# Patient Record
Sex: Male | Born: 1955 | Race: Black or African American | Hispanic: No | Marital: Married | Smoking: Never smoker
Health system: Southern US, Community
[De-identification: ages and names within clinical notes are randomized; demographics above are authoritative.]

## PROBLEM LIST (undated history)

## (undated) DIAGNOSIS — I10 Essential (primary) hypertension: Secondary | ICD-10-CM

## (undated) DIAGNOSIS — I669 Occlusion and stenosis of unspecified cerebral artery: Secondary | ICD-10-CM

## (undated) DIAGNOSIS — C189 Malignant neoplasm of colon, unspecified: Secondary | ICD-10-CM

## (undated) HISTORY — PX: CORONARY ARTERY BYPASS GRAFT: SHX141

---

## 2010-07-20 ENCOUNTER — Emergency Department (HOSPITAL_COMMUNITY): Payer: Medicaid Other

## 2010-07-20 ENCOUNTER — Emergency Department (HOSPITAL_COMMUNITY)
Admission: EM | Admit: 2010-07-20 | Discharge: 2010-07-21 | Disposition: A | Discharge status: Nursing Home (Includes ICF) | Payer: Medicaid Other | Source: Home / Self Care | Attending: Emergency Medicine | Admitting: Emergency Medicine

## 2010-07-20 LAB — BASIC METABOLIC PANEL
CO2: 26 mEq/L (ref 19–32)
Calcium: 9.5 mg/dL (ref 8.4–10.5)
GFR calc Af Amer: 46 mL/min — ABNORMAL LOW (ref >60)
GFR calc non Af Amer: 38 mL/min — ABNORMAL LOW (ref >60)
Sodium: 130 mEq/L — ABNORMAL LOW (ref 135–145)

## 2010-07-20 LAB — CK TOTAL AND CKMB (NOT AT ARMC)
CK, MB: 3.3 ng/mL (ref 0.3–4.0)
Total CK: 505 U/L — ABNORMAL HIGH (ref 7–232)

## 2010-07-20 LAB — CBC
MCH: 27.6 pg (ref 26.0–34.0)
Platelets: 212 10*3/uL (ref 150–400)
RBC: 4.38 MIL/uL (ref 4.22–5.81)

## 2010-07-20 LAB — DIFFERENTIAL
Basophils Relative: 0 % (ref 0–1)
Eosinophils Absolute: 0 10*3/uL (ref 0.0–0.7)
Monocytes Relative: 16 % — ABNORMAL HIGH (ref 3–12)
Neutrophils Relative %: 74 % (ref 43–77)

## 2010-07-20 LAB — PRO B NATRIURETIC PEPTIDE: Pro B Natriuretic peptide (BNP): 4124 pg/mL — ABNORMAL HIGH (ref 0–125)

## 2010-07-20 MED ORDER — IOHEXOL 300 MG/ML  SOLN
100.0000 mL | Freq: Once | INTRAMUSCULAR | Status: AC | PRN
  Administered 2010-07-20: 100 mL via INTRAVENOUS

## 2010-07-21 ENCOUNTER — Inpatient Hospital Stay (HOSPITAL_COMMUNITY): Payer: Medicaid Other

## 2010-07-21 ENCOUNTER — Inpatient Hospital Stay (HOSPITAL_COMMUNITY)
Admission: AD | Admit: 2010-07-21 | Discharge: 2010-08-03 | DRG: 194 | Disposition: A | Discharge status: Home / Self Care | Payer: Medicaid Other | Source: Other Acute Inpatient Hospital | Attending: Internal Medicine | Admitting: Internal Medicine

## 2010-07-21 DIAGNOSIS — I712 Thoracic aortic aneurysm, without rupture, unspecified: Secondary | ICD-10-CM | POA: Diagnosis present

## 2010-07-21 DIAGNOSIS — N183 Chronic kidney disease, stage 3 unspecified: Secondary | ICD-10-CM | POA: Diagnosis present

## 2010-07-21 DIAGNOSIS — Z86718 Personal history of other venous thrombosis and embolism: Secondary | ICD-10-CM

## 2010-07-21 DIAGNOSIS — Z9119 Patient's noncompliance with other medical treatment and regimen: Secondary | ICD-10-CM

## 2010-07-21 DIAGNOSIS — I129 Hypertensive chronic kidney disease with stage 1 through stage 4 chronic kidney disease, or unspecified chronic kidney disease: Secondary | ICD-10-CM | POA: Diagnosis present

## 2010-07-21 DIAGNOSIS — F17200 Nicotine dependence, unspecified, uncomplicated: Secondary | ICD-10-CM | POA: Diagnosis present

## 2010-07-21 DIAGNOSIS — E876 Hypokalemia: Secondary | ICD-10-CM | POA: Diagnosis present

## 2010-07-21 DIAGNOSIS — Z880 Allergy status to penicillin: Secondary | ICD-10-CM

## 2010-07-21 DIAGNOSIS — J189 Pneumonia, unspecified organism: Principal | ICD-10-CM | POA: Diagnosis present

## 2010-07-21 DIAGNOSIS — I7101 Dissection of thoracic aorta: Secondary | ICD-10-CM

## 2010-07-21 DIAGNOSIS — Z91199 Patient's noncompliance with other medical treatment and regimen due to unspecified reason: Secondary | ICD-10-CM

## 2010-07-21 LAB — CARDIAC PANEL(CRET KIN+CKTOT+MB+TROPI)
Relative Index: 0.7 (ref 0.0–2.5)
Relative Index: 0.8 (ref 0.0–2.5)
Relative Index: 0.9 (ref 0.0–2.5)
Total CK: 443 U/L — ABNORMAL HIGH (ref 7–232)
Troponin I: <0.3 ng/mL (ref <0.30)
Troponin I: <0.3 ng/mL (ref <0.30)
Troponin I: <0.3 ng/mL (ref <0.30)

## 2010-07-21 LAB — COMPREHENSIVE METABOLIC PANEL
ALT: 15 U/L (ref 0–53)
AST: 25 U/L (ref 0–37)
Albumin: 2.6 g/dL — ABNORMAL LOW (ref 3.5–5.2)
CO2: 25 mEq/L (ref 19–32)
Calcium: 8.9 mg/dL (ref 8.4–10.5)
Chloride: 98 mEq/L (ref 96–112)
GFR calc non Af Amer: 53 mL/min — ABNORMAL LOW (ref >60)
Sodium: 136 mEq/L (ref 135–145)
Total Bilirubin: 0.3 mg/dL (ref 0.3–1.2)

## 2010-07-21 LAB — CBC
HCT: 31.9 % — ABNORMAL LOW (ref 39.0–52.0)
MCH: 28.4 pg (ref 26.0–34.0)
MCHC: 34.8 g/dL (ref 30.0–36.0)
MCV: 81.6 fL (ref 78.0–100.0)
RDW: 18 % — ABNORMAL HIGH (ref 11.5–15.5)

## 2010-07-21 LAB — LIPID PANEL
Cholesterol: 113 mg/dL (ref 0–200)
HDL: 43 mg/dL (ref >39)
Triglycerides: 67 mg/dL (ref <150)

## 2010-07-22 LAB — COMPREHENSIVE METABOLIC PANEL
ALT: 15 U/L (ref 0–53)
AST: 24 U/L (ref 0–37)
Albumin: 2.7 g/dL — ABNORMAL LOW (ref 3.5–5.2)
Alkaline Phosphatase: 83 U/L (ref 39–117)
BUN: 23 mg/dL (ref 6–23)
Chloride: 97 mEq/L (ref 96–112)
Potassium: 2.9 mEq/L — ABNORMAL LOW (ref 3.5–5.1)
Sodium: 136 mEq/L (ref 135–145)
Total Bilirubin: 0.3 mg/dL (ref 0.3–1.2)
Total Protein: 6.2 g/dL (ref 6.0–8.3)

## 2010-07-22 LAB — CBC
HCT: 31.1 % — ABNORMAL LOW (ref 39.0–52.0)
RDW: 18.1 % — ABNORMAL HIGH (ref 11.5–15.5)
WBC: 11 10*3/uL — ABNORMAL HIGH (ref 4.0–10.5)

## 2010-07-22 LAB — MAGNESIUM: Magnesium: 2.3 mg/dL (ref 1.5–2.5)

## 2010-07-23 LAB — BASIC METABOLIC PANEL
BUN: 22 mg/dL (ref 6–23)
CO2: 27 mEq/L (ref 19–32)
Calcium: 8.8 mg/dL (ref 8.4–10.5)
Chloride: 100 mEq/L (ref 96–112)
Creatinine, Ser: 2.01 mg/dL — ABNORMAL HIGH (ref 0.4–1.5)
Glucose, Bld: 112 mg/dL — ABNORMAL HIGH (ref 70–99)

## 2010-07-24 LAB — BASIC METABOLIC PANEL
CO2: 27 mEq/L (ref 19–32)
Calcium: 8.9 mg/dL (ref 8.4–10.5)
GFR calc Af Amer: 43 mL/min — ABNORMAL LOW (ref >60)
GFR calc non Af Amer: 36 mL/min — ABNORMAL LOW (ref >60)
Sodium: 134 mEq/L — ABNORMAL LOW (ref 135–145)

## 2010-07-25 ENCOUNTER — Inpatient Hospital Stay (HOSPITAL_COMMUNITY): Payer: Medicaid Other

## 2010-07-25 LAB — BASIC METABOLIC PANEL
CO2: 28 mEq/L (ref 19–32)
Chloride: 99 mEq/L (ref 96–112)
Sodium: 133 mEq/L — ABNORMAL LOW (ref 135–145)

## 2010-07-25 LAB — CBC
Platelets: 266 10*3/uL (ref 150–400)
RBC: 3.52 MIL/uL — ABNORMAL LOW (ref 4.22–5.81)
WBC: 11.2 10*3/uL — ABNORMAL HIGH (ref 4.0–10.5)

## 2010-07-26 LAB — BASIC METABOLIC PANEL
BUN: 14 mg/dL (ref 6–23)
CO2: 28 mEq/L (ref 19–32)
Calcium: 8.8 mg/dL (ref 8.4–10.5)
Chloride: 96 mEq/L (ref 96–112)
Creatinine, Ser: 1.57 mg/dL — ABNORMAL HIGH (ref 0.50–1.35)
GFR calc Af Amer: 56 mL/min — ABNORMAL LOW (ref >60)
GFR calc non Af Amer: 46 mL/min — ABNORMAL LOW (ref >60)
Glucose, Bld: 106 mg/dL — ABNORMAL HIGH (ref 70–99)
Potassium: 3.6 mEq/L (ref 3.5–5.1)
Sodium: 131 mEq/L — ABNORMAL LOW (ref 135–145)

## 2010-07-26 NOTE — H&P (Signed)
NAMEMarland Kitchen  SERAPHIM, TROW NO.:  [id number]  MEDICAL RECORD NO.:  [id number]  LOCATION:  WLED                         FACILITY:  [facility]  PHYSICIAN:  Tarry Kos, MD       DATE OF BIRTH:  [birth year]  DATE OF ADMISSION:  07/20/2010 DATE OF DISCHARGE:                             HISTORY & PHYSICAL   CHIEF COMPLAINT:  Cough.  HISTORY OF PRESENT ILLNESS:  Mr. Escamilla is a 55 year old male who has a longstanding history of malignant hypertension for several years and also a history of an aortic aneurysm that he is known about also for over 10 years who presents to emergency department because of productive cough for 1 week.  He has not been having any chest pain.  He denies any respiratory issues except productive cough, again it has been going on for week.  He came to the emergency department to have his cough addressed because his wife thought he might have pneumonia.  He was diagnosed with pneumonia but on chest x-ray he had a significant finding of a wide a wide mediastinum at which point a CTA of his chest was done which showed aortic aneurysm with dissection.  Surprisingly Mr. Mangan is not having any chest pain.  He says that he has been known about his blood pressure, his systolics when he came in to the emergency room today was 230 with a diastolic of 145.  He says he has been known about his blood pressure problems for years but tried holistic medicine approach over 5 years ago at which point he stopped taking all traditional medications and he understands that the holistic approach is not working for him.  He also was told that he had aortic issue but has failed to follow up on that.  Again he is not have any chest pain.  He does have daily headaches which is likely due to his uncontrolled blood pressure.  Other than that, he denies any shortness of breath, denies any pleuritic chest pain, denies running any fevers.  The only complaint he has had is a productive  cough for a week.  PAST MEDICAL HISTORY: 1. Untreated malignant hypertension for many, many years. 2. Aortic aneurysm that he is known about for many years.  SOCIAL HISTORY:  He denies any drugs.  He does say he smokes. Occasional alcohol use.  He is married.  MEDICATIONS:  None.  FAMILY HISTORY:  He says his mom had the same problem with her aorta.  REVIEW OF SYSTEMS:  Otherwise negative.  PHYSICAL EXAMINATION:  VITAL SIGNS:  His temperature is 100, blood pressure 221/145 initially and is currently 187/139, pulse 125, respirations 20, 96% O2 sats on room air. GENERAL:  He is alert and oriented x4.  No apparent distress, cooperative and friendly. HEENT: Extraocular movements intact.  Pupils equal to light. Oropharynx, mucous membranes moist. NECK:  No JVD.  No carotid bruits. COR:  Regular rate and rhythm without murmurs or gallops. CHEST:  Clear to auscultation bilaterally.  No wheezes or rales. ABDOMEN:  Soft, nontender, nondistended.  Positive bowel sounds.  No hepatosplenomegaly. EXTREMITIES:  No clubbing, cyanosis or edema. PSYCH:  Normal affect.  NEURO:  No focal neurologic deficits. SKIN:  No rashes.  LABORATORY DATA:  His BUN and creatinine are elevated at 23 and 1.87, potassium is 2.8.  Sodium is 130.  His white count is 14.3, hemoglobin is 12.1.  Cardiac enzymes negative.  Pro-BNP 4000.  Chest x-ray with right lower lobe pneumonia.  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  ASSESSMENT AND PLAN:  This is a 55 year old male who presents with cough who was also incidentally found to have significant aorta abnormalities. 1. Community-acquired pneumonia.  We will place him on Avelox 400 mg     IV q.24 h. 2. Malignant hypertensive emergency.  I am going to place him on     metoprolol 50 mg twice a day, his first dose now; nitroglycerin     paste  1 inch every 8 hours to chest wall; and hydralazine 10 mg IV     every 2 hours as needed for systolic blood pressure over 170,     diastolic blood pressure over 110.  I have written parameters to     call if his systolic is over 161 so we can add antihypertensive     medications if needed.  Also we will obtain a 2-D echo. 3. Significant aortic aneurysm with dissection.  I am surprised that     Mr. Alcocer is not more symptomatic of this.  The emergency room     physician has called CT surgery.  I am going to place him n.p.o.     overnight in case they would want to proceed with surgical repair.     I have told Mr. Melhorn that he will likely need surgery to repair     this and he really does not know if he wants surgery.  I have asked     him to keep an open mind, at least hear what the surgeon has to     say.  He is aware that this could be a potentially life threatening     issue if this is not repaired.  He understands that.  We will try     to control his blood pressure, monitor closely in the step-down     unit. 4. Acute renal failure.  Unknown baseline creatinine.  We will place     him on IV fluids overnight and obtain a bilateral renal ultrasound     regarding his renal failure and I would not be surprised if he has     got aneurysms in his renal arteries causing renal failure.  He     might need an angiogram of his abdomen to fully assess that issue. 5. We will hold off on any aspirin or Lovenox at this time in case he     needs to go to surgery in the next 24 to 48 hours. 6. The patient is full code.  Further recommendations pending overall     hospital course.          ______________________________ Tarry Kos, MD     RD/MEDQ  D:  07/21/2010  T:  07/21/2010  Job:  [id number]  Electronically Signed by Tarry Kos MD on 07/26/2010 07:54:21 PM

## 2010-07-26 NOTE — H&P (Signed)
  NAMEMarland Kitchen  SIMS, LADAY NO.:  [id number]  MEDICAL RECORD NO.:  [id number]  LOCATION:  WLED                         FACILITY:  [facility]  PHYSICIAN:  Tarry Kos, MD       DATE OF BIRTH:  [birth year]  DATE OF ADMISSION:  07/20/2010 DATE OF DISCHARGE:                             HISTORY & PHYSICAL   ADDENDUM: Mr. Ettinger is being transferred to [facility] where there is CT surgery available as that specialty is not available here at [facility].  I think this dissection is probably chronic; however, to be on the safe side, we will transfer him over to [facility] in case emergency need arises for surgical repair.  He is currently stable.  I have spoken to the mid level covering tonight in [place] to check on his blood pressure in the next couple of hours and adjust his antihypertensive medications as needed.  He is going to step-down unit to [facility] to [facility].          ______________________________ Tarry Kos, MD     RD/MEDQ  D:  07/21/2010  T:  07/21/2010  Job:  [id number]  Electronically Signed by Tarry Kos MD on 07/26/2010 07:54:25 PM

## 2010-07-27 ENCOUNTER — Inpatient Hospital Stay (HOSPITAL_COMMUNITY): Payer: Medicaid Other

## 2010-07-28 LAB — BASIC METABOLIC PANEL
BUN: 20 mg/dL (ref 6–23)
Chloride: 97 mEq/L (ref 96–112)
GFR calc Af Amer: 53 mL/min — ABNORMAL LOW (ref >60)
Glucose, Bld: 127 mg/dL — ABNORMAL HIGH (ref 70–99)
Potassium: 3.6 mEq/L (ref 3.5–5.1)
Sodium: 134 mEq/L — ABNORMAL LOW (ref 135–145)

## 2010-07-28 NOTE — Group Therapy Note (Signed)
NAMEDEAVIN, [name] NO.:  [id number]  MEDICAL RECORD NO.:  [id number]  LOCATION:  3715                         FACILITY:  [facility]  PHYSICIAN:  Lonia Blood, M.D.DATE OF BIRTH:  May 11, 1955                                PROGRESS NOTE   PRIMARY CARE PHYSICIAN: Newly assigned, [facility].  CONSULTING PHYSICIAN: Evelene Croon, MD with Thoracic Surgery.  ACTIVE PROBLEMS AT THE PRESENT TIME: 1. Right lower lobe and left upper lobe community-acquired pneumonia.     a.     Follow up on chest x-ray are revealing improvement.     b.     To complete a 7-day course of empiric antibiotic therapy.     c.     Clinically much improved. 2. Malignant hypertension.     a.     In the setting of below describe severe vascular disease.     b.     Acute control indicated and accomplished via IV beta-blocker      drip.     c.     Blood pressure stabilizing on multiple oral agents at the      present time. 3. Right-sided aortic arch. 4. 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     a.     CT surgery consulted during this hospital stay.     b.     Strict blood pressure control indicated from the medical      side.     c.     Thoracic Surgery to arrange followup appointment at [facility] in the [facility] prior to discharge. 5. Hypokalemia - resolved. 6. Chronic renal insufficiency.     a.     Baseline unclear due to noncompliance with medical followup.     b.     Creatinine stable during this hospital stay at approximately      1.6. 7. Noncompliance with medications - the patient educated on extreme     risk of acute death in the setting of aortic arch, unstable     aneurysm, and dissection with ongoing medical noncompliance. 8. Tobacco abuse.     a.     The patient educated on linked between vascular disease and      ongoing tobacco abuse.     b.     Tobacco cessation consultation carried out during hospital  stay. 9. IV infiltrations with forearm edema, bilateral upper extremities.     a.     Improving on physical exam today.     b.     No evidence at the present time of thrombophlebitis or      compartment syndrome as a complicating factor.  CONSULTATIONS: Evelene Croon, MD with cardiac and thoracic surgery.  PROCEDURES: 1. CT angio of the chest on July 21, 2010 - innominate artery     dissection.  Saccular aneurysm of the transverse aorta.  Small     blind-ending dissection of the descending aorta.  Cardiomegaly with     LVH.  Right lower lobe infectious process.  Apparent left     subclavian artery.  We  suggest that the patient's dissection is     actually in the innominate artery.  There is significant     atherosclerotic calcification of the left subclavian with stenosis     and a poststenotic dilatation. 2. Renal ultrasound on July 21, 2010, echogenic renal parenchyma     consistent with history of renal failure.  No focal renal mass or     hydronephrosis. 3. Transthoracic echocardiogram on July 21, 2010.  The left ventricle     revealed severe concentric wall thickness consistent with     hypertensive heart disease.  Ejection fraction greater than 70%.     Grade 1 diastolic dysfunction appreciated.  Left atrium was mildly     dilated.  The right atrium was mildly dilated.  HOSPITAL COURSE: [name] is a pleasant 55 year old gentleman, who presented originally to [facility] on July 20, 2010, with complaints of cough.  During his emergency room evaluation, a chest x- ray revealed evidence of a right lower lobe community-acquired pneumonia.  Additionally, the patient was found to have a blood pressure of 221/145.  Chest x-ray also revealed evidence of a wide mediastinum. This prompted a CT angio of the chest.  Results of the CT angio were as discussed above.  In the setting of malignant hypertension and severe thoracic vascular disease, the decision was  made to transfer the patient to [facility] so that he could be closer to the Thoracic Surgery Service should acute intervention be required.  The patient was transferred to [facility] successfully.  Dr. Evelene Croon with cardiac and thoracic surgery was consulted.  Dr. Laneta Simmers reviewed the patient's angio and also examined the patient.  He discussed the patient's care with the Thoracic Surgery Service at [facility]. Ultimately decision was made that the patient would be best evaluated at a large [facility] center such as [facility] to consider surgical correction of his vascular disease.  It is not felt, however, that this requires an acute transfer and rather the Thoracic Surgery Service plans to arrange this followup prior to the patient's discharge from [facility].  The primary medical issue during his the patient's hospital stay actually proved to be control of his malignant hypertension.  The patient required labetalol drip for quite some time.  Multiple oral medications were titrated in the face of the labetalol drip.  The patient's blood pressure proved to be quite refractory to control. Fortunately, at the time of this dictation, the patient's blood pressure has significantly improved.  We wish to monitor the patient for an additional 24 hours.  If his blood pressure remains within a stable range (specifically a systolic less than 140 and a diastolic less than 100), he will be deemed medically stable for discharge.  The patient has been educated extensively as to the absolute requirement for medical compliance and the very real risk of aortic rupture and rapid death should he not comply with his medications and suffer a severe hypertensive crisis again.  The patient responded well to treatment for his community-acquired pneumonia.  During the patient's hospital course, actually proved to reveal a second infiltrate in the base of the left upper lobe  as well. Nonetheless, the patient progressed nicely from a clinical standpoint. At the time of this dictation, he is on oral antibiotic and tolerating this well.  O2 sats are stable on room air and he is in no respiratory distress.  As noted above, the patient suffered IV infiltrations  in both forearms during his hospital stay.  He had a propensity for losing IV access.  At this time, we are monitoring the patient's forearm.  There is a 1+ radial pulse of both wrists.  There is no paresthesia or paralysis of the hands or fingers.  There is no severe pain in the extremities. There are no palpable cords or areas of fluctuance or severe erythema. At this time, no acute intervention is felt to likely be required.  At the present time, the patient is not yet felt to be stable for discharge from the hospital.  We will need to follow his blood pressure closely on the telemetry unit on his current oral medication regimen. We will also need to follow his forearms to assure they are completely resolved.  At such time that these two goals are accomplished, we would need to assure that the patient has his followup appointment arranged at [facility] in the A[facility] Clinic.  He has already been referred to [facility] for followup of his blood pressure.     Lonia Blood, M.D.     JTM/MEDQ  D:  07/27/2010  T:  07/27/2010  Job:  [id number]  Electronically Signed by Jetty Duhamel M.D. on 07/28/2010 12:14:57 PM

## 2010-07-29 DIAGNOSIS — M7989 Other specified soft tissue disorders: Secondary | ICD-10-CM

## 2010-07-29 LAB — BASIC METABOLIC PANEL
BUN: 23 mg/dL (ref 6–23)
CO2: 30 mEq/L (ref 19–32)
Chloride: 97 mEq/L (ref 96–112)
Creatinine, Ser: 1.7 mg/dL — ABNORMAL HIGH (ref 0.50–1.35)
GFR calc Af Amer: 51 mL/min — ABNORMAL LOW (ref >60)
Glucose, Bld: 95 mg/dL (ref 70–99)
Potassium: 3.8 mEq/L (ref 3.5–5.1)

## 2010-07-29 LAB — DIFFERENTIAL
Eosinophils Relative: 3 % (ref 0–5)
Lymphocytes Relative: 25 % (ref 12–46)
Lymphs Abs: 2.1 10*3/uL (ref 0.7–4.0)
Neutro Abs: 4.4 10*3/uL (ref 1.7–7.7)

## 2010-07-29 LAB — CBC
HCT: 31.2 % — ABNORMAL LOW (ref 39.0–52.0)
Hemoglobin: 11 g/dL — ABNORMAL LOW (ref 13.0–17.0)
MCV: 82.1 fL (ref 78.0–100.0)
RDW: 17.4 % — ABNORMAL HIGH (ref 11.5–15.5)
WBC: 8.2 10*3/uL (ref 4.0–10.5)

## 2010-07-30 LAB — PHOSPHORUS: Phosphorus: 3.6 mg/dL (ref 2.3–4.6)

## 2010-07-30 LAB — BASIC METABOLIC PANEL
BUN: 24 mg/dL — ABNORMAL HIGH (ref 6–23)
CO2: 31 mEq/L (ref 19–32)
Chloride: 98 mEq/L (ref 96–112)
Glucose, Bld: 89 mg/dL (ref 70–99)
Potassium: 3.9 mEq/L (ref 3.5–5.1)
Sodium: 137 mEq/L (ref 135–145)

## 2010-07-31 DIAGNOSIS — M7989 Other specified soft tissue disorders: Secondary | ICD-10-CM

## 2010-07-31 LAB — BASIC METABOLIC PANEL
CO2: 31 mEq/L (ref 19–32)
Chloride: 99 mEq/L (ref 96–112)
Glucose, Bld: 110 mg/dL — ABNORMAL HIGH (ref 70–99)
Potassium: 4 mEq/L (ref 3.5–5.1)
Sodium: 136 mEq/L (ref 135–145)

## 2010-07-31 LAB — PHOSPHORUS: Phosphorus: 4.4 mg/dL (ref 2.3–4.6)

## 2010-07-31 LAB — MAGNESIUM: Magnesium: 2.6 mg/dL — ABNORMAL HIGH (ref 1.5–2.5)

## 2010-08-01 LAB — BASIC METABOLIC PANEL
CO2: 32 mEq/L (ref 19–32)
Calcium: 8.8 mg/dL (ref 8.4–10.5)
Chloride: 100 mEq/L (ref 96–112)
Creatinine, Ser: 1.45 mg/dL — ABNORMAL HIGH (ref 0.50–1.35)
GFR calc Af Amer: >60 mL/min (ref >60)
Sodium: 139 mEq/L (ref 135–145)

## 2010-08-01 LAB — MAGNESIUM: Magnesium: 2.5 mg/dL (ref 1.5–2.5)

## 2010-08-01 LAB — PHOSPHORUS: Phosphorus: 3.7 mg/dL (ref 2.3–4.6)

## 2010-08-08 NOTE — Discharge Summary (Signed)
[name], [name] NO.:  [id number]  MEDICAL RECORD NO.:  [id number]  LOCATION:  3715                         FACILITY:  [facility]  PHYSICIAN:  Rock Nephew, MD       DATE OF BIRTH:  11-11-1955  DATE OF ADMISSION:  07/21/2010 DATE OF DISCHARGE:                        DISCHARGE SUMMARY - REFERRING   DATE OF DISCHARGE:  To be determined.  PRIMARY CARE PHYSICIAN:  Newly assigned to [facility].  DISCHARGE DIAGNOSES: 1. Right lower lobe and left upper lobe pneumonia. 2. Malignant resistant hypertension. 3. 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 4. Hypokalemia resolved. 5. Chronic renal insufficiency, chronic kidney disease stage III,     initially questionable right subclavian deep venous thrombosis, but     new Doppler shows no deep venous thrombosis. 6. History of bilateral upper extremity right basilic and left basilic     vein acute superficial thrombus.  DISCHARGE MEDICATIONS:  Will be dictated at the time of discharge; we are trying optimize the patient's blood pressure medications and is trying to optimize that based on affordability.  Apparently, the patient will be on; 1. Clonidine 0.2 mg p.o. b.i.d. 2. Doxazosin 4 mg p.o. daily. 3. Lisinopril 20 mg p.o. b.i.d. 4. Metoprolol tartrate 200 mg p.o. b.i.d.  PROCEDURES PERFORMED:  A renal ultrasound on July 21, 2010 showed echogenic renal parenchyma, consistent with history of renal failure. No focal renal mass or hydronephrosis.  CT angiogram of the chest on July 21, 2010 showed innominate artery dissection.  The patient had 2-D echocardiogram on July 21, 2010 which showed a left ventricle ejection fraction of 70%.  Doppler parameters were consistent with grade 1 diastolic dysfunction.  FOLLOWUP:  The patient should follow up with primary care physician at [facility].  Appointment  is scheduled on August 21, 2010.  The patient should also follow up with Dr. Kizzie Bane at [facility] in 1 to 2 weeks. This is going to set up by Dr. Rexanne Mano.  CONSULTANTS ON THE CASE:  Have been as follows; Dr. Rexanne Mano, and also verbal consult with Dr. Hart Rochester, Vascular Surgery.  BRIEF HISTORY OF PRESENT ILLNESS:  Chief complaint was cough 55 year old male with history of malignant hypertension.  Aortic aneurysm that he is known about 410 years of persistent emergency department with a productive cough.  The patient came to the hospital.  He was treated with antibiotics.  His blood pressure in the emergency department was systolic 230, diastolic 145.  It is brought into the hospital and discovered to have the aortic dissection.  HOSPITAL COURSE: 1. Aortic arch aneurysm.  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  The patient was admitted to the     hospital.  The patient was initially placed on the step-down ICU     units and the patient was placed on IV antihypertensives.  Later,     the patient was switched over to oral antihypertensives.  The     patient was seen in consultation with Dr. Evelene Croon and he spoke  to Dr. Kizzie Bane at [facility] and Dr. Laneta Simmers     thought this was a complex surgery.  Case should be done at a     specialized [facility] and is recommended the patient can     be discharged once the blood pressure is controlled have follow-up     with Dr. Kizzie Bane at the [facility]. 2. Right lower lobe and left upper lobe pneumonia.  The patient was     admitted.  The patient was treated with antibiotics.  The patient     received a long course of antibiotics including his off antibiotics     right now for the pneumonia.  The pneumonia has improved. 3. Malignant resistant hypertension.  The patient's blood pressure has     been very difficult to control.  The patient's blood pressure was      finally controlled on the regimen of 10 mg amlodipine as well as 25     mg chlorthalidone and clonidine patch, hydralazine, lisinopril and     high-dose metoprolol; however, this regimen will cause the patient     approximately $1100 per month and there is no way that the patient     will be able to afford this regimen, so as a result I am still in     the process of transitioning the patient over to [employer] $4 list     and put him on a group of medicines that he can afford.  I do not     believe that the patient is safe for discharge till his blood     pressure is controlled and a portable medication that he can take     as the patient is at high risk for complete dissection and aneurysm     rupture.  If his blood pressure was not controlled, which could     result in immediate death. 4. Hypokalemia.  The patient had some hypokalemia which has since     resolved, chronic renal insufficiency.  The patient has baseline     stage III chronic kidney disease.  The patient's creatinine has     improved to 1.50. 5. Questionable right subclavian DVT.  Initially, it was thought that     the patient had a right subclavian DVT based on Doppler studies.     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     Initially, it was discussed with Dr. Laneta Simmers and tells there was a     questionably DVT in the beginning and we have decided not to start     the patient on any kind of anticoagulation.  We will repeat Doppler     of the right upper extremity.  Luckily, at this time, he did not     see DVT.     Rock Nephew, MD     NH/MEDQ  D:  07/31/2010  T:  07/31/2010  Job:  [id number]  cc:   Evelene Croon, M.D. [facility] Vascular Thoracic Surgery Dr. Kizzie Bane  Electronically Signed by Rock Nephew MD on 08/08/2010 11:57:13 PM

## 2010-08-08 NOTE — Group Therapy Note (Signed)
  NAMEMarland [name]  DVON, JILES NO.:  [id number]  MEDICAL RECORD NO.:  [id number]  LOCATION:  3715                         FACILITY:  [facility]  PHYSICIAN:  Rock Nephew, MD       DATE OF BIRTH:  03/16/1955                                PROGRESS NOTE   The patient's official report of the upper extremity Doppler came back today, July 30, 2010, with the findings were consistent with a questionable acute DVT involving the right upper extremity in the right subclavian vein.  I discussed these findings again with Dr. Laneta Simmers and he felt that the patient's DVT was still questionable.  He was skeptical about the DVT.  He felt that the patient does not have a definite DVT that anticoagulation should not be started.  He also told me that he would not pursue a venogram at this time.  The patient's comorbidities are chronic kidney disease with a creatinine of 1.65.     Rock Nephew, MD     NH/MEDQ  D:  07/30/2010  T:  07/30/2010  Job:  [id number]  Electronically Signed by Rock Nephew MD on 08/08/2010 11:57:42 PM

## 2010-08-08 NOTE — Group Therapy Note (Signed)
  NAMEMarland Kitchen  NAI, DASCH NO.:  [id number]  MEDICAL RECORD NO.:  [id number]  LOCATION:  3715                         FACILITY:  [facility]  PHYSICIAN:  Rock Nephew, MD       DATE OF BIRTH:  1955-06-09                                PROGRESS NOTE   [name] has a preliminary report on bilateral upper extremity Dopplers which is showing a right questionable DVT of the proximal subclavian and also superficial thrombus of the basilic vein above the IV on the right arm.  The patient also has no evidence of DVT on the left upper extremity, but there are the superficial thrombus basilic above the [facility] and cephalic below the [facility].  The patient is noted to have an aortic aneurysm and aortic dissection.  I discussed this with both Dr. Laneta Simmers and Dr. Hart Rochester of Vascular Surgery.  Dr. Laneta Simmers and Dr. Hart Rochester both told me that the patient should be anticoagulated but only if you are absolutely sure that there is a DVT so for the time being, we are waiting for the official report of the bilateral upper extremity Dopplers and if there is a DVT, the patient will be anticoagulated with heparin and Coumadin with a goal of 2.0.     Rock Nephew, MD     NH/MEDQ  D:  07/29/2010  T:  07/29/2010  Job:  [id number]  Electronically Signed by Rock Nephew MD on 08/08/2010 11:57:34 PM

## 2010-08-11 NOTE — Consult Note (Signed)
[name], FULOP NO.:  [id number]  MEDICAL RECORD NO.:  [id number]  LOCATION:  2914                         FACILITY:  [facility]  PHYSICIAN:  Evelene Croon, M.D.     DATE OF BIRTH:  1955/12/02  DATE OF CONSULTATION:  07/21/2010 DATE OF DISCHARGE:                                CONSULTATION   REFERRING PHYSICIAN:  Tarry Kos, MD  REASON FOR CONSULTATION:  Aortic aneurysm with dissection.  CLINICAL HISTORY:  I was asked by Dr. Onalee Hua to evaluate 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  He is a 55 year old [place] gentleman who has a long history of malignant hypertension as he has not been taking any medications for as well as a known history of a right aortic arch.  He presented to [facility] overnight with a 1-week history of productive cough and congestion as well as subjective fever.  He states he has been coughing up yellow sputum.  He denies any chills.  He has had no hemoptysis.  He has had mild shortness of breath.  He denies any chest or back pain.  In the emergency room, he was noted to have a white blood cell count of 14.3 with temperature of 100.  His blood pressure was elevated 221/145 initially.  His creatinine was 1.87.  BNP was over 4000 with negative cardiac enzymes.  He had a chest x-ray performed which showed the right-sided aortic arch with marked dilatation of the aorta.  There was cardiac enlargement.  Lungs showed emphysematous changes with scattered fibrosis in the lungs and probable pleural thickening.  Due to the wide mediastinum, he had a CT angiogram of the chest performed which I reviewed.  This showed a right- sided aortic arch.  The ascending aorta was ectatic with a maximum diameter of about 4.1 cm.  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  The descending aorta was a large about 4.4 cm with a limited blind  ending dissection flap in the descending aorta over about 5.4 cm in length.  At the diaphragm, the aorta narrowed back down to about 3.3 cm.  There appeared to be an aberrant left subclavian artery coming off an area of the saccular aneurysm and coursing posterior to the esophagus.  The heart appeared enlarged with left ventricular hypertrophy.  There is a right lower lobe pneumonia.  Images of the upper abdomen were degraded due to patient motion and abdominal CT scan was not performed.  The patient was admitted and transferred to [facility] for treatment of his pneumonia and severe hypertension.  REVIEW OF SYSTEMS:  GENERAL:  He does report some subjective fever at home, has not been checking his temperature.  Denies chills.  He has had no recent weight changes.  He denies fatigue.  EYES:  He does wear glasses, but has had no recent visual changes.  ENT:  Negative. ENDOCRINE:  He denies diabetes and hypothyroidism.  CARDIOVASCULAR:  He denies any chest pain or pressure.  He has had some intermittent back pains in the past but nothing recently.  He  denies PND and orthopnea. He has had some recent exertional dyspnea over the past week.  He denies palpitations and peripheral edema.  RESPIRATORY:  He has had productive cough of yellow sputum.  Denies hemoptysis.  GI:  He denies nausea or vomiting.  Denies dysphagia.  He denies melena and bright red blood per rectum.  GU:  He denies dysuria and hematuria.  MUSCULOSKELETAL:  He denies arthralgias and myalgias.  NEUROLOGIC:  He has had no focal weakness or numbness.  He denies dizziness and syncope.  He has never had a TIA or stroke.  PSYCHIATRIC:  Negative.  HEMATOLOGIC:  He has had no history of bleeding disorders or easy bleeding.  ALLERGIES:  None.  MEDICATIONS:  None.  PAST MEDICAL HISTORY:  He has a history of uncontrolled hypertension. He said that he has not taken blood pressure medicines because they cause impotence.  He had an  episode of pneumonia and ARDS requiring mechanical ventilation for a couple of weeks in 2003.  This was treated in [place] where he lived previously.  He underwent left lower extremity fasciotomy for compartment syndrome in the 1990s of unclear etiology.  He presented with painful swelling in his left lower leg.  SOCIAL HISTORY:  He is married and lives with his wife.  He lived in [place] for the past 20 years where he worked as a Aeronautical engineer doing CAD Estate agent.  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  He was born in [place] and is living in [place] for at least the last 20 years.  He smokes couple of cigarettes a day.  He denies any drug use. Drinks occasional alcohol.  FAMILY HISTORY:  His dad died with diabetes and severe hypertension. His mother is in her 2s and alive and well.  She also has a right aortic arch.  PHYSICAL EXAMINATION:  VITAL SIGNS:  Blood pressure was 175/115, pulse is 90 and regular, respiratory rate is 18 unlabored. GENERAL:  He is a well-developed, small-framed African American male in no distress. HEENT:  Normocephalic and atraumatic.  Pupils are equal and reactive to light.  Extraocular muscles are intact.  Oropharynx is clear. NECK:  Normal carotid pulses.  There are no bruits.  There is no adenopathy or thyromegaly. CARDIAC:  Regular rate and rhythm with normal S1 and S2.  There is no murmur, rub or gallop. LUNGS:  Crackles over his right lower lobe.  There are few rhonchi in the right lung. ABDOMEN:  Active bowel sounds.  His abdomen is soft and nontender. There are no palpable masses or organomegaly. EXTREMITIES:  Strong palpable radial pulses bilaterally.  Femoral pulses are strong and equal.  Pedal pulses are palpable and equal bilaterally. There is a scar in his left lower leg from prior fasciotomy. NEUROLOGIC:  Alert and oriented x3.  Motor and sensory exam are  grossly normal. SKIN:  Warm and dry.  LABORATORY EXAMINATION:  Significant for white blood cell count of 14.3, hemoglobin 12.1, hematocrit 35.9 and platelet count of 212,000. Electrolytes are significant for sodium of 130, potassium 2.8, chloride 90, CO2 26, glucose 117, BUN 23, creatinine 1.87.  CPK was 505 with an MB of 3.3, troponin less than 0.3.  His ProBNP was 4124.  MRSA screen was negative.  Cholesterol profile showed a total cholesterol of 113, triglycerides of 67, HDL 43 and LDL of 57.  Electrocardiogram showed normal sinus rhythm with left ventricular  hypertrophy and incomplete left bundle branch block.  IMPRESSION:  Mr. 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  There is no sign of leak or impending rupture and I think his dissections are chronic since he is having no chest or back pain.  He does have severe uncontrolled hypertension which should be treated acutely with Nipride and beta- blocker to try to get his systolic blood pressure less than or equal to 140 before transitioning to oral medication.  He also has right lower lobe pneumonia which was his presenting problem and this should be treated aggressively with antibiotics and pulmonary toilet especially given his history of pneumonia and ARDS in 2003.  His aortic aneurysm will require surgical treatment and given the complexity of this problem, I think it would be best treated at an aortic center.  I will discuss the case with Dr. Kizzie Bane at [facility] and I think the patient probably could be evaluated as an outpatient once his pneumonia is treated and his severe high blood pressure is under control.  I discussed the life threatening nature of his aortic disease with he and his wife and the need for surgical treatment.  He  appears to understand and agrees to proceed with surgical evaluation following treatment of his pneumonia and uncontrolled hypertension.     Evelene Croon, M.D.     BB/MEDQ  D:  07/21/2010  T:  07/21/2010  Job:  [id number]  Electronically Signed by Evelene Croon M.D. on 08/11/2010 08:48:15 AM

## 2010-08-26 NOTE — Discharge Summary (Signed)
  [name], HALTERMAN NO.:  [id number]  MEDICAL RECORD NO.:  [id number]  LOCATION:  3715                         FACILITY:  [facility]  PHYSICIAN:  Mauro Kaufmann, MD         DATE OF BIRTH:  1955/11/02  DATE OF ADMISSION:  07/21/2010 DATE OF DISCHARGE:                              DISCHARGE SUMMARY   ADDENDUM: Please see details of discharge summary done by Dr. Delsa Grana on July 31, 2010.  In brief, the patient was started on multiple medications for hypertension because blood pressure was still uncontrolled and his medications have been adjusted.  At this time, the patient's blood pressure is stable.  Over the last 24 hours, the patient's blood pressure has been less than 150.  On the day of discharge, blood pressure is 131/77, respirations 18, pulse 68, temperature is 98.1, O2 sat 97% on room air.  The patient is stable for discharge.  Again, he has appointment set up with Dr. Laneta Simmers as outpatient for followup and he will set up appointment at [facility] for the surgery for aortic dissection. The patient can get all the medications for hypertension from [facility] and we are also going to give him 30-day supply of two medications including chlorthalidone as well as hydralazine.  Rest of medication we are going to give him 3-day supply and then the patient can get these medications from [facility] at 4 dollar prescription plan.  The patient already has an appointment to see [facility] on August 21, 2010.  After that, he can get medication from the [facility].  The medications on discharge include, 1. Chlorthalidone 25 mg p.o. daily. 2. Clonidine 0.2 mg p.o. b.i.d. 3. Doxazosin 2 mg p.o. daily at bedtime. 4. Hydralazine 50 mg p.o. t.i.d. 5. Lisinopril 10 mg p.o. twice a day. 6. Metoprolol 200 mg p.o. b.i.d. 7. Potassium chloride 20 mEq p.o. b.i.d.  Follow up with [facility] on August 21, 2010, and with Dr. Laneta Simmers as outpatient.     Mauro Kaufmann, MD     GL/MEDQ  D:   08/03/2010  T:  08/03/2010  Job:  [id number]  cc:   Dr. Jolaine Click at [facility], M.D.  Electronically Signed by Mauro Kaufmann  on 08/26/2010 08:09:27 PM

## 2010-10-29 DIAGNOSIS — Z951 Presence of aortocoronary bypass graft: Secondary | ICD-10-CM | POA: Insufficient documentation

## 2010-10-29 DIAGNOSIS — Z9889 Other specified postprocedural states: Secondary | ICD-10-CM | POA: Insufficient documentation

## 2011-01-11 ENCOUNTER — Encounter (HOSPITAL_COMMUNITY)
Admission: RE | Admit: 2011-01-11 | Discharge: 2011-01-11 | Disposition: A | Discharge status: Home / Self Care | Payer: Medicaid Other | Source: Ambulatory Visit | Attending: Cardiology | Admitting: Cardiology

## 2011-01-11 DIAGNOSIS — I10 Essential (primary) hypertension: Secondary | ICD-10-CM | POA: Insufficient documentation

## 2011-01-11 DIAGNOSIS — I712 Thoracic aortic aneurysm, without rupture, unspecified: Secondary | ICD-10-CM | POA: Insufficient documentation

## 2011-01-11 DIAGNOSIS — I447 Left bundle-branch block, unspecified: Secondary | ICD-10-CM | POA: Insufficient documentation

## 2011-01-11 DIAGNOSIS — I498 Other specified cardiac arrhythmias: Secondary | ICD-10-CM | POA: Insufficient documentation

## 2011-01-11 DIAGNOSIS — Z5189 Encounter for other specified aftercare: Secondary | ICD-10-CM | POA: Insufficient documentation

## 2011-01-11 DIAGNOSIS — I71019 Dissection of thoracic aorta, unspecified: Secondary | ICD-10-CM | POA: Insufficient documentation

## 2011-01-11 DIAGNOSIS — I2510 Atherosclerotic heart disease of native coronary artery without angina pectoris: Secondary | ICD-10-CM | POA: Insufficient documentation

## 2011-01-11 DIAGNOSIS — I7101 Dissection of thoracic aorta: Secondary | ICD-10-CM | POA: Insufficient documentation

## 2011-01-11 NOTE — Progress Notes (Signed)
1115-pt in today for first day of exercise.  Pt tolerated light exercise with some complaint of fatigue especially at the end of exercise.  Monitor showed sr with frequent PAC's noted on ekg and rare pvc.  Dr Nadara Eaton office notified and spoke with md regarding strips.  Dr.  Jacinto Halim aware of ectopy and this was similar to what was seen on his stress test.  No further treatment warranted.

## 2011-01-13 ENCOUNTER — Encounter (HOSPITAL_COMMUNITY)
Admission: RE | Admit: 2011-01-13 | Discharge: 2011-01-13 | Disposition: A | Discharge status: Home / Self Care | Payer: Medicaid Other | Source: Ambulatory Visit | Attending: Cardiology | Admitting: Cardiology

## 2011-01-15 ENCOUNTER — Encounter (HOSPITAL_COMMUNITY): Payer: Medicaid Other

## 2011-01-18 ENCOUNTER — Encounter (HOSPITAL_COMMUNITY): Payer: Medicaid Other

## 2011-01-20 ENCOUNTER — Encounter (HOSPITAL_COMMUNITY): Payer: Medicaid Other

## 2011-01-22 ENCOUNTER — Encounter (HOSPITAL_COMMUNITY): Payer: Medicaid Other

## 2011-01-25 ENCOUNTER — Encounter (HOSPITAL_COMMUNITY): Payer: Medicaid Other

## 2011-01-27 ENCOUNTER — Encounter (HOSPITAL_COMMUNITY): Payer: Medicaid Other

## 2011-01-29 ENCOUNTER — Encounter (HOSPITAL_COMMUNITY): Payer: Medicaid Other

## 2011-02-03 ENCOUNTER — Encounter (HOSPITAL_COMMUNITY): Payer: Medicaid Other

## 2011-02-05 ENCOUNTER — Encounter (HOSPITAL_COMMUNITY): Payer: Medicaid Other

## 2011-02-08 ENCOUNTER — Encounter (HOSPITAL_COMMUNITY): Payer: Medicaid Other

## 2011-02-10 ENCOUNTER — Encounter (HOSPITAL_COMMUNITY): Payer: Medicaid Other

## 2011-02-12 ENCOUNTER — Encounter (HOSPITAL_COMMUNITY): Payer: Medicaid Other

## 2011-02-15 ENCOUNTER — Encounter (HOSPITAL_COMMUNITY): Payer: Medicaid Other

## 2011-02-16 NOTE — Progress Notes (Addendum)
Cardiac Rehabilitation Program Progress Report   Orientation:  01/07/2011 Discharge Date:  01/29/2011 # of sessions completed: 2/36  Cardiologist: Jacinto Halim Family MD:  [name] Sprang Class Time:  1115  A.  Exercise Program:  Tolerates exercise @ 3.3 METS for 30 minutes, Bike Test Results:  Pre: 0.93 miles, Poor attendance due to noncompliance, Needs encouragement on exercise program and Discharged  B.  Mental Health:  No QOL scores to paperwork returned  C.  Education/Instruction/Skills  Pt only attended 1 education class on exercising on your own and was able to to use the RPE scale    D.  Nutrition/Weight Control/Body Composition: pt with poor adherence to prescribed heart healthy diet.    *This section completed by Mickle Plumb, Andres Shad, RD, LDN, CDE  E.  Blood Lipids    Lab Results  Component Value Date   CHOL 113 07/21/2010     Lab Results  Component Value Date   TRIG 67 07/21/2010     Lab Results  Component Value Date   HDL 43 07/21/2010     Lab Results  Component Value Date   CHOLHDL 2.6 07/21/2010    F.  Lifestyle Changes:  Needs physician encouragement on making lifestyle changes  G.  Symptoms noted with exercise:  Asymptomatic  Report Completed By:  Hazle Nordmann   Comments:  Pt dropped from program after two sessions with no return calls or letters.  Thanks for the referral. Fabio Pierce, MA, ACSM RCEP        I agree with the above assessment .  Unable to reconcile medications, pt did not return to exercise.  Karlene Lineman RN

## 2011-02-17 ENCOUNTER — Encounter (HOSPITAL_COMMUNITY): Payer: Medicaid Other

## 2011-02-19 ENCOUNTER — Encounter (HOSPITAL_COMMUNITY): Payer: Medicaid Other

## 2011-02-22 ENCOUNTER — Encounter (HOSPITAL_COMMUNITY): Payer: Medicaid Other

## 2011-02-24 ENCOUNTER — Encounter (HOSPITAL_COMMUNITY): Payer: Medicaid Other

## 2011-02-26 ENCOUNTER — Encounter (HOSPITAL_COMMUNITY): Payer: Medicaid Other

## 2011-03-01 ENCOUNTER — Encounter (HOSPITAL_COMMUNITY): Payer: Medicaid Other

## 2011-03-03 ENCOUNTER — Encounter (HOSPITAL_COMMUNITY): Payer: Medicaid Other

## 2011-03-05 ENCOUNTER — Encounter (HOSPITAL_COMMUNITY): Payer: Medicaid Other

## 2011-03-08 ENCOUNTER — Encounter (HOSPITAL_COMMUNITY): Payer: Medicaid Other

## 2011-03-10 ENCOUNTER — Encounter (HOSPITAL_COMMUNITY): Payer: Medicaid Other

## 2011-03-12 ENCOUNTER — Encounter (HOSPITAL_COMMUNITY): Payer: Medicaid Other

## 2011-03-15 ENCOUNTER — Encounter (HOSPITAL_COMMUNITY): Payer: Medicaid Other

## 2011-03-17 ENCOUNTER — Encounter (HOSPITAL_COMMUNITY): Payer: Medicaid Other

## 2011-03-19 ENCOUNTER — Encounter (HOSPITAL_COMMUNITY): Payer: Medicaid Other

## 2011-03-22 ENCOUNTER — Encounter (HOSPITAL_COMMUNITY): Payer: Medicaid Other

## 2011-03-24 ENCOUNTER — Encounter (HOSPITAL_COMMUNITY): Payer: Medicaid Other

## 2011-03-26 ENCOUNTER — Encounter (HOSPITAL_COMMUNITY): Payer: Medicaid Other

## 2011-03-29 ENCOUNTER — Encounter (HOSPITAL_COMMUNITY): Payer: Medicaid Other

## 2011-03-31 ENCOUNTER — Encounter (HOSPITAL_COMMUNITY): Payer: Medicaid Other

## 2011-04-02 ENCOUNTER — Encounter (HOSPITAL_COMMUNITY): Payer: Medicaid Other

## 2011-04-05 ENCOUNTER — Encounter (HOSPITAL_COMMUNITY): Payer: Medicaid Other

## 2011-04-07 ENCOUNTER — Encounter (HOSPITAL_COMMUNITY): Payer: Medicaid Other

## 2011-04-09 ENCOUNTER — Encounter (HOSPITAL_COMMUNITY): Payer: Medicaid Other

## 2011-04-12 ENCOUNTER — Encounter (HOSPITAL_COMMUNITY): Payer: Medicaid Other

## 2011-04-14 ENCOUNTER — Encounter (HOSPITAL_COMMUNITY): Payer: Medicaid Other

## 2011-04-16 ENCOUNTER — Encounter (HOSPITAL_COMMUNITY): Payer: Medicaid Other

## 2013-12-27 ENCOUNTER — Encounter (HOSPITAL_COMMUNITY): Payer: Self-pay | Admitting: Emergency Medicine

## 2013-12-27 ENCOUNTER — Emergency Department (HOSPITAL_COMMUNITY): Payer: Medicaid Other

## 2013-12-27 ENCOUNTER — Inpatient Hospital Stay (HOSPITAL_COMMUNITY)
Admission: EM | Admit: 2013-12-27 | Discharge: 2014-01-14 | DRG: 374 | Disposition: A | Discharge status: Home Health | Payer: Medicaid Other | Attending: Internal Medicine | Admitting: Internal Medicine

## 2013-12-27 DIAGNOSIS — R509 Fever, unspecified: Secondary | ICD-10-CM

## 2013-12-27 DIAGNOSIS — Z22322 Carrier or suspected carrier of Methicillin resistant Staphylococcus aureus: Secondary | ICD-10-CM

## 2013-12-27 DIAGNOSIS — C189 Malignant neoplasm of colon, unspecified: Secondary | ICD-10-CM

## 2013-12-27 DIAGNOSIS — I10 Essential (primary) hypertension: Secondary | ICD-10-CM | POA: Diagnosis present

## 2013-12-27 DIAGNOSIS — I6340 Cerebral infarction due to embolism of unspecified cerebral artery: Secondary | ICD-10-CM | POA: Diagnosis present

## 2013-12-27 DIAGNOSIS — N400 Enlarged prostate without lower urinary tract symptoms: Secondary | ICD-10-CM | POA: Diagnosis present

## 2013-12-27 DIAGNOSIS — E872 Acidosis, unspecified: Secondary | ICD-10-CM | POA: Diagnosis present

## 2013-12-27 DIAGNOSIS — C19 Malignant neoplasm of rectosigmoid junction: Principal | ICD-10-CM | POA: Diagnosis present

## 2013-12-27 DIAGNOSIS — N179 Acute kidney failure, unspecified: Secondary | ICD-10-CM | POA: Diagnosis present

## 2013-12-27 DIAGNOSIS — Z79899 Other long term (current) drug therapy: Secondary | ICD-10-CM

## 2013-12-27 DIAGNOSIS — K56609 Unspecified intestinal obstruction, unspecified as to partial versus complete obstruction: Secondary | ICD-10-CM | POA: Diagnosis present

## 2013-12-27 DIAGNOSIS — F22 Delusional disorders: Secondary | ICD-10-CM

## 2013-12-27 DIAGNOSIS — Z951 Presence of aortocoronary bypass graft: Secondary | ICD-10-CM

## 2013-12-27 DIAGNOSIS — R42 Dizziness and giddiness: Secondary | ICD-10-CM

## 2013-12-27 DIAGNOSIS — R634 Abnormal weight loss: Secondary | ICD-10-CM | POA: Diagnosis present

## 2013-12-27 DIAGNOSIS — N62 Hypertrophy of breast: Secondary | ICD-10-CM | POA: Diagnosis present

## 2013-12-27 DIAGNOSIS — C801 Malignant (primary) neoplasm, unspecified: Secondary | ICD-10-CM

## 2013-12-27 DIAGNOSIS — R1900 Intra-abdominal and pelvic swelling, mass and lump, unspecified site: Secondary | ICD-10-CM | POA: Diagnosis present

## 2013-12-27 DIAGNOSIS — IMO0002 Reserved for concepts with insufficient information to code with codable children: Secondary | ICD-10-CM

## 2013-12-27 DIAGNOSIS — D72829 Elevated white blood cell count, unspecified: Secondary | ICD-10-CM | POA: Diagnosis present

## 2013-12-27 DIAGNOSIS — I739 Peripheral vascular disease, unspecified: Secondary | ICD-10-CM | POA: Diagnosis present

## 2013-12-27 DIAGNOSIS — I350 Nonrheumatic aortic (valve) stenosis: Secondary | ICD-10-CM | POA: Diagnosis present

## 2013-12-27 DIAGNOSIS — Z955 Presence of coronary angioplasty implant and graft: Secondary | ICD-10-CM

## 2013-12-27 DIAGNOSIS — C787 Secondary malignant neoplasm of liver and intrahepatic bile duct: Secondary | ICD-10-CM | POA: Diagnosis present

## 2013-12-27 DIAGNOSIS — Z8673 Personal history of transient ischemic attack (TIA), and cerebral infarction without residual deficits: Secondary | ICD-10-CM

## 2013-12-27 DIAGNOSIS — R945 Abnormal results of liver function studies: Secondary | ICD-10-CM | POA: Diagnosis present

## 2013-12-27 DIAGNOSIS — R519 Headache, unspecified: Secondary | ICD-10-CM

## 2013-12-27 DIAGNOSIS — D638 Anemia in other chronic diseases classified elsewhere: Secondary | ICD-10-CM | POA: Diagnosis present

## 2013-12-27 DIAGNOSIS — I2510 Atherosclerotic heart disease of native coronary artery without angina pectoris: Secondary | ICD-10-CM | POA: Diagnosis present

## 2013-12-27 DIAGNOSIS — R7989 Other specified abnormal findings of blood chemistry: Secondary | ICD-10-CM | POA: Diagnosis present

## 2013-12-27 DIAGNOSIS — R197 Diarrhea, unspecified: Secondary | ICD-10-CM

## 2013-12-27 DIAGNOSIS — Z6822 Body mass index (BMI) 22.0-22.9, adult: Secondary | ICD-10-CM

## 2013-12-27 DIAGNOSIS — E785 Hyperlipidemia, unspecified: Secondary | ICD-10-CM | POA: Diagnosis present

## 2013-12-27 DIAGNOSIS — E876 Hypokalemia: Secondary | ICD-10-CM | POA: Diagnosis present

## 2013-12-27 DIAGNOSIS — E871 Hypo-osmolality and hyponatremia: Secondary | ICD-10-CM | POA: Diagnosis present

## 2013-12-27 DIAGNOSIS — R140 Abdominal distension (gaseous): Secondary | ICD-10-CM | POA: Diagnosis present

## 2013-12-27 DIAGNOSIS — E43 Unspecified severe protein-calorie malnutrition: Secondary | ICD-10-CM | POA: Diagnosis present

## 2013-12-27 DIAGNOSIS — R160 Hepatomegaly, not elsewhere classified: Secondary | ICD-10-CM | POA: Diagnosis present

## 2013-12-27 DIAGNOSIS — R51 Headache: Secondary | ICD-10-CM

## 2013-12-27 DIAGNOSIS — F329 Major depressive disorder, single episode, unspecified: Secondary | ICD-10-CM | POA: Diagnosis present

## 2013-12-27 DIAGNOSIS — R229 Localized swelling, mass and lump, unspecified: Secondary | ICD-10-CM

## 2013-12-27 DIAGNOSIS — R531 Weakness: Secondary | ICD-10-CM | POA: Insufficient documentation

## 2013-12-27 DIAGNOSIS — G4700 Insomnia, unspecified: Secondary | ICD-10-CM | POA: Diagnosis present

## 2013-12-27 HISTORY — DX: Essential (primary) hypertension: I10

## 2013-12-27 HISTORY — DX: Malignant neoplasm of colon, unspecified: C18.9

## 2013-12-27 LAB — CBC WITH DIFFERENTIAL/PLATELET
BASOS ABS: 0.1 10*3/uL (ref 0.0–0.1)
Basophils Relative: 0 % (ref 0–1)
EOS PCT: 0 % (ref 0–5)
Eosinophils Absolute: 0 10*3/uL (ref 0.0–0.7)
HEMATOCRIT: 32.3 % — AB (ref 39.0–52.0)
Hemoglobin: 10.9 g/dL — ABNORMAL LOW (ref 13.0–17.0)
LYMPHS ABS: 1.2 10*3/uL (ref 0.7–4.0)
LYMPHS PCT: 8 % — AB (ref 12–46)
MCH: 26 pg (ref 26.0–34.0)
MCHC: 33.7 g/dL (ref 30.0–36.0)
MCV: 77.1 fL — AB (ref 78.0–100.0)
MONO ABS: 1.5 10*3/uL — AB (ref 0.1–1.0)
Monocytes Relative: 10 % (ref 3–12)
Neutro Abs: 12.3 10*3/uL — ABNORMAL HIGH (ref 1.7–7.7)
Neutrophils Relative %: 82 % — ABNORMAL HIGH (ref 43–77)
Platelets: 353 10*3/uL (ref 150–400)
RBC: 4.19 MIL/uL — ABNORMAL LOW (ref 4.22–5.81)
RDW: 17.5 % — AB (ref 11.5–15.5)
WBC: 15.1 10*3/uL — AB (ref 4.0–10.5)

## 2013-12-27 LAB — COMPREHENSIVE METABOLIC PANEL
ALBUMIN: 2.7 g/dL — AB (ref 3.5–5.2)
ALK PHOS: 330 U/L — AB (ref 39–117)
ALT: 21 U/L (ref 0–53)
AST: 73 U/L — ABNORMAL HIGH (ref 0–37)
Anion gap: 21 — ABNORMAL HIGH (ref 5–15)
BUN: 24 mg/dL — ABNORMAL HIGH (ref 6–23)
CO2: 21 mEq/L (ref 19–32)
Calcium: 9 mg/dL (ref 8.4–10.5)
Chloride: 93 mEq/L — ABNORMAL LOW (ref 96–112)
Creatinine, Ser: 1.56 mg/dL — ABNORMAL HIGH (ref 0.50–1.35)
GFR calc Af Amer: 55 mL/min — ABNORMAL LOW (ref >90)
GFR calc non Af Amer: 47 mL/min — ABNORMAL LOW (ref >90)
Glucose, Bld: 104 mg/dL — ABNORMAL HIGH (ref 70–99)
POTASSIUM: 3.7 meq/L (ref 3.7–5.3)
Sodium: 135 mEq/L — ABNORMAL LOW (ref 137–147)
TOTAL PROTEIN: 7.9 g/dL (ref 6.0–8.3)
Total Bilirubin: 0.6 mg/dL (ref 0.3–1.2)

## 2013-12-27 MED ORDER — IOHEXOL 350 MG/ML SOLN
80.0000 mL | Freq: Once | INTRAVENOUS | Status: AC | PRN
  Administered 2013-12-27: 50 mL via INTRAVENOUS

## 2013-12-27 MED ORDER — SODIUM CHLORIDE 0.9 % IV BOLUS (SEPSIS)
1000.0000 mL | Freq: Once | INTRAVENOUS | Status: AC
  Administered 2013-12-27: 1000 mL via INTRAVENOUS

## 2013-12-27 NOTE — ED Notes (Signed)
Patient from home where he states he has been dizzy x1 hour along with an abnormal gait of leaning to the right. Denies blurred vision, slurred speech, headache, n/v, sob, pain. BP 160/100, takes amlodipine and metoprolol for HTN. CBG 125. LSN 2000. Denies drug use

## 2013-12-27 NOTE — ED Notes (Signed)
Patient transported to X-ray 

## 2013-12-27 NOTE — ED Notes (Signed)
MD at bedside. 

## 2013-12-27 NOTE — ED Provider Notes (Signed)
CSN: [id number]     Arrival date & time 12/27/13  2113 History   First MD Initiated Contact with Patient 12/27/13 2120     Chief Complaint  Patient presents with  . Dizziness  . Gait Problem     (Consider location/radiation/quality/duration/timing/severity/associated sxs/prior Treatment) HPI  Patient is a 58 year old male who presents complaining of dizziness. He is been feeling dizzy intermittently for several months. He says today he had a sudden onset of dizziness approximately an hour ago where he felt like he was falling over, primarily to the right. He describes as a sensation of the room spinning around him. He had associated gait abnormality. He denies any associated chest pain or shortness of breath. He has had no headache or blurry vision. He said that the episode resolved spontaneously, and now he feels much better.   He denies any abdominal pain, no nausea or vomiting recently.  He does have a history of thoracic aortic aneurysm which was repaired in 2013.   Past Medical History  Diagnosis Date  . Hypertension    Past Surgical History  Procedure Laterality Date  . Coronary artery bypass graft     History reviewed. No pertinent family history. History  Substance Use Topics  . Smoking status: Never Smoker   . Smokeless tobacco: Never Used  . Alcohol Use: Yes    Review of Systems  Cardiovascular: Negative for chest pain and leg swelling.  Gastrointestinal: Positive for abdominal distention. Negative for nausea, vomiting and abdominal pain.  Genitourinary: Negative for dysuria and difficulty urinating.  Musculoskeletal: Negative for back pain and arthralgias.  Neurological: Positive for tremors, weakness and light-headedness. Negative for dizziness, seizures, syncope, numbness and headaches.  All other systems reviewed and are negative.     Allergies  Penicillins  Home Medications   Prior to Admission medications   Medication Sig Start Date End Date  Taking? Authorizing Provider  amLODipine (NORVASC) 5 MG tablet Take 5 mg by mouth daily.   Yes Historical Provider, MD  metoprolol succinate (TOPROL-XL) 50 MG 24 hr tablet Take 50 mg by mouth daily. Take with or immediately following a meal.   Yes Historical Provider, MD  simvastatin (ZOCOR) 20 MG tablet Take 20 mg by mouth daily.   Yes Historical Provider, MD  tamsulosin (FLOMAX) 0.4 MG CAPS capsule Take 0.4 mg by mouth daily after supper.   Yes Historical Provider, MD   BP 150/113 mmHg  Pulse 88  Temp(Src) 99.2 F (37.3 C) (Oral)  Resp 43  Ht 5\' 10"  (1.778 m)  Wt 160 lb (72.576 kg)  BMI 22.96 kg/m2  SpO2 98% Physical Exam  Constitutional: He is oriented to person, place, and time. He appears well-developed. No distress.  Chronically ill in appearance  HENT:  Head: Normocephalic and atraumatic.  Eyes: EOM are normal. Pupils are equal, round, and reactive to light.  No nystagmus  Neck: Normal range of motion.  Cardiovascular: Normal rate.   Murmur heard.  Systolic (blowing) murmur is present with a grade of 5/6  Pulmonary/Chest: Effort normal. No respiratory distress. He has rales (faint in the bases).  Abdominal: Soft. He exhibits mass ( Multiple firm masses throughout the abdomen including the right upper quadrant and epigastric area).  Musculoskeletal: Normal range of motion. He exhibits no edema or tenderness.  Neurological: He is alert and oriented to person, place, and time.  Subtle dysmetria of the left upper extremity, as well as slight pronator drift of the left upper extremity.  Skin: Skin is  warm and dry.  Psychiatric: He has a normal mood and affect.  Nursing note and vitals reviewed.   ED Course  Procedures (including critical care time) Labs Review Labs Reviewed  CBC WITH DIFFERENTIAL - Abnormal; Notable for the following:    WBC 15.1 (*)    RBC 4.19 (*)    Hemoglobin 10.9 (*)    HCT 32.3 (*)    MCV 77.1 (*)    RDW 17.5 (*)    Neutrophils Relative % 82 (*)     Neutro Abs 12.3 (*)    Lymphocytes Relative 8 (*)    Monocytes Absolute 1.5 (*)    All other components within normal limits  COMPREHENSIVE METABOLIC PANEL - Abnormal; Notable for the following:    Sodium 135 (*)    Chloride 93 (*)    Glucose, Bld 104 (*)    BUN 24 (*)    Creatinine, Ser 1.56 (*)    Albumin 2.7 (*)    AST 73 (*)    Alkaline Phosphatase 330 (*)    GFR calc non Af Amer 47 (*)    GFR calc Af Amer 55 (*)    Anion gap 21 (*)    All other components within normal limits  URINALYSIS, ROUTINE W REFLEX MICROSCOPIC  I-STAT CG4 LACTIC ACID, ED    Imaging Review No results found.   EKG Interpretation   Date/Time:  Thursday December 27 2013 21:25:50 EST Ventricular Rate:  106 PR Interval:  174 QRS Duration: 117 QT Interval:  384 QTC Calculation: 510 R Axis:   -37 Text Interpretation:  Sinus tachycardia Probable left atrial enlargement  LVH with IVCD, LAD and secondary repol abnrm Probable inferior infarct,  recent Anterior ST elevation, probably due to LVH Prolonged QT interval  prolonged QT new since previous  Confirmed by YAO  MD, DAVID ([id number]) on  12/27/2013 9:28:17 PM      MDM   Final diagnoses:  Weakness  Headache  Dizziness   Patient presents with multiple vague complaints. Dizziness and imbalance has resolved at this point, however this appears to be recurrent and concerning for posterior circulation pathology. Considering the patient is a vasculopath, and has a history of a thoracic aneurysm, will get a CT angiogram of the head and neck to rule out vascular abnormalities are or aneurysms I could be causing his dizziness and neurologic symptoms.  Patient also has firm masses throughout his abdomen, systolic murmur, which is likely result of his previous thoracic aneurysm, and some rales in the bases bilaterally. He is also hypertensive. Labs [place] for lactic acidosis, anion gap metabolic acidosis, leukocytosis to 15.1, elevated AST and alkaline  phosphatase. 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 Abdominal x-ray shows nonobstructive bowel gas pattern, and radiopaque material in the right upper quadrant.  Chest x-ray shows no evidence of pneumonia.  CTA of the head and neck notable for numerous old appearing strokes, as well as innumerable vascular abnormalities, but none appear acute. General Hospital's consulted and will follow along with the patient, as he has never had any stroke workup before.  Patient will need admission to the hospital for further workup of this yet undifferentiated condition, thought to favor malignancy, lactic acidosis, and stroke workup.   Leata Mouse, MD 12/29/13 3354  Wandra Arthurs, MD 12/29/13 2255

## 2013-12-28 ENCOUNTER — Inpatient Hospital Stay (HOSPITAL_COMMUNITY): Payer: Medicaid Other

## 2013-12-28 ENCOUNTER — Encounter (HOSPITAL_COMMUNITY): Payer: Self-pay | Admitting: *Deleted

## 2013-12-28 DIAGNOSIS — I6340 Cerebral infarction due to embolism of unspecified cerebral artery: Secondary | ICD-10-CM | POA: Diagnosis present

## 2013-12-28 DIAGNOSIS — E872 Acidosis, unspecified: Secondary | ICD-10-CM | POA: Diagnosis present

## 2013-12-28 DIAGNOSIS — R42 Dizziness and giddiness: Secondary | ICD-10-CM

## 2013-12-28 DIAGNOSIS — R7989 Other specified abnormal findings of blood chemistry: Secondary | ICD-10-CM | POA: Diagnosis present

## 2013-12-28 DIAGNOSIS — R634 Abnormal weight loss: Secondary | ICD-10-CM | POA: Diagnosis present

## 2013-12-28 DIAGNOSIS — R269 Unspecified abnormalities of gait and mobility: Secondary | ICD-10-CM

## 2013-12-28 DIAGNOSIS — I369 Nonrheumatic tricuspid valve disorder, unspecified: Secondary | ICD-10-CM

## 2013-12-28 DIAGNOSIS — R945 Abnormal results of liver function studies: Secondary | ICD-10-CM | POA: Diagnosis present

## 2013-12-28 DIAGNOSIS — E785 Hyperlipidemia, unspecified: Secondary | ICD-10-CM | POA: Diagnosis present

## 2013-12-28 DIAGNOSIS — R1900 Intra-abdominal and pelvic swelling, mass and lump, unspecified site: Secondary | ICD-10-CM

## 2013-12-28 DIAGNOSIS — N179 Acute kidney failure, unspecified: Secondary | ICD-10-CM | POA: Diagnosis present

## 2013-12-28 DIAGNOSIS — R160 Hepatomegaly, not elsewhere classified: Secondary | ICD-10-CM | POA: Diagnosis present

## 2013-12-28 DIAGNOSIS — D72829 Elevated white blood cell count, unspecified: Secondary | ICD-10-CM | POA: Diagnosis present

## 2013-12-28 LAB — LIPID PANEL
Cholesterol: 328 mg/dL — ABNORMAL HIGH (ref 0–200)
HDL: 17 mg/dL — ABNORMAL LOW (ref >39)
LDL CALC: 281 mg/dL — AB (ref 0–99)
TRIGLYCERIDES: 150 mg/dL — AB (ref <150)
Total CHOL/HDL Ratio: 19.3 RATIO
VLDL: 30 mg/dL (ref 0–40)

## 2013-12-28 LAB — ACETAMINOPHEN LEVEL

## 2013-12-28 LAB — CBC WITH DIFFERENTIAL/PLATELET
Basophils Absolute: 0 10*3/uL (ref 0.0–0.1)
Basophils Relative: 0 % (ref 0–1)
EOS PCT: 0 % (ref 0–5)
Eosinophils Absolute: 0 10*3/uL (ref 0.0–0.7)
HEMATOCRIT: 29.3 % — AB (ref 39.0–52.0)
Hemoglobin: 9.8 g/dL — ABNORMAL LOW (ref 13.0–17.0)
LYMPHS ABS: 2 10*3/uL (ref 0.7–4.0)
Lymphocytes Relative: 15 % (ref 12–46)
MCH: 26.6 pg (ref 26.0–34.0)
MCHC: 33.4 g/dL (ref 30.0–36.0)
MCV: 79.6 fL (ref 78.0–100.0)
MONO ABS: 2 10*3/uL — AB (ref 0.1–1.0)
MONOS PCT: 15 % — AB (ref 3–12)
NEUTROS ABS: 9.5 10*3/uL — AB (ref 1.7–7.7)
Neutrophils Relative %: 70 % (ref 43–77)
PLATELETS: 289 10*3/uL (ref 150–400)
RBC: 3.68 MIL/uL — ABNORMAL LOW (ref 4.22–5.81)
RDW: 17.6 % — ABNORMAL HIGH (ref 11.5–15.5)
WBC: 13.5 10*3/uL — AB (ref 4.0–10.5)

## 2013-12-28 LAB — URINE MICROSCOPIC-ADD ON

## 2013-12-28 LAB — COMPREHENSIVE METABOLIC PANEL
ALBUMIN: 2.4 g/dL — AB (ref 3.5–5.2)
ALK PHOS: 279 U/L — AB (ref 39–117)
ALT: 19 U/L (ref 0–53)
AST: 69 U/L — ABNORMAL HIGH (ref 0–37)
Anion gap: 17 — ABNORMAL HIGH (ref 5–15)
BUN: 23 mg/dL (ref 6–23)
CHLORIDE: 97 meq/L (ref 96–112)
CO2: 23 mEq/L (ref 19–32)
Calcium: 8.5 mg/dL (ref 8.4–10.5)
Creatinine, Ser: 1.28 mg/dL (ref 0.50–1.35)
GFR calc non Af Amer: 60 mL/min — ABNORMAL LOW (ref >90)
GFR, EST AFRICAN AMERICAN: 70 mL/min — AB (ref >90)
Glucose, Bld: 93 mg/dL (ref 70–99)
POTASSIUM: 3.2 meq/L — AB (ref 3.7–5.3)
Sodium: 137 mEq/L (ref 137–147)
Total Bilirubin: 0.5 mg/dL (ref 0.3–1.2)
Total Protein: 6.9 g/dL (ref 6.0–8.3)

## 2013-12-28 LAB — IRON AND TIBC
Iron: 15 ug/dL — ABNORMAL LOW (ref 42–135)
Saturation Ratios: 7 % — ABNORMAL LOW (ref 20–55)
TIBC: 215 ug/dL (ref 215–435)
UIBC: 200 ug/dL (ref 125–400)

## 2013-12-28 LAB — GLUCOSE, CAPILLARY
Glucose-Capillary: 98 mg/dL (ref 70–99)
Glucose-Capillary: 94 mg/dL (ref 70–99)
Glucose-Capillary: 97 mg/dL (ref 70–99)
Glucose-Capillary: 86 mg/dL (ref 70–99)
Glucose-Capillary: 111 mg/dL — ABNORMAL HIGH (ref 70–99)

## 2013-12-28 LAB — HEMOGLOBIN A1C
HEMOGLOBIN A1C: 5.5 % (ref <5.7)
Mean Plasma Glucose: 111 mg/dL (ref <117)

## 2013-12-28 LAB — URINALYSIS, ROUTINE W REFLEX MICROSCOPIC
BILIRUBIN URINE: NEGATIVE
Glucose, UA: NEGATIVE mg/dL
Ketones, ur: NEGATIVE mg/dL
Leukocytes, UA: NEGATIVE
Nitrite: NEGATIVE
PH: 6.5 (ref 5.0–8.0)
Protein, ur: NEGATIVE mg/dL
SPECIFIC GRAVITY, URINE: 1.019 (ref 1.005–1.030)
Urobilinogen, UA: 1 mg/dL (ref 0.0–1.0)

## 2013-12-28 LAB — RAPID URINE DRUG SCREEN, HOSP PERFORMED
AMPHETAMINES: NOT DETECTED
Barbiturates: NOT DETECTED
Benzodiazepines: NOT DETECTED
Cocaine: NOT DETECTED
OPIATES: NOT DETECTED
Tetrahydrocannabinol: POSITIVE — AB

## 2013-12-28 LAB — I-STAT CG4 LACTIC ACID, ED: Lactic Acid, Venous: 4.03 mmol/L — ABNORMAL HIGH (ref 0.5–2.2)

## 2013-12-28 LAB — TSH: TSH: 1.62 u[IU]/mL (ref 0.350–4.500)

## 2013-12-28 LAB — SALICYLATE LEVEL

## 2013-12-28 LAB — ETHANOL

## 2013-12-28 LAB — VITAMIN B12

## 2013-12-28 LAB — OCCULT BLOOD X 1 CARD TO LAB, STOOL: Fecal Occult Bld: POSITIVE — AB

## 2013-12-28 LAB — PROTIME-INR
INR: 1.2 (ref 0.00–1.49)
Prothrombin Time: 15.4 seconds — ABNORMAL HIGH (ref 11.6–15.2)

## 2013-12-28 LAB — FERRITIN: Ferritin: 159 ng/mL (ref 22–322)

## 2013-12-28 MED ORDER — IOHEXOL 300 MG/ML  SOLN
100.0000 mL | Freq: Once | INTRAMUSCULAR | Status: AC | PRN
  Administered 2013-12-28: 100 mL via INTRAVENOUS

## 2013-12-28 MED ORDER — PANTOPRAZOLE SODIUM 40 MG PO TBEC
40.0000 mg | DELAYED_RELEASE_TABLET | Freq: Every day | ORAL | Status: DC
  Administered 2013-12-28 – 2014-01-10 (×13): 40 mg via ORAL
  Filled 2013-12-28 (×14): qty 1

## 2013-12-28 MED ORDER — AMLODIPINE BESYLATE 5 MG PO TABS
5.0000 mg | ORAL_TABLET | Freq: Every day | ORAL | Status: DC
  Administered 2013-12-28 – 2013-12-29 (×2): 5 mg via ORAL
  Filled 2013-12-28 (×3): qty 1

## 2013-12-28 MED ORDER — IOHEXOL 300 MG/ML  SOLN
25.0000 mL | INTRAMUSCULAR | Status: AC
  Administered 2013-12-28 (×2): 25 mL via ORAL

## 2013-12-28 MED ORDER — SODIUM CHLORIDE 0.9 % IV SOLN
1020.0000 mg | Freq: Once | INTRAVENOUS | Status: AC
  Administered 2013-12-29: 1020 mg via INTRAVENOUS
  Filled 2013-12-28 (×2): qty 34

## 2013-12-28 MED ORDER — PANTOPRAZOLE SODIUM 40 MG IV SOLR
40.0000 mg | Freq: Once | INTRAVENOUS | Status: AC
  Administered 2013-12-28: 40 mg via INTRAVENOUS
  Filled 2013-12-28: qty 40

## 2013-12-28 MED ORDER — ONDANSETRON HCL 4 MG/2ML IJ SOLN: 4.0000 mg | Freq: Four times a day (QID) | INTRAMUSCULAR | Status: DC | PRN

## 2013-12-28 MED ORDER — AMLODIPINE BESYLATE 5 MG PO TABS
5.0000 mg | ORAL_TABLET | Freq: Every day | ORAL | Status: DC
  Filled 2013-12-28: qty 1

## 2013-12-28 MED ORDER — POTASSIUM CHLORIDE CRYS ER 20 MEQ PO TBCR
40.0000 meq | EXTENDED_RELEASE_TABLET | ORAL | Status: AC
  Administered 2013-12-28: 40 meq via ORAL
  Filled 2013-12-28: qty 2

## 2013-12-28 MED ORDER — SODIUM CHLORIDE 0.9 % IV BOLUS (SEPSIS)
500.0000 mL | Freq: Once | INTRAVENOUS | Status: AC
  Administered 2013-12-28: 500 mL via INTRAVENOUS

## 2013-12-28 MED ORDER — SODIUM CHLORIDE 0.9 % IV SOLN
INTRAVENOUS | Status: DC
  Administered 2013-12-28 – 2013-12-29 (×3): via INTRAVENOUS
  Administered 2013-12-30: 500 mL via INTRAVENOUS
  Administered 2013-12-30: 100 mL/h via INTRAVENOUS
  Administered 2013-12-30 – 2013-12-31 (×3): via INTRAVENOUS

## 2013-12-28 MED ORDER — ASPIRIN 325 MG PO TABS
325.0000 mg | ORAL_TABLET | Freq: Every day | ORAL | Status: DC
  Administered 2013-12-28 – 2013-12-29 (×2): 325 mg via ORAL
  Filled 2013-12-28 (×3): qty 1

## 2013-12-28 MED ORDER — OXYCODONE HCL 5 MG PO TABS
5.0000 mg | ORAL_TABLET | ORAL | Status: DC | PRN
  Administered 2013-12-28 – 2014-01-06 (×8): 5 mg via ORAL
  Filled 2013-12-28 (×10): qty 1

## 2013-12-28 MED ORDER — METOPROLOL SUCCINATE ER 50 MG PO TB24
50.0000 mg | ORAL_TABLET | Freq: Every day | ORAL | Status: DC
  Administered 2013-12-28 – 2014-01-14 (×18): 50 mg via ORAL
  Filled 2013-12-28 (×19): qty 1

## 2013-12-28 MED ORDER — MECLIZINE HCL 25 MG PO TABS
25.0000 mg | ORAL_TABLET | Freq: Three times a day (TID) | ORAL | Status: DC | PRN
  Filled 2013-12-28: qty 1

## 2013-12-28 MED ORDER — HYDRALAZINE HCL 20 MG/ML IJ SOLN
10.0000 mg | Freq: Once | INTRAMUSCULAR | Status: AC
  Administered 2013-12-28: 10 mg via INTRAVENOUS
  Filled 2013-12-28: qty 1

## 2013-12-28 NOTE — Plan of Care (Signed)
Problem: Phase I Progression Outcomes Goal: Voiding-avoid urinary catheter unless indicated Outcome: Completed/Met Date Met:  12/28/13

## 2013-12-28 NOTE — Plan of Care (Signed)
Problem: Acute Treatment Outcomes Goal: 02 Sats > 94% Outcome: Completed/Met Date Met:  12/28/13

## 2013-12-28 NOTE — Plan of Care (Signed)
Problem: Phase II Progression Outcomes Goal: Obtain order to discontinue catheter if appropriate Outcome: Not Applicable Date Met:  12/28/13     

## 2013-12-28 NOTE — Plan of Care (Signed)
Problem: Progression Outcomes Goal: Tolerating diet/TF at goal rate Outcome: Completed/Met Date Met:  12/28/13

## 2013-12-28 NOTE — Care Management Note (Unsigned)
    Page 1 of 1   01/01/2014     4:33:54 PM CARE MANAGEMENT NOTE 01/01/2014  Patient:  [name]   Account Number:  [id number]  Date Initiated:  12/28/2013  Documentation initiated by:  Tomi Bamberger  Subjective/Objective Assessment:   dx lactic acidosis, tia  admit as observation- from home     Action/Plan:   11/20 on clears  pt eval- rec hhpt- pt has no insurance.   Anticipated DC Date:  01/02/2014   Anticipated DC Plan:  [place]  CM consult  Follow-up appt scheduled      Choice offered to / List presented to:             Status of service:  Completed, signed off Medicare Important Message given?  NO (If response is "NO", the following Medicare IM given date fields will be blank) Date Medicare IM given:   Medicare IM given by:   Date Additional Medicare IM given:   Additional Medicare IM given by:    Discharge Disposition:    Per UR Regulation:  Reviewed for med. necessity/level of care/duration of stay  If discussed at [place] of Stay Meetings, dates discussed:    Comments:  12/31/13 [place] [phone] patient has apt scheduled on 12/4 at 1:30 at [facility] on [facility] [facility].  He has a orange card for meds as well.  12/28/13 [place], BSN [phone] NCM spoke with patient he has an orange card  for his med, he goes to [facility] on [address].

## 2013-12-28 NOTE — Progress Notes (Signed)
SLP Cancellation Note  Patient Details Name: [name] MRN: [id number] DOB: 1955-04-07   Cancelled treatment:        Pt. NPO for abdominal scan.  RN to page SLP when pt. returns    Houston Siren 12/28/2013, 8:34 AM [phone]

## 2013-12-28 NOTE — Plan of Care (Signed)
Problem: Acute Treatment Outcomes Goal: Neuro exam at baseline or improved Outcome: Completed/Met Date Met:  12/28/13

## 2013-12-28 NOTE — Plan of Care (Signed)
Problem: Progression Outcomes Goal: If vent dependent, tolerates weaning Outcome: Not Applicable Date Met:  12/28/13     

## 2013-12-28 NOTE — Consult Note (Signed)
Consultation  Referring Provider:  [facility]    Primary Care Physician:  No primary care provider on file. Primary Gastroenterologist:  none       Reason for Consultation: colon mass / liver lesions             HPI:   [name] is a 58 y.o. male originally from [place] admitted with dizziness, slurred speech. He has a history of CVAs. MRI reveals multiplee infarcts but findings chronic.  Neurology evaluating.  Abdominal mass found on exam, CTscan done and reveals enlarged liver with multiple mass, abnormal 7cm segment of sigmoid colon suggestive of primary colon mass.   Patient hasn't seen a physician in several months (at least). He has lost excessive weight and reports rectal bleeding over the last week. No constipation or abdominal pain. No Ripley of colon cancer.   Past Medical History  Diagnosis Date  . Hypertension     Past Surgical History  Procedure Laterality Date  . Coronary artery bypass graft      FMH: no colon cancer  History  Substance Use Topics  . Smoking status: Never Smoker   . Smokeless tobacco: Never Used  . Alcohol Use: Yes    Prior to Admission medications   Medication Sig Start Date End Date Taking? Authorizing Provider  amLODipine (NORVASC) 5 MG tablet Take 5 mg by mouth daily.   Yes Historical Provider, MD  metoprolol succinate (TOPROL-XL) 50 MG 24 hr tablet Take 50 mg by mouth daily. Take with or immediately following a meal.   Yes Historical Provider, MD  simvastatin (ZOCOR) 20 MG tablet Take 20 mg by mouth daily.   Yes Historical Provider, MD  tamsulosin (FLOMAX) 0.4 MG CAPS capsule Take 0.4 mg by mouth daily after supper.   Yes Historical Provider, MD    Current Facility-Administered Medications  Medication Dose Route Frequency Provider Last Rate Last Dose  . 0.9 %  sodium chloride infusion   Intravenous Continuous Berle Mull, MD 100 mL/hr at 12/28/13 0338    . amLODipine (NORVASC) tablet 5 mg  5 mg Oral Daily Berle Mull, MD   5  mg at 12/28/13 0658  . aspirin tablet 325 mg  325 mg Oral Daily Berle Mull, MD   325 mg at 12/28/13 1413  . meclizine (ANTIVERT) tablet 25 mg  25 mg Oral TID PRN Barton Dubois, MD      . metoprolol succinate (TOPROL-XL) 24 hr tablet 50 mg  50 mg Oral Daily Berle Mull, MD   50 mg at 12/28/13 1412  . ondansetron (ZOFRAN) injection 4 mg  4 mg Intravenous Q6H PRN Berle Mull, MD      . pantoprazole (PROTONIX) EC tablet 40 mg  40 mg Oral Daily Berle Mull, MD   40 mg at 12/28/13 1413    Allergies as of 12/27/2013 - Review Complete 12/27/2013  Allergen Reaction Noted  . Penicillins  12/27/2013    Review of Systems:    All systems reviewed and negative except where noted in HPI.   Physical Exam:  Vital signs in last 24 hours: Temp:  [98.4 F (36.9 C)-99.2 F (37.3 C)] 98.7 F (37.1 C) (11/20 0605) Pulse Rate:  [74-109] 77 (11/20 1404) Resp:  [13-43] 22 (11/20 1404) BP: (139-163)/(91-113) 139/105 mmHg (11/20 1404) SpO2:  [97 %-100 %] 100 % (11/20 1404) Weight:  [158 lb 11.7 oz (72 kg)-160 lb (72.576 kg)] 158 lb 11.7 oz (72 kg) (11/20 0302) Last BM Date: 12/27/13 General:  Pleasant male in NAD Head:  Normocephalic and atraumatic. Eyes:   No icterus.   Conjunctiva pink. Ears:  Normal auditory acuity. Neck:  Supple; no masses felt Lungs:  Respirations even and unlabored. Lungs clear to auscultation bilaterally.   No wheezes, crackles, or rhonchi.  Heart:  Regular rate and rhythm;  Loud murmur. Abdomen:  Soft, nontender. Large mass mid upper abdomen.  Normal bowel sounds.  Msk:  Symmetrical without gross deformities.  Extremities:  Without edema. Neurologic:  Alert and  oriented x4;  grossly normal neurologically. Skin:  Intact without significant lesions or rashes. Cervical Nodes:  No significant cervical adenopathy. Psych:  Alert and cooperative. Normal affect.  LAB RESULTS:  Recent Labs  12/27/13 2127 12/28/13 0334  WBC 15.1* 13.5*  HGB 10.9* 9.8*  HCT 32.3* 29.3*    PLT 353 289   BMET  Recent Labs  12/27/13 2127 12/28/13 0334  NA 135* 137  K 3.7 3.2*  CL 93* 97  CO2 21 23  GLUCOSE 104* 93  BUN 24* 23  CREATININE 1.56* 1.28  CALCIUM 9.0 8.5   LFT  Recent Labs  12/28/13 0334  PROT 6.9  ALBUMIN 2.4*  AST 69*  ALT 19  ALKPHOS 279*  BILITOT 0.5   PT/INR  Recent Labs  12/28/13 0334  LABPROT 15.4*  INR 1.20    STUDIES: Ct Angio Head W/cm &/or Wo Cm  12/28/2013   CLINICAL DATA:  Dizziness with abnormal gait, symptoms for 1 hr mildly hypertensive.  EXAM: CT ANGIOGRAPHY HEAD AND NECK  TECHNIQUE: Multi detector CT axial noncontrast CT images of the head. Multidetector CT imaging of the head and neck was performed using the standard protocol during bolus administration of intravenous contrast. Multiplanar CT image reconstructions and MIPs were obtained to evaluate the vascular anatomy. Carotid stenosis measurements (when applicable) are obtained utilizing NASCET criteria, using the distal internal carotid diameter as the denominator.  CONTRAST:  64m OMNIPAQUE IOHEXOL 350 MG/ML SOLN  COMPARISON:  CT of the chest July 20, 2010  FINDINGS: CT HEAD FINDINGS  No intraparenchymal hemorrhage, mass effect, midline shift. Remote LEFT basal ganglia infarct with ex vacuo dilatation of frontal horn of LEFT lateral ventricle. Subcentimeter RIGHT basal ganglia chronic appearing lacunar infarcts. Moderate generalized ventriculomegaly, likely on the basis of global parenchymal brain volume loss as there is overall commensurate enlargement of cerebral sulci and cerebellar folia. RIGHT mesial occipital lobe transcortical encephalomalacia, LEFT parietal transcortical encephalomalacia. Tiny LEFT and possibly RIGHT cerebellar infarct. Confluent supratentorial white matter hypodensities. No abnormal intraparenchymal or extra-axial enhancement.  No abnormal extra-axial fluid collections. Moderate calcific atherosclerosis of the carotid siphons. Subcentimeter  calcifications within the insular branches of bilateral middle cerebral arteries and, along the A 2/3 bilateral anterior cerebral arteries. Basal cisterns are patent.  No skull fracture. The visualized paranasal sinuses and mastoid air cells are well aerated.  CTA HEAD FINDINGS  Anterior circulation: Widely patent cervical, features, cavernous and supra clinoid internal carotid arteries with moderate calcific atherosclerosis. Mildly ectatic appearance of the carotid internal carotid arteries and supra clinoid internal carotid arteries. Robust RIGHT and diminutive LEFT A1 segments with patent bilateral anterior and middle cerebral arteries. Calcified insular middle cerebral artery branches and anterior cerebral artery A2/3 segments. Irregular appearance of the RIGHT A 2/3 segments.  Posterior circulation: RIGHT vertebral artery is dominant with thready irregular LEFT V4 segment. Tortuous basilar artery, main branch vessels appear patent. Robust LEFT posterior communicating artery with compensatory diminutive LEFT P1 segment. Normal appearance of bilateral posterior  cerebral arteries.  Ectatic intracranial vessels without aneurysm , large vessel occlusion, hemodynamically significant stenosis.  Poor dentition with multiple dental caries and periapical lucencies.  Review of the MIP images confirms the above findings.  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 Origin of the RIGHT Common carotid artery is not imaged. Retrograde apparent filling of the RIGHT subclavian artery, origin of LEFT subclavian artery not included.  Are all appearance of the bilateral Common carotid arteries which course in a straight line fashion appear widely patent. 1-2 mm eccentric calcific atherosclerosis of the RIGHT carotid bulb without hemodynamically significant stenosis of the internal carotid  artery origins by NASCET criteria. Normal appearance of cervical internal carotid artery.  Origin the RIGHT vertebral artery is widely patent. Origin of the LEFT vertebral artery is not well identified and possibly occluded. However, there is mild contrast opacification that intra foraminal LEFT vertebral artery likely via muscular branches. Widely patent RIGHT vertebral artery which is dominant. Thready left V3 and V4 segments.  Included view of the chest demonstrates median sternotomy. 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 Straightened cervical lordosis.  Review of the MIP images confirms the above findings.  IMPRESSION: CT HEAD:  No definite acute intracranial process.  RIGHT posterior cerebral artery, LEFT middle cerebral artery territory of remote infarcts including LEFT basal ganglia. Additional small basal ganglia and cerebellar infarcts.  Moderate white matter changes suggest chronic small vessel ischemic disease. Moderate global brain atrophy, advanced for age.  CTA HEAD:  No acute vascular process.  Thready irregular LEFT intradural vertebral artery likely reflecting intracranial atherosclerosis with similar findings of the RIGHT A2/3 segment.  Dolicoectatic intracranial vessels likely reflect chronic hypertension.  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 No contrast extravasation within the visualized portion of the thoracic aorta.  Chronically occluded origin LEFT vertebral artery, with patent foraminal segment likely via muscular branches.  No acute vascular process within the neck vessels, no hemodynamically significant stenosis.  Acute findings discussed with and reconfirmed by Dr.CHRIS DAVIS on 12/27/2013 at 12:05 am.   Electronically Signed   By: Awilda Metro   On: 12/28/2013 00:07    Ct Angio Neck W/cm &/or Wo/cm  12/28/2013   CLINICAL  DATA:  Dizziness with abnormal gait, symptoms for 1 hr mildly hypertensive.  EXAM: CT ANGIOGRAPHY HEAD AND NECK  TECHNIQUE: Multi detector CT axial noncontrast CT images of the head. Multidetector CT imaging of the head and neck was performed using the standard protocol during bolus administration of intravenous contrast. Multiplanar CT image reconstructions and MIPs were obtained to evaluate the vascular anatomy. Carotid stenosis measurements (when applicable) are obtained utilizing NASCET criteria, using the distal internal carotid diameter as the denominator.  CONTRAST:  87mL OMNIPAQUE IOHEXOL 350 MG/ML SOLN  COMPARISON:  CT of the chest July 20, 2010  FINDINGS: CT HEAD FINDINGS  No intraparenchymal hemorrhage, mass effect, midline shift. Remote LEFT basal ganglia infarct with ex vacuo dilatation of frontal horn of LEFT lateral ventricle. Subcentimeter RIGHT basal ganglia chronic appearing lacunar infarcts. Moderate generalized ventriculomegaly, likely on the basis of global parenchymal brain volume loss as there is overall commensurate enlargement of cerebral sulci and cerebellar folia. RIGHT mesial occipital lobe transcortical encephalomalacia, LEFT parietal transcortical encephalomalacia. Tiny LEFT and possibly RIGHT cerebellar infarct. Confluent supratentorial white matter hypodensities. No abnormal  intraparenchymal or extra-axial enhancement.  No abnormal extra-axial fluid collections. Moderate calcific atherosclerosis of the carotid siphons. Subcentimeter calcifications within the insular branches of bilateral middle cerebral arteries and, along the A 2/3 bilateral anterior cerebral arteries. Basal cisterns are patent.  No skull fracture. The visualized paranasal sinuses and mastoid air cells are well aerated.  CTA HEAD FINDINGS  Anterior circulation: Widely patent cervical, features, cavernous and supra clinoid internal carotid arteries with moderate calcific atherosclerosis. Mildly ectatic appearance of  the carotid internal carotid arteries and supra clinoid internal carotid arteries. Robust RIGHT and diminutive LEFT A1 segments with patent bilateral anterior and middle cerebral arteries. Calcified insular middle cerebral artery branches and anterior cerebral artery A2/3 segments. Irregular appearance of the RIGHT A 2/3 segments.  Posterior circulation: RIGHT vertebral artery is dominant with thready irregular LEFT V4 segment. Tortuous basilar artery, main branch vessels appear patent. Robust LEFT posterior communicating artery with compensatory diminutive LEFT P1 segment. Normal appearance of bilateral posterior cerebral arteries.  Ectatic intracranial vessels without aneurysm , large vessel occlusion, hemodynamically significant stenosis.  Poor dentition with multiple dental caries and periapical lucencies.  Review of the MIP images confirms the above findings.  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 Origin of the RIGHT Common carotid artery is not imaged. Retrograde apparent filling of the RIGHT subclavian artery, origin of LEFT subclavian artery not included.  Are all appearance of the bilateral Common carotid arteries which course in a straight line fashion appear widely patent. 1-2 mm eccentric calcific atherosclerosis of the RIGHT carotid bulb without hemodynamically significant stenosis of the internal carotid artery origins by NASCET criteria. Normal appearance of cervical internal carotid artery.  Origin the RIGHT vertebral artery is widely patent. Origin of the LEFT vertebral artery is not well identified and possibly occluded. However, there is mild contrast opacification that intra foraminal LEFT vertebral artery likely via muscular branches. Widely patent RIGHT vertebral artery which is dominant. Thready left V3 and V4 segments.  Included view of the chest  demonstrates median sternotomy. 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 Straightened cervical lordosis.  Review of the MIP images confirms the above findings.  IMPRESSION: CT HEAD:  No definite acute intracranial process.  RIGHT posterior cerebral artery, LEFT middle cerebral artery territory of remote infarcts including LEFT basal ganglia. Additional small basal ganglia and cerebellar infarcts.  Moderate white matter changes suggest chronic small vessel ischemic disease. Moderate global brain atrophy, advanced for age.  CTA HEAD:  No acute vascular process.  Thready irregular LEFT intradural vertebral artery likely reflecting intracranial atherosclerosis with similar findings of the RIGHT A2/3 segment.  Dolicoectatic intracranial vessels likely reflect chronic hypertension.  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 No contrast extravasation within the visualized portion of the thoracic aorta.  Chronically occluded origin LEFT vertebral artery, with patent foraminal segment likely via muscular branches.  No acute vascular process within the neck vessels, no hemodynamically significant stenosis.  Acute findings discussed with and reconfirmed by Dr.CHRIS DAVIS on 12/27/2013 at 12:05 am.   Electronically Signed   By: Awilda Metro   On: 12/28/2013 00:07   Mri Brain Without Contrast  12/28/2013   CLINICAL DATA:  Chronic dizziness for 2 months worsening in the past week. Two falls.  EXAM: MRI HEAD WITHOUT CONTRAST  MRA HEAD WITHOUT CONTRAST  TECHNIQUE: Multiplanar, multiecho pulse sequences of the brain and surrounding structures were obtained without intravenous contrast. Angiographic images of the head were obtained using MRA technique without contrast.  COMPARISON:  CTA head 12/27/2013  FINDINGS: MRI HEAD FINDINGS  There is a 6 mm focus of increased diffusion  weighted signal intensity in the posterior left corona radiata without definite restricted diffusion on the ADC map. There are also a few punctate foci of mildly increased diffusion-weighted signal in the high right frontal lobe involving white matter greater than cortex, also without restricted diffusion identified on the ADC map although with evaluation limited by their small size.  Scattered foci of chronic microhemorrhage are present throughout both cerebral hemispheres. Small, chronic infarcts are present in both cerebellar hemispheres. Chronic lacunar infarcts are present in the left greater than right basal ganglia and right frontal periventricular white matter. Small to moderate-sized chronic infarct is present in the left parietal lobe with associated chronic blood product, and there is a small, chronic right occipital cortical infarct. There is mild generalized cerebral atrophy. Patchy T2 hyperintensities in the subcortical and deep cerebral white matter elsewhere are nonspecific but compatible with mild to moderate chronic small vessel ischemic disease, advanced for age.  Orbits are unremarkable. Paranasal sinuses and mastoid air cells are clear. Distal left vertebral artery flow void is not well seen. Major intracranial vascular flow voids are otherwise preserved.  MRA HEAD FINDINGS  The visualized distal right vertebral artery is patent and dominant, supplying the basilar. Only minimal, intermittent flow related enhancement is present in the expected region of the intracranial left vertebral artery, which was shown to be very small in caliber although patent on the recent CTA. Basilar artery is patent and tortuous without stenosis. SCA origins are patent. Right P1 segment is patent without stenosis. There is a mild to moderate focal proximal right P2 stenosis. There is a prominent, patent left posterior communicating artery. No significant left PCA stenosis is seen.  Internal carotid arteries are patent  from skullbase to carotid termini without stenosis. Cavernous and supraclinoid segments are again noted to be mildly ectatic, right more so than left. MCAs are unremarkable aside from mild branch vessel irregularity. There are large right A1 and hypoplastic left A1 segments. There are moderate to severe tandem stenoses involving the A2/A3 segments bilaterally. No intracranial aneurysm is identified.  IMPRESSION: 1. No significant sized acute or subacute infarct. Subcentimeter foci of abnormal diffusion-weighted signal in the left corona radiata and high right frontal lobe may reflect subacute embolic/small vessel infarcts. 2. 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 3. No significant flow related enhancement identified in the visualized distal left vertebral artery. This session to be very small but patent on the recent CTA. 4. Mild-to-moderate proximal right P2 stenosis. 5. Ectatic distal internal carotid arteries. 6. Moderate to severe bilateral A2/A3 stenoses.   Electronically Signed   By: Logan Bores   On: 12/28/2013 13:32   Ct Abdomen Pelvis W Contrast  12/28/2013   CLINICAL DATA:  Abdominal tenderness. Abdominal distention. Abnormal liver function tests. History of hypertension and CABG surgery. Abdominal mass.  EXAM: CT ABDOMEN AND PELVIS WITH CONTRAST  TECHNIQUE: Multidetector CT imaging of the abdomen and pelvis was performed using the standard protocol following bolus administration of intravenous contrast.  CONTRAST:  146m OMNIPAQUE IOHEXOL 300 MG/ML  SOLN  COMPARISON:  None.  FINDINGS: Heart is moderately enlarged. There is a right aortic arch. A stent extends through the thoracic aorta. Calcified granuloma  is noted in the right lower lobe. No other lung base nodules.  Liver is enlarged containing a numerous heterogeneous lesions. A conglomeration of lesions in the right lobe measures approximate 14 cm x 8.1 cm transversely. A discrete mass in the left lobe, crossing  the ligamentum flavum, measures 5.6 cm x 5.3 cm transversely. Conglomeration of lesions in the lateral segment of the left lobe measures 8.2 cm x 5.2 cm. There are numerous additional lesions. Lateral segment of the left lobe of the liver is larger the right lobe. Liver measures 27 cm in greatest transverse dimension.  There is a 7 cm long length of thick-walled sigmoid colon with associated small mesenteric lymph nodes. Given the findings in the liver, this is highly suspicious for a colonic malignancy.  No other colonic masses. No obstruction. No bowel inflammatory change. Normal appendix is seen.  Spleen is normal in size. Gallbladder contains multiple gallstones. There is no evidence acute cholecystitis. Is no bile duct dilation. Pancreas is unremarkable. No adrenal masses. Bilateral renal cortical thinning. There are several small low-density renal lesions most likely cysts. Kidneys otherwise unremarkable. No hydronephrosis. Ureters normal course and caliber. Bladder is unremarkable.  There is several prominent left retroperitoneal lymph nodes and aortocaval nodes. None are larger than 1 cm short axis. These may be neoplastic or reactive.  Small amount of ascites is noted collecting adjacent to the liver and in the posterior pelvic recess.  No osteoblastic or osteolytic lesions. Degenerative changes of the hips, right greater than left. Spine degenerative changes most evident at L3-L4.  IMPRESSION: 1. No acute finding. 2. Liver is enlarged with numerous ill-defined hypo attenuating and mixed attenuation lesions. There is a 7 cm length of sigmoid colon that is diffusely thickened, consistent with a primary colon malignancy. Metastatic disease is liver is suspected. Liver abnormalities could potentially reflect multifocal hepatic cellular carcinoma. 3. Small amount of ascites. No other evidence of metastatic disease. Nodes.   Electronically Signed   By: Lajean Manes M.D.   On: 12/28/2013 13:45   Mr Jodene Nam  Head/brain Wo Cm  12/28/2013   CLINICAL DATA:  Chronic dizziness for 2 months worsening in the past week. Two falls.  EXAM: MRI HEAD WITHOUT CONTRAST  MRA HEAD WITHOUT CONTRAST  TECHNIQUE: Multiplanar, multiecho pulse sequences of the brain and surrounding structures were obtained without intravenous contrast. Angiographic images of the head were obtained using MRA technique without contrast.  COMPARISON:  CTA head 12/27/2013  FINDINGS: MRI HEAD FINDINGS  There is a 6 mm focus of increased diffusion weighted signal intensity in the posterior left corona radiata without definite restricted diffusion on the ADC map. There are also a few punctate foci of mildly increased diffusion-weighted signal in the high right frontal lobe involving white matter greater than cortex, also without restricted diffusion identified on the ADC map although with evaluation limited by their small size.  Scattered foci of chronic microhemorrhage are present throughout both cerebral hemispheres. Small, chronic infarcts are present in both cerebellar hemispheres. Chronic lacunar infarcts are present in the left greater than right basal ganglia and right frontal periventricular white matter. Small to moderate-sized chronic infarct is present in the left parietal lobe with associated chronic blood product, and there is a small, chronic right occipital cortical infarct. There is mild generalized cerebral atrophy. Patchy T2 hyperintensities in the subcortical and deep cerebral white matter elsewhere are nonspecific but compatible with mild to moderate chronic small vessel ischemic disease, advanced for age.  Orbits are unremarkable. Paranasal  sinuses and mastoid air cells are clear. Distal left vertebral artery flow void is not well seen. Major intracranial vascular flow voids are otherwise preserved.  MRA HEAD FINDINGS  The visualized distal right vertebral artery is patent and dominant, supplying the basilar. Only minimal, intermittent flow  related enhancement is present in the expected region of the intracranial left vertebral artery, which was shown to be very small in caliber although patent on the recent CTA. Basilar artery is patent and tortuous without stenosis. SCA origins are patent. Right P1 segment is patent without stenosis. There is a mild to moderate focal proximal right P2 stenosis. There is a prominent, patent left posterior communicating artery. No significant left PCA stenosis is seen.  Internal carotid arteries are patent from skullbase to carotid termini without stenosis. Cavernous and supraclinoid segments are again noted to be mildly ectatic, right more so than left. MCAs are unremarkable aside from mild branch vessel irregularity. There are large right A1 and hypoplastic left A1 segments. There are moderate to severe tandem stenoses involving the A2/A3 segments bilaterally. No intracranial aneurysm is identified.  IMPRESSION: 1. No significant sized acute or subacute infarct. Subcentimeter foci of abnormal diffusion-weighted signal in the left corona radiata and high right frontal lobe may reflect subacute embolic/small vessel infarcts. 2. 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 3. No significant flow related enhancement identified in the visualized distal left vertebral artery. This session to be very small but patent on the recent CTA. 4. Mild-to-moderate proximal right P2 stenosis. 5. Ectatic distal internal carotid arteries. 6. Moderate to severe bilateral A2/A3 stenoses.   Electronically Signed   By: Logan Bores   On: 12/28/2013 13:32    PREVIOUS ENDOSCOPIES:            none   Echo Study Conclusions  - Left ventricle: The cavity size was normal. There was severe concentric hypertrophy. 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 Systolic function was vigorous. The estimated ejection fraction was in the range of 65% to 70%. There was no dynamic  obstruction. Wall motion was normal; there were no regional wall motion abnormalities. Doppler parameters are consistent with abnormal left ventricular relaxation (grade 1 diastolic dysfunction). Doppler parameters are consistent with high ventricular filling pressure. - Mitral valve: Calcified annulus. - Left atrium: The atrium was mildly dilated. - Tricuspid valve: There was moderate regurgitation. - Pulmonary arteries: Systolic pressure was mildly increased. PA peak pressure: 34 mm Hg (S). - Pericardium, extracardiac: A trivial pericardial effusion was identified.  EF 65-70%   Impression / Plan:   8. 58 year old male with large sigmoid mass and multiple liver lesions on CTscan  LFTs elevated, mainly alk phos. He has lost a significant amount of weight recently. No bowel changes but rectal bleeding with BMs over the last week.  Patient will need colonoscopy (at least sigmoidoscopy) for tissue sample. Clear diet today. Will arrange time of procedure with Dr. Olevia Perches.   2. Hx of CVAs. Admitted with slurred speech, dizziness. Imaging shows subacute embolic/small vessel infarcts. Neurology following.   3. Loud murmur. Echo done - see results above.   Thanks   LOS: 1 day   Tye Savoy  12/28/2013, 4:57 PM  Attending MD note:   I have taken a history,  and reviewed the chart. I agree with the Advanced Practitioner's impression and recommendations. To proceed with flex sigm/ colonoscopy for tissue diagnosis. CEA pending. Will tentatively schedule for Sunday 12/31/2013  [facility]  Pager # [phone]

## 2013-12-28 NOTE — Plan of Care (Signed)
Problem: Acute Treatment Outcomes Goal: tPA Patient w/o S&S of bleeding Outcome: Not Applicable Date Met:  92/49/32

## 2013-12-28 NOTE — Consult Note (Addendum)
Stroke Consult    Chief Complaint: gait instability HPI: [name] is an 58 y.o. male presents complaining of dizziness. Describes it as feeling unsteady on his feet. Denies a true vertigo sensation. Notes this episode started this morning and is more severe, had 2 falls while at home. Reports feeling like he wanted to fall back and to the right with standing.  Has had similar episodes for the past few months but those were not as severe as this. Reports feeling ok as long as he is not trying to walk. Denies any focal motor or sensory changes. No visual changes. No headache.    He does note that his symptoms have mostly resolved and he feels more back to his baseline. + history of HTN and HLD, no DM. He is not on a daily ASA.   Labs in ED pertinent for lactic acid of 4.03, WBC 15.1. CT head and angiogram of head and neck images reviewed and pertinent for "left MCA territory of remote infarct...additional small basal ganglia and cerebellar infarcts. Thready irregular left indradural vertebral artery likely reflecting intracranial atherosclerosis with similar findings of the right A2/3 segment. 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 Hypertension     Past Surgical History  Procedure Laterality Date  . Coronary artery bypass graft      History reviewed. No pertinent family history. Social History:  reports that he has never smoked. He has never used smokeless tobacco. He reports that he drinks alcohol. He reports that he uses illicit drugs (Marijuana).  Allergies:  Allergies  Allergen Reactions  . Penicillins     childhood     (Not in a hospital admission)  ROS: Out of a complete 14 system review, the patient complains of only the following symptoms, and all other reviewed systems are negative. +fatigue,  dizziness  Physical Examination: Blood pressure 161/99, pulse 75, temperature 99.2 F (37.3 C), temperature source Oral, resp. rate 32, height 5\' 10"  (1.778 m), weight 72.576 kg (160 lb), SpO2 99 %.  Gen: NAD, pleasant CV: RRR, harsh systolic murmur Pulm: CTA bilat Abd: firm mass noted in RUQ and epigastric area  Neurologic Examination: Mental Status: Alert, oriented, thought content appropriate.  Speech fluent without evidence of aphasia. Mild dysarthria.  Able to follow 3 step commands without difficulty. Cranial Nerves: II: funduscopic exam wnl bilaterally, visual fields grossly normal, pupils equal, round, reactive to light and accommodation III,IV, VI: ptosis not present, extra-ocular motions intact bilaterally V,VII: smile symmetric, facial light touch sensation normal bilaterally VIII: hearing normal bilaterally IX,X: gag reflex present XI: trapezius strength/neck flexion strength normal bilaterally XII: tongue strength normal  Motor: Proximal RUE 5-/5, otherwise 5/5 strength in all extremities Tone and bulk:normal tone throughout; no atrophy noted Sensory: Pinprick and light touch intact throughout, bilaterally Deep Tendon Reflexes: 1+ and symmetric throughout Plantars: Right: equivocal   Left: downgoing Cerebellar: Mild dysmetria with FTN on left side Gait: slowly stands, slightly wide based but able to walk without assistance. Wobbles with eyes open and feet together  Laboratory Studies:   Basic Metabolic Panel:  Recent Labs Lab 12/27/13 2127  NA 135*  K 3.7  CL 93*  CO2 21  GLUCOSE 104*  BUN 24*  CREATININE 1.56*  CALCIUM 9.0    Liver Function Tests:  Recent Labs Lab 12/27/13 2127  AST 73*  ALT 21  ALKPHOS 330*  BILITOT 0.6  PROT 7.9  ALBUMIN 2.7*   No results for input(s): LIPASE, AMYLASE in the last 168 hours. No results for input(s): AMMONIA in the last 168 hours.  CBC:  Recent Labs Lab 12/27/13 2127  WBC 15.1*  NEUTROABS 12.3*  HGB  10.9*  HCT 32.3*  MCV 77.1*  PLT 353    Cardiac Enzymes: No results for input(s): CKTOTAL, CKMB, CKMBINDEX, TROPONINI in the last 168 hours.  BNP: Invalid input(s): POCBNP  CBG: No results for input(s): GLUCAP in the last 168 hours.  Microbiology: Results for orders placed or performed during the hospital encounter of 07/21/10  MRSA PCR Screening     Status: None   Collection Time: 07/21/10  4:00 AM  Result Value Ref Range Status   MRSA by PCR NEGATIVE NEGATIVE Final    Comment:        The GeneXpert MRSA Assay (FDA approved for NASAL specimens only), is one component of a comprehensive MRSA colonization surveillance program. It is not intended to diagnose MRSA infection nor to guide or monitor treatment for MRSA infections.    Coagulation Studies: No results for input(s): LABPROT, INR in the last 72 hours.  Urinalysis: No results for input(s): COLORURINE, LABSPEC, PHURINE, GLUCOSEU, HGBUR, BILIRUBINUR, KETONESUR, PROTEINUR, UROBILINOGEN, NITRITE, LEUKOCYTESUR in the last 168 hours.  Invalid input(s): APPERANCEUR  Lipid Panel:     Component Value Date/Time   CHOL 113 07/21/2010 0655   TRIG 67 07/21/2010 0655   HDL 43 07/21/2010 0655   CHOLHDL 2.6 07/21/2010 0655   VLDL 13 07/21/2010 0655   LDLCALC 57 07/21/2010 0655    HgbA1C: No results found for: HGBA1C  Urine Drug Screen:  No results found for: LABOPIA, COCAINSCRNUR, LABBENZ, AMPHETMU, THCU, LABBARB  Alcohol Level: No results for input(s): ETH in the last 168 hours.  Other results: NWG:NFAOZ tachycardia  Imaging: Ct Angio Head W/cm &/or Wo Cm  12/28/2013   CLINICAL DATA:  Dizziness with abnormal gait, symptoms for 1 hr mildly hypertensive.  EXAM: CT ANGIOGRAPHY HEAD AND NECK  TECHNIQUE: Multi detector CT axial noncontrast CT images of the head. Multidetector CT imaging of the head and neck was performed using the standard protocol during bolus administration of intravenous contrast. Multiplanar CT image  reconstructions and MIPs were obtained to evaluate the vascular anatomy. Carotid stenosis measurements (when applicable) are obtained utilizing NASCET criteria, using the distal internal carotid diameter as the denominator.  CONTRAST:  76mL OMNIPAQUE IOHEXOL 350 MG/ML SOLN  COMPARISON:  CT of the chest July 20, 2010  FINDINGS: CT HEAD FINDINGS  No intraparenchymal hemorrhage, mass effect, midline shift. Remote LEFT basal ganglia infarct with ex vacuo dilatation of frontal horn of LEFT lateral ventricle. Subcentimeter RIGHT basal ganglia chronic appearing lacunar infarcts. Moderate generalized ventriculomegaly, likely on the basis of global parenchymal brain volume loss as there is overall commensurate enlargement of cerebral sulci and cerebellar folia. RIGHT mesial occipital lobe transcortical encephalomalacia, LEFT parietal transcortical encephalomalacia. Tiny LEFT and possibly RIGHT cerebellar infarct. Confluent supratentorial white matter hypodensities. No abnormal intraparenchymal or extra-axial enhancement.  No abnormal extra-axial fluid collections. Moderate calcific atherosclerosis of the carotid siphons. Subcentimeter calcifications within the insular branches of bilateral middle cerebral arteries and, along the A 2/3 bilateral anterior cerebral arteries. Basal cisterns are patent.  No skull fracture. The visualized paranasal sinuses and mastoid air cells are well aerated.  CTA HEAD FINDINGS  Anterior circulation: Widely patent cervical, features, cavernous and supra clinoid internal carotid arteries  with moderate calcific atherosclerosis. Mildly ectatic appearance of the carotid internal carotid arteries and supra clinoid internal carotid arteries. Robust RIGHT and diminutive LEFT A1 segments with patent bilateral anterior and middle cerebral arteries. Calcified insular middle cerebral artery branches and anterior cerebral artery A2/3 segments. Irregular appearance of the RIGHT A 2/3 segments.  Posterior  circulation: RIGHT vertebral artery is dominant with thready irregular LEFT V4 segment. Tortuous basilar artery, main branch vessels appear patent. Robust LEFT posterior communicating artery with compensatory diminutive LEFT P1 segment. Normal appearance of bilateral posterior cerebral arteries.  Ectatic intracranial vessels without aneurysm , large vessel occlusion, hemodynamically significant stenosis.  Poor dentition with multiple dental caries and periapical lucencies.  Review of the MIP images confirms the above findings.  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 Origin of the RIGHT Common carotid artery is not imaged. Retrograde apparent filling of the RIGHT subclavian artery, origin of LEFT subclavian artery not included.  Are all appearance of the bilateral Common carotid arteries which course in a straight line fashion appear widely patent. 1-2 mm eccentric calcific atherosclerosis of the RIGHT carotid bulb without hemodynamically significant stenosis of the internal carotid artery origins by NASCET criteria. Normal appearance of cervical internal carotid artery.  Origin the RIGHT vertebral artery is widely patent. Origin of the LEFT vertebral artery is not well identified and possibly occluded. However, there is mild contrast opacification that intra foraminal LEFT vertebral artery likely via muscular branches. Widely patent RIGHT vertebral artery which is dominant. Thready left V3 and V4 segments.  Included view of the chest demonstrates median sternotomy. 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 Straightened cervical lordosis.  Review of the MIP images confirms the above findings.  IMPRESSION: CT HEAD:  No definite acute intracranial process.  RIGHT posterior cerebral artery, LEFT middle cerebral artery territory of remote infarcts including LEFT basal ganglia. Additional small  basal ganglia and cerebellar infarcts.  Moderate white matter changes suggest chronic small vessel ischemic disease. Moderate global brain atrophy, advanced for age.  CTA HEAD:  No acute vascular process.  Thready irregular LEFT intradural vertebral artery likely reflecting intracranial atherosclerosis with similar findings of the RIGHT A2/3 segment.  Dolicoectatic intracranial vessels likely reflect chronic hypertension.  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 No contrast extravasation within the visualized portion of the thoracic aorta.  Chronically occluded origin LEFT vertebral artery, with patent foraminal segment likely via muscular branches.  No acute vascular process within the neck vessels, no hemodynamically significant stenosis.  Acute findings discussed with and reconfirmed by Dr.CHRIS DAVIS on 12/27/2013 at 12:05 am.   Electronically Signed   By: Elon Alas   On: 12/28/2013 00:07   Dg Chest 2 View  12/28/2013   CLINICAL DATA:  General weakness.  Difficulty standing.  Dizziness.  EXAM: CHEST  2 VIEW  COMPARISON:  Chest 07/27/2010.  CT chest 07/20/2010.  FINDINGS: Postoperative changes with sternotomy wires in the mediastinum and vascular markers present. Ectatic right-sided aortic arch and descending aorta with stent graft in place. Cardiac enlargement. No pulmonary vascular congestion. Linear atelectasis in the right lung base. No blunting of costophrenic angles. No pneumothorax. No focal consolidation in the lungs. Curvilinear scarring versus bulla in the left mid lung is unchanged. Degenerative changes in the spine.  IMPRESSION: No evidence of active pulmonary  disease. Linear atelectasis in the right lung base. Right-sided aortic arch post stent graft.   Electronically Signed   By: Lucienne Capers M.D.   On: 12/28/2013 00:15    Dg Abd 1 View  12/28/2013   CLINICAL DATA:  Abdominal distention  EXAM: ABDOMEN - 1 VIEW  COMPARISON:  None.  FINDINGS: Postoperative changes in the chest. Residual contrast material in the renal collecting system and bladder. Rounded calcifications in the right upper quadrant likely representing gallstones. Vascular calcifications. Degenerative changes in the spine and hips. Scattered gas and stool in the colon. No small or large bowel distention.  IMPRESSION: Nonobstructive bowel gas pattern. Residual contrast material in the renal collecting system and bladder. Cholelithiasis.   Electronically Signed   By: Lucienne Capers M.D.   On: 12/28/2013 00:17   Ct Angio Neck W/cm &/or Wo/cm  12/28/2013   CLINICAL DATA:  Dizziness with abnormal gait, symptoms for 1 hr mildly hypertensive.  EXAM: CT ANGIOGRAPHY HEAD AND NECK  TECHNIQUE: Multi detector CT axial noncontrast CT images of the head. Multidetector CT imaging of the head and neck was performed using the standard protocol during bolus administration of intravenous contrast. Multiplanar CT image reconstructions and MIPs were obtained to evaluate the vascular anatomy. Carotid stenosis measurements (when applicable) are obtained utilizing NASCET criteria, using the distal internal carotid diameter as the denominator.  CONTRAST:  71mL OMNIPAQUE IOHEXOL 350 MG/ML SOLN  COMPARISON:  CT of the chest July 20, 2010  FINDINGS: CT HEAD FINDINGS  No intraparenchymal hemorrhage, mass effect, midline shift. Remote LEFT basal ganglia infarct with ex vacuo dilatation of frontal horn of LEFT lateral ventricle. Subcentimeter RIGHT basal ganglia chronic appearing lacunar infarcts. Moderate generalized ventriculomegaly, likely on the basis of global parenchymal brain volume loss as there is overall commensurate enlargement of cerebral sulci and cerebellar folia. RIGHT mesial occipital lobe transcortical encephalomalacia, LEFT parietal transcortical encephalomalacia. Tiny  LEFT and possibly RIGHT cerebellar infarct. Confluent supratentorial white matter hypodensities. No abnormal intraparenchymal or extra-axial enhancement.  No abnormal extra-axial fluid collections. Moderate calcific atherosclerosis of the carotid siphons. Subcentimeter calcifications within the insular branches of bilateral middle cerebral arteries and, along the A 2/3 bilateral anterior cerebral arteries. Basal cisterns are patent.  No skull fracture. The visualized paranasal sinuses and mastoid air cells are well aerated.  CTA HEAD FINDINGS  Anterior circulation: Widely patent cervical, features, cavernous and supra clinoid internal carotid arteries with moderate calcific atherosclerosis. Mildly ectatic appearance of the carotid internal carotid arteries and supra clinoid internal carotid arteries. Robust RIGHT and diminutive LEFT A1 segments with patent bilateral anterior and middle cerebral arteries. Calcified insular middle cerebral artery branches and anterior cerebral artery A2/3 segments. Irregular appearance of the RIGHT A 2/3 segments.  Posterior circulation: RIGHT vertebral artery is dominant with thready irregular LEFT V4 segment. Tortuous basilar artery, main branch vessels appear patent. Robust LEFT posterior communicating artery with compensatory diminutive LEFT P1 segment. Normal appearance of bilateral posterior cerebral arteries.  Ectatic intracranial vessels without aneurysm , large vessel occlusion, hemodynamically significant stenosis.  Poor dentition with multiple dental caries and periapical lucencies.  Review of the MIP images confirms the above findings.  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 Origin of the RIGHT Common carotid artery is not imaged. Retrograde apparent filling of the RIGHT subclavian artery, origin of LEFT subclavian artery not  included.  Are all appearance of the bilateral Common carotid  arteries which course in a straight line fashion appear widely patent. 1-2 mm eccentric calcific atherosclerosis of the RIGHT carotid bulb without hemodynamically significant stenosis of the internal carotid artery origins by NASCET criteria. Normal appearance of cervical internal carotid artery.  Origin the RIGHT vertebral artery is widely patent. Origin of the LEFT vertebral artery is not well identified and possibly occluded. However, there is mild contrast opacification that intra foraminal LEFT vertebral artery likely via muscular branches. Widely patent RIGHT vertebral artery which is dominant. Thready left V3 and V4 segments.  Included view of the chest demonstrates median sternotomy. 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 Straightened cervical lordosis.  Review of the MIP images confirms the above findings.  IMPRESSION: CT HEAD:  No definite acute intracranial process.  RIGHT posterior cerebral artery, LEFT middle cerebral artery territory of remote infarcts including LEFT basal ganglia. Additional small basal ganglia and cerebellar infarcts.  Moderate white matter changes suggest chronic small vessel ischemic disease. Moderate global brain atrophy, advanced for age.  CTA HEAD:  No acute vascular process.  Thready irregular LEFT intradural vertebral artery likely reflecting intracranial atherosclerosis with similar findings of the RIGHT A2/3 segment.  Dolicoectatic intracranial vessels likely reflect chronic hypertension.  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 No contrast extravasation within the visualized portion of the thoracic aorta.  Chronically occluded origin LEFT vertebral artery, with patent foraminal segment likely via muscular branches.  No acute vascular process within the neck  vessels, no hemodynamically significant stenosis.  Acute findings discussed with and reconfirmed by Dr.CHRIS DAVIS on 12/27/2013 at 12:05 am.   Electronically Signed   By: Elon Alas   On: 12/28/2013 00:07    Assessment: 58 y.o. male with multiple risk factors including HTN, HLD, prior CVA and severe vascular disease presenting with transient episodes of dizziness and gait instability. With vascular disease shown on CT angiogram will have concern over posterior circulation infarct/TIA. With leukocytosis and elevated lactic acid he also appears to have an underlying infection.  Of note, found to have abdominal masses of unclear etiology on exam.   Plan: 1. HgbA1c, fasting lipid panel 2. MRI, MRA  of the brain without contrast 3. PT consult, OT consult, Speech consult 4. Echocardiogram 5. Carotid dopplers 6. Prophylactic therapy-ASA 325mg  daily. May benefit from dual antiplatelet therapy but will defer to stroke team  7. Risk factor modification 8. Telemetry monitoring 9. Frequent neuro checks 10. NPO until RN stroke swallow screen 11. Workup of abdominal mass and leukocytosis per primary team.    Jim Like, DO [facility] [phone]  If 7pm- 7am, please page neurology on call as listed in [place]. 12/28/2013, 1:43 AM

## 2013-12-28 NOTE — Progress Notes (Signed)
  Echocardiogram 2D Echocardiogram has been performed.  [name] M 12/28/2013, 10:52 AM

## 2013-12-28 NOTE — Progress Notes (Signed)
OT Cancellation Note  Patient Details Name: [name] MRN: [id number] DOB: [birth year]   Cancelled Treatment:    Reason Eval/Treat Not Completed: Patient at procedure or test/ unavailable (Pt at CT) Will continue to follow.  Malka So 12/28/2013, 1:49 PM

## 2013-12-28 NOTE — H&P (Signed)
[facility] History and Physical  Patient: [name]  QTM:[id number]  DOB: 1956-01-23  DOS: the patient was seen and examined on 12/28/2013 PCP: No primary care provider on file.  Chief Complaint: Dizziness  HPI: [name] is a 57 y.o. male with Past medical history of hypertension, BPH. The patient is presenting with complaints of dizziness. He mentions that he has chronic dizziness that has been present since last 2 months but since last week he has been having worsening of the dizziness which worsened today. He had 2 falls while at home. He denies dizziness as vertigo. He denies any headache blurring of the vision or any speech difficulty. He denies any recent upper respiratory infection. He mentions that he has lost 25+ pound weight over last 2 months. He denies any diarrhea but mentions about decrease in appetite. He denies any fever or chills denies any cough denies any burning urination. He takes medications for blood pressure. He mentions he has seen a urologist last week for unclear reason and the is suspected to have a prostate hypertrophic versus prostate mass. He denies any prior cardiac etiology issue, or prior CVA. He denies any alcohol abuse and a daily basis as he denies any drug abuse he denies any smoking.  The patient is coming from home. And at his baseline independent for most of his ADL.  Review of Systems: as mentioned in the history of present illness.  A Comprehensive review of the other systems is negative.  Past Medical History  Diagnosis Date  . Hypertension    Past Surgical History  Procedure Laterality Date  . Coronary artery bypass graft     Social History:  reports that he has never smoked. He has never used smokeless tobacco. He reports that he drinks alcohol. He reports that he uses illicit drugs (Marijuana).  Allergies  Allergen Reactions  . Penicillins     childhood    History reviewed. No pertinent family history.  Prior to  Admission medications   Medication Sig Start Date End Date Taking? Authorizing Provider  amLODipine (NORVASC) 5 MG tablet Take 5 mg by mouth daily.   Yes Historical Provider, MD  metoprolol succinate (TOPROL-XL) 50 MG 24 hr tablet Take 50 mg by mouth daily. Take with or immediately following a meal.   Yes Historical Provider, MD  simvastatin (ZOCOR) 20 MG tablet Take 20 mg by mouth daily.   Yes Historical Provider, MD  tamsulosin (FLOMAX) 0.4 MG CAPS capsule Take 0.4 mg by mouth daily after supper.   Yes Historical Provider, MD    Physical Exam: Filed Vitals:   12/28/13 0100 12/28/13 0302 12/28/13 0604 12/28/13 0605  BP: 161/99 144/103 163/103 163/103  Pulse: 75 75 74 74  Temp:  98.4 F (36.9 C) 98.7 F (37.1 C) 98.7 F (37.1 C)  TempSrc:  Oral Oral Oral  Resp: 32 30 16 16   Height:  5\' 10"  (1.778 m)    Weight:  72 kg (158 lb 11.7 oz)    SpO2: 99% 99% 100% 100%    General: Alert, Awake and Oriented to Time, Place and Person. Appear in mild distress Eyes: PERRL ENT: Oral Mucosa clear moist. Neck: no JVD Cardiovascular: S1 and S2 Present, aortic systolic Murmur, Peripheral Pulses Present Respiratory: Bilateral Air entry equal and Decreased, Clear to Auscultation, no Crackles, no wheezes Abdomen: Bowel Sound present suspected hepatomegaly, Soft and no tender Skin: No Rash Extremities: No Pedal edema, no calf tenderness Neurologic: Grossly no focal neuro deficit.  Labs on  Admission:  CBC:  Recent Labs Lab 12/27/13 2127 12/28/13 0334  WBC 15.1* 13.5*  NEUTROABS 12.3* 9.5*  HGB 10.9* 9.8*  HCT 32.3* 29.3*  MCV 77.1* 79.6  PLT 353 289    CMP     Component Value Date/Time   NA 137 12/28/2013 0334   K 3.2* 12/28/2013 0334   CL 97 12/28/2013 0334   CO2 23 12/28/2013 0334   GLUCOSE 93 12/28/2013 0334   BUN 23 12/28/2013 0334   CREATININE 1.28 12/28/2013 0334   CALCIUM 8.5 12/28/2013 0334   PROT 6.9 12/28/2013 0334   ALBUMIN 2.4* 12/28/2013 0334   AST 69* 12/28/2013  0334   ALT 19 12/28/2013 0334   ALKPHOS 279* 12/28/2013 0334   BILITOT 0.5 12/28/2013 0334   GFRNONAA 60* 12/28/2013 0334   GFRAA 70* 12/28/2013 0334    No results for input(s): LIPASE, AMYLASE in the last 168 hours. No results for input(s): AMMONIA in the last 168 hours.  No results for input(s): CKTOTAL, CKMB, CKMBINDEX, TROPONINI in the last 168 hours. BNP (last 3 results) No results for input(s): PROBNP in the last 8760 hours.  Radiological Exams on Admission: Ct Angio Head W/cm &/or Wo Cm  12/28/2013   CLINICAL DATA:  Dizziness with abnormal gait, symptoms for 1 hr mildly hypertensive.  EXAM: CT ANGIOGRAPHY HEAD AND NECK  TECHNIQUE: Multi detector CT axial noncontrast CT images of the head. Multidetector CT imaging of the head and neck was performed using the standard protocol during bolus administration of intravenous contrast. Multiplanar CT image reconstructions and MIPs were obtained to evaluate the vascular anatomy. Carotid stenosis measurements (when applicable) are obtained utilizing NASCET criteria, using the distal internal carotid diameter as the denominator.  CONTRAST:  76mL OMNIPAQUE IOHEXOL 350 MG/ML SOLN  COMPARISON:  CT of the chest July 20, 2010  FINDINGS: CT HEAD FINDINGS  No intraparenchymal hemorrhage, mass effect, midline shift. Remote LEFT basal ganglia infarct with ex vacuo dilatation of frontal horn of LEFT lateral ventricle. Subcentimeter RIGHT basal ganglia chronic appearing lacunar infarcts. Moderate generalized ventriculomegaly, likely on the basis of global parenchymal brain volume loss as there is overall commensurate enlargement of cerebral sulci and cerebellar folia. RIGHT mesial occipital lobe transcortical encephalomalacia, LEFT parietal transcortical encephalomalacia. Tiny LEFT and possibly RIGHT cerebellar infarct. Confluent supratentorial white matter hypodensities. No abnormal intraparenchymal or extra-axial enhancement.  No abnormal extra-axial fluid  collections. Moderate calcific atherosclerosis of the carotid siphons. Subcentimeter calcifications within the insular branches of bilateral middle cerebral arteries and, along the A 2/3 bilateral anterior cerebral arteries. Basal cisterns are patent.  No skull fracture. The visualized paranasal sinuses and mastoid air cells are well aerated.  CTA HEAD FINDINGS  Anterior circulation: Widely patent cervical, features, cavernous and supra clinoid internal carotid arteries with moderate calcific atherosclerosis. Mildly ectatic appearance of the carotid internal carotid arteries and supra clinoid internal carotid arteries. Robust RIGHT and diminutive LEFT A1 segments with patent bilateral anterior and middle cerebral arteries. Calcified insular middle cerebral artery branches and anterior cerebral artery A2/3 segments. Irregular appearance of the RIGHT A 2/3 segments.  Posterior circulation: RIGHT vertebral artery is dominant with thready irregular LEFT V4 segment. Tortuous basilar artery, main branch vessels appear patent. Robust LEFT posterior communicating artery with compensatory diminutive LEFT P1 segment. Normal appearance of bilateral posterior cerebral arteries.  Ectatic intracranial vessels without aneurysm , large vessel occlusion, hemodynamically significant stenosis.  Poor dentition with multiple dental caries and periapical lucencies.  Review of the MIP images confirms the  above findings.  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 Origin of the RIGHT Common carotid artery is not imaged. Retrograde apparent filling of the RIGHT subclavian artery, origin of LEFT subclavian artery not included.  Are all appearance of the bilateral Common carotid arteries which course in a straight line fashion appear widely patent. 1-2 mm eccentric calcific atherosclerosis of the RIGHT  carotid bulb without hemodynamically significant stenosis of the internal carotid artery origins by NASCET criteria. Normal appearance of cervical internal carotid artery.  Origin the RIGHT vertebral artery is widely patent. Origin of the LEFT vertebral artery is not well identified and possibly occluded. However, there is mild contrast opacification that intra foraminal LEFT vertebral artery likely via muscular branches. Widely patent RIGHT vertebral artery which is dominant. Thready left V3 and V4 segments.  Included view of the chest demonstrates median sternotomy. 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 Straightened cervical lordosis.  Review of the MIP images confirms the above findings.  IMPRESSION: CT HEAD:  No definite acute intracranial process.  RIGHT posterior cerebral artery, LEFT middle cerebral artery territory of remote infarcts including LEFT basal ganglia. Additional small basal ganglia and cerebellar infarcts.  Moderate white matter changes suggest chronic small vessel ischemic disease. Moderate global brain atrophy, advanced for age.  CTA HEAD:  No acute vascular process.  Thready irregular LEFT intradural vertebral artery likely reflecting intracranial atherosclerosis with similar findings of the RIGHT A2/3 segment.  Dolicoectatic intracranial vessels likely reflect chronic hypertension.  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 No contrast extravasation within the visualized portion of the thoracic aorta.  Chronically occluded origin LEFT vertebral artery, with patent foraminal segment likely via muscular branches.  No acute vascular process within the neck vessels, no hemodynamically significant stenosis.  Acute findings discussed with and reconfirmed by Dr.CHRIS DAVIS on 12/27/2013 at 12:05 am.   Electronically Signed   By: Elon Alas    On: 12/28/2013 00:07   Dg Chest 2 View  12/28/2013   CLINICAL DATA:  General weakness.  Difficulty standing.  Dizziness.  EXAM: CHEST  2 VIEW  COMPARISON:  Chest 07/27/2010.  CT chest 07/20/2010.  FINDINGS: Postoperative changes with sternotomy wires in the mediastinum and vascular markers present. Ectatic right-sided aortic arch and descending aorta with stent graft in place. Cardiac enlargement. No pulmonary vascular congestion. Linear atelectasis in the right lung base. No blunting of costophrenic angles. No pneumothorax. No focal consolidation in the lungs. Curvilinear scarring versus bulla in the left mid lung is unchanged. Degenerative changes in the spine.  IMPRESSION: No evidence of active pulmonary disease. Linear atelectasis in the right lung base. Right-sided aortic arch post stent graft.   Electronically Signed   By: Lucienne Capers M.D.   On: 12/28/2013 00:15   Dg Abd 1 View  12/28/2013   CLINICAL DATA:  Abdominal distention  EXAM: ABDOMEN - 1 VIEW  COMPARISON:  None.  FINDINGS: Postoperative changes in the chest. Residual contrast material in the renal collecting system and bladder. Rounded calcifications in the right upper quadrant likely representing gallstones. Vascular calcifications. Degenerative changes in the spine and hips. Scattered gas and stool in the colon. No small or large bowel distention.  IMPRESSION: Nonobstructive bowel gas pattern. Residual contrast material in the renal collecting system and bladder. Cholelithiasis.   Electronically Signed  By: Lucienne Capers M.D.   On: 12/28/2013 00:17   Ct Angio Neck W/cm &/or Wo/cm  12/28/2013   CLINICAL DATA:  Dizziness with abnormal gait, symptoms for 1 hr mildly hypertensive.  EXAM: CT ANGIOGRAPHY HEAD AND NECK  TECHNIQUE: Multi detector CT axial noncontrast CT images of the head. Multidetector CT imaging of the head and neck was performed using the standard protocol during bolus administration of intravenous contrast.  Multiplanar CT image reconstructions and MIPs were obtained to evaluate the vascular anatomy. Carotid stenosis measurements (when applicable) are obtained utilizing NASCET criteria, using the distal internal carotid diameter as the denominator.  CONTRAST:  48mL OMNIPAQUE IOHEXOL 350 MG/ML SOLN  COMPARISON:  CT of the chest July 20, 2010  FINDINGS: CT HEAD FINDINGS  No intraparenchymal hemorrhage, mass effect, midline shift. Remote LEFT basal ganglia infarct with ex vacuo dilatation of frontal horn of LEFT lateral ventricle. Subcentimeter RIGHT basal ganglia chronic appearing lacunar infarcts. Moderate generalized ventriculomegaly, likely on the basis of global parenchymal brain volume loss as there is overall commensurate enlargement of cerebral sulci and cerebellar folia. RIGHT mesial occipital lobe transcortical encephalomalacia, LEFT parietal transcortical encephalomalacia. Tiny LEFT and possibly RIGHT cerebellar infarct. Confluent supratentorial white matter hypodensities. No abnormal intraparenchymal or extra-axial enhancement.  No abnormal extra-axial fluid collections. Moderate calcific atherosclerosis of the carotid siphons. Subcentimeter calcifications within the insular branches of bilateral middle cerebral arteries and, along the A 2/3 bilateral anterior cerebral arteries. Basal cisterns are patent.  No skull fracture. The visualized paranasal sinuses and mastoid air cells are well aerated.  CTA HEAD FINDINGS  Anterior circulation: Widely patent cervical, features, cavernous and supra clinoid internal carotid arteries with moderate calcific atherosclerosis. Mildly ectatic appearance of the carotid internal carotid arteries and supra clinoid internal carotid arteries. Robust RIGHT and diminutive LEFT A1 segments with patent bilateral anterior and middle cerebral arteries. Calcified insular middle cerebral artery branches and anterior cerebral artery A2/3 segments. Irregular appearance of the RIGHT A 2/3  segments.  Posterior circulation: RIGHT vertebral artery is dominant with thready irregular LEFT V4 segment. Tortuous basilar artery, main branch vessels appear patent. Robust LEFT posterior communicating artery with compensatory diminutive LEFT P1 segment. Normal appearance of bilateral posterior cerebral arteries.  Ectatic intracranial vessels without aneurysm , large vessel occlusion, hemodynamically significant stenosis.  Poor dentition with multiple dental caries and periapical lucencies.  Review of the MIP images confirms the above findings.  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 Origin of the RIGHT Common carotid artery is not imaged. Retrograde apparent filling of the RIGHT subclavian artery, origin of LEFT subclavian artery not included.  Are all appearance of the bilateral Common carotid arteries which course in a straight line fashion appear widely patent. 1-2 mm eccentric calcific atherosclerosis of the RIGHT carotid bulb without hemodynamically significant stenosis of the internal carotid artery origins by NASCET criteria. Normal appearance of cervical internal carotid artery.  Origin the RIGHT vertebral artery is widely patent. Origin of the LEFT vertebral artery is not well identified and possibly occluded. However, there is mild contrast opacification that intra foraminal LEFT vertebral artery likely via muscular branches. Widely patent RIGHT vertebral artery which is dominant. Thready left V3 and V4 segments.  Included view of the chest demonstrates median sternotomy. 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 Straightened cervical lordosis.  Review of the MIP images confirms the above findings.  IMPRESSION: CT HEAD:  No definite acute intracranial process.  RIGHT posterior cerebral artery, LEFT middle cerebral artery territory of remote infarcts including LEFT basal  ganglia. Additional small basal ganglia and cerebellar infarcts.  Moderate white matter changes suggest chronic small vessel ischemic disease. Moderate global brain atrophy, advanced for age.  CTA HEAD:  No acute vascular process.  Thready irregular LEFT intradural vertebral artery likely reflecting intracranial atherosclerosis with similar findings of the RIGHT A2/3 segment.  Dolicoectatic intracranial vessels likely reflect chronic hypertension.  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 No contrast extravasation within the visualized portion of the thoracic aorta.  Chronically occluded origin LEFT vertebral artery, with patent foraminal segment likely via muscular branches.  No acute vascular process within the neck vessels, no hemodynamically significant stenosis.  Acute findings discussed with and reconfirmed by Dr.CHRIS DAVIS on 12/27/2013 at 12:05 am.   Electronically Signed   By: Elon Alas   On: 12/28/2013 00:07    EKG: Independently reviewed. normal sinus rhythm, nonspecific ST and T waves changes.  Assessment/Plan Principal Problem:   AKI (acute kidney injury) Active Problems:   Lactic acidosis   Cerebral embolism with cerebral infarction   Abnormal LFTs   Leucocytosis   Dizziness and giddiness   Abdominal mass   Loss of weight   1. Dizziness and lightheadedness The patient is presenting with complaints of dizziness and lightheadedness. CT head and neck angiogram is showing multiple prior strokes but no acute abnormality. Patient has been able to by neurology who thinks patient would have a TIA and recommended further workup. Patient will be monitored on telemetry and without pain further workup. MRI brain MRI brain echocardiogram carotid Doppler. Patient will be started on aspirin. PTOT and speech consultation will  also be done. Serial neuro checks will be done.  2. Essential hypertension. Continue patient's home medications.  3. Abdominal mass Abnormal LFT Acute kidney injury Weight loss The patient is presenting with complaints of weight loss. Diaphoresis and night sweats. Loss of weight. He is found to have mild acute kidney injury as well as mild elevation in his LFT. He appears to have an abdominal mass most likely hepatomegaly. At present I would obtain CT abdomen pelvis for further workup since he also appears to have leukocytosis.  4. Leukocytosis, elevated lactic acid level, toxic granulation. Patient coming in with dizziness but does not have any fever chest x-ray and abdominal x-ray as well as urinalysis are unremarkable. Blood culture obtained and follow workup.  Advance goals of care discussion: Full code   Consults: Neurology  DVT Prophylaxis: subcutaneous Heparin Nutrition: Nothing by mouth  Disposition: Admitted to inpatient in telemetry unit.  Author: Berle Mull, MD [facility] Pager: [phone] 12/28/2013, 6:43 AM    If 7PM-7AM, please contact night-coverage [email or web address] Password [email or web address]

## 2013-12-28 NOTE — Consult Note (Signed)
#   [id number] is consult note.  He probably needs to be off the ASA as he will need biopsies and invasive procedures.  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  He has BAD cholesterol issues, and bad cerebrovascular disease with multiple stenotic arteries.  I do not care what he goes on for the CNS issues. Maybe he should be on Heparin gtt for right now.  Once all his tests are done, then ASA might be appropriate.  pete

## 2013-12-28 NOTE — Progress Notes (Signed)
INITIAL NUTRITION ASSESSMENT  DOCUMENTATION CODES Per approved criteria  -Severe malnutrition in the context of chronic illness  Pt meets criteria for severe MALNUTRITION in the context of chronic illness as evidenced by 12.3% wt loss in <6 months and reported intake <75% of estimated needs for >1 month.  INTERVENTION: Once diet advanced, add Ensure Complete po BID, each supplement provides 350 kcal and 13 grams of protein  NUTRITION DIAGNOSIS: Inadequate oral intake related to poor appetite as evidenced by wt loss.   Goal: Pt to meet >/= 90% of their estimated nutrition needs   Monitor:  Weight trend, po intake, acceptance of supplements, labs  Reason for Assessment: MST  58 y.o. male  Admitting Dx: AKI (acute kidney injury)  ASSESSMENT: 58 y.o. male with Past medical history of hypertension, BPH. The patient is presenting with complaints of dizziness.  - Pt reports that he has had a 25 lb wt loss in 2 months. He says that he weighed 180 lbs less than 6 months ago. He reports that he usually skips breakfast, eats 1/2 sandwich for lunch, and eats a "decent-sized" dinner. He says that his appetite has been very poor at home, but he reports that he currently feels hungry. Pt likes ensure complete.  - Nutrition-focused physical exam not done due to pt having echo during visit.   Height: Ht Readings from Last 1 Encounters:  12/28/13 5\' 10"  (1.778 m)    Weight: Wt Readings from Last 1 Encounters:  12/28/13 158 lb 11.7 oz (72 kg)    Ideal Body Weight: 73 kg  % Ideal Body Weight: 99%  Wt Readings from Last 10 Encounters:  12/28/13 158 lb 11.7 oz (72 kg)    Usual Body Weight: 180 lbs  % Usual Body Weight: 88%  BMI:  Body mass index is 22.78 kg/(m^2).  Estimated Nutritional Needs: Kcal: 1800-2000 Protein: 95-110 g Fluid: 1.8-2.0 L/day  Skin: Intact  Diet Order: Diet clear liquid  EDUCATION NEEDS: -Education needs addressed   Intake/Output Summary (Last 24  hours) at 12/28/13 1110 Last data filed at 12/28/13 0847  Gross per 24 hour  Intake   1000 ml  Output   1045 ml  Net    -45 ml    Last BM: prior to admission   Labs:   Recent Labs Lab 12/27/13 2127 12/28/13 0334  NA 135* 137  K 3.7 3.2*  CL 93* 97  CO2 21 23  BUN 24* 23  CREATININE 1.56* 1.28  CALCIUM 9.0 8.5  GLUCOSE 104* 93    CBG (last 3)   Recent Labs  12/28/13 0339 12/28/13 0807  GLUCAP 98 94    Scheduled Meds: . amLODipine  5 mg Oral Daily  . aspirin  325 mg Oral Daily  . metoprolol succinate  50 mg Oral Daily  . pantoprazole  40 mg Oral Daily  . potassium chloride  40 mEq Oral Q4H    Continuous Infusions: . sodium chloride 100 mL/hr at 12/28/13 9147    Past Medical History  Diagnosis Date  . Hypertension     Past Surgical History  Procedure Laterality Date  . Coronary artery bypass graft      Laurette Schimke MS, RD, LDN

## 2013-12-28 NOTE — Plan of Care (Signed)
Problem: Progression Outcomes Goal: Bowel & Bladder Continence Outcome: Completed/Met Date Met:  12/28/13

## 2013-12-28 NOTE — Progress Notes (Signed)
Chaplain responded to spiritual care consult. Pt and chaplain spoke about several changing factors in his life, including his marriage being in the process of "breaking up." Pt admitted that he feels "depressed." This is a new realization and he has not sought any counseling or other form of treatment. Chaplain offered to keep pt in her prayers and this was appreciated by pt. Chaplain recommends follow up by unit chaplain next week. Chaplain made pt aware of spiritual care services should he need them before follow up. Page chaplain as needed.    12/28/13 1000  Clinical Encounter Type  Visited With Patient  Visit Type Initial;Spiritual support  Referral From Nurse  Consult/Referral To Chaplain  Recommendations Follow Up  Spiritual Encounters  Spiritual Needs Emotional;Prayer  Stress Factors  Patient Stress Factors Family relationships;Health changes;Lack of knowledge;Major life changes  Rolly Salter, Chaplain 12/28/2013 10:13 AM

## 2013-12-28 NOTE — Progress Notes (Signed)
Patient seen and examined. Admitted after midnight secondary to dizziness spells. Patient reports having this symptom on/off; but yesterday was the worse. He also reported swelling of his abdomen and difficulty controlling BP. Neurology was consulted and recommended to complete work up for TIA. Patient has passed swallowing evaluation. Please referred to Dr. Serita Grit H&P for further info/details on admission.  Plan: -slowly advance diet -follow CT abd and pelvis (to assess for masses and abd abnormalities) -complete work up for TIA -follow neurology rec's  Barton Dubois [phone]

## 2013-12-28 NOTE — Progress Notes (Signed)
STROKE TEAM PROGRESS NOTE   HISTORY [name] a 58 y.o. male presents complaining of dizziness. Describes it as feeling unsteady on his feet. Denies a true vertigo sensation. Notes this episode started this morning and is more severe, had 2 falls while at home. Reports feeling like he wanted to fall back and to the right with standing. Has had similar episodes for the past few months but those were not as severe as this. Reports feeling ok as long as he is not trying to walk. Denies any focal motor or sensory changes. No visual changes. No headache.   He does note that his symptoms have mostly resolved and he feels more back to his baseline. + history of HTN and HLD, no DM. He is not on a daily ASA.   Labs in ED pertinent for lactic acid of 4.03, WBC 15.1. CT head and angiogram of head and neck images reviewed and pertinent for "left MCA territory of remote infarct...additional small basal ganglia and cerebellar infarcts. Thready irregular left indradural vertebral artery likely reflecting intracranial atherosclerosis with similar findings of the right A2/3 segment. 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 He stated that he for the last 2-3 month has lost tramendous weight and he did not know why. He just noticed that his belly is much bigger and hard. He felt sudden dizziness but not vertigo 2 days ago at night, he can not stand and unsteady on walking, he has to sit down for 2-3 hours until it was subsided. He once a while also has mild lightheadedness for the last 3-4 months but very mild and he though it is related to his BP meds. He denies any speech changes, double vision, falling, N/V during the episodes. He stated that since 2 days ago, he does not have any episodes like this.    OBJECTIVE Temp:   [98.4 F (36.9 C)-99.2 F (37.3 C)] 99.2 F (37.3 C) (11/20 1828) Pulse Rate:  [74-109] 87 (11/20 1828) Cardiac Rhythm:  [-] Normal sinus rhythm (11/20 0816) Resp:  [13-43] 18 (11/20 1828) BP: (139-163)/(90-113) 159/96 mmHg (11/20 1828) SpO2:  [97 %-100 %] 100 % (11/20 1828) Weight:  [158 lb 11.7 oz (72 kg)-160 lb (72.576 kg)] 158 lb 11.7 oz (72 kg) (11/20 0302)   Recent Labs Lab 12/28/13 0339 12/28/13 0807 12/28/13 1400 12/28/13 1702  GLUCAP 98 94 97 86    Recent Labs Lab 12/27/13 2127 12/28/13 0334  NA 135* 137  K 3.7 3.2*  CL 93* 97  CO2 21 23  GLUCOSE 104* 93  BUN 24* 23  CREATININE 1.56* 1.28  CALCIUM 9.0 8.5    Recent Labs Lab 12/27/13 2127 12/28/13 0334  AST 73* 69*  ALT 21 19  ALKPHOS 330* 279*  BILITOT 0.6 0.5  PROT 7.9 6.9  ALBUMIN 2.7* 2.4*    Recent Labs Lab 12/27/13 2127 12/28/13 0334  WBC 15.1* 13.5*  NEUTROABS 12.3* 9.5*  HGB 10.9* 9.8*  HCT 32.3* 29.3*  MCV 77.1* 79.6  PLT 353 289   No results for input(s): CKTOTAL, CKMB, CKMBINDEX, TROPONINI in the last 168 hours.  Recent Labs  12/28/13 0334  LABPROT 15.4*  INR 1.20    Recent Labs  12/28/13 0400  COLORURINE YELLOW  LABSPEC 1.019  PHURINE 6.5  GLUCOSEU NEGATIVE  HGBUR TRACE*  BILIRUBINUR  NEGATIVE  KETONESUR NEGATIVE  PROTEINUR NEGATIVE  UROBILINOGEN 1.0  NITRITE NEGATIVE  LEUKOCYTESUR NEGATIVE       Component Value Date/Time   CHOL 328* 12/28/2013 0334   TRIG 150* 12/28/2013 0334   HDL 17* 12/28/2013 0334   CHOLHDL 19.3 12/28/2013 0334   VLDL 30 12/28/2013 0334   LDLCALC 281* 12/28/2013 0334   Lab Results  Component Value Date   HGBA1C 5.5 12/28/2013      Component Value Date/Time   LABOPIA NONE DETECTED 12/28/2013 0400   COCAINSCRNUR NONE DETECTED 12/28/2013 0400   LABBENZ NONE DETECTED 12/28/2013 0400   AMPHETMU NONE DETECTED 12/28/2013 0400   THCU POSITIVE* 12/28/2013 0400   LABBARB NONE DETECTED 12/28/2013 0400     Recent Labs Lab  12/28/13 0334  ETH <11    2-D echocardiogram Left ventricle: The cavity size was normal. There was severe concentric hypertrophy. 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 Systolic function was vigorous. The estimated ejection fraction was in the range of 65% to 70%. There was no dynamic obstruction. Wall motion was normal; there were no regional wall motion abnormalities. Doppler parameters are consistent with abnormal left ventricular relaxation (grade 1 diastolic dysfunction). Doppler parameters are consistent with high ventricular filling pressure. - Mitral valve: Calcified annulus. - Left atrium: The atrium was mildly dilated. - Tricuspid valve: There was moderate regurgitation. - Pulmonary arteries: Systolic pressure was mildly increased. PA peak pressure: 34 mm Hg (S). - Pericardium, extracardiac: A trivial pericardial effusion was identified.  Ct Angio Head W/cm &/or Wo Cm 12/28/2013    CT HEAD:  No definite acute intracranial process.   RIGHT posterior cerebral artery, LEFT middle cerebral artery territory of remote infarcts including LEFT basal ganglia. Additional small basal ganglia and cerebellar infarcts.  Moderate white matter changes suggest chronic small vessel ischemic disease. Moderate global brain atrophy, advanced for age.   CTA HEAD:  No acute vascular process.  Thready irregular LEFT intradural vertebral artery likely reflecting intracranial atherosclerosis with similar findings of the RIGHT A2/3 segment.  Dolicoectatic intracranial vessels likely reflect chronic hypertension.   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 No contrast extravasation within the visualized portion of the thoracic aorta.  Chronically occluded origin LEFT vertebral artery, with patent  foraminal segment likely via muscular branches.  No acute vascular process within the neck vessels, no hemodynamically significant stenosis.   Dg Chest 2 View 12/28/2013    No evidence of active pulmonary disease. Linear atelectasis in the right lung base. Right-sided aortic arch post stent graft.     Dg Abd 1 View 12/28/2013    Nonobstructive bowel gas pattern. Residual contrast material in the renal collecting system and bladder. Cholelithiasis.     Mri / MRA Brain Without Contrast 12/28/2013    1. No significant sized acute or subacute infarct. Subcentimeter foci of abnormal diffusion-weighted signal in the left corona radiata and high right frontal lobe may reflect subacute embolic/small vessel infarcts.  2. 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  3. No significant flow related enhancement identified in the visualized distal left vertebral artery. This session to be very small but patent on the recent CTA.  4. Mild-to-moderate proximal right P2 stenosis.  5. Ectatic distal internal carotid arteries.  6. Moderate to severe bilateral A2/A3 stenoses.    Ct Abdomen Pelvis W Contrast 12/28/2013  1. No acute finding.  2. Liver is enlarged with numerous ill-defined hypo attenuating and mixed attenuation lesions. There is a 7 cm length of sigmoid colon that is diffusely thickened, consistent with a primary colon malignancy. Metastatic disease of liver is suspected. Liver abnormalities could potentially reflect multifocal hepatic cellular carcinoma.  3. Small amount of ascites. No other evidence of metastatic disease. Nodes.     PHYSICAL EXAM Physical exam  Temp:  [98.4 F (36.9 C)-99.2 F (37.3 C)] 99.2 F (37.3 C) (11/20 1828) Pulse Rate:  [74-109] 87 (11/20 1828) Resp:  [13-43] 18 (11/20 1828) BP: (139-163)/(90-113) 159/96 mmHg (11/20 1828) SpO2:  [97 %-100 %] 100 % (11/20 1828) Weight:  [158 lb 11.7 oz (72 kg)-160 lb (72.576 kg)] 158 lb 11.7 oz (72  kg) (11/20 0302)  General - thin, well developed, in no apparent distress.  Ophthalmologic - not able to see through.  Cardiovascular - Regular rate and rhythm with systolic murmur grade III.  Mental Status -  Level of arousal and orientation to time, place, and person were intact. Language including expression, naming, repetition, comprehension was assessed and found intact. Fund of Knowledge was assessed and was impaired with knowing only current president.  Cranial Nerves II - XII - II - Visual field intact OU. III, IV, VI - Extraocular movements intact. V - Facial sensation intact bilaterally. VII - Facial movement intact bilaterally. VIII - Hearing & vestibular intact bilaterally. X - Palate elevates symmetrically. XI - Chin turning & shoulder shrug intact bilaterally. XII - Tongue protrusion intact.  Motor Strength - The patient's strength was normal in all extremities and pronator drift was absent.  Bulk was normal and fasciculations were absent.   Motor Tone - Muscle tone was assessed at the neck and appendages and was normal.  Reflexes - The patient's reflexes were 1+ in all extremities and he had no pathological reflexes.  Sensory - Light touch, temperature/pinprick were assessed and were normal.    Coordination - The patient had normal movements in the hands and feet with no ataxia or dysmetria.  Tremor was absent.  Gait and Station - deferred due to generalized weakness.  ASSESSMENT/PLAN Mr. Ralf Konopka is a 58 y.o. male with history of hypertension, previous CABG, hyperlipidemia, and previous strokes presenting with dizziness not vertigo 2 days ago lasting 2-3 hours.  He did not receive IV t-PA due to late presentation and resolution of deficits.  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 Recommend anticoagulation for stroke  prevention.  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 From literature, therapeutic lovenox is more effective than coumadin, but due to cost and invasive, we recommend in the long run coumadin bridging with lovenox  Hypertension  Home meds:  Norvasc 5 mg daily and Toprol-XL 50 mg daily  Blood pressure elevated at times  Hyperlipidemia  Home  meds:  Zocor 20 mg daily not resumed in hospital  LDL 281, goal < 70  Due to liver metastasis and mildly increased liver enzyme, do not recommend to resume statin at this time. Please work up for malignancy first  Other Stroke Risk Factors  ETOH use  Hx stroke/TIA  Coronary artery disease  Other Active Problems  CT of abdomen - consistent with a primary colon malignancy. Metastatic disease of liver is suspected.  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 Please call with questions.  Thanks for the consult.  Rosalin Hawking, MD PhD Stroke Neurology 12/28/2013 6:46 PM  To contact Stroke Continuity provider, please refer to [email or web address] After hours, contact General Neurology

## 2013-12-28 NOTE — Plan of Care (Signed)
Problem: Phase I Progression Outcomes Goal: Pain controlled with appropriate interventions Outcome: Completed/Met Date Met:  12/28/13     

## 2013-12-28 NOTE — Progress Notes (Signed)
SLP Cancellation Note  Patient Details Name: [name] MRN: [id number] DOB: [birth year]   Cancelled treatment:        Pt. Out of room at present for procedure.  ST will continue efforts.  Orbie Pyo Ward.Ed CCC-SLP Pager [phone]     Houston Siren 12/28/2013, 1:50 PM

## 2013-12-28 NOTE — Plan of Care (Signed)
Problem: Acute Treatment Outcomes Goal: Airway maintained/protected Outcome: Completed/Met Date Met:  12/28/13

## 2013-12-28 NOTE — ED Notes (Signed)
Md notified of lactic acid of 4.03

## 2013-12-28 NOTE — Evaluation (Signed)
Physical Therapy Evaluation Patient Details Name: [name] MRN: [id number] DOB: 07-13-1955 Today's Date: 12/28/2013   History of Present Illness   [name] is a 58 y.o. male with Past medical history of hypertension, BPH.  Presented with dizziness recent falls x 2, and 25+# weight loss in 2 months.  Clinical Impression  Patient presents with decreased independence with mobility due to deficits listed in PT problem list. He will benefit from skilled PT in the acute setting to allow return home with intermittent assist from friends to second floor apartment.  Would benefit from HHPT safety eval.    Follow Up Recommendations Home health PT (safety eval)    Equipment Recommendations  Cane    Recommendations for Other Services       Precautions / Restrictions Precautions Precautions: Fall      Mobility  Bed Mobility Overal bed mobility: Modified Independent                Transfers Overall transfer level: Modified independent Equipment used: None                Ambulation/Gait Ambulation/Gait assistance: Min guard;Supervision Ambulation Distance (Feet): 200 Feet Assistive device: None Gait Pattern/deviations: Step-through pattern;Decreased stride length     General Gait Details: trunk hyperextended with distension in abdomen; slow pace, but mostly steady; denies dizziness today; had some last evening and has caused him to fall previously.  Stairs            Wheelchair Mobility    Modified Rankin (Stroke Patients Only)       Balance Overall balance assessment: Needs assistance   Sitting balance-Leahy Scale: Good       Standing balance-Leahy Scale: Good Standing balance comment: able to ambulate without support, no loss of balance, but mild instability present and generalized weakness that affects safety ambulating independent as well as episodes of light headedness which has caused falls in the past                              Pertinent Vitals/Pain Pain Assessment: No/denies pain (reports only abdominal firmness)    Home Living Family/patient expects to be discharged to:: Private residence Living Arrangements: Non-relatives/Friends   Type of Home: Apartment Home Access: Stairs to enter Entrance Stairs-Rails: Right Entrance Stairs-Number of Steps: 13 Home Layout: One level Home Equipment: None Additional Comments: staying with friends while going through issues with his marriage    Prior Function Level of Independence: Independent               Hand Dominance        Extremity/Trunk Assessment               Lower Extremity Assessment: RLE deficits/detail;LLE deficits/detail RLE Deficits / Details: AROM WFL; strength hip flexion 3+/5, knee extension 4/5, ankle DF 4+/5 LLE Deficits / Details: AROM WFL; strength hip flexion 3+/5, knee extension 4/5, ankle DF 4+/5     Communication   Communication: No difficulties  Cognition Arousal/Alertness: Awake/alert Behavior During Therapy: Flat affect Overall Cognitive Status: Within Functional Limits for tasks assessed                      General Comments      Exercises General Exercises - Lower Extremity Hip Flexion/Marching: Strengthening;Both;5 reps;Standing Heel Raises: Strengthening;5 reps;Standing;Both      Assessment/Plan    PT Assessment Patient needs continued PT services  PT Diagnosis Abnormality  of gait;Generalized weakness   PT Problem List Decreased strength;Decreased mobility;Decreased balance;Decreased activity tolerance;Decreased knowledge of use of DME  PT Treatment Interventions DME instruction;Gait training;Stair training;Functional mobility training;Therapeutic activities;Patient/family education;Balance training;Therapeutic exercise   PT Goals (Current goals can be found in the Care Plan section) Acute Rehab PT Goals Patient Stated Goal: To get stronger PT Goal Formulation: With patient Time For  Goal Achievement: 01/11/14 Potential to Achieve Goals: Good    Frequency Min 3X/week   Barriers to discharge Decreased caregiver support;Inaccessible home environment lives in second floor apartment with friends.  Likely will be mobile enough to return if mobility maintained during acute stay    Co-evaluation               End of Session Equipment Utilized During Treatment: Gait belt Activity Tolerance: Patient limited by fatigue Patient left: in bed;with call bell/phone within reach           Time: 0927-0948 PT Time Calculation (min) (ACUTE ONLY): 21 min   Charges:   PT Evaluation $Initial PT Evaluation Tier I: 1 Procedure PT Treatments $Gait Training: 8-22 mins   PT G Codes:          Dayshia Ballinas,CYNDI Jan 12, 2014, 10:36 AM Magda Kiel, [place] January 12, 2014

## 2013-12-28 NOTE — Plan of Care (Signed)
Problem: Progression Outcomes Goal: Pain controlled Outcome: Completed/Met Date Met:  12/28/13

## 2013-12-28 NOTE — ED Notes (Signed)
Pt attempted to use urinal independently; pt spilled urine allover floor; none was saved; House Keeping to clean room; Pt resting upright in chair

## 2013-12-29 DIAGNOSIS — R160 Hepatomegaly, not elsewhere classified: Secondary | ICD-10-CM | POA: Diagnosis present

## 2013-12-29 DIAGNOSIS — N179 Acute kidney failure, unspecified: Secondary | ICD-10-CM | POA: Diagnosis present

## 2013-12-29 DIAGNOSIS — E872 Acidosis: Secondary | ICD-10-CM | POA: Diagnosis present

## 2013-12-29 DIAGNOSIS — E876 Hypokalemia: Secondary | ICD-10-CM | POA: Diagnosis present

## 2013-12-29 DIAGNOSIS — E785 Hyperlipidemia, unspecified: Secondary | ICD-10-CM | POA: Diagnosis present

## 2013-12-29 DIAGNOSIS — I6340 Cerebral infarction due to embolism of unspecified cerebral artery: Secondary | ICD-10-CM | POA: Diagnosis present

## 2013-12-29 DIAGNOSIS — I10 Essential (primary) hypertension: Secondary | ICD-10-CM | POA: Diagnosis present

## 2013-12-29 DIAGNOSIS — Z951 Presence of aortocoronary bypass graft: Secondary | ICD-10-CM | POA: Diagnosis not present

## 2013-12-29 DIAGNOSIS — N400 Enlarged prostate without lower urinary tract symptoms: Secondary | ICD-10-CM | POA: Diagnosis present

## 2013-12-29 DIAGNOSIS — I739 Peripheral vascular disease, unspecified: Secondary | ICD-10-CM | POA: Diagnosis present

## 2013-12-29 DIAGNOSIS — Z8673 Personal history of transient ischemic attack (TIA), and cerebral infarction without residual deficits: Secondary | ICD-10-CM | POA: Diagnosis not present

## 2013-12-29 DIAGNOSIS — I2510 Atherosclerotic heart disease of native coronary artery without angina pectoris: Secondary | ICD-10-CM | POA: Diagnosis present

## 2013-12-29 DIAGNOSIS — C787 Secondary malignant neoplasm of liver and intrahepatic bile duct: Secondary | ICD-10-CM | POA: Diagnosis present

## 2013-12-29 DIAGNOSIS — Z79899 Other long term (current) drug therapy: Secondary | ICD-10-CM | POA: Diagnosis not present

## 2013-12-29 DIAGNOSIS — I350 Nonrheumatic aortic (valve) stenosis: Secondary | ICD-10-CM | POA: Diagnosis present

## 2013-12-29 DIAGNOSIS — D72829 Elevated white blood cell count, unspecified: Secondary | ICD-10-CM | POA: Diagnosis present

## 2013-12-29 DIAGNOSIS — E43 Unspecified severe protein-calorie malnutrition: Secondary | ICD-10-CM | POA: Diagnosis present

## 2013-12-29 DIAGNOSIS — N62 Hypertrophy of breast: Secondary | ICD-10-CM | POA: Diagnosis present

## 2013-12-29 DIAGNOSIS — C19 Malignant neoplasm of rectosigmoid junction: Secondary | ICD-10-CM | POA: Diagnosis present

## 2013-12-29 DIAGNOSIS — Z6822 Body mass index (BMI) 22.0-22.9, adult: Secondary | ICD-10-CM | POA: Diagnosis not present

## 2013-12-29 DIAGNOSIS — Z955 Presence of coronary angioplasty implant and graft: Secondary | ICD-10-CM | POA: Diagnosis not present

## 2013-12-29 DIAGNOSIS — Z22322 Carrier or suspected carrier of Methicillin resistant Staphylococcus aureus: Secondary | ICD-10-CM | POA: Diagnosis not present

## 2013-12-29 DIAGNOSIS — R42 Dizziness and giddiness: Secondary | ICD-10-CM | POA: Diagnosis present

## 2013-12-29 DIAGNOSIS — G4700 Insomnia, unspecified: Secondary | ICD-10-CM | POA: Diagnosis present

## 2013-12-29 DIAGNOSIS — F329 Major depressive disorder, single episode, unspecified: Secondary | ICD-10-CM | POA: Diagnosis present

## 2013-12-29 DIAGNOSIS — E871 Hypo-osmolality and hyponatremia: Secondary | ICD-10-CM | POA: Diagnosis present

## 2013-12-29 DIAGNOSIS — D638 Anemia in other chronic diseases classified elsewhere: Secondary | ICD-10-CM | POA: Diagnosis present

## 2013-12-29 LAB — CEA: CEA: 1213.2 ng/mL — ABNORMAL HIGH (ref 0.0–5.0)

## 2013-12-29 LAB — PREALBUMIN: Prealbumin: 5.5 mg/dL — ABNORMAL LOW (ref 17.0–34.0)

## 2013-12-29 LAB — GLUCOSE, CAPILLARY
GLUCOSE-CAPILLARY: 91 mg/dL (ref 70–99)
Glucose-Capillary: 92 mg/dL (ref 70–99)
Glucose-Capillary: 81 mg/dL (ref 70–99)
Glucose-Capillary: 86 mg/dL (ref 70–99)

## 2013-12-29 MED ORDER — ENOXAPARIN SODIUM 40 MG/0.4ML ~~LOC~~ SOLN
40.0000 mg | SUBCUTANEOUS | Status: DC
  Administered 2013-12-29: 40 mg via SUBCUTANEOUS
  Filled 2013-12-29 (×3): qty 0.4

## 2013-12-29 MED ORDER — PEG-KCL-NACL-NASULF-NA ASC-C 100 G PO SOLR: 1.0000 | Freq: Once | ORAL | Status: DC

## 2013-12-29 MED ORDER — PEG-KCL-NACL-NASULF-NA ASC-C 100 G PO SOLR
0.5000 | Freq: Once | ORAL | Status: AC
  Administered 2013-12-30: 100 g via ORAL

## 2013-12-29 MED ORDER — PEG-KCL-NACL-NASULF-NA ASC-C 100 G PO SOLR
0.5000 | Freq: Every day | ORAL | Status: DC
  Filled 2013-12-29 (×9): qty 1

## 2013-12-29 MED ORDER — PEG-KCL-NACL-NASULF-NA ASC-C 100 G PO SOLR: 0.5000 | Freq: Once | ORAL | Status: DC

## 2013-12-29 MED ORDER — INFLUENZA VAC SPLIT QUAD 0.5 ML IM SUSY
0.5000 mL | PREFILLED_SYRINGE | INTRAMUSCULAR | Status: AC
  Administered 2013-12-30: 0.5 mL via INTRAMUSCULAR
  Filled 2013-12-29: qty 0.5

## 2013-12-29 MED ORDER — PEG-KCL-NACL-NASULF-NA ASC-C 100 G PO SOLR
0.5000 | Freq: Once | ORAL | Status: AC
  Administered 2013-12-29: 100 g via ORAL
  Filled 2013-12-29: qty 1

## 2013-12-29 MED ORDER — ISOSORB DINITRATE-HYDRALAZINE 20-37.5 MG PO TABS
1.0000 | ORAL_TABLET | Freq: Two times a day (BID) | ORAL | Status: DC
  Administered 2013-12-29 – 2014-01-01 (×6): 1 via ORAL
  Filled 2013-12-29 (×7): qty 1

## 2013-12-29 MED ORDER — CITALOPRAM HYDROBROMIDE 10 MG PO TABS
10.0000 mg | ORAL_TABLET | Freq: Every day | ORAL | Status: DC
  Administered 2013-12-29 – 2014-01-11 (×13): 10 mg via ORAL
  Filled 2013-12-29 (×16): qty 1

## 2013-12-29 MED ORDER — SIMVASTATIN 20 MG PO TABS: 20.0000 mg | ORAL_TABLET | Freq: Every day | ORAL | Status: DC

## 2013-12-29 NOTE — Progress Notes (Signed)
Utilization Review completed.  

## 2013-12-29 NOTE — Progress Notes (Signed)
    Progress Note   Subjective  Depressed about news of probable cancer   Objective   Vital signs in last 24 hours: Temp:  [98.5 F (36.9 C)-99.6 F (37.6 C)] 99.6 F (37.6 C) (11/21 0622) Pulse Rate:  [75-93] 79 (11/21 0622) Resp:  [18-22] 18 (11/21 0622) BP: (139-185)/(90-120) 164/96 mmHg (11/21 0622) SpO2:  [100 %] 100 % (11/21 0622) Last BM Date: 12/27/13 General:    Pleasant male in NAD Abdomen:  Soft, nontender. Normal bowel sounds. Extremities:  Without edema. Neurologic:  Alert and oriented,  grossly normal neurologically. Psych:  Cooperative. Normal mood and affect. Lab Results:  Recent Labs  12/27/13 2127 12/28/13 0334  WBC 15.1* 13.5*  HGB 10.9* 9.8*  HCT 32.3* 29.3*  PLT 353 289   BMET  Recent Labs  12/27/13 2127 12/28/13 0334  NA 135* 137  K 3.7 3.2*  CL 93* 97  CO2 21 23  GLUCOSE 104* 93  BUN 24* 23  CREATININE 1.56* 1.28  CALCIUM 9.0 8.5   LFT  Recent Labs  12/28/13 0334  PROT 6.9  ALBUMIN 2.4*  AST 69*  ALT 19  ALKPHOS 279*  BILITOT 0.5   PT/INR  Recent Labs  12/28/13 0334  LABPROT 15.4*  INR 1.20     Assessment / Plan:   53. 58 year old male with large sigmoid mass and multiple liver lesions on CTscan. He is agreeable to colonoscopy in am.  Clear diet today.   2. Hx of CVAs. Admitted with slurred speech, dizziness. Imaging shows subacute embolic/small vessel infarcts. Neurology following   LOS: 2 days   Tye Savoy  12/29/2013, 9:01 AM  Attending MD note:   I have taken a history, examined the patient,  I agree with the Advanced Practitioner's impression and recommendations. For colonoscopy tomorrow Prep ordered  [facility] Pager # [phone]

## 2013-12-29 NOTE — Clinical Social Work Psychosocial (Signed)
Clinical Social Work Department BRIEF PSYCHOSOCIAL ASSESSMENT 12/29/2013  Patient:  [name], [name]     Account Number:  [id number]     Admit date:  12/27/2013  Clinical Social Worker:  Hubert Azure  Date/Time:  12/29/2013 07:49 PM  Referred by:  Physician  Date Referred:  12/29/2013 Referred for  Other - See comment   Other Referral:   Responses on suicide screening   Interview type:  Patient Other interview type:    PSYCHOSOCIAL DATA Living Status:  ALONE Admitted from facility:   Level of care:   Primary support name:  Coletta Memos Primary support relationship to patient:  SPOUSE Degree of support available:   Fair    CURRENT CONCERNS Current Concerns  Other - See comment   Other Concerns:   Crisis intervention    SOCIAL WORK ASSESSMENT / PLAN CSW received consult regarding suicide screening and met with patient who was sitting in chair. CSW introduced self and explained role. Patient was alert and oriented. Patient was cooperative, but presented with flat affect. Patient stated he has received a lot of bad news over the last 24-48 hours. Patient reports news indicating he is "basically dying." Patient reports with chemo and medication he will be able to treat, but not cure his symptoms. Patient states he knows he is "depressed," but has not intentions of harming himself. Patient denied SI and states all he can do is deal with it one day at a time. Patient denied any spiritual supports and states all of his family reside in [place] or [place]. Patient reports his wife "ex-wife" is his support in [place] and she is to visit with him today at some point in time as she has to work. Patient expressed very little confidence in wife and states they are still married, but separated and he has an adult daughter. Patient declined CSW resources at this time, but is agreeable to ongoing support. CSW provided appropriate emotional support and to continue to follow  patient.   Assessment/plan status:  Psychosocial Support/Ongoing Assessment of Needs Other assessment/ plan:   Information/referral to community resources:    PATIENT'S/FAMILY'S RESPONSE TO PLAN OF CARE: Patient reports he has no choice but to deal with his current medical condition. Patient states he has no intention of ending his own life and would prefer to live his life until it is no more.   [place], [facility] Weekend Clinical Social Worker [phone]

## 2013-12-29 NOTE — Progress Notes (Signed)
Notified night MD current telemetry status.

## 2013-12-29 NOTE — Progress Notes (Addendum)
[facility] PROGRESS NOTE  [name] GPQ:[id number] DOB: [birth year] DOA: 12/27/2013 PCP: No primary care provider on file.  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 -continue current medications  4-HLD: not at goal -LDL elevated; statins on hold due to transaminatis and metastatic liver disease -advise to follow low fat diet  5-depression: due to newly diagnose malignancy -will start celexa  Code Status: Full Family Communication: no family at bedside  Disposition Plan: to be determine    Consultants:  GI  Neurology  Oncology   Procedures:  See below for x-ray reports  Colonoscopy (planned for 11/21)  Antibiotics:  None   HPI/Subjective: Patient is a little down with concerns of ongoing diagnosis; denies CP, fever, SOB, nausea and vomiting. He understand that his condition might be treatable but not cured.   Objective: Filed Vitals:   12/29/13 1318  BP: 127/89  Pulse: 72  Temp: 97.8 F (36.6 C)  Resp: 16    Intake/Output Summary (Last 24 hours) at 12/29/13 1647 Last data filed at 12/29/13 1019  Gross per 24 hour  Intake   1560 ml  Output   2075 ml  Net   -515 ml   Filed Weights   12/27/13 2125 12/28/13 0302  Weight: 72.576 kg (160 lb) 72 kg (158 lb 11.7 oz)    Exam:   General:  AAOX3, afebrile, no CP or SOB; patient denies nausea or vomiting  Cardiovascular: positive SEM, no rubs or  gallops  Respiratory: good air movement, no wheezing, no crackles  Abdomen: distended, with palpable mass mid/left quadrant; also hepatomegaly appreciated on exam. Positive BS  Musculoskeletal: no edema or cyanosis   Data Reviewed: Basic Metabolic Panel:  Recent Labs Lab 12/27/13 2127 12/28/13 0334  NA 135* 137  K 3.7 3.2*  CL 93* 97  CO2 21 23  GLUCOSE 104* 93  BUN 24* 23  CREATININE 1.56* 1.28  CALCIUM 9.0 8.5   Liver Function Tests:  Recent Labs Lab 12/27/13 2127 12/28/13 0334  AST 73* 69*  ALT 21 19  ALKPHOS 330* 279*  BILITOT 0.6 0.5  PROT 7.9 6.9  ALBUMIN 2.7* 2.4*   CBC:  Recent Labs Lab 12/27/13 2127 12/28/13 0334  WBC 15.1* 13.5*  NEUTROABS 12.3* 9.5*  HGB 10.9* 9.8*  HCT 32.3* 29.3*  MCV 77.1* 79.6  PLT 353 289   CBG:  Recent Labs Lab 12/28/13 1400 12/28/13 1702 12/28/13 2059 12/29/13 0746 12/29/13 1202  GLUCAP 97 86 111* 92 81    Recent Results (from the past 240 hour(s))  Culture, blood (routine x 2)     Status: None (Preliminary result)   Collection Time: 12/28/13  3:34 AM  Result Value Ref Range Status   Specimen Description BLOOD RIGHT ARM  Final   Special Requests BOTTLES DRAWN AEROBIC AND ANAEROBIC 10 CC  Final   Culture  Setup Time   Final    12/28/2013 09:26 Performed at [place] [facility]  NO GROWTH TO DATE CULTURE WILL BE HELD FOR 5 DAYS BEFORE ISSUING A FINAL NEGATIVE REPORT Performed at [facility]    Report Status PENDING  Incomplete  Culture, blood (routine x 2)     Status: None (Preliminary result)   Collection Time: 12/28/13  3:46 AM  Result Value Ref Range Status   Specimen Description BLOOD LEFT FOREARM  Final   Special Requests BOTTLES DRAWN AEROBIC AND ANAEROBIC 10 CC  Final   Culture  Setup Time   Final    12/28/2013 09:26 Performed at [facility]    Culture   Final           BLOOD CULTURE RECEIVED NO GROWTH TO DATE CULTURE WILL  BE HELD FOR 5 DAYS BEFORE ISSUING A FINAL NEGATIVE REPORT Performed at [facility]    Report Status PENDING  Incomplete     Studies: Ct Angio Head W/cm &/or Wo Cm  12/28/2013   CLINICAL DATA:  Dizziness with abnormal gait, symptoms for 1 hr mildly hypertensive.  EXAM: CT ANGIOGRAPHY HEAD AND NECK  TECHNIQUE: Multi detector CT axial noncontrast CT images of the head. Multidetector CT imaging of the head and neck was performed using the standard protocol during bolus administration of intravenous contrast. Multiplanar CT image reconstructions and MIPs were obtained to evaluate the vascular anatomy. Carotid stenosis measurements (when applicable) are obtained utilizing NASCET criteria, using the distal internal carotid diameter as the denominator.  CONTRAST:  47m OMNIPAQUE IOHEXOL 350 MG/ML SOLN  COMPARISON:  CT of the chest July 20, 2010  FINDINGS: CT HEAD FINDINGS  No intraparenchymal hemorrhage, mass effect, midline shift. Remote LEFT basal ganglia infarct with ex vacuo dilatation of frontal horn of LEFT lateral ventricle. Subcentimeter RIGHT basal ganglia chronic appearing lacunar infarcts. Moderate generalized ventriculomegaly, likely on the basis of global parenchymal brain volume loss as there is overall commensurate enlargement of cerebral sulci and cerebellar folia. RIGHT mesial occipital lobe transcortical encephalomalacia, LEFT parietal transcortical encephalomalacia. Tiny LEFT and possibly RIGHT cerebellar infarct. Confluent supratentorial white matter hypodensities. No abnormal intraparenchymal or extra-axial enhancement.  No abnormal extra-axial fluid collections. Moderate calcific atherosclerosis of the carotid siphons. Subcentimeter calcifications within the insular branches of bilateral middle cerebral arteries and, along the A 2/3 bilateral anterior cerebral arteries. Basal cisterns are patent.  No skull fracture. The visualized paranasal sinuses and mastoid air cells are well  aerated.  CTA HEAD FINDINGS  Anterior circulation: Widely patent cervical, features, cavernous and supra clinoid internal carotid arteries with moderate calcific atherosclerosis. Mildly ectatic appearance of the carotid internal carotid arteries and supra clinoid internal carotid arteries. Robust RIGHT and diminutive LEFT A1 segments with patent bilateral anterior and middle cerebral arteries. Calcified insular middle cerebral artery branches and anterior cerebral artery A2/3 segments. Irregular appearance of the RIGHT A 2/3 segments.  Posterior circulation: RIGHT vertebral artery is dominant with thready irregular LEFT V4 segment. Tortuous basilar artery, main branch vessels appear patent. Robust LEFT posterior communicating artery with compensatory diminutive LEFT P1 segment. Normal appearance of bilateral posterior cerebral arteries.  Ectatic intracranial vessels without aneurysm , large vessel occlusion, hemodynamically significant stenosis.  Poor dentition with multiple dental caries and periapical lucencies.  Review of the MIP images confirms the above findings.  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 Origin of the RIGHT Common carotid artery is not imaged. Retrograde apparent filling of the RIGHT subclavian artery, origin  of LEFT subclavian artery not included.  Are all appearance of the bilateral Common carotid arteries which course in a straight line fashion appear widely patent. 1-2 mm eccentric calcific atherosclerosis of the RIGHT carotid bulb without hemodynamically significant stenosis of the internal carotid artery origins by NASCET criteria. Normal appearance of cervical internal carotid artery.  Origin the RIGHT vertebral artery is widely patent. Origin of the LEFT vertebral artery is not well identified and possibly occluded. However, there is mild contrast  opacification that intra foraminal LEFT vertebral artery likely via muscular branches. Widely patent RIGHT vertebral artery which is dominant. Thready left V3 and V4 segments.  Included view of the chest demonstrates median sternotomy. 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 Straightened cervical lordosis.  Review of the MIP images confirms the above findings.  IMPRESSION: CT HEAD:  No definite acute intracranial process.  RIGHT posterior cerebral artery, LEFT middle cerebral artery territory of remote infarcts including LEFT basal ganglia. Additional small basal ganglia and cerebellar infarcts.  Moderate white matter changes suggest chronic small vessel ischemic disease. Moderate global brain atrophy, advanced for age.  CTA HEAD:  No acute vascular process.  Thready irregular LEFT intradural vertebral artery likely reflecting intracranial atherosclerosis with similar findings of the RIGHT A2/3 segment.  Dolicoectatic intracranial vessels likely reflect chronic hypertension.  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 No contrast extravasation within the visualized portion of the thoracic aorta.  Chronically occluded origin LEFT vertebral artery, with patent foraminal segment likely via muscular branches.  No acute vascular process within the neck vessels, no hemodynamically significant stenosis.  Acute findings discussed with and reconfirmed by Dr.CHRIS DAVIS on 12/27/2013 at 12:05 am.   Electronically Signed   By: Elon Alas   On: 12/28/2013 00:07   Dg Chest 2 View  12/28/2013   CLINICAL DATA:  General weakness.  Difficulty standing.  Dizziness.  EXAM: CHEST  2 VIEW  COMPARISON:  Chest 07/27/2010.  CT chest 07/20/2010.  FINDINGS: Postoperative changes with sternotomy wires in the mediastinum and vascular markers present. Ectatic right-sided aortic arch and  descending aorta with stent graft in place. Cardiac enlargement. No pulmonary vascular congestion. Linear atelectasis in the right lung base. No blunting of costophrenic angles. No pneumothorax. No focal consolidation in the lungs. Curvilinear scarring versus bulla in the left mid lung is unchanged. Degenerative changes in the spine.  IMPRESSION: No evidence of active pulmonary disease. Linear atelectasis in the right lung base. Right-sided aortic arch post stent graft.   Electronically Signed   By: Lucienne Capers M.D.   On: 12/28/2013 00:15   Dg Abd 1 View  12/28/2013   CLINICAL DATA:  Abdominal distention  EXAM: ABDOMEN - 1 VIEW  COMPARISON:  None.  FINDINGS: Postoperative changes in the chest. Residual contrast material in the renal collecting system and bladder. Rounded calcifications in the right upper quadrant likely representing gallstones. Vascular calcifications. Degenerative changes in the spine and hips. Scattered gas and stool in the colon. No small or large bowel distention.  IMPRESSION: Nonobstructive bowel gas pattern. Residual contrast material in the renal collecting system and bladder. Cholelithiasis.   Electronically Signed   By: Lucienne Capers M.D.   On: 12/28/2013 00:17   Ct Angio Neck W/cm &/or Wo/cm  12/28/2013   CLINICAL DATA:  Dizziness with abnormal gait, symptoms for 1 hr mildly hypertensive.  EXAM: CT ANGIOGRAPHY HEAD AND NECK  TECHNIQUE: Multi detector CT axial noncontrast CT images of the head. Multidetector CT imaging of the head and neck was performed using the standard protocol during bolus administration of intravenous contrast. Multiplanar CT image reconstructions and MIPs were obtained to evaluate the vascular anatomy. Carotid stenosis measurements (when applicable) are obtained utilizing NASCET criteria, using the distal internal carotid diameter as the denominator.  CONTRAST:  47m OMNIPAQUE IOHEXOL 350 MG/ML SOLN  COMPARISON:  CT of the chest July 20, 2010   FINDINGS: CT HEAD FINDINGS  No intraparenchymal hemorrhage, mass effect, midline shift. Remote LEFT basal ganglia infarct with ex vacuo dilatation of frontal horn of LEFT lateral ventricle. Subcentimeter RIGHT basal ganglia chronic appearing lacunar infarcts. Moderate generalized ventriculomegaly, likely on the basis of global parenchymal brain volume loss as there is overall commensurate enlargement of cerebral sulci and cerebellar folia. RIGHT mesial occipital lobe transcortical encephalomalacia, LEFT parietal transcortical encephalomalacia. Tiny LEFT and possibly RIGHT cerebellar infarct. Confluent supratentorial white matter hypodensities. No abnormal intraparenchymal or extra-axial enhancement.  No abnormal extra-axial fluid collections. Moderate calcific atherosclerosis of the carotid siphons. Subcentimeter calcifications within the insular branches of bilateral middle cerebral arteries and, along the A 2/3 bilateral anterior cerebral arteries. Basal cisterns are patent.  No skull fracture. The visualized paranasal sinuses and mastoid air cells are well aerated.  CTA HEAD FINDINGS  Anterior circulation: Widely patent cervical, features, cavernous and supra clinoid internal carotid arteries with moderate calcific atherosclerosis. Mildly ectatic appearance of the carotid internal carotid arteries and supra clinoid internal carotid arteries. Robust RIGHT and diminutive LEFT A1 segments with patent bilateral anterior and middle cerebral arteries. Calcified insular middle cerebral artery branches and anterior cerebral artery A2/3 segments. Irregular appearance of the RIGHT A 2/3 segments.  Posterior circulation: RIGHT vertebral artery is dominant with thready irregular LEFT V4 segment. Tortuous basilar artery, main branch vessels appear patent. Robust LEFT posterior communicating artery with compensatory diminutive LEFT P1 segment. Normal appearance of bilateral posterior cerebral arteries.  Ectatic intracranial  vessels without aneurysm , large vessel occlusion, hemodynamically significant stenosis.  Poor dentition with multiple dental caries and periapical lucencies.  Review of the MIP images confirms the above findings.  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 Origin of the RIGHT Common carotid artery is not imaged. Retrograde apparent filling of the RIGHT subclavian artery, origin of LEFT subclavian artery not included.  Are all appearance of the bilateral Common carotid arteries which course in a straight line fashion appear widely patent. 1-2 mm eccentric calcific atherosclerosis of the RIGHT carotid bulb without hemodynamically significant stenosis of the internal carotid artery origins by NASCET criteria. Normal appearance of cervical internal carotid artery.  Origin the RIGHT vertebral artery is widely patent. Origin of the LEFT vertebral artery is not well identified and possibly occluded. However, there is mild contrast opacification that intra foraminal LEFT vertebral artery likely via muscular branches. Widely patent RIGHT vertebral artery which is dominant. Thready left V3 and V4 segments.  Included view of the chest demonstrates median sternotomy. 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 Straightened cervical lordosis.  Review of the MIP images confirms the above findings.  IMPRESSION: CT HEAD:  No definite acute intracranial process.  RIGHT posterior cerebral artery, LEFT middle cerebral artery territory of remote infarcts including LEFT basal ganglia. Additional small basal ganglia and cerebellar infarcts.  Moderate white matter changes suggest chronic small vessel ischemic disease. Moderate global brain atrophy, advanced for age.  CTA HEAD:  No acute vascular process.  Thready irregular LEFT intradural vertebral artery likely reflecting intracranial atherosclerosis with similar  findings of the RIGHT A2/3 segment.  Dolicoectatic intracranial vessels likely reflect chronic hypertension.  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 No contrast extravasation within the visualized portion of the thoracic aorta.  Chronically occluded origin LEFT vertebral artery, with patent foraminal segment likely via muscular branches.  No acute vascular process within the neck vessels, no hemodynamically significant stenosis.  Acute findings discussed with and reconfirmed by Dr.CHRIS DAVIS on 12/27/2013 at 12:05 am.   Electronically Signed   By: Elon Alas   On: 12/28/2013 00:07   Mri Brain Without Contrast  12/28/2013   CLINICAL DATA:  Chronic dizziness for 2 months worsening in the past week. Two falls.  EXAM: MRI HEAD WITHOUT CONTRAST  MRA HEAD WITHOUT CONTRAST  TECHNIQUE: Multiplanar, multiecho pulse sequences of the brain and surrounding structures were obtained without intravenous contrast. Angiographic images of the head were obtained using MRA technique without contrast.  COMPARISON:  CTA head 12/27/2013  FINDINGS: MRI HEAD FINDINGS  There is a 6 mm focus of increased diffusion weighted signal intensity in the posterior left corona radiata without definite restricted diffusion on the ADC map. There are also a few punctate foci of mildly increased diffusion-weighted signal in the high right frontal lobe involving white matter greater than cortex, also without restricted diffusion identified on the ADC map although with evaluation limited by their small size.  Scattered foci of chronic microhemorrhage are present throughout both cerebral hemispheres. Small, chronic infarcts are present in both cerebellar hemispheres. Chronic lacunar infarcts are present in the left greater than right basal ganglia and right frontal periventricular  white matter. Small to moderate-sized chronic infarct is present in the left parietal lobe with associated chronic blood product, and there is a small, chronic right occipital cortical infarct. There is mild generalized cerebral atrophy. Patchy T2 hyperintensities in the subcortical and deep cerebral white matter elsewhere are nonspecific but compatible with mild to moderate chronic small vessel ischemic disease, advanced for age.  Orbits are unremarkable. Paranasal sinuses and mastoid air cells are clear. Distal left vertebral artery flow void is not well seen. Major intracranial vascular flow voids are otherwise preserved.  MRA HEAD FINDINGS  The visualized distal right vertebral artery is patent and dominant, supplying the basilar. Only minimal, intermittent flow related enhancement is present in the expected region of the intracranial left vertebral artery, which was shown to be very small in caliber although patent on the recent CTA. Basilar artery is patent and tortuous without stenosis. SCA origins are patent. Right P1 segment is patent without stenosis. There is a mild to moderate focal proximal right P2 stenosis. There is a prominent, patent left posterior communicating artery. No significant left PCA stenosis is seen.  Internal carotid arteries are patent from skullbase to carotid termini without stenosis. Cavernous and supraclinoid segments are again noted to be mildly ectatic, right more so than left. MCAs are unremarkable aside from mild branch vessel irregularity. There are large right A1 and hypoplastic left A1 segments. There are moderate to severe tandem stenoses involving the A2/A3 segments bilaterally. No intracranial aneurysm is identified.  IMPRESSION: 1. No significant sized acute or subacute infarct. Subcentimeter foci of abnormal diffusion-weighted signal in the left corona radiata and high right frontal lobe may reflect subacute  embolic/small vessel infarcts. 2. Multiple chronic infarcts and  small vessel ischemic disease as above. 3. No significant flow related enhancement identified in the visualized distal left vertebral artery. This session to be very small but patent on the recent CTA. 4. Mild-to-moderate proximal right P2 stenosis. 5. Ectatic distal internal carotid arteries. 6. Moderate to severe bilateral A2/A3 stenoses.   Electronically Signed   By: Logan Bores   On: 12/28/2013 13:32   Ct Abdomen Pelvis W Contrast  12/28/2013   CLINICAL DATA:  Abdominal tenderness. Abdominal distention. Abnormal liver function tests. History of hypertension and CABG surgery. Abdominal mass.  EXAM: CT ABDOMEN AND PELVIS WITH CONTRAST  TECHNIQUE: Multidetector CT imaging of the abdomen and pelvis was performed using the standard protocol following bolus administration of intravenous contrast.  CONTRAST:  173m OMNIPAQUE IOHEXOL 300 MG/ML  SOLN  COMPARISON:  None.  FINDINGS: Heart is moderately enlarged. There is a right aortic arch. A stent extends through the thoracic aorta. Calcified granuloma is noted in the right lower lobe. No other lung base nodules.  Liver is enlarged containing a numerous heterogeneous lesions. A conglomeration of lesions in the right lobe measures approximate 14 cm x 8.1 cm transversely. A discrete mass in the left lobe, crossing the ligamentum flavum, measures 5.6 cm x 5.3 cm transversely. Conglomeration of lesions in the lateral segment of the left lobe measures 8.2 cm x 5.2 cm. There are numerous additional lesions. Lateral segment of the left lobe of the liver is larger the right lobe. Liver measures 27 cm in greatest transverse dimension.  There is a 7 cm long length of thick-walled sigmoid colon with associated small mesenteric lymph nodes. Given the findings in the liver, this is highly suspicious for a colonic malignancy.  No other colonic masses. No obstruction. No bowel inflammatory change. Normal appendix is seen.  Spleen is normal in size. Gallbladder contains multiple  gallstones. There is no evidence acute cholecystitis. Is no bile duct dilation. Pancreas is unremarkable. No adrenal masses. Bilateral renal cortical thinning. There are several small low-density renal lesions most likely cysts. Kidneys otherwise unremarkable. No hydronephrosis. Ureters normal course and caliber. Bladder is unremarkable.  There is several prominent left retroperitoneal lymph nodes and aortocaval nodes. None are larger than 1 cm short axis. These may be neoplastic or reactive.  Small amount of ascites is noted collecting adjacent to the liver and in the posterior pelvic recess.  No osteoblastic or osteolytic lesions. Degenerative changes of the hips, right greater than left. Spine degenerative changes most evident at L3-L4.  IMPRESSION: 1. No acute finding. 2. Liver is enlarged with numerous ill-defined hypo attenuating and mixed attenuation lesions. There is a 7 cm length of sigmoid colon that is diffusely thickened, consistent with a primary colon malignancy. Metastatic disease is liver is suspected. Liver abnormalities could potentially reflect multifocal hepatic cellular carcinoma. 3. Small amount of ascites. No other evidence of metastatic disease. Nodes.   Electronically Signed   By: DLajean ManesM.D.   On: 12/28/2013 13:45   Mr MJodene NamHead/brain Wo Cm  12/28/2013   CLINICAL DATA:  Chronic dizziness for 2 months worsening in the past week. Two falls.  EXAM: MRI HEAD WITHOUT CONTRAST  MRA HEAD WITHOUT CONTRAST  TECHNIQUE: Multiplanar, multiecho pulse sequences of the brain and surrounding structures were obtained without intravenous contrast. Angiographic images of the head were obtained using MRA technique without contrast.  COMPARISON:  CTA head 12/27/2013  FINDINGS: MRI HEAD FINDINGS  There is a 6  mm focus of increased diffusion weighted signal intensity in the posterior left corona radiata without definite restricted diffusion on the ADC map. There are also a few punctate foci of mildly  increased diffusion-weighted signal in the high right frontal lobe involving white matter greater than cortex, also without restricted diffusion identified on the ADC map although with evaluation limited by their small size.  Scattered foci of chronic microhemorrhage are present throughout both cerebral hemispheres. Small, chronic infarcts are present in both cerebellar hemispheres. Chronic lacunar infarcts are present in the left greater than right basal ganglia and right frontal periventricular white matter. Small to moderate-sized chronic infarct is present in the left parietal lobe with associated chronic blood product, and there is a small, chronic right occipital cortical infarct. There is mild generalized cerebral atrophy. Patchy T2 hyperintensities in the subcortical and deep cerebral white matter elsewhere are nonspecific but compatible with mild to moderate chronic small vessel ischemic disease, advanced for age.  Orbits are unremarkable. Paranasal sinuses and mastoid air cells are clear. Distal left vertebral artery flow void is not well seen. Major intracranial vascular flow voids are otherwise preserved.  MRA HEAD FINDINGS  The visualized distal right vertebral artery is patent and dominant, supplying the basilar. Only minimal, intermittent flow related enhancement is present in the expected region of the intracranial left vertebral artery, which was shown to be very small in caliber although patent on the recent CTA. Basilar artery is patent and tortuous without stenosis. SCA origins are patent. Right P1 segment is patent without stenosis. There is a mild to moderate focal proximal right P2 stenosis. There is a prominent, patent left posterior communicating artery. No significant left PCA stenosis is seen.  Internal carotid arteries are patent from skullbase to carotid termini without stenosis. Cavernous and supraclinoid segments are again noted to be mildly ectatic, right more so than left. MCAs are  unremarkable aside from mild branch vessel irregularity. There are large right A1 and hypoplastic left A1 segments. There are moderate to severe tandem stenoses involving the A2/A3 segments bilaterally. No intracranial aneurysm is identified.  IMPRESSION: 1. No significant sized acute or subacute infarct. Subcentimeter foci of abnormal diffusion-weighted signal in the left corona radiata and high right frontal lobe may reflect subacute embolic/small vessel infarcts. 2. 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 3. No significant flow related enhancement identified in the visualized distal left vertebral artery. This session to be very small but patent on the recent CTA. 4. Mild-to-moderate proximal right P2 stenosis. 5. Ectatic distal internal carotid arteries. 6. Moderate to severe bilateral A2/A3 stenoses.   Electronically Signed   By: Logan Bores   On: 12/28/2013 13:32    Scheduled Meds: . amLODipine  5 mg Oral Daily  . aspirin  325 mg Oral Daily  . [START ON 12/30/2013] Influenza vac split quadrivalent PF  0.5 mL Intramuscular Tomorrow-1000  . metoprolol succinate  50 mg Oral Daily  . pantoprazole  40 mg Oral Daily  . peg 3350 powder  0.5 kit Oral Once   And  . [START ON 12/30/2013] peg 3350 powder  0.5 kit Oral Once   Continuous Infusions: . sodium chloride 100 mL/hr at 12/29/13 1501    Principal Problem:   AKI (acute kidney injury) Active Problems:   Lactic acidosis   Cerebral embolism with cerebral infarction   Abnormal LFTs   Leucocytosis   Dizziness and giddiness   Abdominal mass   Loss of weight   Acute kidney  injury   Hepatomegalia   HLD (hyperlipidemia)    Time spent: 30 minutes     Barton Dubois  [facility] Pager [phone]. If 7PM-7AM, please contact night-coverage at [email or web address], password [facility] 12/29/2013, 4:47 PM  LOS: 2 days

## 2013-12-29 NOTE — Consult Note (Signed)
NAMEMarland [name]  IVERY, NANNEY NO.:  [id number]  MEDICAL RECORD NO.:  [id number]  LOCATION:  [id number]                        FACILITY:  [facility]  PHYSICIAN:  Volanda Napoleon, M.D.  DATE OF BIRTH:  20-Aug-1955  DATE OF CONSULTATION:  12/28/2013 DATE OF DISCHARGE:                                CONSULTATION   REFERRING PHYSICIAN:  Julieta Bellini, MD  REASON FOR CONSULTATION:  Likely metastatic colon cancer.  HISTORY OF PRESENT ILLNESS:  [name] is a very nice 58 year old [place] gentleman.  He has been in [place] for about 35 years. He has been doing pretty well.  He does have significant coronary artery disease.  He has already had heart surgery.  This was several years ago. He has pretty significant cholesterol issues.  He had been having some dizziness when he was admitted.  He came in on the 19th.  When he was admitted, his hemoglobin was 10.9 with a hematocrit of 32.3.  MCV was 77, white cell count was 15, platelet count was 353.  His alkaline phosphatase was 330.  His BUN and creatinine were 24 and 1.56.  His potassium was 3.7.  He has never had a colonoscopy.  He says he has had no obvious melena or bright red blood per rectum.  He says he has had some blood on the toilet paper.  He underwent a CT of the abdomen and pelvis.  This, unfortunately, showed numerous hepatic metastasis.  He had hepatomegaly.  He had a sigmoid colonic lesion that appeared to be 7 cm long.  There are some slightly enlarged mesenteric lymph nodes.  There was no obvious lung nodules at the lung bases.  He has some prominent left retroperitoneal lymph nodes and aortocaval nodes.  He did have a CT angiogram of the head and neck.  This basically showed no obvious acute intracranial process.  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  He did not have any obvious ramus.  An MRI of the brain was done today.  This showed some chronic changes.  Again, no metastasis was noted.  He  had mild to moderate proximal right P2 stenosis.  Moderate to severe bilateral A2/A3 stenosis.  He has lost about 25-30 pounds over 2 months.  His appetite has been down.  He has had no cough.  He has had no fever.  He has had no sweats.  There has been no leg swelling or pain.  Currently, his performance status is ECOG 1.  PAST MEDICAL HISTORY:  Remarkable for, 1. Coronary artery disease with bypass graft. 2. Hypertension. 3. Hyperlipidemia.  ALLERGIES:  Penicillin.  ADMISSION MEDICATIONS: 1. Norvasc 5 mg p.o. daily. 2. Metoprolol XL 50 mg p.o. daily. 3. Zocor 20 mg p.o. daily. 4. Flomax 0.4 mg p.o. daily.  SOCIAL HISTORY:  Negative for tobacco use.  He does smoke marijuana.  He has rare alcohol use.  FAMILY HISTORY:  Remarkable for heart disease.  There is diabetes. There is no obvious cancer that he knows of.  PHYSICAL EXAMINATION:  GENERAL:  This is a thin [place] gentleman.  He is alert and oriented x3. VITAL SIGNS:  Temperature of 99.2,  pulse 87, blood pressure 159/96.  His weight is 159 pounds. HEAD AND NECK:  No ocular or oral lesions.  He has no scleral icterus. There is no thrush in the oral cavity.  Neck is supple with no obvious adenopathy. LUNGS:  Clear. CARDIAC:  Regular rate and rhythm with a 3/6 systolic ejection murmur. ABDOMEN:  Somewhat distended.  His liver is quite large.  His liver extends across the midline.  His liver extends below the right costal margin by several centimeters.  There is no obvious fluid.  EXTREMITIES: Some slight muscle atrophy in upper and lower extremities. SKIN:  No ecchymoses or petechia. NEUROLOGIC:  No focal neurological deficits.  IMPRESSION AND PLAN:  [name] is a 58 year old [place] gentleman.  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  We do not have a diagnosis yet, but this will be done early next week.  I think colonoscopy would be reasonable for him.  He has stage IV disease.  I talked to him about this.   He understands that this is treatable but not curable.  However, with treatment, we certainly can get 2 or 3 years on average.  Again, he needs to have a colonoscopy.  GI will do this.  When he has the colonoscopy, he will need to have his tumor sent off for KRAS and NRAS analysis.  This I think is very important.  I think that the cerebrovascular disease is not secondary to malignancy. I do not think he is hypercoagulable from his cancer.  He has a bad cholesterol disease.  He has stenosis of his arteries.  Again, I do not think that he is hypercoagulable from malignancy.  There may be a component but I think the major component is his underlying peripheral vascular and cerebrovascular disease as documented on his scans.  He is iron deficient.  I will order him some IV iron.  He needs to have a CT of the chest to see if he has any pulmonary metastasis.  We will see what his CEA level is.  He will need to have a Port-A-Cath placed.  This can be done next week.  I think that having on aspirin right now probably is going to be complicating things.  He really needs to be off aspirin for these procedures to be done.  I would put him on Lovenox for right now.  We will certainly follow him along.  I spoke to him for about an hour.  Again, he understands that he is treatable but not curable.  Once, we get the results back from biopsies, then we can plan for more definitive treatment plans.     Volanda Napoleon, M.D.     PRE/MEDQ  D:  12/28/2013  T:  12/29/2013  Job:  [id number]

## 2013-12-29 NOTE — Plan of Care (Signed)
Problem: Consults Goal: General Medical Patient Education See Patient Education Module for specific education.  Outcome: Progressing     

## 2013-12-29 NOTE — Evaluation (Signed)
Clinical/Bedside Swallow Evaluation Patient Details  Name: [name] MRN: [id number] Date of Birth: 16-Sep-1955  Today's Date: 12/29/2013 Time: 1206-1216 SLP Time Calculation (min) (ACUTE ONLY): 10 min  Past Medical History:  Past Medical History  Diagnosis Date  . Hypertension    Past Surgical History:  Past Surgical History  Procedure Laterality Date  . Coronary artery bypass graft     HPI:  58 y.o. male with Past medical history of hypertension, BPH admitted with complaints of dizziness and falls.  Per MD note pt. has lost 25+ pound weight over last 2 months. Found to have mild acute kidney injury CXR No evidence of active pulmonary disease. Linear atelectasis in the right lung base.  CT revealed No definite acute intracranial process. RIGHT posterior cerebral artery, LEFT middle cerebral artery territory of remote infarcts including LEFT basal ganglia. Additional small basal ganglia and cerebellar infarcts. Imaging concerning for metestatic colon cancer.   Assessment / Plan / Recommendation Clinical Impression  Pt's oropharyngeal swallow appears WFL with cup and straw sips of water. Pt is currently on a clear liquid diet in preparation for planned procedure, and therefore solid bolus trials were held. Recommend to continue with thin liquids. SLP to f/u briefly to assess solids as medically able.    Aspiration Risk  Mild    Diet Recommendation Thin liquid (on clear liquids per MD)   Liquid Administration via: Cup;Straw Medication Administration: Whole meds with liquid Supervision: Patient able to self feed Compensations: Slow rate;Small sips/bites Postural Changes and/or Swallow Maneuvers: Seated upright 90 degrees    Other  Recommendations Oral Care Recommendations: Oral care BID   Follow Up Recommendations  None    Frequency and Duration min 1 x/week  1 week   Pertinent Vitals/Pain n/a    SLP Swallow Goals     Swallow Study Prior Functional Status        General Date of Onset: 12/27/13 HPI: 58 y.o. male with Past medical history of hypertension, BPH admitted with complaints of dizziness and falls.  Per MD note pt. has lost 25+ pound weight over last 2 months. Found to have mild acute kidney injury CXR No evidence of active pulmonary disease. Linear atelectasis in the right lung base.  CT revealed No definite acute intracranial process. RIGHT posterior cerebral artery, LEFT middle cerebral artery territory of remote infarcts including LEFT basal ganglia. Additional small basal ganglia and cerebellar infarcts. Imaging concerning for metestatic colon cancer. Type of Study: Bedside swallow evaluation Previous Swallow Assessment: none in chart Diet Prior to this Study: Thin liquids;Other (Comment) (on clear liquids for planned procedure) Temperature Spikes Noted: Yes Respiratory Status: Room air History of Recent Intubation: No Behavior/Cognition: Alert;Cooperative;Pleasant mood Oral Cavity - Dentition: Dentures, top Self-Feeding Abilities: Able to feed self Patient Positioning: Upright in chair Baseline Vocal Quality: Clear Volitional Cough: Strong Volitional Swallow: Able to elicit    Oral/Motor/Sensory Function Overall Oral Motor/Sensory Function: Appears within functional limits for tasks assessed   Ice Chips Ice chips: Not tested   Thin Liquid Thin Liquid: Within functional limits Presentation: Cup;Self Fed;Straw    Nectar Thick Nectar Thick Liquid: Not tested   Honey Thick Honey Thick Liquid: Not tested   Puree Puree: Not tested   Solid   GO Functional Assessment Tool Used: skilled clinical judgment Functional Limitations: Swallowing Swallow Current Status (W9798): At least 1 percent but less than 20 percent impaired, limited or restricted Swallow Goal Status [phone]): At least 1 percent but less than 20 percent impaired, limited or  restricted  Solid: Not tested         Germain Osgood, M.A.  CCC-SLP [phone]  Germain Osgood 12/29/2013,12:25 PM

## 2013-12-30 ENCOUNTER — Encounter (HOSPITAL_COMMUNITY): Payer: Self-pay | Admitting: *Deleted

## 2013-12-30 ENCOUNTER — Encounter (HOSPITAL_COMMUNITY)
Admission: EM | Disposition: A | Discharge status: Home Health | Payer: Self-pay | Source: Home / Self Care | Attending: Internal Medicine

## 2013-12-30 DIAGNOSIS — K56609 Unspecified intestinal obstruction, unspecified as to partial versus complete obstruction: Secondary | ICD-10-CM | POA: Diagnosis present

## 2013-12-30 DIAGNOSIS — C189 Malignant neoplasm of colon, unspecified: Secondary | ICD-10-CM

## 2013-12-30 HISTORY — PX: FLEXIBLE SIGMOIDOSCOPY: SHX5431

## 2013-12-30 HISTORY — DX: Malignant neoplasm of colon, unspecified: C18.9

## 2013-12-30 LAB — OCCULT BLOOD X 1 CARD TO LAB, STOOL
Fecal Occult Bld: NEGATIVE
Fecal Occult Bld: POSITIVE — AB

## 2013-12-30 LAB — GLUCOSE, CAPILLARY
GLUCOSE-CAPILLARY: 90 mg/dL (ref 70–99)
GLUCOSE-CAPILLARY: 89 mg/dL (ref 70–99)
GLUCOSE-CAPILLARY: 87 mg/dL (ref 70–99)
GLUCOSE-CAPILLARY: 104 mg/dL — AB (ref 70–99)

## 2013-12-30 SURGERY — SIGMOIDOSCOPY, FLEXIBLE
Anesthesia: Moderate Sedation

## 2013-12-30 MED ORDER — MIDAZOLAM HCL 5 MG/ML IJ SOLN
INTRAMUSCULAR | Status: AC
  Filled 2013-12-30: qty 2

## 2013-12-30 MED ORDER — FENTANYL CITRATE 0.05 MG/ML IJ SOLN
INTRAMUSCULAR | Status: AC
  Filled 2013-12-30: qty 2

## 2013-12-30 MED ORDER — NICOTINE 7 MG/24HR TD PT24
7.0000 mg | MEDICATED_PATCH | Freq: Every day | TRANSDERMAL | Status: DC
  Administered 2013-12-30 – 2014-01-11 (×12): 7 mg via TRANSDERMAL
  Filled 2013-12-30 (×16): qty 1

## 2013-12-30 MED ORDER — FENTANYL CITRATE 0.05 MG/ML IJ SOLN
INTRAMUSCULAR | Status: DC | PRN
  Administered 2013-12-30 (×2): 25 ug via INTRAVENOUS

## 2013-12-30 MED ORDER — DIPHENHYDRAMINE HCL 50 MG/ML IJ SOLN
INTRAMUSCULAR | Status: AC
  Filled 2013-12-30: qty 1

## 2013-12-30 MED ORDER — MIDAZOLAM HCL 10 MG/2ML IJ SOLN
INTRAMUSCULAR | Status: DC | PRN
  Administered 2013-12-30 (×2): 2 mg via INTRAVENOUS

## 2013-12-30 NOTE — Consult Note (Signed)
[name] 1955/04/19  [id number].   Requesting MD: Dr. Barton Dubois Chief Complaint/Reason for Consult: sigmoid colon cancer, need port a cath HPI: This is a 59 yo [place] male who reports no past medical history, but must have some CAD or valvular disorder has he has had heart surgery (can't remember what he had done), MRI shows previous CVAs, and HTN.  He presented to [facility] several days ago with dizziness and headaches.  He was admitted for further work up.  He also had a 30 lbs weight loss in the last month with elevated LFTs.  A CT abdomen/pelvis was recommended.  After admission, the patient was found to have have had several CVAs, likely embolic in nature.  Time unknown.  He also was found to have sigmoid mass with hepatic involvement, likely metastatic disease.  GI was consulted and a colonoscopy was obtained.  This revealed an obstructing sigmoid lesion about 15cm from the anal verge.  Oncology has also been consulted.  They recommend placement of a PAC and now with the c-scope findings a possible diverting colostomy.  The patient states he is still passing stool, but its in liquid form.  He denies any blood in his stool.  Decreased appetite and abdominal distention.  He states all of this essentially happened within the last 2 days.  We have been asked to evaluate the patient.  ROS : Please see HPI, otherwise all other systems are negative  History reviewed. No pertinent family history.  Past Medical History  Diagnosis Date  . Hypertension   . Malignant neoplasm of colon 12/30/2013    Past Surgical History  Procedure Laterality Date  . Coronary artery bypass graft, possibly with valve repair or replacement      Social History:  reports that he has never smoked. He has never used smokeless tobacco. He reports that he drinks alcohol. He reports that he uses illicit drugs (Marijuana).  Allergies:  Allergies  Allergen Reactions  . Penicillins     childhood    Medications Prior  to Admission  Medication Sig Dispense Refill  . amLODipine (NORVASC) 5 MG tablet Take 5 mg by mouth daily.    . metoprolol succinate (TOPROL-XL) 50 MG 24 hr tablet Take 50 mg by mouth daily. Take with or immediately following a meal.    . simvastatin (ZOCOR) 20 MG tablet Take 20 mg by mouth daily.    . tamsulosin (FLOMAX) 0.4 MG CAPS capsule Take 0.4 mg by mouth daily after supper.      Blood pressure 143/94, pulse 81, temperature 97.8 F (36.6 C), temperature source Oral, resp. rate 18, height 5\' 10"  (1.778 m), weight 158 lb 11.7 oz (72 kg), SpO2 98 %. Physical Exam: General: pleasant, [place] male who is laying in bed in NAD HEENT: head is normocephalic, atraumatic.  Sclera are noninjected.  PERRL.  Ears and nose without any masses or lesions.  Mouth is pink and moist Heart: regular, rate, and rhythm.  Normal s1,s2. No obvious gallops, or rubs noted, but he has a significant murmur (? Valve surgery)  Palpable radial and pedal pulses bilaterally Lungs: CTAB, no wheezes, rhonchi, or rales noted.  Respiratory effort nonlabored Abd: soft, but distended, +BS, minimally tender, significant hepatomegaly.  Palpable liver masses noted in mid to left portion of abdomen, c/w what is seen on CT scan MS: all 4 extremities are symmetrical with no cyanosis, clubbing, or edema. Skin: warm and dry with no masses, lesions, or rashes Psych: A&Ox3 with an appropriate affect.  Results for orders placed or performed during the hospital encounter of 12/27/13 (from the past 48 hour(s))  Glucose, capillary     Status: None   Collection Time: 12/28/13  5:02 PM  Result Value Ref Range   Glucose-Capillary 86 70 - 99 mg/dL  Occult blood card to lab, stool RN will collect     Status: Abnormal   Collection Time: 12/28/13  6:35 PM  Result Value Ref Range   Fecal Occult Bld POSITIVE (A) NEGATIVE  CEA     Status: Abnormal   Collection Time: 12/28/13  8:40 PM  Result Value Ref Range   CEA 1213.2 (H) 0.0 - 5.0  ng/mL    Comment: (NOTE) Result repeated and verified. Result confirmed by automatic dilution. Performed at [facility]   Prealbumin     Status: Abnormal   Collection Time: 12/28/13  8:40 PM  Result Value Ref Range   Prealbumin 5.5 (L) 17.0 - 34.0 mg/dL    Comment: Performed at [facility]  Glucose, capillary     Status: Abnormal   Collection Time: 12/28/13  8:59 PM  Result Value Ref Range   Glucose-Capillary 111 (H) 70 - 99 mg/dL  Glucose, capillary     Status: None   Collection Time: 12/29/13  7:46 AM  Result Value Ref Range   Glucose-Capillary 92 70 - 99 mg/dL  Glucose, capillary     Status: None   Collection Time: 12/29/13 12:02 PM  Result Value Ref Range   Glucose-Capillary 81 70 - 99 mg/dL  Glucose, capillary     Status: None   Collection Time: 12/29/13  5:20 PM  Result Value Ref Range   Glucose-Capillary 86 70 - 99 mg/dL  Glucose, capillary     Status: None   Collection Time: 12/29/13 10:16 PM  Result Value Ref Range   Glucose-Capillary 91 70 - 99 mg/dL   Comment 1 Documented in Chart    Comment 2 Notify RN   Occult blood card to lab, stool     Status: None   Collection Time: 12/30/13  1:40 AM  Result Value Ref Range   Fecal Occult Bld NEGATIVE NEGATIVE  Occult blood card to lab, stool     Status: Abnormal   Collection Time: 12/30/13  6:10 AM  Result Value Ref Range   Fecal Occult Bld POSITIVE (A) NEGATIVE  Glucose, capillary     Status: None   Collection Time: 12/30/13  7:40 AM  Result Value Ref Range   Glucose-Capillary 90 70 - 99 mg/dL  Glucose, capillary     Status: None   Collection Time: 12/30/13 11:27 AM  Result Value Ref Range   Glucose-Capillary 89 70 - 99 mg/dL   No results found.     Assessment/Plan 1. Sigmoid colon mass, likely carcinoma with significant metastasis to liver 2. Hepatomegaly secondary to #1 3. CVAs 4. HTN 5. CAD  Plan: 1. The patient is still able to move some liquid stool through, but concerned about  his ability to become completely obstructed given his colonoscopy findings.  He would likely benefit from diverting colostomy; however, his liver is so big, it may be partially in the way to do this operation.  He also needs a PAC.  This will need to be discussed with Dr. Rosendo Gros who is the physician here this week.  He will need to review the CT scan imaging to make the best recommendations.  He will need a CT of the chest as well for full  oncologic work up.  We could not proceed with surgical intervention until at least Tuesday due to a full schedule already.  Would continue clear liquids conservatively given how tight his stricture is.  We will follow.  [name] E 12/30/2013, 3:40 PM Pager: [phone]

## 2013-12-30 NOTE — Plan of Care (Signed)
Problem: Phase I Progression Outcomes Goal: OOB as tolerated unless otherwise ordered Outcome: Progressing Goal: Hemodynamically stable Outcome: Completed/Met Date Met:  12/30/13  Problem: Phase II Progression Outcomes Goal: Vital signs remain stable Outcome: Progressing

## 2013-12-30 NOTE — Progress Notes (Addendum)
[facility] PROGRESS NOTE  [name] ESP:[id number] DOB: 1955/05/28 DOA: 12/27/2013 PCP: No primary care provider on file.  Assessment/Plan: 1-dizziness and lightheadedness: secondary to acute/subacute punctate cortical/subcorticals stroke -patient with risk factors including HTN, HLD, small vessel disease and previous strokes -also concerns of potential hypercoagulable state from malignancy -per neurology rec's treat with lovenox -was on ASA, but not recommended by oncology given need of biopsies and invasives procedures -no major deficit or issues appreciated -will use meclizine PRN dizziness   2-abd mass: most likely colon cancer with liver metastasis -colonoscopy to be done for specimen collection and pathology (pending) -oncology on board helping with staging -no major complications -will arrange port a cath placement   3-HTN: stable overall. -continue current medications  4-HLD: not at goal -LDL elevated; statins on hold due to transaminatis and metastatic liver disease -advise to follow low fat diet  5-depression: due to newly diagnose malignancy -will continue celexa  Code Status: Full Family Communication: no family at bedside  Disposition Plan: to be determine    Consultants:  GI  Neurology  Oncology   Procedures:  See below for x-ray reports  Colonoscopy (planned for 11/21)  Antibiotics:  None   HPI/Subjective: Patient is a little somnolent after colonoscopy; no CP, no SOB, no nausea or vomiting. Afebrile. And in NAD  Objective: Filed Vitals:   12/30/13 1325  BP: 143/94  Pulse: 81  Temp: 97.8 F (36.6 C)  Resp: 18    Intake/Output Summary (Last 24 hours) at 12/30/13 1455 Last data filed at 12/30/13 1427  Gross per 24 hour  Intake 3288.33 ml  Output    700 ml  Net 2588.33 ml   Filed Weights   12/27/13 2125 12/28/13 0302  Weight: 72.576 kg (160 lb) 72 kg (158 lb 11.7 oz)    Exam:   General:  AAOX3, afebrile, no CP or  SOB; patient denies nausea or vomiting. Slightly somnolent after colonoscopy.  Cardiovascular: positive SEM, no rubs or gallops  Respiratory: good air movement, no wheezing, no crackles  Abdomen: distended, with palpable mass mid/left quadrant; also hepatomegaly appreciated on exam. Positive BS  Musculoskeletal: no edema or cyanosis   Data Reviewed: Basic Metabolic Panel:  Recent Labs Lab 12/27/13 2127 12/28/13 0334  NA 135* 137  K 3.7 3.2*  CL 93* 97  CO2 21 23  GLUCOSE 104* 93  BUN 24* 23  CREATININE 1.56* 1.28  CALCIUM 9.0 8.5   Liver Function Tests:  Recent Labs Lab 12/27/13 2127 12/28/13 0334  AST 73* 69*  ALT 21 19  ALKPHOS 330* 279*  BILITOT 0.6 0.5  PROT 7.9 6.9  ALBUMIN 2.7* 2.4*   CBC:  Recent Labs Lab 12/27/13 2127 12/28/13 0334  WBC 15.1* 13.5*  NEUTROABS 12.3* 9.5*  HGB 10.9* 9.8*  HCT 32.3* 29.3*  MCV 77.1* 79.6  PLT 353 289   CBG:  Recent Labs Lab 12/29/13 1202 12/29/13 1720 12/29/13 2216 12/30/13 0740 12/30/13 1127  GLUCAP 81 86 91 90 89    Recent Results (from the past 240 hour(s))  Culture, blood (routine x 2)     Status: None (Preliminary result)   Collection Time: 12/28/13  3:34 AM  Result Value Ref Range Status   Specimen Description BLOOD RIGHT ARM  Final   Special Requests BOTTLES DRAWN AEROBIC AND ANAEROBIC 10 CC  Final   Culture  Setup Time   Final    12/28/2013 09:26 Performed at [facility]   Final  BLOOD CULTURE RECEIVED NO GROWTH TO DATE CULTURE WILL BE HELD FOR 5 DAYS BEFORE ISSUING A FINAL NEGATIVE REPORT Performed at [facility]    Report Status PENDING  Incomplete  Culture, blood (routine x 2)     Status: None (Preliminary result)   Collection Time: 12/28/13  3:46 AM  Result Value Ref Range Status   Specimen Description BLOOD LEFT FOREARM  Final   Special Requests BOTTLES DRAWN AEROBIC AND ANAEROBIC 10 CC  Final   Culture  Setup Time   Final    12/28/2013  09:26 Performed at [facility]    Culture   Final           BLOOD CULTURE RECEIVED NO GROWTH TO DATE CULTURE WILL BE HELD FOR 5 DAYS BEFORE ISSUING A FINAL NEGATIVE REPORT Performed at [facility]    Report Status PENDING  Incomplete     Studies: No results found.  Scheduled Meds: . citalopram  10 mg Oral Daily  . enoxaparin (LOVENOX) injection  40 mg Subcutaneous Q24H  . isosorbide-hydrALAZINE  1 tablet Oral BID  . metoprolol succinate  50 mg Oral Daily  . pantoprazole  40 mg Oral Daily   Continuous Infusions: . sodium chloride 500 mL (12/30/13 0847)    Principal Problem:   AKI (acute kidney injury) Active Problems:   Lactic acidosis   Cerebral embolism with cerebral infarction   Abnormal LFTs   Leucocytosis   Dizziness and giddiness   Abdominal mass   Loss of weight   Acute kidney injury   Hepatomegalia   HLD (hyperlipidemia)   Malignant neoplasm of colon   Intestinal obstruction    Time spent: 30 minutes     Barton Dubois  [facility] Pager [phone]. If 7PM-7AM, please contact night-coverage at [email or web address], password [facility] 12/30/2013, 2:55 PM  LOS: 3 days

## 2013-12-30 NOTE — Op Note (Signed)
[facility] [place], [address]   COLONOSCOPY PROCEDURE REPORT  PATIENT: [name], Cody  MR#: [id number] BIRTHDATE: 06/26/1955 , 70  yrs. old GENDER: male ENDOSCOPIST: Lafayette Dragon, MD REFERRED BY:Dr Barton Dubois PROCEDURE DATE:  12/30/2013 PROCEDURE:   colonoscopy turned flexible sigmoidoscopy with biopsy First Screening Colonoscopy - Avg.  risk and is 50 yrs.  old or older - No.  Prior Negative Screening - Now for repeat screening. N/A  History of Adenoma - Now for follow-up colonoscopy & has been > or = to 3 yrs.  N/A  Polyps Removed Today? No. ASA CLASS:   Class III INDICATIONS:large obstructing sigmoid mass seen on CT scan of the abdomen.  Patient has had weight loss.  CEA level 1213.  Liver lesions.Marland Kitchen MEDICATIONS: Fentanyl 50 mcg IV and Versed 4 mg IV  DESCRIPTION OF PROCEDURE:   After the risks benefits and alternatives of the procedure were thoroughly explained, informed consent was obtained.  The digital rectal exam revealed no abnormalities of the rectum.   The Pentax Ped Colon [id number] endoscope was introduced through the anus and advanced to the sigmoid colon. No adverse events experienced.   The quality of the prep was good, using Fleets Enemas good, using MoviPrep  The instrument was then slowly withdrawn as the colon was fully examined.   Rectum: rectal ampulla and rectum appeared normal mucosa appeared normal up to 15 cm  Sigmoid colon  there was an obstruction at the level of 15 cm from the rectum the lumen was closed and I was unable to advanced pediatric scope through and narrow irregular appearing lumen which was infiltrated with a circumferential mass. We have switched to a pediatric upper endoscope with a smaller 6 mm  diameter but still were unable to advance through the obstructing tumor. Multiple biopsies were obtained from the distal margin o but because of the friability and  I wasn't sure that multiple  biopsy specimen were sufficient     .retroflexion of the scope in the rectum was normal The scope was withdrawn and the procedure completed. COMPLICATIONS: There were no immediate complications.  ENDOSCOPIC IMPRESSION:  obstructing apple core mass at 15 cm from the rectum. Only limited visualization due to complete obstruction. Lumen is less than 6 mm in diameter. Multiple biopsies were obtained from the distal margin of the tumor which was inadequately seen due to spasm and tortuosity unable to visualize colon proximal to the obstructing lesion which is located in sigmoid colon at 15 cm   RECOMMENDATIONS:  Continue clear liquid Patient will require surgical resection either by diverting colostomy. Given his liver metastases  palliative diverting colostomy may be appropriate Surgical/oncology opinion pending  eSigned:  Lafayette Dragon, MD 12/30/2013 10:02 AM   cc:   PATIENT NAME:  Cody, [name] MR#: [id number]

## 2013-12-30 NOTE — Plan of Care (Signed)
Problem: Acute Treatment Outcomes Goal: BP within ordered parameters Outcome: Progressing Goal: Hemodynamically stable Outcome: Progressing  Problem: Progression Outcomes Goal: Progressive activity as tolerated Outcome: Completed/Met Date Met:  12/30/13

## 2013-12-30 NOTE — Progress Notes (Signed)
When RN went to assessed pt, RN smelled a cigar. RN asked pt if he smoked earlier, pt did not deny and admitted that he was tempted to do so. RN explained to pt that smoking is not allowed in this facility, pt apologized. Encouraged the pt to try nicotine patch, pt willingly accepted the suggestion. Notified MD on cover and ordered. Nicotine patched applied to L deltoid area. We will continue to monitor.

## 2013-12-31 ENCOUNTER — Inpatient Hospital Stay (HOSPITAL_COMMUNITY): Payer: Medicaid Other

## 2013-12-31 ENCOUNTER — Encounter (HOSPITAL_COMMUNITY): Payer: Self-pay | Admitting: Internal Medicine

## 2013-12-31 DIAGNOSIS — I10 Essential (primary) hypertension: Secondary | ICD-10-CM

## 2013-12-31 DIAGNOSIS — R770 Abnormality of albumin: Secondary | ICD-10-CM

## 2013-12-31 DIAGNOSIS — R42 Dizziness and giddiness: Secondary | ICD-10-CM

## 2013-12-31 DIAGNOSIS — I2510 Atherosclerotic heart disease of native coronary artery without angina pectoris: Secondary | ICD-10-CM

## 2013-12-31 DIAGNOSIS — N179 Acute kidney failure, unspecified: Secondary | ICD-10-CM

## 2013-12-31 DIAGNOSIS — E789 Disorder of lipoprotein metabolism, unspecified: Secondary | ICD-10-CM

## 2013-12-31 DIAGNOSIS — R591 Generalized enlarged lymph nodes: Secondary | ICD-10-CM

## 2013-12-31 DIAGNOSIS — C787 Secondary malignant neoplasm of liver and intrahepatic bile duct: Secondary | ICD-10-CM

## 2013-12-31 DIAGNOSIS — R634 Abnormal weight loss: Secondary | ICD-10-CM

## 2013-12-31 DIAGNOSIS — E43 Unspecified severe protein-calorie malnutrition: Secondary | ICD-10-CM

## 2013-12-31 DIAGNOSIS — Z7982 Long term (current) use of aspirin: Secondary | ICD-10-CM

## 2013-12-31 DIAGNOSIS — C189 Malignant neoplasm of colon, unspecified: Secondary | ICD-10-CM

## 2013-12-31 LAB — GLUCOSE, CAPILLARY
GLUCOSE-CAPILLARY: 106 mg/dL — AB (ref 70–99)
Glucose-Capillary: 94 mg/dL (ref 70–99)
Glucose-Capillary: 81 mg/dL (ref 70–99)
Glucose-Capillary: 107 mg/dL — ABNORMAL HIGH (ref 70–99)

## 2013-12-31 MED ORDER — ENSURE COMPLETE PO LIQD
237.0000 mL | Freq: Three times a day (TID) | ORAL | Status: DC
  Administered 2013-12-31 – 2014-01-11 (×26): 237 mL via ORAL

## 2013-12-31 MED ORDER — TRAZODONE HCL 50 MG PO TABS
50.0000 mg | ORAL_TABLET | Freq: Every evening | ORAL | Status: DC | PRN
  Administered 2013-12-31 – 2014-01-13 (×11): 50 mg via ORAL
  Filled 2013-12-31 (×12): qty 1

## 2013-12-31 MED ORDER — SODIUM CHLORIDE 0.9 % IV SOLN
INTRAVENOUS | Status: DC
Start: 2013-12-31 — End: 2014-01-01

## 2013-12-31 MED ORDER — IOHEXOL 300 MG/ML  SOLN
75.0000 mL | Freq: Once | INTRAMUSCULAR | Status: AC | PRN
  Administered 2013-12-31: 75 mL via INTRAVENOUS

## 2013-12-31 MED ORDER — FLEET ENEMA 7-19 GM/118ML RE ENEM
2.0000 | ENEMA | Freq: Once | RECTAL | Status: AC
  Administered 2014-01-01: 2 via RECTAL
  Filled 2013-12-31: qty 2

## 2013-12-31 MED ORDER — ENOXAPARIN SODIUM 40 MG/0.4ML ~~LOC~~ SOLN
40.0000 mg | SUBCUTANEOUS | Status: DC
  Administered 2013-12-31 – 2014-01-05 (×5): 40 mg via SUBCUTANEOUS
  Filled 2013-12-31 (×7): qty 0.4

## 2013-12-31 MED ORDER — SODIUM CHLORIDE 0.9 % IV SOLN
INTRAVENOUS | Status: DC
  Administered 2013-12-31: 17:00:00 via INTRAVENOUS

## 2013-12-31 MED ORDER — ENSURE COMPLETE PO LIQD
237.0000 mL | Freq: Two times a day (BID) | ORAL | Status: DC
  Administered 2013-12-31: 237 mL via ORAL

## 2013-12-31 NOTE — Plan of Care (Signed)
Problem: Phase I Progression Outcomes Goal: OOB as tolerated unless otherwise ordered Outcome: Progressing  Problem: Phase II Progression Outcomes Goal: Vital signs remain stable Outcome: Completed/Met Date Met:  12/31/13  Problem: Phase III Progression Outcomes Goal: Pain controlled on oral analgesia Outcome: Completed/Met Date Met:  12/31/13 Goal: Voiding independently Outcome: Completed/Met Date Met:  12/31/13 Goal: Foley discontinued Outcome: Not Applicable Date Met:  59/10/28

## 2013-12-31 NOTE — Progress Notes (Signed)
Speech Language Pathology Treatment:    Patient Details Name: [name] MRN: [id number] DOB: October 03, 1955 Today's Date: 12/31/2013 Time: 2244-9753 SLP Time Calculation (min) (ACUTE ONLY): 8 min  Assessment / Plan / Recommendation Clinical Impression  Pt demonstrates adequate tolerance of thin liquids and denies any difficulty swallowing. Pt still not able to consume solids due to procedure tomorrow, but based on history and findings, would expect pt tolerate regular textures when deemed medically ready by MD. No SLP f/u needed, will sign off.    HPI HPI: 58 y.o. male with Past medical history of hypertension, BPH admitted with complaints of dizziness and falls.  Per MD note pt. has lost 25+ pound weight over last 2 months. Found to have mild acute kidney injury CXR No evidence of active pulmonary disease. Linear atelectasis in the right lung base.  CT revealed No definite acute intracranial process. RIGHT posterior cerebral artery, LEFT middle cerebral artery territory of remote infarcts including LEFT basal ganglia. Additional small basal ganglia and cerebellar infarcts. Imaging concerning for metestatic colon cancer.   Pertinent Vitals Pain Assessment: No/denies pain  SLP Plan  All goals met    Recommendations Diet recommendations: Regular;Thin liquid Liquids provided via: Cup;Straw Medication Administration: Whole meds with liquid Supervision: Patient able to self feed Postural Changes and/or Swallow Maneuvers: Seated upright 90 degrees              Plan: All goals met    GO    Herbie Baltimore, MA CCC-SLP [phone]  Lynann Beaver 12/31/2013, 2:30 PM

## 2013-12-31 NOTE — Progress Notes (Signed)
PT Cancellation Note  Patient Details Name: [name] MRN: [id number] DOB: [birth year]   Cancelled Treatment:    Reason Eval/Treat Not Completed: Fatigue/lethargy limiting ability to participate.  Patient declining PT today due to fatigue and feeling poorly.  Also verified that he will be discharging to a house with no steps.  Will adjust patient's goals.  Will return at later date for continued PT.   Despina Pole 12/31/2013, 4:59 PM Carita Pian. Sanjuana Kava, [place] Pager [phone]

## 2013-12-31 NOTE — Progress Notes (Addendum)
[facility] PROGRESS NOTE  [name] IHK:[id number] DOB: 1956/02/04 DOA: 12/27/2013 PCP: No primary care provider on file.  Interim summary 58 y/o male with PMH hx significant for HTN, HLD, hx of CVA, aortic stenosis and repair of aortic aneurysm; presented with complaints of dizziness. Patient also endorses unintentional weight loss (>30 pounds) and some abd discomfort. A CT abdomen/pelvis was done and demonstrated a sigmoid mass with high suspicious for colon cancer with liver metastasis. from dizziness work up, the patient was found to have had several CVAs, likely embolic in nature (potentially related with hypercoagulable state). GI was consulted and a colonoscopy was obtained for tissue sample and reveled almost complete obstruction of colon. Oncology has been contacted to help with staging and they are anticipating chemotherapy treatment. Surgery is on board for diverting colostomy and placement of Port A Cath. Patient doesn't not present any neurologic or motor deficit from new stroke.   Assessment/Plan: 1-dizziness and lightheadedness: secondary to acute/subacute punctate cortical/subcorticals stroke -patient with risk factors including HTN, HLD, small vessel disease and previous strokes. -also concerns of potential hypercoagulable state from malignancy; I think he has both; but given whole picture will favor full dose ASA and risk factor modifications. -per neurology rec's treat with lovenox for now and switch to full dose ASA when tolerating PO's -was on ASA, but not recommended by oncology given need of biopsies and invasives procedures coming. -no major deficit or neurologic issues appreciated -will continue using meclizine PRN for dizziness   2-abd mass/almost complete colon obstruction: most likely colon cancer with liver and potentially lungs metastasis -colonoscopy done for specimen collection and pathology (pending) -oncology on board helping with staging; will need port  A cath and chemotherapy -CT chest suggesting metastatic nodularity in his lungs -will arrange port a cath placement and has discussed with surgery for diverting colostomy   3-HTN: stable overall. -continue current medications  4-HLD: not at goal -LDL elevated; statins on hold due to transaminatis and metastatic liver disease -advise to follow low fat diet  5-depression and insomnia: due to newly diagnose malignancy -will continue celexa  6-severe protein calorie malnutrition: continue feeding supplementation -follow dietitian rec's  Code Status: Full Family Communication: no family at bedside  Disposition Plan: to be determine    Consultants:  GI  Neurology  Oncology   General surgery   Procedures:  See below for x-ray reports  Colonoscopy (done on 11/22; demonstrated almost complete obstruction of his colon; tissue sample taken)  Antibiotics:  None   HPI/Subjective: Patient is in NAD; denies CP, SOB, nausea and vomiting. Afebrile. Mood is depressed and is having trouble with insomnia.  Objective: Filed Vitals:   12/31/13 0949  BP: 142/103  Pulse: 73  Temp: 98.1 F (36.7 C)  Resp: 16    Intake/Output Summary (Last 24 hours) at 12/31/13 1036 Last data filed at 12/31/13 0951  Gross per 24 hour  Intake 1776.67 ml  Output   1700 ml  Net  76.67 ml   Filed Weights   12/27/13 2125 12/28/13 0302  Weight: 72.576 kg (160 lb) 72 kg (158 lb 11.7 oz)    Exam:   General:  AAOX3, afebrile, no CP or SOB; patient denies nausea or vomiting. He is slightly depressed and have trouble with insomnia.  Cardiovascular: positive SEM, no rubs or gallops  Respiratory: good air movement, no wheezing, no crackles  Abdomen: distended, with palpable mass mid/left quadrant; also hepatomegaly appreciated on exam. Positive BS  Musculoskeletal: no edema or cyanosis  Data Reviewed: Basic Metabolic Panel:  Recent Labs Lab 12/27/13 2127 12/28/13 0334  NA 135* 137  K  3.7 3.2*  CL 93* 97  CO2 21 23  GLUCOSE 104* 93  BUN 24* 23  CREATININE 1.56* 1.28  CALCIUM 9.0 8.5   Liver Function Tests:  Recent Labs Lab 12/27/13 2127 12/28/13 0334  AST 73* 69*  ALT 21 19  ALKPHOS 330* 279*  BILITOT 0.6 0.5  PROT 7.9 6.9  ALBUMIN 2.7* 2.4*   CBC:  Recent Labs Lab 12/27/13 2127 12/28/13 0334  WBC 15.1* 13.5*  NEUTROABS 12.3* 9.5*  HGB 10.9* 9.8*  HCT 32.3* 29.3*  MCV 77.1* 79.6  PLT 353 289   CBG:  Recent Labs Lab 12/30/13 0740 12/30/13 1127 12/30/13 1700 12/30/13 2222 12/31/13 0756  GLUCAP 90 89 87 104* 94    Recent Results (from the past 240 hour(s))  Culture, blood (routine x 2)     Status: None (Preliminary result)   Collection Time: 12/28/13  3:34 AM  Result Value Ref Range Status   Specimen Description BLOOD RIGHT ARM  Final   Special Requests BOTTLES DRAWN AEROBIC AND ANAEROBIC 10 CC  Final   Culture  Setup Time   Final    12/28/2013 09:26 Performed at [facility]    Culture   Final           BLOOD CULTURE RECEIVED NO GROWTH TO DATE CULTURE WILL BE HELD FOR 5 DAYS BEFORE ISSUING A FINAL NEGATIVE REPORT Performed at [facility]    Report Status PENDING  Incomplete  Culture, blood (routine x 2)     Status: None (Preliminary result)   Collection Time: 12/28/13  3:46 AM  Result Value Ref Range Status   Specimen Description BLOOD LEFT FOREARM  Final   Special Requests BOTTLES DRAWN AEROBIC AND ANAEROBIC 10 CC  Final   Culture  Setup Time   Final    12/28/2013 09:26 Performed at [facility]    Culture   Final           BLOOD CULTURE RECEIVED NO GROWTH TO DATE CULTURE WILL BE HELD FOR 5 DAYS BEFORE ISSUING A FINAL NEGATIVE REPORT Performed at [facility]    Report Status PENDING  Incomplete     Studies: No results found.  Scheduled Meds: . citalopram  10 mg Oral Daily  . enoxaparin (LOVENOX) injection  40 mg Subcutaneous Q24H  . feeding supplement (ENSURE COMPLETE)  237 mL Oral  TID BM  . isosorbide-hydrALAZINE  1 tablet Oral BID  . metoprolol succinate  50 mg Oral Daily  . nicotine  7 mg Transdermal Daily  . pantoprazole  40 mg Oral Daily   Continuous Infusions: . sodium chloride 100 mL/hr at 12/31/13 9381    Principal Problem:   AKI (acute kidney injury) Active Problems:   Lactic acidosis   Cerebral embolism with cerebral infarction   Abnormal LFTs   Leucocytosis   Dizziness and giddiness   Abdominal mass   Loss of weight   Acute kidney injury   Hepatomegalia   HLD (hyperlipidemia)   Malignant neoplasm of colon   Intestinal obstruction    Time spent: 30 minutes     Barton Dubois  [facility] Pager [phone]. If 7PM-7AM, please contact night-coverage at [email or web address], password [facility] 12/31/2013, 10:36 AM  LOS: 4 days

## 2013-12-31 NOTE — Plan of Care (Signed)
Problem: Acute Rehab PT Goals(only PT should resolve) Goal: Pt Will Go Up/Down Stairs Outcome: Not Applicable Date Met:  73/08/56 Patient now will discharge to house with level entry.

## 2013-12-31 NOTE — Progress Notes (Signed)
I have examined the patient and reviewed the x-rays.  He has an obstructing sigmoid cancer with liver mets.  I think it is worthwhile attempting colonic stent placement.  If successful we may be able to avoid a colostomy.  Risks discussed with pt including bleeding and perforation.  Pt is agreeable.  Plan sigmoidoscopy in am.

## 2013-12-31 NOTE — Plan of Care (Signed)
Problem: Phase I Progression Outcomes Goal: OOB as tolerated unless otherwise ordered Outcome: Progressing     

## 2013-12-31 NOTE — Progress Notes (Signed)
NUTRITION FOLLOW UP  Pt meets criteria for severe MALNUTRITION in the context of chronic illness as evidenced by 12.3% wt loss in <6 months and reported intake <75% of estimated needs for >1 month.  Intervention:   -Ensure Complete po BID, each supplement provides 350 kcal and 13 grams of protein -RD to follow for diet advancement  Nutrition Dx:   Inadequate oral intake related to poor appetite as evidenced by wt loss.   Goal:   Pt to meet >/= 90% of their estimated nutrition needs   Monitor:   Weight trend, po intake, acceptance of supplements, labs  Assessment:   58 y.o. male with Past medical history of hypertension, BPH. The patient is presenting with complaints of dizziness.  Pt has been advanced to a full liquid diet. Intake fair; 50% meal completion. Will order Ensure now that diet is advanced to promote nutritional adequacy.  Chart review indicates that pt is depressed about pending diagnosis. Work up suggests metastatic colon cancer. Pt is scheduled for sigmoidoscopy 11/24 AM.  Labs reviewed. K: 3.2, CBGS: 81-104.   Height: Ht Readings from Last 1 Encounters:  12/28/13 5\' 10"  (1.778 m)    Weight Status:   Wt Readings from Last 1 Encounters:  12/28/13 158 lb 11.7 oz (72 kg)    Re-estimated needs:  Kcal: 1800-2000 Protein: 95-110 g Fluid: 1.8-2.0 L/day  Skin: Intact  Diet Order: Diet full liquid Diet NPO time specified   Intake/Output Summary (Last 24 hours) at 12/31/13 1513 Last data filed at 12/31/13 0951  Gross per 24 hour  Intake 1536.67 ml  Output   1200 ml  Net 336.67 ml    Last BM: 12/30/13   Labs:   Recent Labs Lab 12/27/13 2127 12/28/13 0334  NA 135* 137  K 3.7 3.2*  CL 93* 97  CO2 21 23  BUN 24* 23  CREATININE 1.56* 1.28  CALCIUM 9.0 8.5  GLUCOSE 104* 93    CBG (last 3)   Recent Labs  12/30/13 2222 12/31/13 0756 12/31/13 1156  GLUCAP 104* 94 81    Scheduled Meds: . citalopram  10 mg Oral Daily  . enoxaparin  (LOVENOX) injection  40 mg Subcutaneous Q24H  . feeding supplement (ENSURE COMPLETE)  237 mL Oral TID BM  . isosorbide-hydrALAZINE  1 tablet Oral BID  . metoprolol succinate  50 mg Oral Daily  . nicotine  7 mg Transdermal Daily  . pantoprazole  40 mg Oral Daily  . [START ON 01/01/2014] sodium phosphate  2 enema Rectal Once    Continuous Infusions: . sodium chloride 100 mL/hr at 12/31/13 0950    Dilana Mcphie A. Jimmye Norman, RD, LDN Pager: [phone] After hours Pager: [phone]

## 2013-12-31 NOTE — Progress Notes (Signed)
Occupational Therapy Evaluation Patient Details Name: [name] MRN: [id number] DOB: [birth year] Today's Date: 12/31/2013    History of Present Illness  Joran Kallal is a 58 y.o. male with Past medical history of hypertension, BPH.  Presented with dizziness recent falls x 2, and 25+# weight loss in 2 months.   Clinical Impression   PTA, pt independent with mobility and ADL. Pt moving well at this time. Will plan to follow up after surgery to further assess home DME needs. Pt plans to D/C to wife's apt and will have intermittent S. Pt educated on availability of pastoral services for counseling if needed. Will follow up as needed.     Follow Up Recommendations  No OT follow up;Supervision - Intermittent    Equipment Recommendations  Tub/shower seat    Recommendations for Other Services       Precautions / Restrictions Precautions Precautions: Fall      Mobility Bed Mobility Overal bed mobility: Modified Independent                Transfers Overall transfer level: Modified independent                    Balance Overall balance assessment: No apparent balance deficits (not formally assessed)                                          ADL Overall ADL's : Needs assistance/impaired                                     Functional mobility during ADLs: Modified independent General ADL Comments: overall set up for ADL  May need shower seat after surgery.     Vision                     Perception     Praxis      Pertinent Vitals/Pain Pain Assessment: No/denies pain     Hand Dominance Right   Extremity/Trunk Assessment Upper Extremity Assessment Upper Extremity Assessment: Overall WFL for tasks assessed   Lower Extremity Assessment Lower Extremity Assessment: Defer to PT evaluation   Cervical / Trunk Assessment Cervical / Trunk Assessment: Normal   Communication Communication Communication: No  difficulties   Cognition Arousal/Alertness: Awake/alert Behavior During Therapy: Flat affect Overall Cognitive Status: Within Functional Limits for tasks assessed                     General Comments       Exercises Exercises: Other exercises Other Exercises Other Exercises: encouraged OOB throughout the day and to ambulate with staff   Shoulder Instructions      Home Living Family/patient expects to be discharged to:: Private residence Living Arrangements: Other relatives;Non-relatives/Friends Available Help at Discharge: Available 24 hours/day (ex-wife works on weekends) Type of Home: Apartment Home Access: Level entry     Home Layout: One level     Bathroom Shower/Tub: Tub/shower unit Shower/tub characteristics: Architectural technologist: Standard Bathroom Accessibility: Yes How Accessible: Accessible via walker Home Equipment: None   Additional Comments: Plans to stay with wife who has level entry apt      Prior Functioning/Environment Level of Independence: Independent             OT Diagnosis: Generalized weakness   OT  Problem List: Decreased activity tolerance;Decreased knowledge of use of DME or AE   OT Treatment/Interventions: Self-care/ADL training;DME and/or AE instruction;Patient/family education    OT Goals(Current goals can be found in the care plan section) Acute Rehab OT Goals Patient Stated Goal: to be glad I'm alive OT Goal Formulation: With patient Time For Goal Achievement: 01/14/14 Potential to Achieve Goals: Good  OT Frequency: Min 2X/week   Barriers to D/C:            Co-evaluation              End of Session Equipment Utilized During Treatment: Gait belt Nurse Communication: Mobility status  Activity Tolerance: Patient tolerated treatment well Patient left: in chair;with call bell/phone within reach   Time: 1135-1203 OT Time Calculation (min): 28 min Charges:  OT General Charges $OT Visit: 1 Procedure OT  Evaluation $Initial OT Evaluation Tier I: 1 Procedure OT Treatments $Self Care/Home Management : 8-22 mins G-Codes:    Haziel Molner,HILLARY 01-12-2014, 12:14 PM   [facility], OTR/L  [phone] 2014-01-12

## 2013-12-31 NOTE — Progress Notes (Signed)
Mr. Rodwell had a good weekend.  He had a colonoscopy and this showed an obstructive mass in the sigmoid colon. A biopsy was taken. The surgery has see him. Otherwise do a diverting colostomy. This sounds reasonable.  His CEA is over 1200. This is really no surprise.  He did get iron. He tolerated this well. Others no labs on him for [place] to look at right now. If he does need a CT of the chest. I will order this.  Once surgery takes him for the procedure, they will put a Port-A-Cath in. I believe that he still is a good candidate for chemotherapy. Again, I talked him about chemotherapy. He knows that this is not a cure but it can prolong his life and give him a decent quality of life.  His prealbumin is quite low. This is some that is concerning. Hopefully, once he has the diverting coostomy, he will be able to eat better.  He's not complaining of any dizziness. He is on prophylactic Lovenox. I think once he has his surgical procedures, then he go on to aspirin. There've been some recent studies have shown that aspirin does help with GI malignancy.    His vital signs are stable. Blood pressure 145/86. Temperature 98.2. His lungs are clear. Cardiac exam is regular rate and rhythm. Abdomen is soft. He does have hepatomegaly. Extremities shows some muscle atrophy.  We will continue to follow along.  Pete E.

## 2013-12-31 NOTE — Progress Notes (Signed)
Patient ID: [name], male   DOB: 18-Jun-1955, 58 y.o.   MRN: [id number] 1 Day Post-Op  Subjective: Pt without complaints this morning.  Eating some of his full liquids  Objective: Vital signs in last 24 hours: Temp:  [97.8 F (36.6 C)-98.8 F (37.1 C)] 98.1 F (36.7 C) (11/23 0949) Pulse Rate:  [70-81] 73 (11/23 0949) Resp:  [16-18] 16 (11/23 0949) BP: (122-145)/(86-103) 142/103 mmHg (11/23 0949) SpO2:  [98 %-100 %] 100 % (11/23 0949) Last BM Date: 12/30/13  Intake/Output from previous day: 11/22 0701 - 11/23 0700 In: 4465 [P.O.:540; I.V.:3925] Out: 1700 [Urine:1700] Intake/Output this shift: Total I/O In: 360 [P.O.:360] Out: -   PE: Abd: soft, but still distended and still with palpable liver masses on exam Heart: +murmur Lungs: CTAB  Lab Results:  No results for input(s): WBC, HGB, HCT, PLT in the last 72 hours. BMET No results for input(s): NA, K, CL, CO2, GLUCOSE, BUN, CREATININE, CALCIUM in the last 72 hours. PT/INR No results for input(s): LABPROT, INR in the last 72 hours. CMP     Component Value Date/Time   NA 137 12/28/2013 0334   K 3.2* 12/28/2013 0334   CL 97 12/28/2013 0334   CO2 23 12/28/2013 0334   GLUCOSE 93 12/28/2013 0334   BUN 23 12/28/2013 0334   CREATININE 1.28 12/28/2013 0334   CALCIUM 8.5 12/28/2013 0334   PROT 6.9 12/28/2013 0334   ALBUMIN 2.4* 12/28/2013 0334   AST 69* 12/28/2013 0334   ALT 19 12/28/2013 0334   ALKPHOS 279* 12/28/2013 0334   BILITOT 0.5 12/28/2013 0334   GFRNONAA 60* 12/28/2013 0334   GFRAA 70* 12/28/2013 0334   Lipase  No results found for: LIPASE     Studies/Results: Ct Chest W Contrast  12/31/2013   CLINICAL DATA:  Colon cancer with liver metastasis.  EXAM: CT CHEST WITH CONTRAST  TECHNIQUE: Multidetector CT imaging of the chest was performed during intravenous contrast administration.  CONTRAST:  66mL OMNIPAQUE IOHEXOL 300 MG/ML  SOLN  COMPARISON:  Plain films of 12/27/2013. Abdominal pelvic CT of  12/28/2013. Chest CT of 07/20/2010.  FINDINGS: Lungs/Pleura: Calcified granuloma at the right lung base. Right lower lobe subpleural 5 mm nodule on image 31 of series 205. New.  Subpleural left upper lobe 5 mm nodule on image 21 is not identified on the prior exam.  Mild motion degradation.  A left lower lobe 3 mm nodule on image 29 is not readily apparent on the prior exam.  No pleural fluid.  Heart/Mediastinum: No supraclavicular adenopathy. Right anterior chest wall collaterals likely relate to central venous insufficiency at the right brachiocephalic vein. 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 No acute complication identified.  Mild cardiomegaly with left ventricular wall thickening. Prior median sternotomy. Multivessel coronary artery atherosclerosis. No central pulmonary embolism, on this non-dedicated study. No mediastinal or hilar adenopathy.  Upper Abdomen: Innumerable liver masses, better evaluated on dedicated abdominal study. Upper abdominal aortic atherosclerosis. Perisplenic and perihepatic ascites. A suspicion of cirrhosis.  Right greater than left gynecomastia.  Bones/Musculoskeletal:  No acute osseous abnormality.  IMPRESSION: 1. Nonspecific small pulmonary nodules. These are likely new since the remote exam of 07/20/2010. Early pulmonary metastasis cannot be excluded. 2. Status post right sided aortic stent graft repair, without acute complication. 3. Cardiomegaly and left ventricular hypertrophy. 4. Multiple liver masses with suggestion of cirrhosis. This could represent metastatic disease and/or hepatocellular carcinoma/carcinomas. 5. Upper abdominal ascites. 6. Gynecomastia.   Electronically Signed  By: Abigail Miyamoto M.D.   On: 12/31/2013 10:36    Anti-infectives: Anti-infectives    None       Assessment/Plan  1. 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 Liver masses and pulmonary nodules, likely mets 3. S/p aortic stent graph  repair 4. H/o CVAs, unknown to patient until this admit   Plan: 1. Dr. Rosendo Gros will need to evaluate the scan and the patient so we can decide on timing of port placement and diversion.  Hopefully, we can proceed with this tomorrow if he feels able. 2. Npo p MN incase we can proceed tomorrow.  LOS: 4 days    Lasharn Bufkin E 12/31/2013, 10:43 AM Pager: [phone]

## 2014-01-01 ENCOUNTER — Encounter (HOSPITAL_COMMUNITY)
Admission: EM | Disposition: A | Discharge status: Home Health | Payer: Self-pay | Source: Home / Self Care | Attending: Internal Medicine

## 2014-01-01 ENCOUNTER — Encounter (HOSPITAL_COMMUNITY): Payer: Self-pay | Admitting: Gastroenterology

## 2014-01-01 ENCOUNTER — Inpatient Hospital Stay (HOSPITAL_COMMUNITY): Payer: Medicaid Other

## 2014-01-01 DIAGNOSIS — R531 Weakness: Secondary | ICD-10-CM

## 2014-01-01 DIAGNOSIS — K56609 Unspecified intestinal obstruction, unspecified as to partial versus complete obstruction: Secondary | ICD-10-CM | POA: Diagnosis present

## 2014-01-01 HISTORY — PX: COLONIC STENT PLACEMENT: SHX5542

## 2014-01-01 HISTORY — PX: FLEXIBLE SIGMOIDOSCOPY: SHX5431

## 2014-01-01 LAB — CBC
HEMATOCRIT: 31 % — AB (ref 39.0–52.0)
HEMOGLOBIN: 10.2 g/dL — AB (ref 13.0–17.0)
MCH: 26 pg (ref 26.0–34.0)
MCHC: 32.9 g/dL (ref 30.0–36.0)
MCV: 79.1 fL (ref 78.0–100.0)
Platelets: 321 10*3/uL (ref 150–400)
RBC: 3.92 MIL/uL — ABNORMAL LOW (ref 4.22–5.81)
RDW: 18.3 % — AB (ref 11.5–15.5)
WBC: 14 10*3/uL — ABNORMAL HIGH (ref 4.0–10.5)

## 2014-01-01 LAB — BASIC METABOLIC PANEL
Anion gap: 14 (ref 5–15)
BUN: 8 mg/dL (ref 6–23)
CALCIUM: 8.7 mg/dL (ref 8.4–10.5)
CO2: 24 mEq/L (ref 19–32)
Chloride: 99 mEq/L (ref 96–112)
Creatinine, Ser: 0.93 mg/dL (ref 0.50–1.35)
GFR calc Af Amer: >90 mL/min (ref >90)
GFR calc non Af Amer: >90 mL/min (ref >90)
GLUCOSE: 86 mg/dL (ref 70–99)
POTASSIUM: 2.9 meq/L — AB (ref 3.7–5.3)
Sodium: 137 mEq/L (ref 137–147)

## 2014-01-01 LAB — GLUCOSE, CAPILLARY
GLUCOSE-CAPILLARY: 91 mg/dL (ref 70–99)
Glucose-Capillary: 78 mg/dL (ref 70–99)
Glucose-Capillary: 91 mg/dL (ref 70–99)

## 2014-01-01 SURGERY — SIGMOIDOSCOPY, FLEXIBLE
Anesthesia: Moderate Sedation

## 2014-01-01 MED ORDER — GLUCAGON HCL RDNA (DIAGNOSTIC) 1 MG IJ SOLR
INTRAMUSCULAR | Status: AC
  Filled 2014-01-01: qty 1

## 2014-01-01 MED ORDER — SODIUM CHLORIDE 0.9 % IV SOLN
INTRAVENOUS | Status: DC
  Administered 2014-01-01: 13:00:00 via INTRAVENOUS
  Filled 2014-01-01 (×2): qty 1000

## 2014-01-01 MED ORDER — ISOSORB DINITRATE-HYDRALAZINE 20-37.5 MG PO TABS
1.0000 | ORAL_TABLET | Freq: Three times a day (TID) | ORAL | Status: DC
  Administered 2014-01-01 – 2014-01-12 (×34): 1 via ORAL
  Filled 2014-01-01 (×39): qty 1

## 2014-01-01 MED ORDER — GLUCAGON HCL RDNA (DIAGNOSTIC) 1 MG IJ SOLR
INTRAMUSCULAR | Status: DC | PRN
  Administered 2014-01-01 (×2): 1 mg via INTRAVENOUS

## 2014-01-01 MED ORDER — POTASSIUM CHLORIDE 2 MEQ/ML IV SOLN
INTRAVENOUS | Status: DC
  Filled 2014-01-01 (×2): qty 1000

## 2014-01-01 MED ORDER — FENTANYL CITRATE 0.05 MG/ML IJ SOLN
INTRAMUSCULAR | Status: AC
  Filled 2014-01-01: qty 4

## 2014-01-01 MED ORDER — FENTANYL CITRATE 0.05 MG/ML IJ SOLN
INTRAMUSCULAR | Status: DC | PRN
  Administered 2014-01-01 (×6): 25 ug via INTRAVENOUS

## 2014-01-01 MED ORDER — SODIUM CHLORIDE 0.9 % IJ SOLN
INTRAMUSCULAR | Status: DC | PRN
  Administered 2014-01-01: 60 mL

## 2014-01-01 MED ORDER — DIPHENHYDRAMINE HCL 50 MG/ML IJ SOLN
INTRAMUSCULAR | Status: AC
  Filled 2014-01-01: qty 1

## 2014-01-01 MED ORDER — FENTANYL CITRATE 0.05 MG/ML IJ SOLN
INTRAMUSCULAR | Status: AC
  Filled 2014-01-01: qty 2

## 2014-01-01 MED ORDER — MIDAZOLAM HCL 5 MG/ML IJ SOLN
INTRAMUSCULAR | Status: AC
  Filled 2014-01-01: qty 4

## 2014-01-01 MED ORDER — POTASSIUM CHLORIDE CRYS ER 20 MEQ PO TBCR
40.0000 meq | EXTENDED_RELEASE_TABLET | ORAL | Status: AC
  Administered 2014-01-01 (×3): 40 meq via ORAL
  Filled 2014-01-01 (×3): qty 2

## 2014-01-01 MED ORDER — POTASSIUM CHLORIDE 10 MEQ/100ML IV SOLN
10.0000 meq | INTRAVENOUS | Status: DC
  Filled 2014-01-01 (×3): qty 100

## 2014-01-01 MED ORDER — MIDAZOLAM HCL 10 MG/2ML IJ SOLN
INTRAMUSCULAR | Status: DC | PRN
  Administered 2014-01-01 (×2): 2 mg via INTRAVENOUS
  Administered 2014-01-01: 1 mg via INTRAVENOUS
  Administered 2014-01-01 (×2): 2.5 mg via INTRAVENOUS
  Administered 2014-01-01: 2 mg via INTRAVENOUS

## 2014-01-01 NOTE — Plan of Care (Signed)
Problem: Progression Outcomes Goal: Educational plan initiated Outcome: Completed/Met Date Met:  01/01/14

## 2014-01-01 NOTE — H&P (View-Only) (Signed)
I have examined the patient and reviewed the x-rays.  He has an obstructing sigmoid cancer with liver mets.  I think it is worthwhile attempting colonic stent placement.  If successful we may be able to avoid a colostomy.  Risks discussed with pt including bleeding and perforation.  Pt is agreeable.  Plan sigmoidoscopy in am.

## 2014-01-01 NOTE — Progress Notes (Signed)
Pt having increased BP and respirations. Dyann Kief MD made aware. Per Dyann Kief, will adjust BP medications. Will not order anything at current moment. Will continue to monitor pt.

## 2014-01-01 NOTE — Plan of Care (Signed)
Problem: Phase I Progression Outcomes Goal: OOB as tolerated unless otherwise ordered Outcome: Progressing     

## 2014-01-01 NOTE — Progress Notes (Signed)
CRITICAL VALUE ALERT  Critical value received:  K+ 2.9  Date of notification:  01/01/14  Time of notification:  0712  Critical value read back:Yes.    Nurse who received alert:  Audria Nine  MD notified (1st page):  [place]   Time of first page:  0715  MD notified (2nd page):  Time of second page:  Responding MD:  Dyann Kief  Time MD responded:  [phone]

## 2014-01-01 NOTE — Plan of Care (Signed)
Problem: Acute Treatment Outcomes Goal: BP within ordered parameters Outcome: Progressing BP has been elevated. MD to adjust BP medications.  Goal: Prognosis discussed with family/patient as appropriate Outcome: Completed/Met Date Met:  01/01/14  Problem: Progression Outcomes Goal: Initial discharge plan initiated Outcome: Completed/Met Date Met:  01/01/14 Goal: Other Progression Outcomes Outcome: Not Applicable Date Met:  44/39/26  Problem: Discharge/Transitional Outcomes Goal: Educational Plan Complete Outcome: Progressing Goal: Tolerating diet/TF at goal rate-PEG if inadequate intake Outcome: Completed/Met Date Met:  01/01/14 Goal: Family/Caregiver willing and able to support plan Family/Caregiver willing and able to support plan for self-management after transition home  Outcome: Completed/Met Date Met:  01/01/14

## 2014-01-01 NOTE — Progress Notes (Signed)
[facility] Progress Note  Day of Surgery  Subjective: Just got back from Endo.  Had successful stent placed.  Pt c/o distension, not any N/V.  Some pain.  No flatus or BM yet.    Objective: Vital signs in last 24 hours: Temp:  [97.8 F (36.6 C)-99.6 F (37.6 C)] 99.6 F (37.6 C) (11/24 0721) Pulse Rate:  [67-84] 73 (11/24 0900) Resp:  [16-26] 21 (11/24 0900) BP: (121-174)/(88-135) 138/112 mmHg (11/24 0900) SpO2:  [96 %-100 %] 100 % (11/24 0900) Last BM Date: 12/31/13  Intake/Output from previous day: 11/23 0701 - 11/24 0700 In: 360 [P.O.:360] Out: 800 [Urine:800] Intake/Output this shift:    PE: Gen:  Alert, NAD, pleasant Abd: Soft, distended, mildly tender to palpation in LLQ, +BS, no HSM   Lab Results:   Recent Labs  01/01/14 0543  WBC 14.0*  HGB 10.2*  HCT 31.0*  PLT 321   BMET  Recent Labs  01/01/14 0543  NA 137  K 2.9*  CL 99  CO2 24  GLUCOSE 86  BUN 8  CREATININE 0.93  CALCIUM 8.7   PT/INR No results for input(s): LABPROT, INR in the last 72 hours. CMP     Component Value Date/Time   NA 137 01/01/2014 0543   K 2.9* 01/01/2014 0543   CL 99 01/01/2014 0543   CO2 24 01/01/2014 0543   GLUCOSE 86 01/01/2014 0543   BUN 8 01/01/2014 0543   CREATININE 0.93 01/01/2014 0543   CALCIUM 8.7 01/01/2014 0543   PROT 6.9 12/28/2013 0334   ALBUMIN 2.4* 12/28/2013 0334   AST 69* 12/28/2013 0334   ALT 19 12/28/2013 0334   ALKPHOS 279* 12/28/2013 0334   BILITOT 0.5 12/28/2013 0334   GFRNONAA >90 01/01/2014 0543   GFRAA >90 01/01/2014 0543   Lipase  No results found for: LIPASE     Studies/Results: Ct Chest W Contrast  12/31/2013   CLINICAL DATA:  Colon cancer with liver metastasis.  EXAM: CT CHEST WITH CONTRAST  TECHNIQUE: Multidetector CT imaging of the chest was performed during intravenous contrast administration.  CONTRAST:  44mL OMNIPAQUE IOHEXOL 300 MG/ML  SOLN  COMPARISON:  Plain films of 12/27/2013. Abdominal pelvic CT of  12/28/2013. Chest CT of 07/20/2010.  FINDINGS: Lungs/Pleura: Calcified granuloma at the right lung base. Right lower lobe subpleural 5 mm nodule on image 31 of series 205. New.  Subpleural left upper lobe 5 mm nodule on image 21 is not identified on the prior exam.  Mild motion degradation.  A left lower lobe 3 mm nodule on image 29 is not readily apparent on the prior exam.  No pleural fluid.  Heart/Mediastinum: No supraclavicular adenopathy. Right anterior chest wall collaterals likely relate to central venous insufficiency at the right brachiocephalic vein. 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 No acute complication identified.  Mild cardiomegaly with left ventricular wall thickening. Prior median sternotomy. Multivessel coronary artery atherosclerosis. No central pulmonary embolism, on this non-dedicated study. No mediastinal or hilar adenopathy.  Upper Abdomen: Innumerable liver masses, better evaluated on dedicated abdominal study. Upper abdominal aortic atherosclerosis. Perisplenic and perihepatic ascites. A suspicion of cirrhosis.  Right greater than left gynecomastia.  Bones/Musculoskeletal:  No acute osseous abnormality.  IMPRESSION: 1. Nonspecific small pulmonary nodules. These are likely new since the remote exam of 07/20/2010. Early pulmonary metastasis cannot be excluded. 2. Status post right sided aortic stent graft repair, without acute complication. 3. Cardiomegaly and left ventricular hypertrophy. 4. Multiple liver masses with  suggestion of cirrhosis. This could represent metastatic disease and/or hepatocellular carcinoma/carcinomas. 5. Upper abdominal ascites. 6. Gynecomastia.   Electronically Signed   By: Abigail Miyamoto M.D.   On: 12/31/2013 10:36    Anti-infectives: Anti-infectives    None       Assessment/Plan 1. Obstructing sigmoid colon mass, shows low and high grade adenamatous dysplasia 2. Liver masses and pulmonary nodules, likely mets 3. S/p aortic  stent graph repair 4. H/o CVAs, unknown to patient until this admit   Plan: 1.  GI successfully placed a stent.   2.  Dr. [name] (oncology) recommends first proceeding with chemotherapy so we will place a port tomorrow 3.  Will call for cards clearance in case his obstruction does not resolve and we need to do a more invasive surgery like a diverting ostomy.  His Cardiologist is Dr. Einar Gip, I have called and left him a message. 4.  Will possibly need biopsy of liver (per IR?) so that Dr. [name] can get genetic testing to know which treatments to prescribe.    LOS: 5 days    Coralie Keens 01/01/2014, 9:59 AM Pager: [phone]

## 2014-01-01 NOTE — Plan of Care (Signed)
Problem: Progression Outcomes Goal: Communication method established Outcome: Completed/Met Date Met:  01/01/14  Problem: Discharge/Transitional Outcomes Goal: Hemodynamically stable Outcome: Progressing Goal: Independent mobility/functioning independent or with min Independent mobility/functioning independently or with minimal assistance  Outcome: Completed/Met Date Met:  01/01/14

## 2014-01-01 NOTE — Op Note (Signed)
[facility] [place], [id number]   11FLEXIBLE SIGMOIDOSCOPY PROCEDURE REPORT  PATIENT: [name], [name]  MR#: [id number] BIRTHDATE: [birth year] , 81  yrs. old GENDER: male ENDOSCOPIST: Inda Castle, MD REFERRED BY: Lafayette Dragon, M.D. PROCEDURE DATE:  01/01/2014 PROCEDURE:   Sigmoidoscopy with balloon dilation and Sigmoidoscopy with stent ASA CLASS:   Class III INDICATIONS:therapy of for previously diagnosed cancer, colorectal. MEDICATIONS: Fentanyl 150 mcg IV and 12 mg IV  DESCRIPTION OF PROCEDURE:   After the risks benefits and alternatives of the procedure were thoroughly explained, informed consent was obtained.  Digital exam revealed no abnormalities of the rectum. The     endoscope was introduced through the anus  and advanced to the sigmoid colon , The exam was Without limitations. The quality of the prep was    .  The instrument was then slowly withdrawn as the mucosa was fully examined.         COLON FINDINGS: A bleeding malignant tumor/mass was seen in the sigmoid colon at approximately 25 cm from the anus.  The tumor was obstructing.  Under fluoroscopic guidance a 0.35 mm wire was passed through the tumor into the more proximal sigmoid and descending colon.  A 22 mm x 9 cm colonic wall flex was placed across the strictured area.  Because a tight stricture remained following deployment of the stent, measuring approximately 3 -3mm  balloon dilation of the stricture was commenced.  Numbers 8?"9?"10-11-12mm by 5cm balloon dilators were inflated for 30 seconds each.  Follow-up fluoroscopic image verified that the stent was expanded to at least 12 mm. Retroflexion was not performed.    The scope was then withdrawn from the patient and the procedure terminated.  COMPLICATIONS: There were no immediate complications.  ENDOSCOPIC IMPRESSION: Bleedingobstructing malignant tumor/mass in the sigmoid colon - status post placement of  colonic stent  RECOMMENDATIONS: follow-up KUB in a.m.  REPEAT EXAM:  eSigned:  Inda Castle, MD 01/01/2014 10:16 AM   CC:

## 2014-01-01 NOTE — Progress Notes (Signed)
PT Cancellation Note  Patient Details Name: Harshaan Whang MRN: [id number] DOB: 30-Jun-1955   Cancelled Treatment:    Reason Eval/Treat Not Completed: Patient declined, no reason specified.  Too uncomfortable from Sigmoidoscopy. 01/01/2014  Donnella Sham, [place] [phone]  (pager)   Mesha Schamberger, [name] 01/01/2014, 5:32 PM

## 2014-01-01 NOTE — Progress Notes (Signed)
[facility] PROGRESS NOTE  [name] GEX:[id number] DOB: March 18, 1955 DOA: 12/27/2013 PCP: No primary care provider on file.  Interim summary 58 y/o male with PMH hx significant for HTN, HLD, hx of CVA, aortic stenosis and repair of aortic aneurysm; presented with complaints of dizziness. Patient also endorses unintentional weight loss (>30 pounds) and some abd discomfort. A CT abdomen/pelvis was done and demonstrated a sigmoid mass with high suspicious for colon cancer with liver metastasis. from dizziness work up, the patient was found to have had several CVAs, likely embolic in nature (potentially related with hypercoagulable state). GI was consulted and a colonoscopy was obtained for tissue sample and reveled almost complete obstruction of colon. Oncology has been contacted to help with staging and they are anticipating chemotherapy treatment. Surgery is on board for diverting colostomy and placement of Port A Cath. Patient doesn't not present any neurologic or motor deficit from new stroke.   Assessment/Plan: 1-dizziness and lightheadedness: secondary to acute/subacute punctate cortical/subcorticals stroke -patient with risk factors including HTN, HLD, small vessel disease and previous strokes. -also concerns of potential hypercoagulable state from malignancy; I think he has both; but given whole picture will favor full dose ASA and risk factor modifications. -per neurology rec's treat with lovenox for now and switch to full dose ASA when tolerating PO's -was on ASA, but not recommended by oncology given need of biopsies and invasives procedures coming. -no major deficit or neurologic issues appreciated -will continue using meclizine PRN for dizziness   2-abd mass/almost complete colon obstruction: most likely colon cancer with liver and potentially lungs metastasis -colonoscopy done for specimen collection and pathology (pending) -oncology on board helping with staging; will need port  A cath and chemotherapy -CT chest suggesting metastatic nodularity in his lungs -will arrange port a cath placement by surgery -patient is status post sigmoidoscopy with metal stenting to help with narrowing give risk for surgery and severe hepatomegaly  3-HTN: stable to mildly elevated. -continue home regimen plus use of BIDIL TID for better control  4-HLD: not at goal -LDL elevated; statins on hold due to transaminatis and metastatic liver disease -advise to follow low fat diet  5-depression and insomnia: due to newly diagnose malignancy -will continue celexa and use trazodone  6-severe protein calorie malnutrition: continue feeding supplementation -follow dietitian rec's  Code Status: Full Family Communication: no family at bedside  Disposition Plan: to be determine    Consultants:  GI  Neurology  Oncology   General surgery   Procedures:  See below for x-ray reports  Colonoscopy (done on 11/22; demonstrated almost complete obstruction of his colon; tissue sample taken)  Sigmoidoscopy with metal stent placement on 01/01/14  Antibiotics:  None   HPI/Subjective: Patient is in NAD; denies CP, SOB, nausea and vomiting. Afebrile. Mood is depressed and is having trouble with insomnia.  Objective: Filed Vitals:   01/01/14 1425  BP: 157/103  Pulse: 81  Temp: 99.2 F (37.3 C)  Resp: 30    Intake/Output Summary (Last 24 hours) at 01/01/14 1730 Last data filed at 01/01/14 1634  Gross per 24 hour  Intake    310 ml  Output    900 ml  Net   -590 ml   Filed Weights   12/27/13 2125 12/28/13 0302  Weight: 72.576 kg (160 lb) 72 kg (158 lb 11.7 oz)    Exam:   General:  AAOX3, afebrile, no CP or SOB; patient denies nausea or vomiting. His mood is down and continue having trouble with insomnia.  Cardiovascular: positive SEM, no rubs or gallops  Respiratory: good air movement, no wheezing, no crackles  Abdomen: distended, with palpable mass mid/left  quadrant; also hepatomegaly appreciated on exam. Positive BS  Musculoskeletal: no edema or cyanosis   Data Reviewed: Basic Metabolic Panel:  Recent Labs Lab 12/27/13 2127 12/28/13 0334 01/01/14 0543  NA 135* 137 137  K 3.7 3.2* 2.9*  CL 93* 97 99  CO2 21 23 24   GLUCOSE 104* 93 86  BUN 24* 23 8  CREATININE 1.56* 1.28 0.93  CALCIUM 9.0 8.5 8.7   Liver Function Tests:  Recent Labs Lab 12/27/13 2127 12/28/13 0334  AST 73* 69*  ALT 21 19  ALKPHOS 330* 279*  BILITOT 0.6 0.5  PROT 7.9 6.9  ALBUMIN 2.7* 2.4*   CBC:  Recent Labs Lab 12/27/13 2127 12/28/13 0334 01/01/14 0543  WBC 15.1* 13.5* 14.0*  NEUTROABS 12.3* 9.5*  --   HGB 10.9* 9.8* 10.2*  HCT 32.3* 29.3* 31.0*  MCV 77.1* 79.6 79.1  PLT 353 289 321   CBG:  Recent Labs Lab 12/31/13 0756 12/31/13 1156 12/31/13 1635 12/31/13 2122 01/01/14 1102  GLUCAP 94 81 106* 107* 78    Recent Results (from the past 240 hour(s))  Culture, blood (routine x 2)     Status: None (Preliminary result)   Collection Time: 12/28/13  3:34 AM  Result Value Ref Range Status   Specimen Description BLOOD RIGHT ARM  Final   Special Requests BOTTLES DRAWN AEROBIC AND ANAEROBIC 10 CC  Final   Culture  Setup Time   Final    12/28/2013 09:26 Performed at [facility]    Culture   Final           BLOOD CULTURE RECEIVED NO GROWTH TO DATE CULTURE WILL BE HELD FOR 5 DAYS BEFORE ISSUING A FINAL NEGATIVE REPORT Performed at [facility]    Report Status PENDING  Incomplete  Culture, blood (routine x 2)     Status: None (Preliminary result)   Collection Time: 12/28/13  3:46 AM  Result Value Ref Range Status   Specimen Description BLOOD LEFT FOREARM  Final   Special Requests BOTTLES DRAWN AEROBIC AND ANAEROBIC 10 CC  Final   Culture  Setup Time   Final    12/28/2013 09:26 Performed at [facility]    Culture   Final           BLOOD CULTURE RECEIVED NO GROWTH TO DATE CULTURE WILL BE HELD FOR 5 DAYS BEFORE  ISSUING A FINAL NEGATIVE REPORT Performed at [facility]    Report Status PENDING  Incomplete     Studies: Ct Chest W Contrast  12/31/2013   CLINICAL DATA:  Colon cancer with liver metastasis.  EXAM: CT CHEST WITH CONTRAST  TECHNIQUE: Multidetector CT imaging of the chest was performed during intravenous contrast administration.  CONTRAST:  50mL OMNIPAQUE IOHEXOL 300 MG/ML  SOLN  COMPARISON:  Plain films of 12/27/2013. Abdominal pelvic CT of 12/28/2013. Chest CT of 07/20/2010.  FINDINGS: Lungs/Pleura: Calcified granuloma at the right lung base. Right lower lobe subpleural 5 mm nodule on image 31 of series 205. New.  Subpleural left upper lobe 5 mm nodule on image 21 is not identified on the prior exam.  Mild motion degradation.  A left lower lobe 3 mm nodule on image 29 is not readily apparent on the prior exam.  No pleural fluid.  Heart/Mediastinum: No supraclavicular adenopathy. Right anterior chest wall collaterals likely relate to central  venous insufficiency at the right brachiocephalic vein. 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 No acute complication identified.  Mild cardiomegaly with left ventricular wall thickening. Prior median sternotomy. Multivessel coronary artery atherosclerosis. No central pulmonary embolism, on this non-dedicated study. No mediastinal or hilar adenopathy.  Upper Abdomen: Innumerable liver masses, better evaluated on dedicated abdominal study. Upper abdominal aortic atherosclerosis. Perisplenic and perihepatic ascites. A suspicion of cirrhosis.  Right greater than left gynecomastia.  Bones/Musculoskeletal:  No acute osseous abnormality.  IMPRESSION: 1. Nonspecific small pulmonary nodules. These are likely new since the remote exam of 07/20/2010. Early pulmonary metastasis cannot be excluded. 2. Status post right sided aortic stent graft repair, without acute complication. 3. Cardiomegaly and left ventricular hypertrophy. 4. Multiple liver  masses with suggestion of cirrhosis. This could represent metastatic disease and/or hepatocellular carcinoma/carcinomas. 5. Upper abdominal ascites. 6. Gynecomastia.   Electronically Signed   By: Abigail Miyamoto M.D.   On: 12/31/2013 10:36   Dg Abd 2 Views  01/01/2014   CLINICAL DATA:  Colonic mass.  EXAM: DG C-ARM 61-120 MIN; ABDOMEN - 2 VIEW  COMPARISON:  CT scan of December 28, 2013.  FINDINGS: Ten fluoroscopic images were obtained during stent placement through colonic mass via colonoscopy.  IMPRESSION: Stent placement for treatment of colonic mass.   Electronically Signed   By: Sabino Dick M.D.   On: 01/01/2014 11:19   Dg C-arm 61-120 Min  01/01/2014   CLINICAL DATA:  Colonic mass.  EXAM: DG C-ARM 61-120 MIN; ABDOMEN - 2 VIEW  COMPARISON:  CT scan of December 28, 2013.  FINDINGS: Ten fluoroscopic images were obtained during stent placement through colonic mass via colonoscopy.  IMPRESSION: Stent placement for treatment of colonic mass.   Electronically Signed   By: Sabino Dick M.D.   On: 01/01/2014 11:19    Scheduled Meds: . citalopram  10 mg Oral Daily  . enoxaparin (LOVENOX) injection  40 mg Subcutaneous Q24H  . feeding supplement (ENSURE COMPLETE)  237 mL Oral TID BM  . isosorbide-hydrALAZINE  1 tablet Oral TID  . metoprolol succinate  50 mg Oral Daily  . nicotine  7 mg Transdermal Daily  . pantoprazole  40 mg Oral Daily  . potassium chloride  40 mEq Oral Q4H   Continuous Infusions: . sodium chloride 0.9 % 1,000 mL with potassium chloride 20 mEq infusion 50 mL/hr at 01/01/14 1246    Principal Problem:   AKI (acute kidney injury) Active Problems:   Lactic acidosis   Cerebral embolism with cerebral infarction   Abnormal LFTs   Leucocytosis   Dizziness and giddiness   Abdominal mass   Loss of weight   Acute kidney injury   Hepatomegalia   HLD (hyperlipidemia)   Malignant neoplasm of colon   Intestinal obstruction   Colon cancer metastasized to liver   Protein-calorie  malnutrition, severe   Benign essential HTN    Time spent: 30 minutes     Barton Dubois  [facility] Pager [phone]. If 7PM-7AM, please contact night-coverage at [email or web address], password [facility] 01/01/2014, 5:30 PM  LOS: 5 days

## 2014-01-01 NOTE — Progress Notes (Signed)
After much resistance, i was able to finally give pt his first enema. Attempted to give pt his second enema and he refused. Pt states he'll take it later but can't tolerate it right now. It is too painful. Paged  GI, awaiting a call back.

## 2014-01-01 NOTE — Progress Notes (Signed)
High-grade colonic stricture was successfully stented with a 22 mm x 9 cm metal stent.  Recommendations #1 clear liquids #2 follow-up KUB in a.m. to measure stent expansion

## 2014-01-01 NOTE — Interval H&P Note (Signed)
History and Physical Interval Note:  01/01/2014 7:40 AM  [name]  has presented today for surgery, with the diagnosis of sigmoid mass  The various methods of treatment have been discussed with the patient and family. After consideration of risks, benefits and other options for treatment, the patient has consented to  Procedure(s) with comments: FLEXIBLE SIGMOIDOSCOPY (N/A) - with stent placement COLONIC STENT PLACEMENT (N/A) as a surgical intervention .  The patient's history has been reviewed, patient examined, no change in status, stable for surgery.  I have reviewed the patient's chart and labs.  Questions were answered to the patient's satisfaction.     The recent H&P (dated *12/31/13**) was reviewed, the patient was examined and there is no change in the patients condition since that H&P was completed.   Erskine Emery  01/01/2014, 7:41 AM   Erskine Emery

## 2014-01-02 ENCOUNTER — Inpatient Hospital Stay (HOSPITAL_COMMUNITY): Payer: Medicaid Other | Admitting: Anesthesiology

## 2014-01-02 ENCOUNTER — Encounter (HOSPITAL_COMMUNITY): Payer: Self-pay | Admitting: Gastroenterology

## 2014-01-02 ENCOUNTER — Inpatient Hospital Stay (HOSPITAL_COMMUNITY): Payer: Medicaid Other

## 2014-01-02 ENCOUNTER — Encounter (HOSPITAL_COMMUNITY)
Admission: EM | Disposition: A | Discharge status: Home Health | Payer: Self-pay | Source: Home / Self Care | Attending: Internal Medicine

## 2014-01-02 DIAGNOSIS — K5669 Other intestinal obstruction: Secondary | ICD-10-CM

## 2014-01-02 DIAGNOSIS — K5660 Unspecified intestinal obstruction: Secondary | ICD-10-CM

## 2014-01-02 HISTORY — PX: PORTACATH PLACEMENT: SHX2246

## 2014-01-02 LAB — SURGICAL PCR SCREEN
MRSA, PCR: NEGATIVE
STAPHYLOCOCCUS AUREUS: POSITIVE — AB

## 2014-01-02 LAB — GLUCOSE, CAPILLARY
GLUCOSE-CAPILLARY: 95 mg/dL (ref 70–99)
GLUCOSE-CAPILLARY: 88 mg/dL (ref 70–99)
GLUCOSE-CAPILLARY: 123 mg/dL — AB (ref 70–99)
Glucose-Capillary: 92 mg/dL (ref 70–99)

## 2014-01-02 SURGERY — INSERTION, TUNNELED CENTRAL VENOUS DEVICE, WITH PORT
Anesthesia: General

## 2014-01-02 MED ORDER — FENTANYL CITRATE 0.05 MG/ML IJ SOLN
INTRAMUSCULAR | Status: DC | PRN
  Administered 2014-01-02: 50 ug via INTRAVENOUS

## 2014-01-02 MED ORDER — CEFAZOLIN SODIUM-DEXTROSE 2-3 GM-% IV SOLR
INTRAVENOUS | Status: DC | PRN
  Administered 2014-01-02: 2 g via INTRAVENOUS

## 2014-01-02 MED ORDER — SODIUM CHLORIDE 0.9 % IR SOLN
Status: DC | PRN
  Administered 2014-01-02: 15:00:00

## 2014-01-02 MED ORDER — MIDAZOLAM HCL 2 MG/2ML IJ SOLN
INTRAMUSCULAR | Status: AC
  Filled 2014-01-02: qty 2

## 2014-01-02 MED ORDER — ONDANSETRON HCL 4 MG/2ML IJ SOLN
INTRAMUSCULAR | Status: DC | PRN
  Administered 2014-01-02: 4 mg via INTRAVENOUS

## 2014-01-02 MED ORDER — LACTATED RINGERS IV SOLN
INTRAVENOUS | Status: DC
  Administered 2014-01-02: 12:00:00 via INTRAVENOUS

## 2014-01-02 MED ORDER — POTASSIUM CHLORIDE 10 MEQ/100ML IV SOLN
10.0000 meq | Freq: Once | INTRAVENOUS | Status: AC
  Administered 2014-01-02: 10 meq via INTRAVENOUS
  Filled 2014-01-02: qty 100

## 2014-01-02 MED ORDER — STERILE WATER FOR INJECTION IJ SOLN
INTRAMUSCULAR | Status: AC
  Filled 2014-01-02: qty 10

## 2014-01-02 MED ORDER — OXYCODONE HCL 5 MG/5ML PO SOLN: 5.0000 mg | Freq: Once | ORAL | Status: DC | PRN

## 2014-01-02 MED ORDER — BUPIVACAINE HCL 0.25 % IJ SOLN
INTRAMUSCULAR | Status: DC | PRN
  Administered 2014-01-02: 7 mL

## 2014-01-02 MED ORDER — LIDOCAINE HCL (CARDIAC) 20 MG/ML IV SOLN
INTRAVENOUS | Status: DC | PRN
  Administered 2014-01-02: 60 mg via INTRAVENOUS

## 2014-01-02 MED ORDER — 0.9 % SODIUM CHLORIDE (POUR BTL) OPTIME
TOPICAL | Status: DC | PRN
  Administered 2014-01-02: 1000 mL

## 2014-01-02 MED ORDER — EPHEDRINE SULFATE 50 MG/ML IJ SOLN
INTRAMUSCULAR | Status: AC
  Filled 2014-01-02: qty 1

## 2014-01-02 MED ORDER — ROCURONIUM BROMIDE 50 MG/5ML IV SOLN
INTRAVENOUS | Status: AC
  Filled 2014-01-02: qty 3

## 2014-01-02 MED ORDER — GLYCOPYRROLATE 0.2 MG/ML IJ SOLN
INTRAMUSCULAR | Status: AC
  Filled 2014-01-02: qty 2

## 2014-01-02 MED ORDER — LIDOCAINE HCL (CARDIAC) 20 MG/ML IV SOLN
INTRAVENOUS | Status: AC
  Filled 2014-01-02: qty 15

## 2014-01-02 MED ORDER — FENTANYL CITRATE 0.05 MG/ML IJ SOLN: 25.0000 ug | INTRAMUSCULAR | Status: DC | PRN

## 2014-01-02 MED ORDER — NEOSTIGMINE METHYLSULFATE 10 MG/10ML IV SOLN
INTRAVENOUS | Status: AC
  Filled 2014-01-02: qty 1

## 2014-01-02 MED ORDER — PROPOFOL 10 MG/ML IV BOLUS
INTRAVENOUS | Status: AC
  Filled 2014-01-02: qty 20

## 2014-01-02 MED ORDER — OXYCODONE HCL 5 MG PO TABS: 5.0000 mg | ORAL_TABLET | Freq: Once | ORAL | Status: DC | PRN

## 2014-01-02 MED ORDER — PROPOFOL 10 MG/ML IV BOLUS
INTRAVENOUS | Status: DC | PRN
  Administered 2014-01-02: 120 mg via INTRAVENOUS

## 2014-01-02 MED ORDER — FENTANYL CITRATE 0.05 MG/ML IJ SOLN
INTRAMUSCULAR | Status: AC
  Filled 2014-01-02: qty 5

## 2014-01-02 MED ORDER — SODIUM CHLORIDE 0.9 % IV SOLN
INTRAVENOUS | Status: DC
  Administered 2014-01-02: 08:00:00 via INTRAVENOUS
  Filled 2014-01-02 (×4): qty 1000

## 2014-01-02 MED ORDER — SODIUM CHLORIDE 0.9 % IJ SOLN: 10.0000 mL | INTRAMUSCULAR | Status: DC | PRN

## 2014-01-02 MED ORDER — BUPIVACAINE HCL (PF) 0.25 % IJ SOLN
INTRAMUSCULAR | Status: AC
  Filled 2014-01-02: qty 30

## 2014-01-02 MED ORDER — PHENYLEPHRINE 40 MCG/ML (10ML) SYRINGE FOR IV PUSH (FOR BLOOD PRESSURE SUPPORT)
PREFILLED_SYRINGE | INTRAVENOUS | Status: AC
  Filled 2014-01-02: qty 30

## 2014-01-02 SURGICAL SUPPLY — 67 items
BAG DECANTER FOR FLEXI CONT (MISCELLANEOUS) ×3 IMPLANT
BIOPATCH RED 1 DISK 7.0 (GAUZE/BANDAGES/DRESSINGS) ×2 IMPLANT
BIOPATCH RED 1IN DISK 7.0MM (GAUZE/BANDAGES/DRESSINGS) ×1
BLADE SURG 11 STRL SS (BLADE) ×3 IMPLANT
BLADE SURG 15 STRL LF DISP TIS (BLADE) ×1 IMPLANT
BLADE SURG 15 STRL SS (BLADE) ×2
BLADE SURG ROTATE 9660 (MISCELLANEOUS) ×3 IMPLANT
CANISTER SUCTION 2500CC (MISCELLANEOUS) IMPLANT
CHLORAPREP W/TINT 10.5 ML (MISCELLANEOUS) ×3 IMPLANT
COVER SURGICAL LIGHT HANDLE (MISCELLANEOUS) ×3 IMPLANT
COVER TRANSDUCER ULTRASND GEL (DRAPE) ×3 IMPLANT
CRADLE DONUT ADULT HEAD (MISCELLANEOUS) ×3 IMPLANT
DECANTER SPIKE VIAL GLASS SM (MISCELLANEOUS) ×3 IMPLANT
DERMABOND ADVANCED (GAUZE/BANDAGES/DRESSINGS) ×2
DERMABOND ADVANCED .7 DNX12 (GAUZE/BANDAGES/DRESSINGS) ×1 IMPLANT
DRAPE C-ARM 42X72 X-RAY (DRAPES) ×3 IMPLANT
DRAPE CHEST BREAST 15X10 FENES (DRAPES) ×3 IMPLANT
DRAPE UTILITY W/TAPE 26X15 (DRAPES) ×3 IMPLANT
DRAPE UTILITY XL STRL (DRAPES) IMPLANT
DRSG COVADERM 4X6 (GAUZE/BANDAGES/DRESSINGS) ×3 IMPLANT
ELECT CAUTERY BLADE 6.4 (BLADE) ×6 IMPLANT
ELECT REM PT RETURN 9FT ADLT (ELECTROSURGICAL) ×3
ELECTRODE REM PT RTRN 9FT ADLT (ELECTROSURGICAL) ×1 IMPLANT
GAUZE SPONGE 4X4 16PLY XRAY LF (GAUZE/BANDAGES/DRESSINGS) ×3 IMPLANT
GLOVE BIO SURGEON STRL SZ7.5 (GLOVE) ×3 IMPLANT
GLOVE BIOGEL PI IND STRL 6.5 (GLOVE) ×1 IMPLANT
GLOVE BIOGEL PI IND STRL 7.0 (GLOVE) ×1 IMPLANT
GLOVE BIOGEL PI IND STRL 8 (GLOVE) ×1 IMPLANT
GLOVE BIOGEL PI INDICATOR 6.5 (GLOVE) ×2
GLOVE BIOGEL PI INDICATOR 7.0 (GLOVE) ×2
GLOVE BIOGEL PI INDICATOR 8 (GLOVE) ×2
GLOVE SURG SS PI 7.0 STRL IVOR (GLOVE) ×3 IMPLANT
GOWN STRL REUS W/ TWL LRG LVL3 (GOWN DISPOSABLE) ×2 IMPLANT
GOWN STRL REUS W/ TWL XL LVL3 (GOWN DISPOSABLE) ×1 IMPLANT
GOWN STRL REUS W/TWL LRG LVL3 (GOWN DISPOSABLE) ×4
GOWN STRL REUS W/TWL XL LVL3 (GOWN DISPOSABLE) ×2
INTRODUCER 13FR (MISCELLANEOUS) IMPLANT
INTRODUCER COOK 11FR (CATHETERS) IMPLANT
KIT BASIN OR (CUSTOM PROCEDURE TRAY) ×3 IMPLANT
KIT PORT POWER 8FR ISP CVUE (Catheter) ×3 IMPLANT
KIT PORT POWER ISP 8FR (Catheter) IMPLANT
KIT POWER CATH 8FR (Catheter) IMPLANT
KIT ROOM TURNOVER OR (KITS) ×3 IMPLANT
NEEDLE HYPO 25GX1X1/2 BEV (NEEDLE) ×3 IMPLANT
NS IRRIG 1000ML POUR BTL (IV SOLUTION) ×3 IMPLANT
PACK SURGICAL SETUP 50X90 (CUSTOM PROCEDURE TRAY) ×3 IMPLANT
PAD ARMBOARD 7.5X6 YLW CONV (MISCELLANEOUS) ×3 IMPLANT
PENCIL BUTTON HOLSTER BLD 10FT (ELECTRODE) ×3 IMPLANT
SET INTRODUCER 12FR PACEMAKER (SHEATH) IMPLANT
SET SHEATH INTRODUCER 10FR (MISCELLANEOUS) IMPLANT
SHEATH COOK PEEL AWAY SET 9F (SHEATH) IMPLANT
SPONGE GAUZE 4X4 12PLY STER LF (GAUZE/BANDAGES/DRESSINGS) ×3 IMPLANT
SUT MNCRL AB 3-0 PS2 18 (SUTURE) ×3 IMPLANT
SUT PROLENE 2 0 SH 30 (SUTURE) ×6 IMPLANT
SUT SILK 2 0 TIES 10X30 (SUTURE) ×3 IMPLANT
SUT SILK 3 0 SH 30 (SUTURE) ×3 IMPLANT
SUT VIC AB 3-0 SH 27 (SUTURE) ×2
SUT VIC AB 3-0 SH 27X BRD (SUTURE) ×1 IMPLANT
SYR 20ML ECCENTRIC (SYRINGE) ×6 IMPLANT
SYR BULB 3OZ (MISCELLANEOUS) ×3 IMPLANT
SYR CONTROL 10ML LL (SYRINGE) ×3 IMPLANT
TAPE CLOTH SURG 6X10 WHT LF (GAUZE/BANDAGES/DRESSINGS) ×3 IMPLANT
TOWEL OR 17X24 6PK STRL BLUE (TOWEL DISPOSABLE) ×3 IMPLANT
TOWEL OR 17X26 10 PK STRL BLUE (TOWEL DISPOSABLE) ×3 IMPLANT
TUBE CONNECTING 12'X1/4 (SUCTIONS)
TUBE CONNECTING 12X1/4 (SUCTIONS) IMPLANT
YANKAUER SUCT BULB TIP NO VENT (SUCTIONS) IMPLANT

## 2014-01-02 NOTE — Progress Notes (Signed)
Pt is passing gas and stool.  Abdomen is less distended.   KUB shows expansion of the colonic stent.  OK to advance diet to low fiber.  F/u plans per oncology.  Signing off.

## 2014-01-02 NOTE — Progress Notes (Signed)
[facility] PROGRESS NOTE  [name] WUJ:[id number] DOB: Dec 07, 1955 DOA: 12/27/2013 PCP: No primary care provider on file.  Interim summary 58 y/o male with PMH hx significant for HTN, HLD, hx of CVA, aortic stenosis and repair of aortic aneurysm at [facility]; presented with complaints of dizziness. Patient also endorses unintentional weight loss (>30 pounds) and some abd discomfort. A CT abdomen/pelvis was done and demonstrated a sigmoid mass with high suspicious for colon cancer with liver metastasis. from dizziness work up, the patient was found to have had several CVAs, likely embolic in nature (potentially related with hypercoagulable state). GI was consulted and a colonoscopy was obtained for tissue sample and reveled almost complete obstruction of colon. Oncology has been contacted to help with staging and they are anticipating chemotherapy treatment. Surgery is on board for diverting colostomy and placement of Port A Cath. Patient doesn't not present any neurologic or motor deficit from new stroke.   Assessment/Plan:  1-dizziness and lightheadedness: secondary to acute/subacute punctate cortical/subcorticals stroke -patient with risk factors including HTN, HLD, small vessel disease and previous strokes. -also concerns of potential hypercoagulable state from malignancy; I think he has both; but given whole picture will favor full dose ASA and risk factor modifications. -per neurology rec's treat with lovenox for now and switch to full dose ASA when tolerating PO's -was on ASA, but not recommended by oncology given need of biopsies and invasives procedures coming. -no major deficit or neurologic issues appreciated -will continue using meclizine PRN for dizziness   2-abd mass/almost complete colon obstruction: most likely colon cancer with liver and potentially lungs metastasis -colonoscopy done 11/22 showed low and Javis grade dysplasia but no frank malignancy -He is having bloody stools  necessitationg re-check in Hb in am -oncology on board helping with staging; will need port A cath and chemotherapy -CT chest suggesting metastatic nodularity in his lungs -will arrange port a cath placement by surgery -patient is status post sigmoidoscopy with metal stenting to help with narrowing give risk for surgery and severe hepatomegaly--he isn't a surgical candidate -Oncology wishes to transfer patient once Port-o-cath is placed for consideration of FOLFOX therapy    3-HTN: stable to mildly elevated. -continue home regimen plus use of BIDIL TID for better control  4-HLD: not at goal -LDL elevated; statins on hold due to transaminatis and metastatic liver disease -advise to follow low fat diet  5-depression and insomnia: due to newly diagnose malignancy -will continue celexa and use trazodone  6-severe protein calorie malnutrition: continue feeding supplementation -follow dietitian rec's  7.  Hypokalemia Replacing today    Code Status: Full Family Communication: no family at bedside  Disposition Plan: to be determine    Consultants:  GI  Neurology  Oncology   General surgery   Procedures:  See below for x-ray reports  Colonoscopy (done on 11/22; demonstrated almost complete obstruction of his colon; tissue sample taken)  Sigmoidoscopy with metal stent placement on 01/01/14  Antibiotics:  None   HPI/Subjective:   Doing fair, no apparent distress Seems to understand to some extent what is going on Frustrated being on clear liquids loos  Bloody stools, not formed  Objective: Filed Vitals:   01/02/14 0550  BP: 152/82  Pulse:   Temp:   Resp:     Intake/Output Summary (Last 24 hours) at 01/02/14 0845 Last data filed at 01/02/14 0500  Gross per 24 hour  Intake 536.67 ml  Output    400 ml  Net 136.67 ml   [employer]  12/27/13 2125 12/28/13 0302  Weight: 72.576 kg (160 lb) 72 kg (158 lb 11.7 oz)    Exam:   General:  AAOX3,  afebrile, no CP or SOB; patient denies nausea or vomiting. His mood is down and continue having trouble with insomnia.  Cardiovascular: positive SEM, no rubs or gallops  Respiratory: good air movement, no wheezing, no crackles  Abdomen: distended, with palpable mass mid/left quadrant; also hepatomegaly appreciated on exam. Positive BS Data Reviewed: Basic Metabolic Panel:  Recent Labs Lab 12/27/13 2127 12/28/13 0334 01/01/14 0543  NA 135* 137 137  K 3.7 3.2* 2.9*  CL 93* 97 99  CO2 21 23 24   GLUCOSE 104* 93 86  BUN 24* 23 8  CREATININE 1.56* 1.28 0.93  CALCIUM 9.0 8.5 8.7   Liver Function Tests:  Recent Labs Lab 12/27/13 2127 12/28/13 0334  AST 73* 69*  ALT 21 19  ALKPHOS 330* 279*  BILITOT 0.6 0.5  PROT 7.9 6.9  ALBUMIN 2.7* 2.4*   CBC:  Recent Labs Lab 12/27/13 2127 12/28/13 0334 01/01/14 0543  WBC 15.1* 13.5* 14.0*  NEUTROABS 12.3* 9.5*  --   HGB 10.9* 9.8* 10.2*  HCT 32.3* 29.3* 31.0*  MCV 77.1* 79.6 79.1  PLT 353 289 321   CBG:  Recent Labs Lab 12/31/13 2122 01/01/14 1102 01/01/14 1748 01/01/14 2137 01/02/14 0801  GLUCAP 107* 78 91 91 95    Recent Results (from the past 240 hour(s))  Culture, blood (routine x 2)     Status: None (Preliminary result)   Collection Time: 12/28/13  3:34 AM  Result Value Ref Range Status   Specimen Description BLOOD RIGHT ARM  Final   Special Requests BOTTLES DRAWN AEROBIC AND ANAEROBIC 10 CC  Final   Culture  Setup Time   Final    12/28/2013 09:26 Performed at [facility]    Culture   Final           BLOOD CULTURE RECEIVED NO GROWTH TO DATE CULTURE WILL BE HELD FOR 5 DAYS BEFORE ISSUING A FINAL NEGATIVE REPORT Performed at [facility]    Report Status PENDING  Incomplete  Culture, blood (routine x 2)     Status: None (Preliminary result)   Collection Time: 12/28/13  3:46 AM  Result Value Ref Range Status   Specimen Description BLOOD LEFT FOREARM  Final   Special Requests BOTTLES  DRAWN AEROBIC AND ANAEROBIC 10 CC  Final   Culture  Setup Time   Final    12/28/2013 09:26 Performed at [facility]    Culture   Final           BLOOD CULTURE RECEIVED NO GROWTH TO DATE CULTURE WILL BE HELD FOR 5 DAYS BEFORE ISSUING A FINAL NEGATIVE REPORT Performed at [facility]    Report Status PENDING  Incomplete     Studies: Ct Chest W Contrast  12/31/2013   CLINICAL DATA:  Colon cancer with liver metastasis.  EXAM: CT CHEST WITH CONTRAST  TECHNIQUE: Multidetector CT imaging of the chest was performed during intravenous contrast administration.  CONTRAST:  55mL OMNIPAQUE IOHEXOL 300 MG/ML  SOLN  COMPARISON:  Plain films of 12/27/2013. Abdominal pelvic CT of 12/28/2013. Chest CT of 07/20/2010.  FINDINGS: Lungs/Pleura: Calcified granuloma at the right lung base. Right lower lobe subpleural 5 mm nodule on image 31 of series 205. New.  Subpleural left upper lobe 5 mm nodule on image 21 is not identified on the prior exam.  Mild motion  degradation.  A left lower lobe 3 mm nodule on image 29 is not readily apparent on the prior exam.  No pleural fluid.  Heart/Mediastinum: No supraclavicular adenopathy. Right anterior chest wall collaterals likely relate to central venous insufficiency at the right brachiocephalic vein. 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 No acute complication identified.  Mild cardiomegaly with left ventricular wall thickening. Prior median sternotomy. Multivessel coronary artery atherosclerosis. No central pulmonary embolism, on this non-dedicated study. No mediastinal or hilar adenopathy.  Upper Abdomen: Innumerable liver masses, better evaluated on dedicated abdominal study. Upper abdominal aortic atherosclerosis. Perisplenic and perihepatic ascites. A suspicion of cirrhosis.  Right greater than left gynecomastia.  Bones/Musculoskeletal:  No acute osseous abnormality.  IMPRESSION: 1. Nonspecific small pulmonary nodules. These are likely  new since the remote exam of 07/20/2010. Early pulmonary metastasis cannot be excluded. 2. Status post right sided aortic stent graft repair, without acute complication. 3. Cardiomegaly and left ventricular hypertrophy. 4. Multiple liver masses with suggestion of cirrhosis. This could represent metastatic disease and/or hepatocellular carcinoma/carcinomas. 5. Upper abdominal ascites. 6. Gynecomastia.   Electronically Signed   By: Abigail Miyamoto M.D.   On: 12/31/2013 10:36   Dg Abd 2 Views  01/01/2014   CLINICAL DATA:  Colonic mass.  EXAM: DG C-ARM 61-120 MIN; ABDOMEN - 2 VIEW  COMPARISON:  CT scan of December 28, 2013.  FINDINGS: Ten fluoroscopic images were obtained during stent placement through colonic mass via colonoscopy.  IMPRESSION: Stent placement for treatment of colonic mass.   Electronically Signed   By: Sabino Dick M.D.   On: 01/01/2014 11:19   Dg C-arm 61-120 Min  01/01/2014   CLINICAL DATA:  Colonic mass.  EXAM: DG C-ARM 61-120 MIN; ABDOMEN - 2 VIEW  COMPARISON:  CT scan of December 28, 2013.  FINDINGS: Ten fluoroscopic images were obtained during stent placement through colonic mass via colonoscopy.  IMPRESSION: Stent placement for treatment of colonic mass.   Electronically Signed   By: Sabino Dick M.D.   On: 01/01/2014 11:19    Scheduled Meds: . citalopram  10 mg Oral Daily  . enoxaparin (LOVENOX) injection  40 mg Subcutaneous Q24H  . feeding supplement (ENSURE COMPLETE)  237 mL Oral TID BM  . isosorbide-hydrALAZINE  1 tablet Oral TID  . metoprolol succinate  50 mg Oral Daily  . nicotine  7 mg Transdermal Daily  . pantoprazole  40 mg Oral Daily   Continuous Infusions: . sodium chloride 0.9 % 1,000 mL with potassium chloride 40 mEq infusion 50 mL/hr at 01/02/14 3875    Principal Problem:   AKI (acute kidney injury) Active Problems:   Lactic acidosis   Cerebral embolism with cerebral infarction   Abnormal LFTs   Leucocytosis   Dizziness and giddiness   Abdominal mass    Loss of weight   Acute kidney injury   Hepatomegalia   HLD (hyperlipidemia)   Malignant neoplasm of colon   Intestinal obstruction   Colon cancer metastasized to liver   Protein-calorie malnutrition, severe   Benign essential HTN   Colonic obstruction    Time spent: 30 minutes     [place] Pager [phone]. If 7PM-7AM, please contact night-coverage at [email or web address], password [facility] 01/02/2014, 8:45 AM  LOS: 6 days

## 2014-01-02 NOTE — Plan of Care (Signed)
Problem: Acute Treatment Outcomes Goal: BP within ordered parameters Outcome: Adequate for Discharge Pt is to discharge to [facility] for chemotherapy. BP will be monitored. Pt meds have been adjusted per MD.  Goal: Hemodynamically stable Outcome: Adequate for Discharge Transfer to [facility] for chemo Goal: Other Acute Treatment Outcomes Outcome: Not Applicable Date Met:  63/14/97  Problem: Progression Outcomes Goal: Rehab Team goals identified Outcome: Completed/Met Date Met:  01/02/14

## 2014-01-02 NOTE — Anesthesia Procedure Notes (Signed)
Procedure Name: LMA Insertion Date/Time: 01/02/2014 1:28 PM Performed by: Manuela Schwartz B Pre-anesthesia Checklist: Patient identified, Emergency Drugs available, Suction available, Patient being monitored and Timeout performed Patient Re-evaluated:Patient Re-evaluated prior to inductionOxygen Delivery Method: Circle system utilized Preoxygenation: Pre-oxygenation with 100% oxygen Intubation Type: IV induction LMA: LMA inserted LMA Size: 4.0 Number of attempts: 1 Placement Confirmation: positive ETCO2 and breath sounds checked- equal and bilateral Tube secured with: Tape Dental Injury: Teeth and Oropharynx as per pre-operative assessment

## 2014-01-02 NOTE — Transfer of Care (Signed)
Immediate Anesthesia Transfer of Care Note  Patient: [name]  Procedure(s) Performed: Procedure(s): INSERTION PORT-A-CATH (N/A)  Patient Location: PACU  Anesthesia Type:General  Level of Consciousness: responds to stimulation  Airway & Oxygen Therapy: Patient Spontanous Breathing  Post-op Assessment: Report given to PACU RN and Post -op Vital signs reviewed and stable  Post vital signs: Reviewed and stable  Complications: No apparent anesthesia complications

## 2014-01-02 NOTE — Progress Notes (Signed)
          Daily Rounding Note  01/02/2014, 9:09 AM  LOS: 6 days   SUBJECTIVE:       Large volume stools yesterday after stent placed.  None yet today.  Mostly npo and off floor today for porta cath placement.  Belly still feels distended, but not painful.  No nausea,  He is hungry and wants to try solid food.   OBJECTIVE:         Vital signs in last 24 hours:    Temp:  [97.4 F (36.3 C)-100.9 F (38.3 C)] 99.2 F (37.3 C) (11/25 0540) Pulse Rate:  [70-85] 78 (11/25 0540) Resp:  [16-30] 18 (11/25 0540) BP: (124-173)/(82-123) 152/82 mmHg (11/25 0550) SpO2:  [98 %-100 %] 98 % (11/25 0540) Last BM Date: 01/02/14 [facility] Weights   12/27/13 2125 12/28/13 0302  Weight: 160 lb (72.576 kg) 158 lb 11.7 oz (72 kg)   General: pleasant, comfortable   Heart: RRR Chest: clear bil.   Abdomen: soft, distended, hypoactive BS.   Not tender Extremities: no CCE Neuro/Psych:  Oriented x 3,  In good spirits.   Intake/Output from previous day: 19-Jan-2023 0701 - 11/25 0700 In: 536.7 [P.O.:240; I.V.:296.7] Out: 400 [Urine:400]  Intake/Output this shift:    Lab Results:  Recent Labs  01/18/2014 0543  WBC 14.0*  HGB 10.2*  HCT 31.0*  PLT 321   BMET  Recent Labs  2014/01/18 0543  NA 137  K 2.9*  CL 99  CO2 24  GLUCOSE 86  BUN 8  CREATININE 0.93  CALCIUM 8.7   LFT No results for input(s): PROT, ALBUMIN, AST, ALT, ALKPHOS, BILITOT, BILIDIR, IBILI in the last 72 hours. PT/INR No results for input(s): LABPROT, INR in the last 72 hours. Hepatitis Panel No results for input(s): HEPBSAG, HCVAB, HEPAIGM, HEPBIGM in the last 72 hours.  Studies/Results: Dg Abd 2 Views  01-18-2014   CLINICAL DATA:  Colonic mass.  EXAM: DG C-ARM 61-120 MIN; ABDOMEN - 2 VIEW  COMPARISON:  CT scan of December 28, 2013.  FINDINGS: Ten fluoroscopic images were obtained during stent placement through colonic mass via colonoscopy.  IMPRESSION: Stent placement for  treatment of colonic mass.   Electronically Signed   By: Sabino Dick M.D.   On: 01-18-14 11:19   Dg C-arm 61-120 Min  01-18-2014   CLINICAL DATA:  Colonic mass.  EXAM: DG C-ARM 61-120 MIN; ABDOMEN - 2 VIEW  COMPARISON:  CT scan of December 28, 2013.  FINDINGS: Ten fluoroscopic images were obtained during stent placement through colonic mass via colonoscopy.  IMPRESSION: Stent placement for treatment of colonic mass.   Electronically Signed   By: Sabino Dick M.D.   On: 01-18-2014 11:19    ASSESMENT:   *  Obstructing rectosigmoid cancer. S/p January 19, 2023 flexible sigmoidoscopy with balloon dilatation and stent placement for relief of obstructing lesion  *  Sigmoid colon mass, pathology shows high-grade adenomatous dysplasia.  CEA level is elevated. CT scan shows liver masses, pulmonary nodules all likely due to metastatic disease.  Will possibly need biopsy of the liver in order to determine genetics of the cancer.  Dr. Lutricia Feil has recommended initiation of chemotherapy withFOLFOX. Patient is to have a port placed today.   PLAN   *  Allow solid food.  Regress diet and check kub if pt does not tolerate po or if po causes abdominal pain or distress.     [name]  01/02/2014, 9:09 AM Pager: [phone]  .exrk

## 2014-01-02 NOTE — Anesthesia Postprocedure Evaluation (Signed)
  Anesthesia Post-op Note  Patient: [name]  Procedure(s) Performed: Procedure(s): INSERTION PORT-A-CATH (N/A)  Patient Location: PACU  Anesthesia Type:General  Level of Consciousness: awake  Airway and Oxygen Therapy: Patient Spontanous Breathing  Post-op Pain: none  Post-op Assessment: Post-op Vital signs reviewed, Patient's Cardiovascular Status Stable, Respiratory Function Stable, Patent Airway, No signs of Nausea or vomiting and Pain level controlled  Post-op Vital Signs: Reviewed and stable  Last Vitals:  Filed Vitals:   01/02/14 1607  BP: 129/95  Pulse: 80  Temp: 36.6 C  Resp: 20    Complications: No apparent anesthesia complications

## 2014-01-02 NOTE — Op Note (Signed)
01/02/2014  2:37 PM  PATIENT:  [name]  58 y.o. male  PRE-OPERATIVE DIAGNOSIS:  STAGE IV COLON CANCER  POST-OPERATIVE DIAGNOSIS:  STAGE IV COLON CANCER  PROCEDURE:  Procedure(s): INSERTION PORT-A-CATH (N/A)  SURGEON:  Surgeon(s) and Role:    * Ralene Ok, MD - Primary   ASSISTANTS: none   ANESTHESIA:   local and general  EBL:  Total I/O In: 175.8 [I.V.:175.8] Out: -   BLOOD ADMINISTERED:none  DRAINS: none   LOCAL MEDICATIONS USED:  BUPIVICAINE   SPECIMEN:  No Specimen  DISPOSITION OF SPECIMEN:  N/A  COUNTS:  YES  TOURNIQUET:  * No tourniquets in log *  DICTATION: .Dragon Dictation  Findings:  Patient 8 French MRI-compatible power port placed in the left infraclavicular area via cannulation of the L Jugular vei. The tip lay at the junction of the SVC and atrial junction.  The port aspirated and flushed easily.  Indications for procedure:  The patient is a 58 y/o M with stg IV colon CA.  Details of the procedure:  The patient was taken back to the operating room. The patient was placed in supine position with bilateral SCDs in place.  The patient was prepped and draped in the usual sterile fashion. After appropriate anitbiotics were confirmed, a time-out was confirmed and all facts were verified.  A 3 cm incision was made in the left deltopectoral groove. Bovie cautery was used to maintain hemostasis and dissection was taken down just superficial to the pectoralis fashion. Blunt dissection was used to create a pocket. The port was placed in this area check for a good fit. At this time a Seldinger technique was used to cannulize the subclavian vein. There was difficulty with confirming the placement of the wire secondary to an aortic stent graft. This subclavian technique was abandoned. I proceeded to place a finder needle catheter into the left jugular under ultrasound guidance. This time the wire was advanced. Again fluoroscopy was used to confirm placement of  the wire. This was then seen to be in the SVC atrial junction. At this time the dilator was placed over the wire. The wire and the dilator were then removed. The catheter was in placed into the introducer. This was again confirmed by fluoroscopy to be at the [facility] atrial junction. At this time a tunneler was used from the subcutaneous pocket toward the insertion site of the catheter. This was brought out towards the pocket. The catheter was cut to length and the port was then fastened to the catheter with a fastener in place. At this time the port was then secured to the subcutaneous tissues pocket using 2-0 Prolene 3. At this time a Charisse March needle was used to access the port. This aspirated and flushed easily. Again fluoroscopy was used to confirm position and curvature of the catheter. The catheter lay at [facility] atrial junction. At this time the deep dermal layer was reapproximated with a 2-0 Vicryl interrupted fashion. The skin was reapproximated using 4-0 Monocryl subcutaneous fashion. Skin was dressed with Dermabond. The catheter was left accessed secondary to beginning chemotherapy. The patient tolerated the procedure well was taken to the recovery room in stable condition. Chest x-ray is pending.  PLAN OF CARE: Admit to inpatient   PATIENT DISPOSITION:  PACU - hemodynamically stable.   Delay start of Pharmacological VTE agent (>24hrs) due to surgical blood loss or risk of bleeding: not applicable

## 2014-01-02 NOTE — Progress Notes (Signed)
Physical Therapy Treatment Patient Details Name: [name] MRN: [id number] DOB: 1955/06/30 Today's Date: 01/02/2014    History of Present Illness  [name] is a 58 y.o. male with Past medical history of hypertension, BPH.  Presented with dizziness recent falls x 2, and 25+# weight loss in 2 months.    PT Comments    Pt finally agreed to mobilize after a pretty hard sell, but ambulated well with general steadiness and guarding.  Too fatigued for much else.  Follow Up Recommendations  Home health PT     Equipment Recommendations       Recommendations for Other Services       Precautions / Restrictions Precautions Precautions: Fall    Mobility  Bed Mobility Overal bed mobility: Modified Independent                Transfers Overall transfer level: Needs assistance Equipment used: None Transfers: Sit to/from Stand Sit to Stand: Supervision            Ambulation/Gait Ambulation/Gait assistance: Supervision Ambulation Distance (Feet): 150 Feet Assistive device: None Gait Pattern/deviations: Step-through pattern Gait velocity: slower   General Gait Details: generally steady, but guarded.   Stairs            Wheelchair Mobility    Modified Rankin (Stroke Patients Only)       Balance     Sitting balance-Leahy Scale: Good                              Cognition Arousal/Alertness: Awake/alert Behavior During Therapy: WFL for tasks assessed/performed Overall Cognitive Status: Within Functional Limits for tasks assessed                      Exercises      General Comments        Pertinent Vitals/Pain Pain Assessment: Faces Faces Pain Scale: Hurts even more Pain Location: L porta=-cath site Pain Descriptors / Indicators: Grimacing Pain Intervention(s): Limited activity within patient's tolerance    Home Living                      Prior Function            PT Goals (current goals can now be found  in the care plan section) Acute Rehab PT Goals PT Goal Formulation: With patient Time For Goal Achievement: 01/11/14 Potential to Achieve Goals: Good Progress towards PT goals: Progressing toward goals    Frequency  Min 3X/week    PT Plan      Co-evaluation             End of Session   Activity Tolerance: Patient limited by fatigue;Patient tolerated treatment well Patient left: in bed;with call bell/phone within reach     Time: 1658-1710 PT Time Calculation (min) (ACUTE ONLY): 12 min  Charges:  $Gait Training: 8-22 mins                    G Codes:      [name], [name] 01/02/2014, 5:15 PM 01/02/2014  [name], [name] [phone]  (pager)

## 2014-01-02 NOTE — Progress Notes (Signed)
OT Cancellation Note  Patient Details Name: Sidi Dzikowski MRN: [id number] DOB: [birth year]   Cancelled Treatment:    Reason Eval/Treat Not Completed: Patient at procedure or test/ unavailable  Benito Mccreedy OTR/L [phone] 01/02/2014, 12:21 PM

## 2014-01-02 NOTE — Anesthesia Preprocedure Evaluation (Addendum)
Anesthesia Evaluation  Patient identified by MRN, date of birth, ID band Patient awake    Reviewed: Allergy & Precautions, H&P , NPO status , Patient's Chart, lab work & pertinent test results, Unable to perform ROS - Chart review only  History of Anesthesia Complications Negative for: history of anesthetic complications  Airway Mallampati: II  TM Distance: >3 FB Neck ROM: Full    Dental  (+) Dental Advisory Given, Partial Upper   Pulmonary neg pulmonary ROS,  breath sounds clear to auscultation        Cardiovascular hypertension, Pt. on home beta blockers and Pt. on medications + CAD, + Cardiac Stents, + CABG and + Peripheral Vascular Disease Rhythm:Regular     Neuro/Psych TIACVA, No Residual Symptoms negative neurological ROS     GI/Hepatic negative GI ROS, Neg liver ROS,   Endo/Other  negative endocrine ROS  Renal/GU Renal InsufficiencyRenal disease     Musculoskeletal negative musculoskeletal ROS (+)   Abdominal (+)  Abdomen: soft. Bowel sounds: normal.  Peds  Hematology negative hematology ROS (+)   Anesthesia Other Findings ECHO from 12/28/13.  EF 65-70%.  Aortic valve:  Trileaflet; normal thickness, mildly calcified leaflets. Mobility was not restricted. Doppler: Transvalvular velocity was within the normal range. There was no stenosis. There was no regurgitation.   Reproductive/Obstetrics negative OB ROS                        Anesthesia Physical Anesthesia Plan  ASA: III  Anesthesia Plan: General   Post-op Pain Management:    Induction: Intravenous  Airway Management Planned: Oral ETT and LMA  Additional Equipment: None  Intra-op Plan:   Post-operative Plan: Extubation in OR  Informed Consent: I have reviewed the patients History and Physical, chart, labs and discussed the procedure including the risks, benefits and alternatives for the proposed anesthesia with the  patient or authorized representative who has indicated his/her understanding and acceptance.   Dental advisory given  Plan Discussed with: CRNA and Surgeon  Anesthesia Plan Comments:         Anesthesia Quick Evaluation

## 2014-01-02 NOTE — Plan of Care (Signed)
Problem: Progression Outcomes Goal: Rehab Team goals identified Outcome: Adequate for Discharge  Problem: Discharge/Transitional Outcomes Goal: Barriers To Progression Addressed/Resolved Outcome: Completed/Met Date Met:  01/02/14 Goal: Educational Plan Complete Outcome: Completed/Met Date Met:  01/02/14 Goal: Hemodynamically stable Outcome: Completed/Met Date Met:  01/02/14 Goal: If vent dependent, trach in place Outcome: Not Applicable Date Met:  26/58/71 Goal: INR monitor plan established Outcome: Not Applicable Date Met:  84/10/85 Goal: Family and patient agree upon discharge plan Outcome: Completed/Met Date Met:  01/02/14 Goal: PCP appointment made and transportation plan in place Outcome: Adequate for Discharge Pt to transfer to [place]  Goal: Ability to attain medications upon leaving hospital Outcome: Completed/Met Date Met:  01/02/14 Goal: Other Discharge Outcomes/Goals Outcome: Not Applicable Date Met:  79/07/93

## 2014-01-02 NOTE — Progress Notes (Signed)
Mr. Wyndham did have a stent placed through the obstructed area in the sigmoid colon. Because of this, his need for surgery is not emergent.  He is to go for a Port-A-Cath today.  Once he Port-A-Cath is placed, he needs to be transferred over to was a long hospital so that he can have chemotherapy.  I feel confident that he will respond to chemotherapy. He has significant hepatomegaly that we can easily follow for response. He also has an elevated CEA level that we can use to determine response.  He has had no problems with nausea or vomiting.  His been no issues with dizziness.  His hemoglobin is holding pretty stable. His potassium is on the low side. It is only 2.9. This can be replaced orally. He is on oral potassium. He may need some potassium runs once his Port-A-Cath is placed.  He has had no fever. His temperature is 99.2. His blood pressure is 152/82. On his exam, his lungs are clear. Cardiac exam is regular rate and rhythm. He has no murmurs. Abdomen is distended because of hepatomegaly. His liver extends across the midline. Bowel sounds are slightly decreased. Extremities shows no clubbing, cyanosis or edema.  Again, he will need to be transferred over to was still on hospital. Was there, we do chemotherapy. I would use FOLFOX. Again, I think we can get a good response with FOLFOX. He will not lose his hair. This will be a 2 day regimen. If all goes well, then he can go home afterwards.  I appreciate everybody's help in trying to make his life better. I will continue to pray about this. He does have a strong faith.  I hope the staff on 5 W. as a great Thanksgiving!!!! I am a full for all other great care to the patients who are on their floor!! Pete E. 1 Thessalonians 5:16-18

## 2014-01-02 NOTE — Progress Notes (Signed)
Report given to [facility] 3W charge nurse. All questions were answered and unit number provided. [facility] called.

## 2014-01-03 LAB — GLUCOSE, CAPILLARY
GLUCOSE-CAPILLARY: 121 mg/dL — AB (ref 70–99)
Glucose-Capillary: 101 mg/dL — ABNORMAL HIGH (ref 70–99)
Glucose-Capillary: 103 mg/dL — ABNORMAL HIGH (ref 70–99)
Glucose-Capillary: 116 mg/dL — ABNORMAL HIGH (ref 70–99)
Glucose-Capillary: 99 mg/dL (ref 70–99)

## 2014-01-03 LAB — CULTURE, BLOOD (ROUTINE X 2)
CULTURE: NO GROWTH
Culture: NO GROWTH

## 2014-01-03 MED ORDER — IBUPROFEN 400 MG PO TABS
400.0000 mg | ORAL_TABLET | Freq: Once | ORAL | Status: AC
  Administered 2014-01-03: 400 mg via ORAL
  Filled 2014-01-03: qty 1

## 2014-01-03 MED ORDER — PRAVASTATIN SODIUM 20 MG PO TABS
20.0000 mg | ORAL_TABLET | Freq: Every day | ORAL | Status: DC
  Administered 2014-01-04 – 2014-01-13 (×10): 20 mg via ORAL
  Filled 2014-01-03 (×11): qty 1

## 2014-01-03 MED ORDER — POTASSIUM CHLORIDE IN NACL 40-0.9 MEQ/L-% IV SOLN
INTRAVENOUS | Status: DC
  Administered 2014-01-03 – 2014-01-08 (×4): 50 mL/h via INTRAVENOUS
  Filled 2014-01-03 (×8): qty 1000

## 2014-01-03 NOTE — Progress Notes (Addendum)
Patient ID: [name], male   DOB: [birth year], 58 y.o.   MRN: [id number] [facility] PROGRESS NOTE  [name] KWI:[id number] DOB: 12/01/1955 DOA: 12/27/2013 PCP: No primary care provider on file.  Brief narrative:    58 year old male with past medical history of hypertension, dyslipidemia, history of CVA, aortic stenosis and repair of aortic aneurysm at [facility] who resented to [facility] ED with complaints of dizziness, lightheadedness, unintentional weight loss of more than 30 pounds, abdominal discomfort. A CT abdomen/pelvis was done and demonstrated a sigmoid mass with high suspicious for colon cancer with liver metastasis GI was consulted and a colonoscopy was obtained for tissue sample and reveled almost complete obstruction of colon. Oncology has been contacted to help with staging and they are anticipating chemotherapy treatment. Surgery has placed stent for colonic mass and has placed port-a-cath. Hospital course further complicated with new acute and subacute cortical and subcortical CVA.  Assessment/Plan:    Principal Problem: Dizziness and lightheadedness / acute and subacute punctate cortical and subcortical stroke - as seen on MRI brain . Pt with risk factors including hypertension, dyslipidemia and evidence of prior strokes on MRI brain - obvious concern for hypercoagulable state considering malignancy but full dose aspirin favored over anticoagulation due to risk factors. 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 Aspirin however not ordered due to need for biopsies and invasive procedures coming. Since patient already had stent placed and PAC placed will see with oncology if pt can be started on aspirin.  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  Family Communication:  plan of care discussed with the patient Disposition Plan: Home when stable.   IV access:   PAC  Procedures and diagnostic studies:    Ct Chest W Contrast 12/31/2013   1. Nonspecific small pulmonary nodules. These are likely new since the remote exam of 07/20/2010. Early pulmonary metastasis cannot be excluded. 2. Status post right sided aortic stent graft repair, without acute complication. 3. Cardiomegaly and left ventricular hypertrophy. 4. Multiple liver masses with suggestion of cirrhosis. This could represent metastatic disease and/or hepatocellular carcinoma/carcinomas. 5. Upper abdominal ascites. 6. Gynecomastia.     Dg Chest Port 1 View 01/02/2014   No pneumothorax status post left internal jugular Port-A-Cath placement. Distal tip is seen overlying expected position  of cavoatrial junction.     Dg Abd 2 Views 01/02/2014   1. There is no bowel obstruction demonstrated. A partially collapsed sigmoid colonic stent graft is present within the true bony pelvis. 2. Gallstones     Dg Abd 2 Views 01/01/2014  Stent placement for treatment of colonic mass.     Dg C-arm 61-120 Min 01/01/2014 Stent placement for treatment of colonic mass.     Medical Consultants:   GI  Neurology  Oncology   General surgery  Other Consultants:   Physical therapy - home health PT  Nutrition  IAnti-Infectives:    None    Cody Lenz, MD  [facility] Pager [phone]  If 7PM-7AM, please contact night-coverage [email or web address] Password [facility] 01/03/2014, 6:35 PM   LOS: 7 days    HPI/Subjective: No acute overnight events.  Objective: Filed Vitals:   01/02/14 1607 01/02/14 2029 01/03/14 0610 01/03/14 1343  BP: 129/95 147/98 121/87 146/99  Pulse: 80 70 85 81  Temp: 97.8 F (36.6 C) 98.5 F (36.9 C) 98.6 F (37 C) 98.4 F (36.9 C)  TempSrc: Oral Oral Oral Oral  Resp: 20 18 20 20   Height:  5\' 10"  (1.778 m)    Weight:  70.5 kg (155 lb 6.8 oz)    SpO2: 100% 100% 100% 99%    Intake/Output Summary (Last 24 hours) at 01/03/14 1835 Last data filed at 01/03/14 1606  Gross per 24 hour  Intake 1150.83 ml  Output    175 ml  Net 975.83 ml    Exam:   General:  Pt is alert, follows commands appropriately, not in acute distress  Cardiovascular: Regular rate and rhythm, S1/S2, SEM appreciated   Respiratory: Clear to auscultation bilaterally, no wheezing, no crackles, no rhonchi  Abdomen: Soft, non tender, non distended, bowel sounds present  Extremities: No edema, pulses DP and PT palpable bilaterally  Neuro: Grossly nonfocal  Data Reviewed: Basic Metabolic Panel:  Recent Labs Lab 12/27/13 2127 12/28/13 0334 01/01/14 0543  NA 135* 137 137  K 3.7 3.2* 2.9*  CL 93* 97 99  CO2 21 23 24   GLUCOSE 104* 93 86  BUN 24* 23 8  CREATININE 1.56* 1.28 0.93  CALCIUM 9.0 8.5 8.7   Liver Function  Tests:  Recent Labs Lab 12/27/13 2127 12/28/13 0334  AST 73* 69*  ALT 21 19  ALKPHOS 330* 279*  BILITOT 0.6 0.5  PROT 7.9 6.9  ALBUMIN 2.7* 2.4*   No results for input(s): LIPASE, AMYLASE in the last 168 hours. No results for input(s): AMMONIA in the last 168 hours. CBC:  Recent Labs Lab 12/27/13 2127 12/28/13 0334 01/01/14 0543  WBC 15.1* 13.5* 14.0*  NEUTROABS 12.3* 9.5*  --   HGB 10.9* 9.8* 10.2*  HCT 32.3* 29.3* 31.0*  MCV 77.1* 79.6 79.1  PLT 353 289 321   Cardiac Enzymes: No results for input(s): CKTOTAL, CKMB, CKMBINDEX, TROPONINI in the last 168 hours. BNP: Invalid input(s): POCBNP CBG:  Recent Labs Lab 01/02/14 1604 01/03/14 0020 01/03/14 0708 01/03/14 1143 01/03/14 1706  GLUCAP 123* 116* 103* 99 101*    Culture, blood (routine x 2)     Status: None   Collection Time: 12/28/13  3:34 AM  Result Value Ref Range Status   Specimen Description BLOOD RIGHT ARM  Final   Special Requests BOTTLES DRAWN AEROBIC AND ANAEROBIC 10 CC  Final   Culture  Setup Time   Final   Culture   Final    NO GROWTH 5 DAYS  Report Status 01/03/2014 FINAL  Final  Culture, blood (routine x 2)     Status: None   Collection Time: 12/28/13  3:46 AM  Result Value Ref Range Status   Specimen Description BLOOD LEFT FOREARM  Final   Special Requests BOTTLES DRAWN AEROBIC AND ANAEROBIC 10 CC  Final   Culture  Setup Time   Final   Culture   Final    NO GROWTH 5 DAYS   Report Status 01/03/2014 FINAL  Final  Surgical pcr screen     Status: Abnormal   Collection Time: 01/02/14 10:12 AM  Result Value Ref Range Status   MRSA, PCR NEGATIVE NEGATIVE Final   Staphylococcus aureus POSITIVE (A) NEGATIVE Final    Comment:        Scheduled Meds: . citalopram  10 mg Oral Daily  . enoxaparin (LOVENOX) injection  40 mg Subcutaneous Q24H  . feeding supplement (ENSURE COMPLETE)  237 mL Oral TID BM  . isosorbide-hydrALAZINE  1 tablet Oral TID  . metoprolol succinate  50 mg Oral Daily  .  nicotine  7 mg Transdermal Daily  . pantoprazole  40 mg Oral Daily   Continuous Infusions: . 0.9 % NaCl with KCl 40 mEq / L 50 mL/hr (01/03/14 1809)  . lactated ringers 50 mL/hr at 01/02/14 1224

## 2014-01-04 DIAGNOSIS — E785 Hyperlipidemia, unspecified: Secondary | ICD-10-CM

## 2014-01-04 DIAGNOSIS — R140 Abdominal distension (gaseous): Secondary | ICD-10-CM | POA: Diagnosis present

## 2014-01-04 LAB — COMPREHENSIVE METABOLIC PANEL
ALK PHOS: 285 U/L — AB (ref 39–117)
ALT: 17 U/L (ref 0–53)
AST: 83 U/L — AB (ref 0–37)
Albumin: 2.6 g/dL — ABNORMAL LOW (ref 3.5–5.2)
Anion gap: 14 (ref 5–15)
BILIRUBIN TOTAL: 0.4 mg/dL (ref 0.3–1.2)
BUN: 14 mg/dL (ref 6–23)
CHLORIDE: 95 meq/L — AB (ref 96–112)
CO2: 25 meq/L (ref 19–32)
CREATININE: 0.98 mg/dL (ref 0.50–1.35)
Calcium: 9.3 mg/dL (ref 8.4–10.5)
GFR calc Af Amer: >90 mL/min (ref >90)
GFR calc non Af Amer: 89 mL/min — ABNORMAL LOW (ref >90)
Glucose, Bld: 138 mg/dL — ABNORMAL HIGH (ref 70–99)
POTASSIUM: 4.4 meq/L (ref 3.7–5.3)
Sodium: 134 mEq/L — ABNORMAL LOW (ref 137–147)
Total Protein: 7.9 g/dL (ref 6.0–8.3)

## 2014-01-04 LAB — BASIC METABOLIC PANEL
Anion gap: 11 (ref 5–15)
BUN: 13 mg/dL (ref 6–23)
CALCIUM: 8.9 mg/dL (ref 8.4–10.5)
CO2: 27 meq/L (ref 19–32)
CREATININE: 1 mg/dL (ref 0.50–1.35)
Chloride: 98 mEq/L (ref 96–112)
GFR calc Af Amer: >90 mL/min (ref >90)
GFR calc non Af Amer: 81 mL/min — ABNORMAL LOW (ref >90)
GLUCOSE: 93 mg/dL (ref 70–99)
Potassium: 4.2 mEq/L (ref 3.7–5.3)
Sodium: 136 mEq/L — ABNORMAL LOW (ref 137–147)

## 2014-01-04 LAB — CBC
HEMATOCRIT: 33.6 % — AB (ref 39.0–52.0)
Hemoglobin: 11.2 g/dL — ABNORMAL LOW (ref 13.0–17.0)
MCH: 27.1 pg (ref 26.0–34.0)
MCHC: 33.3 g/dL (ref 30.0–36.0)
MCV: 81.2 fL (ref 78.0–100.0)
PLATELETS: 385 10*3/uL (ref 150–400)
RBC: 4.14 MIL/uL — AB (ref 4.22–5.81)
RDW: 19.2 % — ABNORMAL HIGH (ref 11.5–15.5)
WBC: 15.3 10*3/uL — AB (ref 4.0–10.5)

## 2014-01-04 LAB — GLUCOSE, CAPILLARY: GLUCOSE-CAPILLARY: 135 mg/dL — AB (ref 70–99)

## 2014-01-04 MED ORDER — SODIUM CHLORIDE 0.9 % IV SOLN
400.0000 mg/m2 | Freq: Once | INTRAVENOUS | Status: AC
  Administered 2014-01-04: 750 mg via INTRAVENOUS
  Filled 2014-01-04: qty 15

## 2014-01-04 MED ORDER — DEXTROSE 5 % IV SOLN
400.0000 mg/m2 | Freq: Once | INTRAVENOUS | Status: AC
  Administered 2014-01-04: 748 mg via INTRAVENOUS
  Filled 2014-01-04: qty 37.4

## 2014-01-04 MED ORDER — DEXTROSE 5 % IV SOLN
Freq: Once | INTRAVENOUS | Status: AC
  Administered 2014-01-04: 12:00:00 via INTRAVENOUS

## 2014-01-04 MED ORDER — HEPARIN SOD (PORK) LOCK FLUSH 100 UNIT/ML IV SOLN: 250.0000 [IU] | Freq: Once | INTRAVENOUS | Status: AC | PRN

## 2014-01-04 MED ORDER — SODIUM CHLORIDE 0.9 % IV SOLN
Freq: Once | INTRAVENOUS | Status: AC
  Administered 2014-01-04: 8 mg via INTRAVENOUS
  Filled 2014-01-04: qty 4

## 2014-01-04 MED ORDER — DRONABINOL 5 MG PO CAPS
5.0000 mg | ORAL_CAPSULE | Freq: Three times a day (TID) | ORAL | Status: DC
  Administered 2014-01-04 – 2014-01-14 (×32): 5 mg via ORAL
  Filled 2014-01-04 (×33): qty 1

## 2014-01-04 MED ORDER — SODIUM CHLORIDE 0.9 % IJ SOLN: 3.0000 mL | INTRAMUSCULAR | Status: DC | PRN

## 2014-01-04 MED ORDER — COLD PACK MISC ONCOLOGY
1.0000 | Freq: Once | Status: AC | PRN
  Filled 2014-01-04: qty 1

## 2014-01-04 MED ORDER — ALTEPLASE 2 MG IJ SOLR
2.0000 mg | Freq: Once | INTRAMUSCULAR | Status: AC | PRN
  Filled 2014-01-04: qty 2

## 2014-01-04 MED ORDER — HEPARIN SOD (PORK) LOCK FLUSH 100 UNIT/ML IV SOLN: 500.0000 [IU] | Freq: Once | INTRAVENOUS | Status: AC | PRN

## 2014-01-04 MED ORDER — SODIUM CHLORIDE 0.9 % IJ SOLN: 10.0000 mL | INTRAMUSCULAR | Status: DC | PRN

## 2014-01-04 MED ORDER — OXALIPLATIN CHEMO INJECTION 100 MG/20ML
85.0000 mg/m2 | Freq: Once | INTRAVENOUS | Status: AC
  Administered 2014-01-04: 160 mg via INTRAVENOUS
  Filled 2014-01-04: qty 32

## 2014-01-04 MED ORDER — ASPIRIN EC 325 MG PO TBEC
325.0000 mg | DELAYED_RELEASE_TABLET | Freq: Every day | ORAL | Status: DC
  Administered 2014-01-04 – 2014-01-14 (×11): 325 mg via ORAL
  Filled 2014-01-04 (×11): qty 1

## 2014-01-04 MED ORDER — FLUOROURACIL CHEMO INJECTION 5 GM/100ML
1200.0000 mg/m2 | INTRAVENOUS | Status: AC
  Administered 2014-01-04 – 2014-01-05 (×2): 2250 mg via INTRAVENOUS
  Filled 2014-01-04 (×2): qty 45

## 2014-01-04 MED ORDER — HOT PACK MISC ONCOLOGY
1.0000 | Freq: Once | Status: AC | PRN
  Filled 2014-01-04: qty 1

## 2014-01-04 NOTE — Progress Notes (Signed)
PT Cancellation Note  Patient Details Name: [name] MRN: [id number] DOB: 08-13-1955   Cancelled Treatment:    Reason Eval/Treat Not Completed: Patient declined, no reason specified (PT will sign off, no further needs);  Pt does  Not feel he needs any further PT for any reason; If status changes, please re-consult PT; Thank you.   [facility] 01/04/2014, 12:18 PM

## 2014-01-04 NOTE — Progress Notes (Signed)
OT Cancellation Note  Patient Details Name: [name] MRN: [id number] DOB: 02-23-1955   Cancelled Treatment:    Reason Eval/Treat Not Completed: Other (comment).  Checked with pt after he declined PT.  He does not feel he has any further OT needs. Will sign off.  Alexcia Schools 01/04/2014, 1:48 PM  Lesle Chris, OTR/L [phone] 01/04/2014

## 2014-01-04 NOTE — Progress Notes (Addendum)
NUTRITION FOLLOW UP   Intervention:   -Continue with Ensure Complete po TID, each supplement provides 350 kcal and 13 grams of protein -Provided pt with nutrition supplement coupons, and reviewed supplement alternatives for d/c needs -Explained rationale for low residue/soft diet -Continue Dronabinol TID -RD to continue to monitor  Nutrition Dx:   Inadequate oral intake related to poor appetite as evidenced by wt loss.   Goal:   Pt to meet >/= 90% of their estimated nutrition needs   Monitor:   Weight trend, po intake, acceptance of supplements, labs  Assessment:   58 y.o. male with Past medical history of hypertension, BPH. The patient is presenting with complaints of dizziness.  Pt has been advanced to a full liquid diet. Intake fair; 50% meal completion. Will order Ensure now that diet is advanced to promote nutritional adequacy.  Chart review indicates that pt is depressed about pending diagnosis. Work up suggests metastatic colon cancer. Pt is scheduled for sigmoidoscopy 11/24 AM.  Labs reviewed. K: 3.2, CBGS: 81-104.  11/27: -Pt advanced to Regular diet, then modified to Soft diet on 11/26 -Pt reported consuming 50-100% of meals -Pt aware he is on Soft diet, but was not educated on why diet modification had been made. Pt confirmed episodes of loose stools; which RD educated that complying with a low fiber/residue diet can assist alleviate/improve these symptoms  -Has been drinking 2-3 Ensure Complete daily, and enjoys tastes. Provided pt with nutrition supplement coupons, and reviewed supplement options and alternatives for d/c needs -Pt started on Chemo 11/27. No current nutrition related side effects except diarrhea -Dronabinol started on 11/27. Pt to receive TID; continue with appetite stimulant regimen. -Weight trending down, - 3 lb in past 5 days  Height: Ht Readings from Last 1 Encounters:  01/02/14 5\' 10"  (1.778 m)    Weight Status:   Wt Readings from Last 1  Encounters:  01/02/14 155 lb 6.8 oz (70.5 kg)  12/28/13 158 lb  Re-estimated needs:  Kcal: 1800-2000 Protein: 95-110 g Fluid: 1.8-2.0 L/day  Skin: Intact  Diet Order: DIET SOFT   Intake/Output Summary (Last 24 hours) at 01/04/14 1509 Last data filed at 01/04/14 1434  Gross per 24 hour  Intake    480 ml  Output   1150 ml  Net   -670 ml    Last BM: 01/03/14   Labs:   Recent Labs Lab 01/01/14 0543 01/04/14 0515 01/04/14 0810  NA 137 136* 134*  K 2.9* 4.2 4.4  CL 99 98 95*  CO2 24 27 25   BUN 8 13 14   CREATININE 0.93 1.00 0.98  CALCIUM 8.7 8.9 9.3  GLUCOSE 86 93 138*    CBG (last 3)   Recent Labs  01/03/14 1143 01/03/14 1706 01/03/14 2059  GLUCAP 99 101* 121*    Scheduled Meds: . aspirin EC  325 mg Oral Daily  . citalopram  10 mg Oral Daily  . dronabinol  5 mg Oral TID AC  . enoxaparin (LOVENOX) injection  40 mg Subcutaneous Q24H  . feeding supplement (ENSURE COMPLETE)  237 mL Oral TID BM  . FLUOROURACIL (ADRUCIL) CHEMO infusion For Inpatient Use  1,200 mg/m2 (Treatment Plan Actual) Intravenous Q24H  . flurouracil CHEMO IVPB infusion  400 mg/m2 (Treatment Plan Actual) Intravenous Once  . isosorbide-hydrALAZINE  1 tablet Oral TID  . leucovorin (WELLCOVORIN) IV infusion  400 mg/m2 (Treatment Plan Actual) Intravenous Once  . metoprolol succinate  50 mg Oral Daily  . nicotine  7 mg Transdermal  Daily  . oxaliplatin (ELOXATIN) CHEMO IV infusion  85 mg/m2 (Treatment Plan Actual) Intravenous Once  . pantoprazole  40 mg Oral Daily  . pravastatin  20 mg Oral q1800    Continuous Infusions: . 0.9 % NaCl with KCl 40 mEq / L 50 mL/hr (01/03/14 1809)  . lactated ringers 50 mL/hr at 01/02/14 [place] LDN Clinical Dietitian [phone]

## 2014-01-04 NOTE — Progress Notes (Signed)
[name] is now over at [facility].  PAC has been placed. appetite is still low. I will start Marinol. I think this will help.  He's not having any pain. He says he is going to the bathroom.  He's had no issues with vomiting.  He says there is no dizziness.  We will see what his labs look like today.  We will go ahead and start chemotherapy on him. I will start him on FOLFOX. He will not lose his hair with this.  He's had no fever. He's had no leg swelling.  Physical therapy is helping. I very much appreciate their help.  I very much appreciate the help from the hospitals.  Now that his invasive procedures are done, we can get him on aspirin. I think a full dose aspirin would be okay. I would not anticipate that bleeding should be a problem.  He is at significant risk for cerebral vascular disease. He has coronary artery disease. He has high blood pressure. He has hyperlipidemia.  His vital signs look okay. His blood pressure is on the high side at 150/95. He is afebrile. Pulse is 74. His lungs are clear. Cardiac exam shows a regular rate and rhythm. He has a 3/6 systolic ejection murmur. Abdomen is somewhat distended. He has hepatomegaly that is quite substantial. Extremities shows no clubbing, cyanosis or edema. Skin exam shows no rashes. Neurological exam is without focal deficits.  Again, I will write for his chemotherapy. I will get him on some Marinol. I will also start a full dose aspirin.  I think we should be able to get him home on Monday. He will need to have his labs checked over the weekend.  Again, I thank the staff on [address] [facility] for all their help.  Cody E.  Oswaldo Milian 9:7

## 2014-01-04 NOTE — Progress Notes (Signed)
Left neck incision and Port-a-cath site clean.

## 2014-01-04 NOTE — Progress Notes (Signed)
[facility] PROGRESS NOTE  [name] WUJ:[id number] DOB: Feb 08, 1956 DOA: 12/27/2013 PCP: No primary care provider on file.  Brief narrative:    58 year old male with past medical history of hypertension, dyslipidemia, history of CVA, aortic stenosis and repair of aortic aneurysm at [facility] who resented to [facility] ED with complaints of dizziness, lightheadedness, unintentional weight loss of more than 30 pounds, abdominal discomfort. A CT abdomen/pelvis was done and demonstrated a sigmoid mass with high suspicious for colon cancer with liver metastasis GI was consulted and a colonoscopy was obtained for tissue sample and reveled almost complete obstruction of colon. Oncology has been contacted to help with staging and they are anticipating chemotherapy treatment. Surgery has placed stent for colonic mass and has placed port-a-cath. Hospital course further complicated with new acute and subacute cortical and subcortical CVA.  Assessment/Plan:     Principal Problem: Dizziness and lightheadedness / acute and subacute punctate cortical and subcortical stroke - as seen on MRI brain . Pt with risk factors including hypertension, dyslipidemia and evidence of prior strokes on MRI brain - obvious concern for hypercoagulable state considering malignancy but full dose aspirin favored over anticoagulation due to risk factors. 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 Aspirin however not ordered due to need for biopsies and invasive procedures coming. Since patient already had stent placed and PAC placed we started aspirin daily.  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 Plan for chemotherapy today. Port-A-Cath was placed 01/02/2014. 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 Potassium remains within normal limits.  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 Nonspecific small pulmonary nodules. These are likely new since the remote exam of 07/20/2010. Early pulmonary metastasis cannot be excluded. 2. Status post right sided aortic stent graft repair, without acute complication. 3. Cardiomegaly and left ventricular hypertrophy. 4. Multiple liver masses with suggestion of cirrhosis. This could represent metastatic disease and/or hepatocellular carcinoma/carcinomas. 5. Upper abdominal ascites. 6. Gynecomastia.   Dg Chest Port 1 View 01/02/2014 No pneumothorax status post left internal jugular Port-A-Cath placement. Distal tip is seen overlying expected position of cavoatrial junction.   Dg Abd 2 Views 01/02/2014 1. There is no bowel obstruction demonstrated. A partially  collapsed sigmoid colonic stent graft is present within  the true bony pelvis. 2. Gallstones    Dg Abd 2 Views 01/01/2014 Stent placement for treatment of colonic mass.   Dg C-arm 61-120 Min 01/01/2014 Stent placement for treatment of colonic mass.  Medical Consultants:   GI  Neurology  Oncology   General surgery  Other Consultants:   Physical therapy - home health PT  Nutrition  IAnti-Infectives:    None    Leisa Lenz, MD  [facility] Pager [phone]  If 7PM-7AM, please contact night-coverage [email or web address] Password [email or web address] 01/04/2014, 10:04 AM   LOS: 8 days    HPI/Subjective: No acute overnight events.  Objective: Filed Vitals:   01/03/14 0610 01/03/14 1343 01/03/14 2119 01/04/14 0601  BP: 121/87 146/99 122/97 150/95  Pulse: 85 81 92 74  Temp: 98.6 F (37 C) 98.4 F (36.9 C) 98.7 F (37.1 C) 98.1 F (36.7 C)  TempSrc: Oral Oral Oral Oral  Resp: 20 20 20 20   Height:      Weight:      SpO2: 100% 99% 100% 100%    Intake/Output Summary (Last 24 hours) at 01/04/14 1004 Last data filed at 01/04/14 0800  Gross per 24 hour  Intake    240 ml  Output    925 ml  Net   -685 ml    Exam:   General:  Pt is alert, follows commands appropriately, not in acute distress  Cardiovascular: Regular rate and rhythm, S1/S2 appreciated, (+) SEM   Respiratory: Clear to auscultation bilaterally, no wheezing, no crackles, no rhonchi  Abdomen: distended, (+) hepatomegaly, bowel sounds present  Extremities: No edema, pulses DP and PT palpable bilaterally  Neuro: Grossly nonfocal  Data Reviewed: Basic Metabolic Panel:  Recent Labs Lab 01/01/14 0543 01/04/14 0515 01/04/14 0810  NA 137 136* 134*  K 2.9* 4.2 4.4  CL 99 98 95*  CO2 24 27 25   GLUCOSE 86 93 138*  BUN 8 13 14   CREATININE 0.93 1.00 0.98  CALCIUM 8.7 8.9 9.3   Liver Function Tests:  Recent Labs Lab 01/04/14 0810  AST 83*  ALT 17  ALKPHOS 285*  BILITOT 0.4  PROT 7.9   ALBUMIN 2.6*   No results for input(s): LIPASE, AMYLASE in the last 168 hours. No results for input(s): AMMONIA in the last 168 hours. CBC:  Recent Labs Lab 01/01/14 0543 01/04/14 0810  WBC 14.0* 15.3*  HGB 10.2* 11.2*  HCT 31.0* 33.6*  MCV 79.1 81.2  PLT 321 385   Cardiac Enzymes: No results for input(s): CKTOTAL, CKMB, CKMBINDEX, TROPONINI in the last 168 hours. BNP: Invalid input(s): POCBNP CBG:  Recent Labs Lab 01/03/14 0020 01/03/14 0708 01/03/14 1143 01/03/14 1706 01/03/14 2059  GLUCAP 116* 103* 99 101* 121*    Culture, blood (routine x 2)     Status: None   Collection Time: 12/28/13  3:34 AM  Result Value Ref Range Status   Specimen Description BLOOD RIGHT ARM  Final   Special Requests BOTTLES DRAWN AEROBIC AND ANAEROBIC 10 CC  Final   Culture  Setup Time   Final   Culture   Final    NO GROWTH 5 DAYS   Report Status 01/03/2014 FINAL  Final  Culture, blood (routine x 2)     Status: None   Collection Time: 12/28/13  3:46 AM  Result Value Ref Range Status   Specimen Description BLOOD LEFT FOREARM  Final   Special Requests BOTTLES DRAWN AEROBIC AND ANAEROBIC 10 CC  Final   Culture  Setup Time  Final   Culture   Final    NO GROWTH 5 DAYS   Report Status 01/03/2014 FINAL  Final  Surgical pcr screen     Status: Abnormal   Collection Time: 01/02/14 10:12 AM  Result Value Ref Range Status   MRSA, PCR NEGATIVE NEGATIVE Final   Staphylococcus aureus POSITIVE (A) NEGATIVE Final    Comment:           Scheduled Meds: . aspirin EC  325 mg Oral Daily  . citalopram  10 mg Oral Daily  . dextrose   Intravenous Once  . dronabinol  5 mg Oral TID AC  . enoxaparin (LOVENOX) injection  40 mg Subcutaneous Q24H  . feeding supplement (ENSURE COMPLETE)  237 mL Oral TID BM  . FLUOROURACIL (ADRUCIL) CHEMO infusion For Inpatient Use  1,200 mg/m2 (Treatment Plan Actual) Intravenous Q24H  . flurouracil CHEMO IVPB infusion  400 mg/m2 (Treatment Plan Actual) Intravenous Once   . isosorbide-hydrALAZINE  1 tablet Oral TID  . leucovorin (WELLCOVORIN) IV infusion  400 mg/m2 (Treatment Plan Actual) Intravenous Once  . metoprolol succinate  50 mg Oral Daily  . nicotine  7 mg Transdermal Daily  . ondansetron (ZOFRAN) with dexamethasone (DECADRON) IV   Intravenous Once  . oxaliplatin (ELOXATIN) CHEMO IV infusion  85 mg/m2 (Treatment Plan Actual) Intravenous Once  . pantoprazole  40 mg Oral Daily  . pravastatin  20 mg Oral q1800   Continuous Infusions: . 0.9 % NaCl with KCl 40 mEq / L 50 mL/hr (01/03/14 1809)  . lactated ringers 50 mL/hr at 01/02/14 1224

## 2014-01-05 LAB — COMPREHENSIVE METABOLIC PANEL
ALT: 18 U/L (ref 0–53)
AST: 88 U/L — AB (ref 0–37)
Albumin: 2.5 g/dL — ABNORMAL LOW (ref 3.5–5.2)
Alkaline Phosphatase: 290 U/L — ABNORMAL HIGH (ref 39–117)
Anion gap: 14 (ref 5–15)
BUN: 18 mg/dL (ref 6–23)
CALCIUM: 9.4 mg/dL (ref 8.4–10.5)
CO2: 26 mEq/L (ref 19–32)
CREATININE: 1.01 mg/dL (ref 0.50–1.35)
Chloride: 91 mEq/L — ABNORMAL LOW (ref 96–112)
GFR calc Af Amer: >90 mL/min (ref >90)
GFR, EST NON AFRICAN AMERICAN: 80 mL/min — AB (ref >90)
Glucose, Bld: 111 mg/dL — ABNORMAL HIGH (ref 70–99)
Potassium: 4.7 mEq/L (ref 3.7–5.3)
Sodium: 131 mEq/L — ABNORMAL LOW (ref 137–147)
Total Bilirubin: 0.3 mg/dL (ref 0.3–1.2)
Total Protein: 7.9 g/dL (ref 6.0–8.3)

## 2014-01-05 LAB — GLUCOSE, CAPILLARY
GLUCOSE-CAPILLARY: 111 mg/dL — AB (ref 70–99)
GLUCOSE-CAPILLARY: 113 mg/dL — AB (ref 70–99)
Glucose-Capillary: 110 mg/dL — ABNORMAL HIGH (ref 70–99)
Glucose-Capillary: 113 mg/dL — ABNORMAL HIGH (ref 70–99)

## 2014-01-05 LAB — CBC
HCT: 33.1 % — ABNORMAL LOW (ref 39.0–52.0)
Hemoglobin: 10.7 g/dL — ABNORMAL LOW (ref 13.0–17.0)
MCH: 26.7 pg (ref 26.0–34.0)
MCHC: 32.3 g/dL (ref 30.0–36.0)
MCV: 82.5 fL (ref 78.0–100.0)
PLATELETS: 341 10*3/uL (ref 150–400)
RBC: 4.01 MIL/uL — ABNORMAL LOW (ref 4.22–5.81)
RDW: 19.6 % — ABNORMAL HIGH (ref 11.5–15.5)
WBC: 17.7 10*3/uL — ABNORMAL HIGH (ref 4.0–10.5)

## 2014-01-05 NOTE — Progress Notes (Signed)
Patient ID: [name], male   DOB: 05-13-1955, 58 y.o.   MRN: [id number] [facility] PROGRESS NOTE  [name] [id number]:[id number] DOB: [birth year] DOA: 12/27/2013 PCP: No primary care provider on file.  Brief narrative:    59 year old male with past medical history of hypertension, dyslipidemia, history of CVA, aortic stenosis and repair of aortic aneurysm at [facility] who resented to [facility] ED with complaints of dizziness, lightheadedness, unintentional weight loss of more than 30 pounds, abdominal discomfort. A CT abdomen/pelvis was done and demonstrated a sigmoid mass with high suspicious for colon cancer with liver metastasis GI was consulted and a colonoscopy was obtained for tissue sample and reveled almost complete obstruction of colon. Surgery has placed stent for colonic mass and has placed port-a-cath. Per oncology, chemotherapy initiated 01/04/2014. Additionally, patient was found to have new acute and subacute cortical and subcortical CVA.   Assessment/Plan:    Principal Problem: Dizziness and lightheadedness / acute and subacute punctate cortical and subcortical stroke - as seen on MRI brain . Pt with risk factors including hypertension, dyslipidemia and evidence of prior strokes on MRI brain - obvious concern for hypercoagulable state considering malignancy but full dose aspirin favored over anticoagulation due to risk factors. 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 Aspirin however not ordered due to need for biopsies and invasive procedures coming. Since patient already had stent placed and PAC placed we started aspirin daily.  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 Port-A-Cath was placed 01/02/2014. 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 Potassium remains within normal limits.  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 Nonspecific small pulmonary nodules. These are likely new since the remote exam of 07/20/2010. Early pulmonary metastasis cannot be excluded. 2. Status post right sided aortic stent graft repair, without acute complication. 3. Cardiomegaly and left ventricular hypertrophy. 4. Multiple liver masses with suggestion of cirrhosis. This could represent metastatic disease and/or hepatocellular carcinoma/carcinomas. 5. Upper abdominal ascites. 6. Gynecomastia.   Dg Chest Port 1 View 01/02/2014 No pneumothorax status post left internal jugular Port-A-Cath placement. Distal tip is seen overlying expected position of cavoatrial junction.   Dg Abd 2 Views 01/02/2014 1. There is no bowel obstruction demonstrated. A partially collapsed sigmoid  colonic stent graft is present within  the true bony pelvis. 2. Gallstones    Dg Abd 2 Views 01/01/2014 Stent placement for treatment of colonic mass.   Dg C-arm 61-120 Min 01/01/2014 Stent placement for treatment of colonic mass.  Medical Consultants:   GI  Neurology  Oncology   General surgery Other Consultants:   Physical therapy - home health PT  Nutrition IAnti-Infectives:    None    Leisa Lenz, MD  [facility] Pager [phone]  If 7PM-7AM, please contact night-coverage [email or web address] Password [email or web address] 01/05/2014, 12:16 PM   LOS: 9 days    HPI/Subjective: No acute overnight events.  Objective: Filed Vitals:   01/04/14 1432 01/04/14 2244 01/05/14 0200 01/05/14 0635  BP: 157/97 107/79 141/99 123/88  Pulse: 76 81  87  Temp: 98.6 F (37 C) 97.4 F (36.3 C)  98.3 F (36.8 C)  TempSrc: Oral Oral  Oral  Resp: 20 20  20   Height:      Weight:      SpO2: 100% 100%  100%    Intake/Output Summary (Last 24 hours) at 01/05/14 1216 Last data filed at 01/04/14 1915  Gross per 24 hour  Intake    480 ml  Output   1300 ml  Net   -820 ml    Exam:   General:  Pt is alert, follows commands appropriately, not in acute distress  Cardiovascular: Regular rate and rhythm, S1/S2 appreciated   Respiratory: Clear to auscultation bilaterally, no wheezing, no crackles, no rhonchi  Abdomen:  distended, bowel sounds present  Extremities: pulses DP and PT palpable bilaterally  Neuro: Grossly nonfocal  Data Reviewed: Basic Metabolic Panel:  Recent Labs Lab 01/01/14 0543 01/04/14 0515 01/04/14 0810 01/05/14 0450  NA 137 136* 134* 131*  K 2.9* 4.2 4.4 4.7  CL 99 98 95* 91*  CO2 24 27 25 26   GLUCOSE 86 93 138* 111*  BUN 8 13 14 18   CREATININE 0.93 1.00 0.98 1.01  CALCIUM 8.7 8.9 9.3 9.4   Liver Function Tests:  Recent Labs Lab 01/04/14 0810 01/05/14 0450  AST 83* 88*  ALT 17 18  ALKPHOS 285* 290*  BILITOT 0.4 0.3  PROT 7.9 7.9  ALBUMIN  2.6* 2.5*   No results for input(s): LIPASE, AMYLASE in the last 168 hours. No results for input(s): AMMONIA in the last 168 hours. CBC:  Recent Labs Lab 01/01/14 0543 01/04/14 0810 01/05/14 0450  WBC 14.0* 15.3* 17.7*  HGB 10.2* 11.2* 10.7*  HCT 31.0* 33.6* 33.1*  MCV 79.1 81.2 82.5  PLT 321 385 341   Cardiac Enzymes: No results for input(s): CKTOTAL, CKMB, CKMBINDEX, TROPONINI in the last 168 hours. BNP: Invalid input(s): POCBNP CBG:  Recent Labs Lab 01/03/14 1706 01/03/14 2059 01/04/14 1633 01/05/14 0747 01/05/14 1132  GLUCAP 101* 121* 135* 111* 113*    Recent Results (from the past 240 hour(s))  Culture, blood (routine x 2)     Status: None   Collection Time: 12/28/13  3:34 AM  Result Value Ref Range Status   Specimen Description BLOOD RIGHT ARM  Final   Special Requests BOTTLES DRAWN AEROBIC AND ANAEROBIC 10 CC  Final   Culture  Setup Time   Final   Culture   Final    NO GROWTH 5 DAYS Performed at [facility]    Report Status 01/03/2014 FINAL  Final  Culture, blood (routine x 2)     Status: None   Collection Time: 12/28/13  3:46 AM  Result Value Ref Range Status  Specimen Description BLOOD LEFT FOREARM  Final   Special Requests BOTTLES DRAWN AEROBIC AND ANAEROBIC 10 CC  Final   Culture  Setup Time   Final   Culture   Final    NO GROWTH 5 DAYS Performed at [facility]    Report Status 01/03/2014 FINAL  Final  Surgical pcr screen     Status: Abnormal   Collection Time: 01/02/14 10:12 AM  Result Value Ref Range Status   MRSA, PCR NEGATIVE NEGATIVE Final   Staphylococcus aureus POSITIVE (A) NEGATIVE Final    Comment:           Scheduled Meds: . aspirin EC  325 mg Oral Daily  . citalopram  10 mg Oral Daily  . dronabinol  5 mg Oral TID AC  . enoxaparin (LOVENOX) injection  40 mg Subcutaneous Q24H  . feeding supplement (ENSURE COMPLETE)  237 mL Oral TID BM  . FLUOROURACIL (ADRUCIL) CHEMO infusion For Inpatient Use  1,200 mg/m2  (Treatment Plan Actual) Intravenous Q24H  . isosorbide-hydrALAZINE  1 tablet Oral TID  . metoprolol succinate  50 mg Oral Daily  . nicotine  7 mg Transdermal Daily  . pantoprazole  40 mg Oral Daily  . pravastatin  20 mg Oral q1800   Continuous Infusions: . 0.9 % NaCl with KCl 40 mEq / L 50 mL/hr (01/03/14 1809)  . lactated ringers 50 mL/hr at 01/02/14 1224

## 2014-01-05 NOTE — Plan of Care (Signed)
Problem: Consults Goal: Chemotherapy Patient Education Description: See Patient Education Module for education specifics. Outcome: Completed/Met Date Met:  01/05/14  Problem: Phase I Progression Outcomes Goal: Absence of IV chemotherapy infiltration Outcome: Completed/Met Date Met:  01/05/14 Goal: IV site with good blood return Outcome: Completed/Met Date Met:  01/05/14

## 2014-01-05 NOTE — Progress Notes (Signed)
5FU dosage verified with Cline Crock RN.

## 2014-01-06 ENCOUNTER — Inpatient Hospital Stay (HOSPITAL_COMMUNITY): Payer: Medicaid Other

## 2014-01-06 LAB — URINALYSIS, ROUTINE W REFLEX MICROSCOPIC
Bilirubin Urine: NEGATIVE
Glucose, UA: NEGATIVE mg/dL
Hgb urine dipstick: NEGATIVE
Ketones, ur: NEGATIVE mg/dL
Leukocytes, UA: NEGATIVE
Nitrite: NEGATIVE
Protein, ur: 30 mg/dL — AB
Specific Gravity, Urine: 1.016 (ref 1.005–1.030)
Urobilinogen, UA: 1 mg/dL (ref 0.0–1.0)
pH: 6.5 (ref 5.0–8.0)

## 2014-01-06 LAB — CBC
HCT: 29.4 % — ABNORMAL LOW (ref 39.0–52.0)
Hemoglobin: 9.7 g/dL — ABNORMAL LOW (ref 13.0–17.0)
MCH: 26.7 pg (ref 26.0–34.0)
MCHC: 33 g/dL (ref 30.0–36.0)
MCV: 81 fL (ref 78.0–100.0)
PLATELETS: 330 10*3/uL (ref 150–400)
RBC: 3.63 MIL/uL — ABNORMAL LOW (ref 4.22–5.81)
RDW: 19.9 % — AB (ref 11.5–15.5)
WBC: 15.3 10*3/uL — ABNORMAL HIGH (ref 4.0–10.5)

## 2014-01-06 LAB — COMPREHENSIVE METABOLIC PANEL
ALBUMIN: 2.4 g/dL — AB (ref 3.5–5.2)
ALT: 36 U/L (ref 0–53)
AST: 150 U/L — AB (ref 0–37)
Alkaline Phosphatase: 357 U/L — ABNORMAL HIGH (ref 39–117)
Anion gap: 14 (ref 5–15)
BUN: 24 mg/dL — ABNORMAL HIGH (ref 6–23)
CALCIUM: 9 mg/dL (ref 8.4–10.5)
CO2: 27 mEq/L (ref 19–32)
Chloride: 93 mEq/L — ABNORMAL LOW (ref 96–112)
Creatinine, Ser: 1.2 mg/dL (ref 0.50–1.35)
GFR calc Af Amer: 75 mL/min — ABNORMAL LOW (ref >90)
GFR calc non Af Amer: 65 mL/min — ABNORMAL LOW (ref >90)
Glucose, Bld: 108 mg/dL — ABNORMAL HIGH (ref 70–99)
Potassium: 4.5 mEq/L (ref 3.7–5.3)
Sodium: 134 mEq/L — ABNORMAL LOW (ref 137–147)
TOTAL PROTEIN: 7.3 g/dL (ref 6.0–8.3)
Total Bilirubin: 0.5 mg/dL (ref 0.3–1.2)

## 2014-01-06 LAB — URINE MICROSCOPIC-ADD ON

## 2014-01-06 LAB — GLUCOSE, CAPILLARY
Glucose-Capillary: 101 mg/dL — ABNORMAL HIGH (ref 70–99)
Glucose-Capillary: 117 mg/dL — ABNORMAL HIGH (ref 70–99)
Glucose-Capillary: 106 mg/dL — ABNORMAL HIGH (ref 70–99)
Glucose-Capillary: 121 mg/dL — ABNORMAL HIGH (ref 70–99)

## 2014-01-06 MED ORDER — IBUPROFEN 400 MG PO TABS
400.0000 mg | ORAL_TABLET | Freq: Once | ORAL | Status: AC
  Administered 2014-01-06: 400 mg via ORAL
  Filled 2014-01-06: qty 1

## 2014-01-06 MED ORDER — LOPERAMIDE HCL 2 MG PO CAPS
2.0000 mg | ORAL_CAPSULE | ORAL | Status: DC | PRN
  Administered 2014-01-06 (×2): 2 mg via ORAL
  Filled 2014-01-06 (×2): qty 1

## 2014-01-06 NOTE — Progress Notes (Signed)
Patient ID: [name], male   DOB: 1955/10/17, 58 y.o.   MRN: [id number] [facility] PROGRESS NOTE  [name] PNT:[id number] DOB: [birth year] DOA: 12/27/2013 PCP: No primary care provider on file.  Brief narrative:    58 year old male with past medical history of hypertension, dyslipidemia, history of CVA, aortic stenosis and repair of aortic aneurysm at [facility] who resented to [facility] ED with complaints of dizziness, lightheadedness, unintentional weight loss of more than 30 pounds, abdominal discomfort. A CT abdomen/pelvis was done and demonstrated a sigmoid mass with high suspicious for colon cancer with liver metastasis GI was consulted and a colonoscopy was obtained for tissue sample and reveled almost complete obstruction of colon. Surgery has placed stent for colonic mass and has placed port-a-cath. Per oncology, chemotherapy initiated 01/04/2014. Additionally, patient was found to have new acute and subacute cortical and subcortical CVA.   Assessment/Plan:     Principal Problem: Dizziness and lightheadedness / acute and subacute punctate cortical and subcortical stroke - as seen on MRI brain . Pt with risk factors including hypertension, dyslipidemia and evidence of prior strokes on MRI brain - continue aspirin daily for secondary stroke prevention - no focal neurologic deficits  - per PT eval - home health PT recommended   Active Problems: Abdominal mass with complete colon obstruction / elevated alkaline phosphatase level / abnormal liver function tests - likely colon cancer with liver and possible lung metastases as seen on CT abdomen - colonoscopy done 11/22 showed low and high grade dysplasia but no frank malignancy - patient is status post sigmoidoscopy with metal stenting to help with narrowing give risk for surgery and severe hepatomegaly--he isn't a surgical candidate - Chemotherapy started 01/04/2014. Port-A-Cath was placed 01/02/2014. 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 Potassium remains within normal limits.  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  Family Communication:  plan of care discussed with the patient Disposition Plan: Home when stable.   IV access:   PAC  Procedures and diagnostic studies:    Ct Chest W Contrast 12/31/2013 1. Nonspecific small pulmonary nodules. These are likely new since the remote exam of 07/20/2010. Early pulmonary metastasis cannot be excluded. 2. Status post right sided aortic stent graft repair, without acute complication. 3. Cardiomegaly and left ventricular hypertrophy. 4. Multiple liver masses with suggestion of cirrhosis. This could represent metastatic disease and/or hepatocellular carcinoma/carcinomas. 5. Upper abdominal ascites. 6. Gynecomastia.   Dg Chest Port 1 View 01/02/2014 No pneumothorax status post left internal jugular Port-A-Cath placement. Distal tip is seen overlying expected position of cavoatrial junction.   Dg Abd 2 Views 01/02/2014 1. There is no bowel obstruction demonstrated. A partially collapsed sigmoid colonic stent graft is present within the true bony pelvis. 2. Gallstones    Dg Abd 2 Views 01/01/2014 Stent placement for treatment of colonic mass.  Dg C-arm  61-120 Min 01/01/2014 Stent placement for treatment of colonic mass.  Dg Fluoro Guide Cv Line Left 01/04/2014  Intraoperative images from Port-A-Cath placement as described.     Medical Consultants:   GI  Neurology  Oncology   General surgery Other Consultants:   Physical therapy - home health PT  Nutrition IAnti-Infectives:    None    Leisa Lenz, MD  [facility] Pager [phone]  If 7PM-7AM, please contact night-coverage [email or web address] Password [email or web address] 01/06/2014, 9:12 AM   LOS: 10 days    HPI/Subjective: No acute overnight events. Still with diarrhea.  Objective: Filed Vitals:   01/05/14 1316 01/05/14 2019 01/05/14 2152 01/06/14 0445  BP: 97/75 91/64 110/77 129/83  Pulse: 94 95  98  Temp: 99 F (37.2 C) 98 F (36.7 C)  99.1 F (37.3 C)  TempSrc: Oral Oral  Oral  Resp: 20 20  18   Height:      Weight:      SpO2: 100% 99%  98%    Intake/Output Summary (Last 24 hours) at 01/06/14 0912 Last data filed at 01/06/14 0716  Gross per 24 hour  Intake      0 ml  Output    600 ml  Net   -600 ml    Exam:   General:  Pt is alert, follows commands appropriately, not in acute distress  Cardiovascular: Regular rate and rhythm, S1/S2, no murmurs  Respiratory: Clear to auscultation bilaterally, no wheezing, no crackles, no rhonchi  Abdomen: Soft, non tender, non distended, bowel sounds present  Extremities: No edema, pulses DP and PT palpable bilaterally  Neuro: Grossly nonfocal  Data Reviewed: Basic Metabolic Panel:  Recent Labs Lab 01/01/14 0543 01/04/14 0515 01/04/14 0810 01/05/14 0450 01/06/14 0518  NA 137 136* 134* 131* 134*  K 2.9* 4.2 4.4 4.7 4.5  CL 99 98 95* 91* 93*  CO2 24 27 25 26 27   GLUCOSE 86 93 138* 111* 108*  BUN 8 13 14 18  24*  CREATININE 0.93 1.00 0.98 1.01 1.20  CALCIUM 8.7 8.9 9.3 9.4 9.0   Liver Function Tests:  Recent Labs Lab 01/04/14 0810 01/05/14 0450 01/06/14 0518  AST 83* 88* 150*  ALT 17 18 36   ALKPHOS 285* 290* 357*  BILITOT 0.4 0.3 0.5  PROT 7.9 7.9 7.3  ALBUMIN 2.6* 2.5* 2.4*   No results for input(s): LIPASE, AMYLASE in the last 168 hours. No results for input(s): AMMONIA in the last 168 hours. CBC:  Recent Labs Lab 01/01/14 0543 01/04/14 0810 01/05/14 0450 01/06/14 0518  WBC 14.0* 15.3* 17.7* 15.3*  HGB 10.2* 11.2* 10.7* 9.7*  HCT 31.0* 33.6* 33.1* 29.4*  MCV 79.1 81.2 82.5 81.0  PLT 321 385 341 330   Cardiac Enzymes: No results for input(s): CKTOTAL, CKMB, CKMBINDEX, TROPONINI in the last 168 hours. BNP: Invalid input(s): POCBNP CBG:  Recent Labs Lab 01/05/14 0747 01/05/14 1132 01/05/14 1651 01/05/14 2120 01/06/14 0726  GLUCAP 111* 113* 110* 113* 101*    Recent Results (from the past 240 hour(s))  Culture, blood (routine x 2)     Status: None   Collection Time: 12/28/13  3:34 AM  Result Value Ref Range Status   Specimen Description BLOOD RIGHT ARM  Final   Special Requests BOTTLES DRAWN AEROBIC AND ANAEROBIC 10 CC  Final   Culture  Setup Time   Final   Culture   Final    NO GROWTH 5 DAYS Performed at [facility]    Report  Status 01/03/2014 FINAL  Final  Culture, blood (routine x 2)     Status: None   Collection Time: 12/28/13  3:46 AM  Result Value Ref Range Status   Specimen Description BLOOD LEFT FOREARM  Final   Special Requests BOTTLES DRAWN AEROBIC AND ANAEROBIC 10 CC  Final   Culture  Setup Time   Final   Culture   Final    NO GROWTH 5 DAYS Performed at [facility]    Report Status 01/03/2014 FINAL  Final  Surgical pcr screen     Status: Abnormal   Collection Time: 01/02/14 10:12 AM  Result Value Ref Range Status   MRSA, PCR NEGATIVE NEGATIVE Final   Staphylococcus aureus POSITIVE (A) NEGATIVE Final     Scheduled Meds: . aspirin EC  325 mg Oral Daily  . citalopram  10 mg Oral Daily  . dronabinol  5 mg Oral TID AC  . enoxaparin (LOVENOX) injection  40 mg Subcutaneous Q24H  . feeding supplement (ENSURE  COMPLETE)  237 mL Oral TID BM  . FLUOROURACIL (ADRUCIL) CHEMO infusion For Inpatient Use  1,200 mg/m2 (Treatment Plan Actual) Intravenous Q24H  . isosorbide-hydrALAZINE  1 tablet Oral TID  . metoprolol succinate  50 mg Oral Daily  . nicotine  7 mg Transdermal Daily  . pantoprazole  40 mg Oral Daily  . pravastatin  20 mg Oral q1800   Continuous Infusions: . 0.9 % NaCl with KCl 40 mEq / L 50 mL/hr (01/03/14 1809)  . lactated ringers 50 mL/hr at 01/02/14 1224

## 2014-01-06 NOTE — Progress Notes (Signed)
IP PROGRESS NOTE  Subjective:  He began a first cycle of FOLFOX on 01/04/2014. No nausea/vomiting or neuropathy symptoms. He reports multiple episodes of diarrhea yesterday.  Objective: Vital signs in last 24 hours: Blood pressure 129/83, pulse 98, temperature 99.1 F (37.3 C), temperature source Oral, resp. rate 18, height 5\' 10"  (1.778 m), weight 155 lb 6.8 oz (70.5 kg), SpO2 98 %.  Intake/Output from previous day: 11/28 0701 - 11/29 0700 In: 240 [P.O.:240] Out: 400 [Urine:400]  Physical Exam:  HEENT: No thrush Lungs: Decreased breath sounds at the lower chest, no respiratory distress Cardiac: Regular rate and rhythm, 2/6 systolic murmur Abdomen: Massive hepatomegaly, nontender Extremities: No leg edema   Portacath/PICC-without erythema, ecchymosis  Lab Results:  Recent Labs  01/05/14 0450 01/06/14 0518  WBC 17.7* 15.3*  HGB 10.7* 9.7*  HCT 33.1* 29.4*  PLT 341 330    BMET  Recent Labs  01/05/14 0450 01/06/14 0518  NA 131* 134*  K 4.7 4.5  CL 91* 93*  CO2 26 27  GLUCOSE 111* 108*  BUN 18 24*  CREATININE 1.01 1.20  CALCIUM 9.4 9.0     Medications: I have reviewed the patient's current medications.  Assessment/Plan:  1. Metastatic colon cancer, sigmoid colon mass and liver metastases  Status post sigmoid colon stent 01/01/2014  Initiation of FOLFOX chemotherapy 01/04/2014  2. History of coronary artery disease  3. Stent graft repair of aortic aneurysm  4. Anemia  5. Diarrhea-potentially related to relief of obstruction from the sigmoid stent. I doubt the diarrhea is related to 5-fluorouracil. Add Imodium for persistent diarrhea.  He will complete the 5-fluorouracil infusion today. He will be scheduled for cycle 2 FOLFOX as an outpatient in 2 weeks.  LOS: 10 days   Royce Stegman  01/06/2014, 8:08 AM

## 2014-01-06 NOTE — Plan of Care (Signed)
Problem: Discharge Progression Outcomes Goal: Tolerates po intake in previous 24 hours Outcome: Completed/Met Date Met:  01/06/14

## 2014-01-07 ENCOUNTER — Encounter (HOSPITAL_COMMUNITY): Payer: Self-pay | Admitting: General Surgery

## 2014-01-07 DIAGNOSIS — C801 Malignant (primary) neoplasm, unspecified: Secondary | ICD-10-CM

## 2014-01-07 LAB — GLUCOSE, CAPILLARY
GLUCOSE-CAPILLARY: 111 mg/dL — AB (ref 70–99)
Glucose-Capillary: 115 mg/dL — ABNORMAL HIGH (ref 70–99)
Glucose-Capillary: 97 mg/dL (ref 70–99)

## 2014-01-07 LAB — CBC
HEMATOCRIT: 28.4 % — AB (ref 39.0–52.0)
HEMOGLOBIN: 9.2 g/dL — AB (ref 13.0–17.0)
MCH: 26.8 pg (ref 26.0–34.0)
MCHC: 32.4 g/dL (ref 30.0–36.0)
MCV: 82.8 fL (ref 78.0–100.0)
Platelets: 260 10*3/uL (ref 150–400)
RBC: 3.43 MIL/uL — ABNORMAL LOW (ref 4.22–5.81)
RDW: 20.3 % — ABNORMAL HIGH (ref 11.5–15.5)
WBC: 12.2 10*3/uL — ABNORMAL HIGH (ref 4.0–10.5)

## 2014-01-07 LAB — COMPREHENSIVE METABOLIC PANEL
ALK PHOS: 287 U/L — AB (ref 39–117)
ALT: 31 U/L (ref 0–53)
AST: 113 U/L — ABNORMAL HIGH (ref 0–37)
Albumin: 2.2 g/dL — ABNORMAL LOW (ref 3.5–5.2)
Anion gap: 11 (ref 5–15)
BUN: 27 mg/dL — ABNORMAL HIGH (ref 6–23)
CALCIUM: 8.8 mg/dL (ref 8.4–10.5)
CO2: 29 meq/L (ref 19–32)
Chloride: 96 mEq/L (ref 96–112)
Creatinine, Ser: 1.13 mg/dL (ref 0.50–1.35)
GFR calc non Af Amer: 70 mL/min — ABNORMAL LOW (ref >90)
GFR, EST AFRICAN AMERICAN: 81 mL/min — AB (ref >90)
GLUCOSE: 100 mg/dL — AB (ref 70–99)
POTASSIUM: 4.4 meq/L (ref 3.7–5.3)
Sodium: 136 mEq/L — ABNORMAL LOW (ref 137–147)
Total Bilirubin: 0.5 mg/dL (ref 0.3–1.2)
Total Protein: 6.7 g/dL (ref 6.0–8.3)

## 2014-01-07 NOTE — Progress Notes (Signed)
[name] had some difficulty is over the weekend. He is having diarrhea. He had a temperature yesterday. Cultures were all taken. He is not on any antibiotics for right now.  It is possible that the temperature could be from his hepatic disease.  His appetite is still down a little bit. I do have him on Marinol. He is on this 3 times a day.  As for the chemotherapy itself, he acts he tolerated this pretty well.  I don't think that the diarrhea is from the chemotherapy. One possibility is if he does have a genetic mutation that increases the affects of 5-FU. DPD deficiency is a possibility. I would probably hold off on this for right now.  He's had no problems with his urine. He's had no problems with dizziness. He's had no headache.  His vital signs are stable right now. Temperature 98.9. Pulse 84. Blood pressure 121/79. Head and neck exam shows no ocular or oral lesions. Lungs are clear. Cardiac exam regular rate and rhythm with no murmurs, rubs or bruits. Abdomen is soft. It is somewhat distended. He has massive hepatomegaly. Extremities shows no clubbing, cyanosis or edema.  He has to be watched for the diarrhea right now. I really think that once his liver shrinks secondary to the cancer improving, then he will start to get better.  As always, I appreciate all the great care that he is getting on the floor from the staff and the hospitalist. Laurey Arrow E.   [place] 4:4

## 2014-01-07 NOTE — Progress Notes (Signed)
Patient ID: [name], male   DOB: [birth year], 58 y.o.   MRN: [id number] [facility] PROGRESS NOTE  [name] WUJ:[id number] DOB: Sep 14, 1955 DOA: 12/27/2013 PCP: No primary care provider on file.  Brief narrative:     58 year old male with past medical history of hypertension, dyslipidemia, history of CVA, aortic stenosis and repair of aortic aneurysm at [facility] who resented to [facility] ED with complaints of dizziness, lightheadedness, unintentional weight loss of more than 30 pounds, abdominal discomfort. A CT abdomen/pelvis was done and demonstrated a sigmoid mass with high suspicious for colon cancer with liver metastasis GI was consulted and a colonoscopy was obtained for tissue sample and reveled almost complete obstruction of colon. Surgery has placed stent for colonic mass and has placed port-a-cath. Per oncology, chemotherapy initiated 01/04/2014. Additionally, patient was found to have new acute and subacute cortical and subcortical CVA.   Assessment/Plan:    Principal Problem: Dizziness and lightheadedness / acute and subacute punctate cortical and subcortical stroke - as seen on MRI brain . Pt with risk factors including hypertension, dyslipidemia and evidence of prior strokes on MRI brain - continue aspirin daily for secondary stroke prevention - no focal neurologic deficits  - per PT eval - home health PT recommended   Active Problems: Abdominal mass with complete colon obstruction / elevated alkaline phosphatase level / abnormal liver function tests - likely colon cancer with liver and possible lung metastases as seen on CT abdomen - colonoscopy done 11/22 showed low and high grade dysplasia but no frank malignancy - patient is status post sigmoidoscopy with metal stenting to help with narrowing give risk for surgery and severe hepatomegaly--he isn't a surgical candidate - Chemotherapy started 01/04/2014. Port-A-Cath was placed 01/02/2014. - Abnormal liver function tests  likely secondary to hepatic metastasis  Leukocytosis - Likely stress demargination, no evidence of acute infection - pt did spike fever overnight and blood cultures were obtained. 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 Potassium remains within normal limits.  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  Family Communication: plan of care discussed with the patient Disposition Plan: Home when stable.    IV access:   PAC  Procedures and diagnostic studies:    Ct Chest W Contrast 12/31/2013 1. Nonspecific small pulmonary nodules. These are likely new since the remote exam of 07/20/2010. Early pulmonary metastasis cannot be excluded. 2. Status post right sided aortic stent graft repair, without acute complication. 3. Cardiomegaly and left ventricular hypertrophy. 4. Multiple liver masses with suggestion of cirrhosis. This could represent metastatic disease and/or hepatocellular carcinoma/carcinomas. 5. Upper abdominal ascites. 6. Gynecomastia.   Dg Chest Port 1 View 01/02/2014 No pneumothorax status post left internal jugular Port-A-Cath placement. Distal tip is seen overlying expected position of cavoatrial junction.   Dg Abd 2 Views 01/02/2014 1. There is no bowel obstruction demonstrated. A partially collapsed sigmoid colonic stent graft  is  present within the true bony pelvis. 2. Gallstones    Dg Abd 2 Views 01/01/2014 Stent placement for treatment of colonic mass.   Dg C-arm 61-120 Min 01/01/2014 Stent placement for treatment of colonic mass.  Dg Fluoro Guide Cv Line Left 01/04/2014 Intraoperative images from Port-A-Cath placement as described.   Dg Chest Port 1 View 01/06/2014   No evidence of acute abnormality.  Cardiomegaly with right-sided aortic arch stent graft.  Mild right basilar atelectasis/scarring again noted.    Medical Consultants:   GI  Neurology  Oncology   General surgery Other Consultants:   Physical therapy   Nutrition IAnti-Infectives:    None     Leisa Lenz, MD  [facility] Pager [phone]  If 7PM-7AM, please contact night-coverage [email or web address] Password [email or web address] 01/07/2014, 2:59 PM   LOS: 11 days    HPI/Subjective: No acute overnight events.  Objective: Filed Vitals:   01/06/14 2051 01/06/14 2221 01/07/14 0455 01/07/14 1431  BP: 110/82  121/79 139/93  Pulse: 103  84 86  Temp: 101.4 F (38.6 C) 99.3 F (37.4 C) 98.9 F (37.2 C) 98.7 F (37.1 C)  TempSrc: Oral  Oral Oral  Resp: 16  18 17   Height:      Weight:      SpO2: 98%  99% 99%    Intake/Output Summary (Last 24 hours) at 01/07/14 1459 Last data filed at 01/07/14 1432  Gross per 24 hour  Intake    720 ml  Output   1450 ml  Net   -730 ml    Exam:   General:  Pt is alert, follows commands appropriately, not in acute distress  Cardiovascular: Regular rate and rhythm, S1/S2, no murmurs  Respiratory: Clear to auscultation bilaterally, no wheezing, no crackles, no rhonchi  Abdomen: Soft, non tender, non distended, bowel sounds present  Extremities: No edema, pulses DP and PT palpable bilaterally  Neuro: Grossly nonfocal  Data Reviewed: Basic Metabolic Panel:  Recent Labs Lab 01/04/14 0515 01/04/14 0810 01/05/14 0450 01/06/14 0518 01/07/14 0500  NA 136* 134* 131* 134* 136*  K 4.2  4.4 4.7 4.5 4.4  CL 98 95* 91* 93* 96  CO2 27 25 26 27 29   GLUCOSE 93 138* 111* 108* 100*  BUN 13 14 18  24* 27*  CREATININE 1.00 0.98 1.01 1.20 1.13  CALCIUM 8.9 9.3 9.4 9.0 8.8   Liver Function Tests:  Recent Labs Lab 01/04/14 0810 01/05/14 0450 01/06/14 0518 01/07/14 0500  AST 83* 88* 150* 113*  ALT 17 18 36 31  ALKPHOS 285* 290* 357* 287*  BILITOT 0.4 0.3 0.5 0.5  PROT 7.9 7.9 7.3 6.7  ALBUMIN 2.6* 2.5* 2.4* 2.2*   No results for input(s): LIPASE, AMYLASE in the last 168 hours. No results for input(s): AMMONIA in the last 168 hours. CBC:  Recent Labs Lab 01/01/14 0543 01/04/14 0810 01/05/14 0450 01/06/14 0518 01/07/14 0500  WBC 14.0* 15.3* 17.7* 15.3* 12.2*  HGB 10.2* 11.2* 10.7* 9.7* 9.2*  HCT 31.0* 33.6* 33.1* 29.4* 28.4*  MCV 79.1 81.2 82.5 81.0 82.8  PLT 321 385 341 330 260   Cardiac Enzymes: No results for input(s): CKTOTAL, CKMB, CKMBINDEX, TROPONINI in the last 168 hours. BNP: Invalid input(s): POCBNP CBG:  Recent Labs Lab 01/06/14 1126 01/06/14 1725 01/06/14 2109 01/07/14 0728 01/07/14 1240  GLUCAP 117* 106* 121* 115* 97    Surgical pcr screen     Status: Abnormal   Collection Time: 01/02/14 10:12 AM  Result Value Ref Range Status  MRSA, PCR NEGATIVE NEGATIVE Final   Staphylococcus aureus POSITIVE (A) NEGATIVE Final     Scheduled Meds: . aspirin EC  325 mg Oral Daily  . citalopram  10 mg Oral Daily  . dronabinol  5 mg Oral TID AC  . feeding supplement   237 mL Oral TID BM  . isosorbide-hydrALAZINE  1 tablet Oral TID  . metoprolol succinate  50 mg Oral Daily  . nicotine  7 mg Transdermal Daily  . pantoprazole  40 mg Oral Daily  . pravastatin  20 mg Oral q1800   Continuous Infusions: . 0.9 % NaCl with KCl 40 mEq / L 50 mL/hr (01/07/14 1030)  . lactated ringers 50 mL/hr at 01/02/14 1224

## 2014-01-08 ENCOUNTER — Inpatient Hospital Stay (HOSPITAL_COMMUNITY): Payer: Medicaid Other

## 2014-01-08 DIAGNOSIS — R197 Diarrhea, unspecified: Secondary | ICD-10-CM

## 2014-01-08 DIAGNOSIS — R61 Generalized hyperhidrosis: Secondary | ICD-10-CM

## 2014-01-08 LAB — COMPREHENSIVE METABOLIC PANEL
ALBUMIN: 2.3 g/dL — AB (ref 3.5–5.2)
ALT: 28 U/L (ref 0–53)
AST: 97 U/L — AB (ref 0–37)
Alkaline Phosphatase: 307 U/L — ABNORMAL HIGH (ref 39–117)
Anion gap: 12 (ref 5–15)
BUN: 26 mg/dL — ABNORMAL HIGH (ref 6–23)
CALCIUM: 9.1 mg/dL (ref 8.4–10.5)
CO2: 27 mEq/L (ref 19–32)
CREATININE: 1.02 mg/dL (ref 0.50–1.35)
Chloride: 93 mEq/L — ABNORMAL LOW (ref 96–112)
GFR calc Af Amer: >90 mL/min (ref >90)
GFR calc non Af Amer: 79 mL/min — ABNORMAL LOW (ref >90)
Glucose, Bld: 100 mg/dL — ABNORMAL HIGH (ref 70–99)
Potassium: 4.1 mEq/L (ref 3.7–5.3)
Sodium: 132 mEq/L — ABNORMAL LOW (ref 137–147)
Total Bilirubin: 0.6 mg/dL (ref 0.3–1.2)
Total Protein: 7.2 g/dL (ref 6.0–8.3)

## 2014-01-08 LAB — URINE CULTURE
Colony Count: NO GROWTH
Culture: NO GROWTH

## 2014-01-08 LAB — CBC
HCT: 28 % — ABNORMAL LOW (ref 39.0–52.0)
Hemoglobin: 9.4 g/dL — ABNORMAL LOW (ref 13.0–17.0)
MCH: 27.1 pg (ref 26.0–34.0)
MCHC: 33.6 g/dL (ref 30.0–36.0)
MCV: 80.7 fL (ref 78.0–100.0)
PLATELETS: 288 10*3/uL (ref 150–400)
RBC: 3.47 MIL/uL — ABNORMAL LOW (ref 4.22–5.81)
RDW: 20.3 % — AB (ref 11.5–15.5)
WBC: 14 10*3/uL — ABNORMAL HIGH (ref 4.0–10.5)

## 2014-01-08 LAB — GLUCOSE, CAPILLARY
Glucose-Capillary: 103 mg/dL — ABNORMAL HIGH (ref 70–99)
Glucose-Capillary: 101 mg/dL — ABNORMAL HIGH (ref 70–99)

## 2014-01-08 MED ORDER — DIPHENOXYLATE-ATROPINE 2.5-0.025 MG PO TABS: 1.0000 | ORAL_TABLET | Freq: Four times a day (QID) | ORAL | Status: DC | PRN

## 2014-01-08 MED ORDER — SODIUM CHLORIDE 0.9 % IV SOLN
INTRAVENOUS | Status: AC
  Administered 2014-01-08: 14:00:00 via INTRAVENOUS

## 2014-01-08 MED ORDER — LOPERAMIDE HCL 2 MG PO CAPS: 2.0000 mg | ORAL_CAPSULE | Freq: Four times a day (QID) | ORAL | Status: DC | PRN

## 2014-01-08 NOTE — Care Management Note (Signed)
CARE MANAGEMENT NOTE 01/08/2014  Patient:  [name], [name]   Account Number:  [id number]  Date Initiated:  12/28/2013  Documentation initiated by:  Tomi Bamberger  Subjective/Objective Assessment:   dx lactic acidosis, tia  admit as observation- from home     Action/Plan:   11/20 on clears  pt eval- rec hhpt- pt has no insurance.   Anticipated DC Date:  01/08/2014   Anticipated DC Plan:  [place]  CM consult  Follow-up appt scheduled      [facility] Choice  HOME HEALTH   Choice offered to / List presented to:  C-1 Patient        [place] arranged  HH-1 RN      [place].   Status of service:  In process, will continue to follow Medicare Important Message given?  NO (If response is "NO", the following Medicare IM given date fields will be blank) Date Medicare IM given:   Medicare IM given by:   Date Additional Medicare IM given:   Additional Medicare IM given by:    Discharge Disposition:    Per UR Regulation:  Reviewed for med. necessity/level of care/duration of stay  If discussed at [place] of Stay Meetings, dates discussed:   01/08/2014    Comments:  01/08/14 Marney Doctor RN,BSN,NCM [phone] PT is recommending HHPT.  Will need MD orders for [facility] for safety eval due to pt being self pay.  MD informed.  CM will continue to follow.  12/31/13 [place] BSN [phone] patient has apt scheduled on 12/4 at 1:30 at [facility] on [facility].  He has a orange card for meds as well.  12/28/13 [place], BSN [phone] NCM spoke with patient he has an orange card  for his med, he goes to [facility] on [address].

## 2014-01-08 NOTE — Progress Notes (Signed)
[name] is still having diarrhea. He said he went 4 or 5 times yesterday. I suppose this still might be a residual effect from when he had the stent placed. I will check an abdominal x-ray on him to see how that looks. I will also check C. difficile on him.  He has had some sweats. This might be from his liver metastasis. He's had no temperature. Cultures, so far, have been negative.  He's not complaining of any pain.  He's had no bleeding. He's had no leg swelling. He's had no cough.   On his physical exam, he is afebrile. His blood pressure is 137/74. Pulse is 85. Head and neck exam shows no mucositis. He has no adenopathy in the neck. Lungs are clear. Cardiac exam regular rate and rhythm with a 3/6 systolic ejection murmur. Abdomen is somewhat distended from his hepatomegaly. There is no guarding. His bowel sounds are active. Extremities shows no clubbing, cyanosis or edema.  Labs shows potassium to be 4.1. His liver tests are about the same. Albumin is 2.3. Hemoglobin 9.4. White cell count 14.  We will see what the abdominal x-rays show. I will try a little bit of Lomotil on him. I will still check him for C. difficile. I would not think that he would have this.  Once the diarrhea is improved, then he can certainly go home.  I appreciate all the great work and great care that he is getting from everybody on [place] 2:13

## 2014-01-08 NOTE — Progress Notes (Signed)
Patient ID: [name], male   DOB: [birth year], 58 y.o.   MRN: [id number] [facility] PROGRESS NOTE  [name] JGO:[id number] DOB: Feb 04, 1956 DOA: 12/27/2013 PCP: No primary care provider on file.  Brief narrative:    58 year old male with past medical history of hypertension, dyslipidemia, history of CVA, aortic stenosis and repair of aortic aneurysm at [facility] who resented to [facility] ED with complaints of dizziness, lightheadedness, unintentional weight loss of more than 30 pounds, abdominal discomfort. A CT abdomen/pelvis was done and demonstrated a sigmoid mass with high suspicious for colon cancer with liver metastasis GI was consulted and a colonoscopy was obtained for tissue sample and reveled almost complete obstruction of colon. Surgery has placed stent for colonic mass and has placed port-a-cath. Per oncology, chemotherapy initiated 01/04/2014. Additionally, patient was found to have new acute and subacute cortical and subcortical CVA. Barrier to discharge is ongoing diarrhea.  Assessment/Plan:     Principal Problem: Dizziness and lightheadedness / acute and subacute punctate cortical and subcortical stroke - as seen on MRI brain . Pt with risk factors including hypertension, dyslipidemia and evidence of prior strokes on MRI brain - continue aspirin daily for secondary stroke prevention - no focal neurologic deficits  - per PT eval - home health PT recommended   Active Problems: Metastatic colon cancer / sigmoid colon mass and liver metastasis / abnormal liver function enzymes - Findings highly suspicious for colon cancer with liver metastasis. Please note patient had colonoscopy on 12/30/2013 and pathology results showed high-grade dysplasia but no frank malignancy. - Patient is status post sigmoidoscopy with metal stenting to help with narrowing give risk for surgery and severe hepatomegaly--he isn't a surgical candidate - Chemotherapy with FOLFOX started 01/04/2014. Port-A-Cath  was placed 01/02/2014. - Abnormal liver function tests likely secondary to hepatic metastasis - Appreciate oncology following an their recommendations  Diarrhea - Patient developed diarrhea after chemotherapy. He had some blood with diarrhea. No further reports of bleeding but patient continues to have diarrhea. Abdominal x-ray this morning shows stent placed in the distal sigmoid colon with no evidence of bowel obstruction. - Per oncology recommendations we added Lomotil and Imodium as needed to help with diarrhea. - C. difficile is pending. Continue contact precaution.  Leukocytosis - Likely stress demargination, no evidence of acute infection - pt did spike fever overnight 11/29 to 11/30 and blood cultures were obtained. No clear source of infection so abx not started.  Blood cultures so far show no growth. - WBC count ranges 12.2 - 15.3.   Anemia of chronic disease - secondary to malignancy and sequela of chemotherapy. - hemoglobin is 9.4 today - No current indications for transfusion. Continue to monitor for bleeding. As noted above, patient did have some blood in the stool but it has resolved. He is on low-dose aspirin. If there is further bleed we may want to hold aspirin temporarily. - We stopped Lovenox and changed to SCDs for DVT prophylaxis.  Hypokalemia - unclear etiology - Potassium supplemented. Potassium remains within normal limits.  Hyponatremia - Likely secondary to GI losses, dehydration - Sodium level  132 this morning. - Continue IV fluids, rate reduced to 50 mL an hour  Essential hypertension  - continue bidil, metoprolol  Dyslipidemia - started Pravachol 20 mg daily   Depression and insomnia - due to newly diagnosed malignancy - continue celexa and trazodone  Severe protein calorie malnutrition - continue feeding supplementation, Marinol TID - nutrition consulted   DVT Prophylaxis  - stopped  Lovenox and using SCD's for DVT prophylaxis   Code  Status: Full.  Family Communication: plan of care discussed with the patient Disposition Plan: Home when stable.    IV access:   Peripheral IV  Procedures and diagnostic studies:    Ct Chest W Contrast 12/31/2013 1. Nonspecific small pulmonary nodules. These are likely new since the remote exam of 07/20/2010. Early pulmonary metastasis cannot be excluded. 2. Status post right sided aortic stent graft repair, without acute complication. 3. Cardiomegaly and left ventricular hypertrophy. 4. Multiple liver masses with suggestion of cirrhosis. This could represent metastatic disease and/or hepatocellular carcinoma/carcinomas. 5. Upper abdominal ascites. 6. Gynecomastia.   Dg Chest Port 1 View 01/02/2014 No pneumothorax status post left internal jugular Port-A-Cath placement. Distal tip is seen overlying expected position of cavoatrial junction.   Dg Abd 2 Views 01/02/2014 1. There is no bowel obstruction demonstrated. A partially collapsed sigmoid colonic stent graft is present within the true bony pelvis. 2. Gallstones    Dg Abd 2 Views 01/01/2014 Stent placement for treatment of colonic mass.   Dg C-arm 61-120 Min 01/01/2014 Stent placement for treatment of colonic mass.  Dg Fluoro Guide Cv Line Left 01/04/2014 Intraoperative images from Port-A-Cath placement as described.   Dg Chest Port 1 View 01/06/2014 No evidence of acute abnormality. Cardiomegaly with right-sided aortic arch stent graft. Mild right basilar atelectasis/scarring again noted.   Dg Abd 2 Views 01/08/2014   1. Stent placed in the distal sigmoid colon, as above. No evidence of bowel obstruction. 2. No pneumoperitoneum. 3. Cholelithiasis. 4. Hepatomegaly. 5. Additional incidental findings, as above.      Medical Consultants:   Dr. Burney Gauze, Oncology   GI  Neurology  General surgery  Other Consultants:   Physical therapy   Nutrition  IAnti-Infectives:    None    Leisa Lenz,  MD  [facility] Pager [phone]  If 7PM-7AM, please contact night-coverage [email or web address] Password [email or web address] 01/08/2014, 9:21 AM   LOS: 12 days    HPI/Subjective: No acute overnight events.  Objective: Filed Vitals:   01/07/14 0455 01/07/14 1431 01/07/14 2138 01/08/14 0556  BP: 121/79 139/93 113/78 137/74  Pulse: 84 86 95 85  Temp: 98.9 F (37.2 C) 98.7 F (37.1 C) 99.6 F (37.6 C) 99.1 F (37.3 C)  TempSrc: Oral Oral Oral Oral  Resp: 18 17 18 18   Height:      Weight:      SpO2: 99% 99% 93% 99%    Intake/Output Summary (Last 24 hours) at 01/08/14 8341 Last data filed at 01/08/14 0800  Gross per 24 hour  Intake 7732.5 ml  Output   2225 ml  Net 5507.5 ml    Exam:   General:  Pt is alert, follows commands appropriately, not in acute distress  Cardiovascular: Regular rate and rhythm, S1/S2, no murmurs  Respiratory: Clear to auscultation bilaterally, no wheezing, no crackles, no rhonchi  Abdomen: non tender, bowel sounds present  Extremities: No edema, pulses DP and PT palpable bilaterally  Neuro: Grossly nonfocal  Data Reviewed: Basic Metabolic Panel:  Recent Labs Lab 01/04/14 0810 01/05/14 0450 01/06/14 0518 01/07/14 0500 01/08/14 0500  NA 134* 131* 134* 136* 132*  K 4.4 4.7 4.5 4.4 4.1  CL 95* 91* 93* 96 93*  CO2 25 26 27 29 27   GLUCOSE 138* 111* 108* 100* 100*  BUN 14 18 24* 27* 26*  CREATININE 0.98 1.01 1.20 1.13 1.02  CALCIUM 9.3 9.4 9.0 8.8 9.1   Liver Function Tests:  Recent Labs Lab 01/04/14 0810 01/05/14 0450 01/06/14 0518 01/07/14 0500 01/08/14 0500  AST 83* 88* 150* 113* 97*  ALT 17 18 36 31 28  ALKPHOS 285* 290* 357* 287* 307*  BILITOT 0.4 0.3 0.5 0.5 0.6  PROT 7.9 7.9 7.3 6.7 7.2  ALBUMIN 2.6* 2.5* 2.4* 2.2* 2.3*   No results for input(s): LIPASE, AMYLASE in the last 168 hours. No results for input(s): AMMONIA in the last 168 hours. CBC:  Recent Labs Lab 01/04/14 0810 01/05/14 0450 01/06/14 0518 01/07/14 0500  01/08/14 0500  WBC 15.3* 17.7* 15.3* 12.2* 14.0*  HGB 11.2* 10.7* 9.7* 9.2* 9.4*  HCT 33.6* 33.1* 29.4* 28.4* 28.0*  MCV 81.2 82.5 81.0 82.8 80.7  PLT 385 341 330 260 288   Cardiac Enzymes: No results for input(s): CKTOTAL, CKMB, CKMBINDEX, TROPONINI in the last 168 hours. BNP: Invalid input(s): POCBNP CBG:  Recent Labs Lab 01/06/14 2109 01/07/14 0728 01/07/14 1240 01/07/14 2124 01/08/14 0734  GLUCAP 121* 115* 97 111* 103*    Recent Results (from the past 240 hour(s))  Surgical pcr screen     Status: Abnormal   Collection Time: 01/02/14 10:12 AM  Result Value Ref Range Status   MRSA, PCR NEGATIVE NEGATIVE Final   Staphylococcus aureus POSITIVE (A) NEGATIVE Final    Comment:        The Xpert SA Assay (FDA approved for NASAL specimens in patients over 34 years of age), is one component of a comprehensive surveillance program.  Test performance has been validated by [employer] for patients greater than or equal to 49 year old. It is not intended to diagnose infection nor to guide or monitor treatment.   Culture, blood (routine x 2)     Status: None (Preliminary result)   Collection Time: 01/06/14  9:45 PM  Result Value Ref Range Status   Specimen Description BLOOD R ARM  Final   Special Requests BOTTLES DRAWN AEROBIC AND ANAEROBIC 10 CC EACH  Final   Culture  Setup Time   Final    01/07/2014 01:17 Performed at [facility]    Culture   Final           BLOOD CULTURE RECEIVED NO GROWTH TO DATE CULTURE WILL BE HELD FOR 5 DAYS BEFORE ISSUING A FINAL NEGATIVE REPORT Performed at [facility]    Report Status PENDING  Incomplete  Culture, blood (routine x 2)     Status: None (Preliminary result)   Collection Time: 01/06/14  9:50 PM  Result Value Ref Range Status   Specimen Description BLOOD L ARM  Final   Special Requests BOTTLES DRAWN AEROBIC AND ANAEROBIC 10CC EACH  Final   Culture  Setup Time   Final    01/07/2014 01:17 Performed at [facility]    Culture   Final           BLOOD CULTURE RECEIVED NO GROWTH TO DATE CULTURE WILL BE HELD FOR 5 DAYS BEFORE ISSUING A FINAL NEGATIVE REPORT Performed at [facility]    Report Status PENDING  Incomplete     Scheduled Meds: . aspirin EC  325 mg Oral Daily  . citalopram  10 mg Oral Daily  . dronabinol  5 mg Oral TID AC  . feeding supplement (ENSURE COMPLETE)  237 mL Oral TID BM  . isosorbide-hydrALAZINE  1 tablet Oral TID  . metoprolol succinate  50 mg Oral Daily  . nicotine  7 mg Transdermal Daily  . pantoprazole  40 mg Oral Daily  . pravastatin  20 mg Oral q1800   Continuous Infusions: . 0.9 % NaCl with KCl 40 mEq / L 50 mL/hr (01/08/14 0554)

## 2014-01-09 DIAGNOSIS — Z22322 Carrier or suspected carrier of Methicillin resistant Staphylococcus aureus: Secondary | ICD-10-CM

## 2014-01-09 DIAGNOSIS — R7989 Other specified abnormal findings of blood chemistry: Secondary | ICD-10-CM

## 2014-01-09 LAB — CBC
HEMATOCRIT: 26.6 % — AB (ref 39.0–52.0)
Hemoglobin: 8.6 g/dL — ABNORMAL LOW (ref 13.0–17.0)
MCH: 26.8 pg (ref 26.0–34.0)
MCHC: 32.3 g/dL (ref 30.0–36.0)
MCV: 82.9 fL (ref 78.0–100.0)
Platelets: 281 10*3/uL (ref 150–400)
RBC: 3.21 MIL/uL — ABNORMAL LOW (ref 4.22–5.81)
RDW: 20 % — AB (ref 11.5–15.5)
WBC: 10.8 10*3/uL — AB (ref 4.0–10.5)

## 2014-01-09 LAB — COMPREHENSIVE METABOLIC PANEL
ALBUMIN: 2.2 g/dL — AB (ref 3.5–5.2)
ALT: 23 U/L (ref 0–53)
AST: 86 U/L — ABNORMAL HIGH (ref 0–37)
Alkaline Phosphatase: 289 U/L — ABNORMAL HIGH (ref 39–117)
Anion gap: 14 (ref 5–15)
BUN: 28 mg/dL — ABNORMAL HIGH (ref 6–23)
CO2: 27 mEq/L (ref 19–32)
Calcium: 8.7 mg/dL (ref 8.4–10.5)
Chloride: 94 mEq/L — ABNORMAL LOW (ref 96–112)
Creatinine, Ser: 1.13 mg/dL (ref 0.50–1.35)
GFR calc Af Amer: 81 mL/min — ABNORMAL LOW (ref >90)
GFR calc non Af Amer: 70 mL/min — ABNORMAL LOW (ref >90)
Glucose, Bld: 108 mg/dL — ABNORMAL HIGH (ref 70–99)
Potassium: 3.9 mEq/L (ref 3.7–5.3)
Sodium: 135 mEq/L — ABNORMAL LOW (ref 137–147)
Total Bilirubin: 0.5 mg/dL (ref 0.3–1.2)
Total Protein: 6.9 g/dL (ref 6.0–8.3)

## 2014-01-09 LAB — PREPARE RBC (CROSSMATCH)

## 2014-01-09 LAB — CLOSTRIDIUM DIFFICILE BY PCR: Toxigenic C. Difficile by PCR: NEGATIVE

## 2014-01-09 MED ORDER — ACETAMINOPHEN 325 MG PO TABS
325.0000 mg | ORAL_TABLET | Freq: Once | ORAL | Status: AC
  Administered 2014-01-09: 325 mg via ORAL
  Filled 2014-01-09: qty 1

## 2014-01-09 MED ORDER — SODIUM CHLORIDE 0.9 % IV SOLN
Freq: Once | INTRAVENOUS | Status: AC
Start: 2014-01-09 — End: 2014-01-09
  Administered 2014-01-09: 250 mL via INTRAVENOUS

## 2014-01-09 MED ORDER — FUROSEMIDE 10 MG/ML IJ SOLN
20.0000 mg | Freq: Once | INTRAMUSCULAR | Status: AC
  Administered 2014-01-09: 20 mg via INTRAVENOUS
  Filled 2014-01-09: qty 2

## 2014-01-09 NOTE — Plan of Care (Signed)
Problem: Phase II Progression Outcomes Goal: Progress activity as tolerated unless otherwise ordered Outcome: Progressing     

## 2014-01-09 NOTE — Progress Notes (Signed)
The diarrhea is getting a little better. The C. difficile still pending. Cultures have all been negative. His abdominal x-ray showed no obstruction. There is no ileus.  He still does not have much taste for food. He is on Marinol.  His hemoglobin is 8.6. I will go ahead and transfuse him. I think he will need this.  He's not complaining of any pain. Per his been no mouth sores.  He's had no rashes.  All his vital signs are pretty stable. His temperature is 99.8. Blood pressure 126/84. Pulse is 89. Head and exam shows no scleral icterus. There is no oral lesion. He has no adenopathy in the neck. Lungs are clear. Cardiac exam regular in rhythm. Is a 3/6 systolic ejection murmur. Abdomen is slightly distended secondary to hepatomegaly. He has good bowel sounds. Extremities shows no clubbing, cyanosis or edema. Skin exam shows no rashes. Neurological exam is non-focal.  Again, we will transfuse him 2 units. I think this will help. We will see what the C. difficile titer is.  Hopefully, the diarrhea will continue to improve.  If all goes well today, then maybe he can go home tomorrow.  I do appreciate all the great help that he is getting from everybody on the floor.  [name]

## 2014-01-09 NOTE — Progress Notes (Signed)
Released second unit of blood to be transfused. Informed by blood bank error was made and unit not ready to be given yet.

## 2014-01-09 NOTE — Progress Notes (Signed)
Progress Note   [name] QIW:[id number] DOB: 05/31/1955 DOA: 12/27/2013 PCP: No primary care provider on file.   Brief Narrative:   [name] is an 58 y.o. male with a PMH of hypertension, dyslipidemia, history of CVA, aortic stenosis and repair of aortic aneurysm at [facility] who was admitted 12/27/13 with a chief complaint of dizziness, lightheadedness, unintentional weight loss of more than 30 pounds, abdominal discomfort. A CT abdomen/pelvis was done and demonstrated a sigmoid mass  suspicious for colon cancer with liver metastasis GI was consulted and a colonoscopy was obtained for tissue sample and revealed almost complete obstruction of colon. Surgery has placed stent for colonic mass and has placed port-a-cath.  Chemotherapy was initiated 01/04/2014. Additionally, patient was found to have new acute and subacute cortical and subcortical CVA. Barrier to discharge is ongoing diarrhea.  Assessment/Plan:    Principal Problem: Acute and subacute punctate cortical and subcortical stroke with dizziness and lightheadedness/cerebral embolism with cerebral infarction  Diagnosed 12/28/13 on MRI. Pt with risk factors including hypertension, dyslipidemia and evidence of prior strokes on MRI brain.  Seen by neurologist 12/28/13 with further recommendations for echocardiogram/carotid Dopplers.  Continue aspirin daily for secondary stroke prevention.  No focal neurologic deficitsappreciated.  Unfortunately, the patient is uninsured and will not be able to get home PT. We'll set up home health RN for safety evaluation.   Active Problems: MRSA carrier  Decontamination therapy ordered. On contact isolation.  Acute kidney injury  Creatinine 1.56 on admission, normalized with IV fluids.  Metastatic colon cancer / sigmoid colon mass and liver metastasis / abnormal liver function enzymes / hepatomegaly / intestinal obstruction  Status post colonoscopy on 12/30/2013 and pathology results  showed high-grade dysplasia but no frank malignancy.  CT scan of abdomen/pelvis done on admission demonstrating a sigmoid mass suspicious for colon cancer with liver metastasis.  Elevated LFTs felt to be secondary to hepatic metastasis.  Patient is status post sigmoidoscopy with metal stenting to help with narrowing give risk for surgery and severe hepatomegaly--he isn't a surgical candidate.  Chemotherapy with FOLFOX started 01/04/2014 per Dr. [name]. Port-A-Cath was placed 01/02/2014.  Diarrhea  Patient developed diarrhea after chemotherapy. He had some blood with diarrhea. No further reports of bleeding but patient continues to have diarrhea. Abdominal x-ray 01/08/14 showed stent placed in the distal sigmoid colon with no evidence of bowel obstruction.  Per oncology recommendations we added Lomotil and Imodium as needed to help with diarrhea.  C. difficile negative.   Leukocytosis  Likely stress demargination, no evidence of acute infection  Blood cultures so far show no growth.  WBC trending down.  Anemia of chronic disease  Secondary to malignancy and sequela of chemotherapy.  For 2 units of PRBCs per Dr. [name].  Hypokalemia  Likely from GI losses. Continue to monitor and supplement.  Hyponatremia  Mild. Continue gentle IV fluids.  Essential hypertension   Continue bidil, metoprolol. Blood pressure controlled.  Dyslipidemia  Continue Pravachol 20 mg daily.   Depression and insomnia  Continue celexa and trazodone   Severe protein calorie malnutrition/loss of weight  Continue feeding supplementation, Marinol TID.  Dietitian consulted and recommendations noted.  Lactic acidosis   Elevated serum lactate noted on admission, resolved.  DVT Prophylaxis   Continue SCDs.    Code Status: Full. Family Communication: No family at bedside. Disposition Plan: Home when stable.   IV Access:    Port-A-Cath   Procedures and diagnostic studies:   Ct  Chest W Contrast 12/31/2013  1. Nonspecific small pulmonary nodules. These are likely new since the remote exam of 07/20/2010. Early pulmonary metastasis cannot be excluded. 2. Status post right sided aortic stent graft repair, without acute complication. 3. Cardiomegaly and left ventricular hypertrophy. 4. Multiple liver masses with suggestion of cirrhosis. This could represent metastatic disease and/or hepatocellular carcinoma/carcinomas. 5. Upper abdominal ascites. 6. Gynecomastia.   Dg Chest Port 1 View 01/02/2014 No pneumothorax status post left internal jugular Port-A-Cath placement. Distal tip is seen overlying expected position of cavoatrial junction.   Dg Abd 2 Views 01/02/2014 1. There is no bowel obstruction demonstrated. A partially collapsed sigmoid colonic stent graft is present within the true bony pelvis. 2. Gallstones    Dg Abd 2 Views 01/01/2014 Stent placement for treatment of colonic mass.   Dg C-arm 61-120 Min 01/01/2014 Stent placement for treatment of colonic mass.  Dg Fluoro Guide Cv Line Left 01/04/2014 Intraoperative images from Port-A-Cath placement as described.   Dg Chest Port 1 View 01/06/2014 No evidence of acute abnormality. Cardiomegaly with right-sided aortic arch stent graft. Mild right basilar atelectasis/scarring again noted.   Dg Abd 2 Views 01/08/2014 1. Stent placed in the distal sigmoid colon, as above. No evidence of bowel obstruction. 2. No pneumoperitoneum. 3. Cholelithiasis. 4. Hepatomegaly. 5. Additional incidental findings, as above.   Medical Consultants:    Dr. Burney Gauze, Oncology  Dr. Delfin Edis, GI.  Dr. Jim Like, Neurology  Dr. Greer Pickerel, Surgery  Anti-Infectives:    None.  Subjective:   [name] feels pretty good.  Feels tired.  No dizziness.  Diarrhea improved.  No complaints of pain.  No complaints of focal motor deficits.  Ambulating slowly.    Objective:    Filed Vitals:   01/08/14  0556 01/08/14 1422 01/08/14 2051 01/09/14 0450  BP: 137/74 140/90 126/84   Pulse: 85 82 90 89  Temp: 99.1 F (37.3 C) 98 F (36.7 C) 98.4 F (36.9 C) 99.8 F (37.7 C)  TempSrc: Oral Oral Oral Oral  Resp: 18 16 16 20   Height:      Weight:      SpO2: 99% 100% 100% 99%    Intake/Output Summary (Last 24 hours) at 01/09/14 0810 Last data filed at 01/09/14 0745  Gross per 24 hour  Intake   1920 ml  Output   1801 ml  Net    119 ml    Exam: Gen:  NAD Cardiovascular:  RRR, III/VI murmur Respiratory:  Lungs CTAB Gastrointestinal:  Abdomen soft, slightly distended, + BS Extremities:  No C/E/C   Data Reviewed:    Labs: Basic Metabolic Panel:  Recent Labs Lab 01/05/14 0450 01/06/14 0518 01/07/14 0500 01/08/14 0500 01/09/14 0530  NA 131* 134* 136* 132* 135*  K 4.7 4.5 4.4 4.1 3.9  CL 91* 93* 96 93* 94*  CO2 26 27 29 27 27   GLUCOSE 111* 108* 100* 100* 108*  BUN 18 24* 27* 26* 28*  CREATININE 1.01 1.20 1.13 1.02 1.13  CALCIUM 9.4 9.0 8.8 9.1 8.7   GFR Estimated Creatinine Clearance: 71.1 mL/min (by C-G formula based on Cr of 1.13). Liver Function Tests:  Recent Labs Lab 01/05/14 0450 01/06/14 0518 01/07/14 0500 01/08/14 0500 01/09/14 0530  AST 88* 150* 113* 97* 86*  ALT 18 36 31 28 23   ALKPHOS 290* 357* 287* 307* 289*  BILITOT 0.3 0.5 0.5 0.6 0.5  PROT 7.9 7.3 6.7 7.2 6.9  ALBUMIN 2.5* 2.4* 2.2* 2.3* 2.2*    CBC:  Recent Labs  Lab 01/05/14 0450 01/06/14 0518 01/07/14 0500 01/08/14 0500 01/09/14 0530  WBC 17.7* 15.3* 12.2* 14.0* 10.8*  HGB 10.7* 9.7* 9.2* 9.4* 8.6*  HCT 33.1* 29.4* 28.4* 28.0* 26.6*  MCV 82.5 81.0 82.8 80.7 82.9  PLT 341 330 260 288 281   CBG:  Recent Labs Lab 01/07/14 0728 01/07/14 1240 01/07/14 2124 01/08/14 0734 01/08/14 1235  GLUCAP 115* 55 111* 103* 101*    Microbiology Recent Results (from the past 240 hour(s))  Surgical pcr screen     Status: Abnormal   Collection Time: 01/02/14 10:12 AM  Result Value Ref  Range Status   MRSA, PCR NEGATIVE NEGATIVE Final   Staphylococcus aureus POSITIVE (A) NEGATIVE Final    Comment:        The Xpert SA Assay (FDA approved for NASAL specimens in patients over 71 years of age), is one component of a comprehensive surveillance program.  Test performance has been validated by [employer] for patients greater than or equal to 20 year old. It is not intended to diagnose infection nor to guide or monitor treatment.   Culture, blood (routine x 2)     Status: None (Preliminary result)   Collection Time: 01/06/14  9:45 PM  Result Value Ref Range Status   Specimen Description BLOOD R ARM  Final   Special Requests BOTTLES DRAWN AEROBIC AND ANAEROBIC 10 CC EACH  Final   Culture  Setup Time   Final    01/07/2014 01:17 Performed at [facility]    Culture   Final           BLOOD CULTURE RECEIVED NO GROWTH TO DATE CULTURE WILL BE HELD FOR 5 DAYS BEFORE ISSUING A FINAL NEGATIVE REPORT Performed at [facility]    Report Status PENDING  Incomplete  Culture, blood (routine x 2)     Status: None (Preliminary result)   Collection Time: 01/06/14  9:50 PM  Result Value Ref Range Status   Specimen Description BLOOD L ARM  Final   Special Requests BOTTLES DRAWN AEROBIC AND ANAEROBIC 10CC EACH  Final   Culture  Setup Time   Final    01/07/2014 01:17 Performed at [facility]    Culture   Final           BLOOD CULTURE RECEIVED NO GROWTH TO DATE CULTURE WILL BE HELD FOR 5 DAYS BEFORE ISSUING A FINAL NEGATIVE REPORT Performed at [facility]    Report Status PENDING  Incomplete  Culture, Urine     Status: None   Collection Time: 01/06/14 10:50 PM  Result Value Ref Range Status   Specimen Description URINE, CLEAN CATCH  Final   Special Requests NONE  Final   Culture  Setup Time   Final    01/07/2014 08:59 Performed at [place] Performed at [facility]   Final   Culture NO  GROWTH Performed at [facility]   Final   Report Status 01/08/2014 FINAL  Final     Medications:   . sodium chloride   Intravenous Once  . aspirin EC  325 mg Oral Daily  . citalopram  10 mg Oral Daily  . dronabinol  5 mg Oral TID AC  . feeding supplement (ENSURE COMPLETE)  237 mL Oral TID BM  . furosemide  20 mg Intravenous Once  . isosorbide-hydrALAZINE  1 tablet Oral TID  . metoprolol succinate  50 mg Oral Daily  .  nicotine  7 mg Transdermal Daily  . pantoprazole  40 mg Oral Daily  . pravastatin  20 mg Oral q1800   Continuous Infusions:   Time spent: 25 minutes.   LOS: 13 days   [facility] Pager [phone]. If unable to reach me by pager, please call my cell phone at [phone].  *Please refer to [email or web address], password [email or web address] to get updated schedule on who will round on this patient, as hospitalists switch teams weekly. If 7PM-7AM, please contact night-coverage at [email or web address], password [email or web address] for any overnight needs.  01/09/2014, 8:10 AM

## 2014-01-10 DIAGNOSIS — R748 Abnormal levels of other serum enzymes: Secondary | ICD-10-CM

## 2014-01-10 DIAGNOSIS — E876 Hypokalemia: Secondary | ICD-10-CM

## 2014-01-10 DIAGNOSIS — I6340 Cerebral infarction due to embolism of unspecified cerebral artery: Secondary | ICD-10-CM

## 2014-01-10 DIAGNOSIS — D630 Anemia in neoplastic disease: Secondary | ICD-10-CM

## 2014-01-10 DIAGNOSIS — R197 Diarrhea, unspecified: Secondary | ICD-10-CM | POA: Insufficient documentation

## 2014-01-10 DIAGNOSIS — R160 Hepatomegaly, not elsewhere classified: Secondary | ICD-10-CM

## 2014-01-10 DIAGNOSIS — C187 Malignant neoplasm of sigmoid colon: Secondary | ICD-10-CM

## 2014-01-10 DIAGNOSIS — D72829 Elevated white blood cell count, unspecified: Secondary | ICD-10-CM

## 2014-01-10 DIAGNOSIS — E46 Unspecified protein-calorie malnutrition: Secondary | ICD-10-CM

## 2014-01-10 DIAGNOSIS — D500 Iron deficiency anemia secondary to blood loss (chronic): Secondary | ICD-10-CM

## 2014-01-10 DIAGNOSIS — R109 Unspecified abdominal pain: Secondary | ICD-10-CM

## 2014-01-10 LAB — BASIC METABOLIC PANEL
Anion gap: 15 (ref 5–15)
BUN: 29 mg/dL — AB (ref 6–23)
CO2: 28 mEq/L (ref 19–32)
Calcium: 8.8 mg/dL (ref 8.4–10.5)
Chloride: 95 mEq/L — ABNORMAL LOW (ref 96–112)
Creatinine, Ser: 1.18 mg/dL (ref 0.50–1.35)
GFR, EST AFRICAN AMERICAN: 77 mL/min — AB (ref >90)
GFR, EST NON AFRICAN AMERICAN: 66 mL/min — AB (ref >90)
Glucose, Bld: 97 mg/dL (ref 70–99)
Potassium: 4 mEq/L (ref 3.7–5.3)
SODIUM: 138 meq/L (ref 137–147)

## 2014-01-10 LAB — CBC
HEMATOCRIT: 31.1 % — AB (ref 39.0–52.0)
Hemoglobin: 10.5 g/dL — ABNORMAL LOW (ref 13.0–17.0)
MCH: 27.3 pg (ref 26.0–34.0)
MCHC: 33.8 g/dL (ref 30.0–36.0)
MCV: 80.8 fL (ref 78.0–100.0)
Platelets: 297 10*3/uL (ref 150–400)
RBC: 3.85 MIL/uL — ABNORMAL LOW (ref 4.22–5.81)
RDW: 19 % — AB (ref 11.5–15.5)
WBC: 13.2 10*3/uL — ABNORMAL HIGH (ref 4.0–10.5)

## 2014-01-10 LAB — TYPE AND SCREEN
ABO/RH(D): A POS
ANTIBODY SCREEN: POSITIVE
DAT, IGG: NEGATIVE
UNIT DIVISION: 0
Unit division: 0

## 2014-01-10 MED ORDER — SODIUM CHLORIDE 0.9 % IV SOLN
1020.0000 mg | Freq: Once | INTRAVENOUS | Status: AC
  Administered 2014-01-10: 1020 mg via INTRAVENOUS
  Filled 2014-01-10: qty 34

## 2014-01-10 MED ORDER — DIPHENOXYLATE-ATROPINE 2.5-0.025 MG PO TABS
2.0000 | ORAL_TABLET | Freq: Four times a day (QID) | ORAL | Status: DC
Start: 2014-01-10 — End: 2014-01-12
  Administered 2014-01-10 – 2014-01-11 (×7): 2 via ORAL
  Filled 2014-01-10 (×7): qty 2

## 2014-01-10 NOTE — Progress Notes (Signed)
[name] is still having some diarrhea. Is not all that bad area and I don't these have and that much daily. His C. difficile is negative. He says he ate more yesterday. There was no nausea or vomiting.  He does have some abdominal distention. I told him that this is his liver that has the cancer in it.  He's had no fever. All cultures are negative.  His hemoglobin is 10.5. I will give him one more dose of iron. I think this will help him. It may help with the diarrhea a little bit.  He denies any kind of dizziness. He is walking a little bit more.  He's not complaining much in the way of pain.  His been no leg swelling.  All this vital signs look good. He is a febrile. His blood pressure is 135/95. His oral exam shows no mucositis. He has no adenopathy in the neck. Lungs are clear. Cardiac exam regular in rhythm is a 3/6 systolic murmur. Abdomen is somewhat distended. He has hepatomegaly. Bowel sounds are slightly decreased. There is no fluid wave. There is no obvious splenomegaly. Extremities shows no clubbing, cyanosis or edema.  I would like to think that one more day of his all that he will need. Again, it doesn't seem that the diarrhea is that bad. He does not look like he is dehydrated.  I very much appreciate all the great care that he is getting.  Again, I would like to hope that he will be able to go home tomorrow, if not today depending on the diarrhea.  Pete e.  Proverbs 13:20

## 2014-01-10 NOTE — Progress Notes (Signed)
NUTRITION FOLLOW UP   Intervention:   -Continue with Ensure Complete po TID (noted to pt that supplement may contribute to loose stools; however, pt still desires to consume three/day vs two/day) -Continue Lomotil -Continue Dronabinol TID -RD to monitor  Nutrition Dx:   Inadequate oral intake related to poor appetite as evidenced by wt loss.   Goal:   Pt to meet >/= 90% of their estimated nutrition needs   Monitor:   Weight trend, po intake, acceptance of supplements, labs  Assessment:   58 y.o. male with Past medical history of hypertension, BPH. The patient is presenting with complaints of dizziness.  Pt has been advanced to a full liquid diet. Intake fair; 50% meal completion. Will order Ensure now that diet is advanced to promote nutritional adequacy.  Chart review indicates that pt is depressed about pending diagnosis. Work up suggests metastatic colon cancer. Pt is scheduled for sigmoidoscopy 11/24 AM.  Labs reviewed. K: 3.2, CBGS: 81-104.  11/27: -Pt advanced to Regular diet, then modified to Soft diet on 11/26 -Pt reported consuming 50-100% of meals -Pt aware he is on Soft diet, but was not educated on why diet modification had been made. Pt confirmed episodes of loose stools; which RD educated that complying with a low fiber/residue diet can assist alleviate/improve these symptoms  -Has been drinking 2-3 Ensure Complete daily, and enjoys tastes. Provided pt with nutrition supplement coupons, and reviewed supplement options and alternatives for d/c needs -Pt started on Chemo 11/27. No current nutrition related side effects except diarrhea -Dronabinol started on 11/27. Pt to receive TID; continue with appetite stimulant regimen. -Weight trending down, - 3 lb in past 5 days  12/03: -Pt reporting consuming Ensure Complete TID. Enjoys taste and has been requesting them frequently per discussion with NT -Appetite has been fair, RN documentation indicates pt consuming 50% of  meals, and was completing 100% of lunch during time of RD follow up -Has been experiencing loose stools. Lomotil ordered on scheduled basis. Noted to pt that nutritional supplements can contribute to loose stools; however pt still wanting to consume three daily    Height: Ht Readings from Last 1 Encounters:  01/02/14 5\' 10"  (1.778 m)    Weight Status:   Wt Readings from Last 1 Encounters:  01/02/14 155 lb 6.8 oz (70.5 kg)  12/28/13 158 lb  Re-estimated needs:  Kcal: 1800-2000 Protein: 95-110 g Fluid: 1.8-2.0 L/day  Skin: Intact  Diet Order: Diet regular   Intake/Output Summary (Last 24 hours) at 01/10/14 1351 Last data filed at 01/10/14 0859  Gross per 24 hour  Intake   2170 ml  Output   1225 ml  Net    945 ml    Last BM: 12/03   Labs:   Recent Labs Lab 01/08/14 0500 01/09/14 0530 01/10/14 0545  NA 132* 135* 138  K 4.1 3.9 4.0  CL 93* 94* 95*  CO2 27 27 28   BUN 26* 28* 29*  CREATININE 1.02 1.13 1.18  CALCIUM 9.1 8.7 8.8  GLUCOSE 100* 108* 97    CBG (last 3)   Recent Labs  01/07/14 2124 01/08/14 0734 01/08/14 1235  GLUCAP 111* 103* 101*    Scheduled Meds: . aspirin EC  325 mg Oral Daily  . citalopram  10 mg Oral Daily  . diphenoxylate-atropine  2 tablet Oral QID  . dronabinol  5 mg Oral TID AC  . feeding supplement (ENSURE COMPLETE)  237 mL Oral TID BM  . isosorbide-hydrALAZINE  1 tablet  Oral TID  . metoprolol succinate  50 mg Oral Daily  . nicotine  7 mg Transdermal Daily  . pantoprazole  40 mg Oral Daily  . pravastatin  20 mg Oral q1800    Continuous Infusions:   Atlee Abide MS RD LDN Clinical Dietitian [phone]

## 2014-01-10 NOTE — Progress Notes (Signed)
Progress Note   Livingston Denner ACZ:[id number] DOB: Jul 24, 1955 DOA: 12/27/2013 PCP: No primary care provider on file.   Brief Narrative:   [name] is an 58 y.o. male with a PMH of hypertension, dyslipidemia, history of CVA, aortic stenosis and repair of aortic aneurysm at [facility] who was admitted 12/27/13 with a chief complaint of dizziness, lightheadedness, unintentional weight loss of more than 30 pounds, abdominal discomfort. A CT abdomen/pelvis was done and demonstrated a sigmoid mass  suspicious for colon cancer with liver metastasis GI was consulted and a colonoscopy was obtained for tissue sample and revealed almost complete obstruction of colon. Surgery has placed stent for colonic mass and has placed port-a-cath.  Chemotherapy was initiated 01/04/2014. Additionally, patient was found to have new acute and subacute cortical and subcortical CVA. Barrier to discharge is ongoing diarrhea.  Assessment/Plan:    Principal Problem: Acute and subacute punctate cortical and subcortical stroke with dizziness and lightheadedness/cerebral embolism with cerebral infarction  Diagnosed 12/28/13 on MRI. Pt with risk factors including hypertension, dyslipidemia and evidence of prior strokes on MRI brain.  Seen by neurologist 12/28/13 with further recommendations for echocardiogram/carotid Dopplers.  Continue aspirin daily for secondary stroke prevention.  No focal neurologic deficitsappreciated.  Unfortunately, the patient is uninsured and will not be able to get home PT. We'll set up home health RN for safety evaluation.   Active Problems: MRSA carrier  Decontamination therapy ordered. On contact isolation.  Acute kidney injury  Creatinine 1.56 on admission, normalized with IV fluids.  Metastatic colon cancer / sigmoid colon mass and liver metastasis / abnormal liver function enzymes / hepatomegaly / intestinal obstruction  Status post colonoscopy on 12/30/2013 and pathology results  showed high-grade dysplasia but no frank malignancy.  CT scan of abdomen/pelvis done on admission demonstrating a sigmoid mass suspicious for colon cancer with liver metastasis.  Elevated LFTs felt to be secondary to hepatic metastasis.  Patient is status post sigmoidoscopy with metal stenting to help with narrowing give risk for surgery and severe hepatomegaly--he isn't a surgical candidate.  Chemotherapy with FOLFOX started 01/04/2014 per Dr. [name]. Port-A-Cath was placed 01/02/2014.  Diarrhea  Patient developed diarrhea after chemotherapy. He had some blood with diarrhea. No further reports of bleeding but patient continues to have diarrhea. Abdominal x-ray 01/08/14 showed stent placed in the distal sigmoid colon with no evidence of bowel obstruction.  Change lomotil to scheduled dosing.  C. difficile negative.   Leukocytosis  Likely stress demargination, no evidence of acute infection  Blood cultures so far show no growth.  Anemia of chronic disease  Secondary to malignancy and sequela of chemotherapy.  S/P 2 units of PRBCs 01/09/14.  Hypokalemia  Likely from GI losses. Continue to monitor and supplement.  Hyponatremia  Mild. Continue gentle IV fluids.  Essential hypertension   Continue bidil, metoprolol. Blood pressure spiked up this a.m.  May need to adjust BP meds if trend continues.  Dyslipidemia  Continue Pravachol 20 mg daily.   Depression and insomnia  Continue celexa and trazodone   Severe protein calorie malnutrition/loss of weight  Continue feeding supplementation, Marinol TID.  Dietitian consulted and recommendations noted.  Lactic acidosis   Elevated serum lactate noted on admission, resolved.  DVT Prophylaxis   Continue SCDs.    Code Status: Full. Family Communication: No family at bedside. Disposition Plan: Home when stable.   IV Access:    Port-A-Cath   Procedures and diagnostic studies:   Ct Chest W Contrast  12/31/2013 1. Nonspecific  small pulmonary nodules. These are likely new since the remote exam of 07/20/2010. Early pulmonary metastasis cannot be excluded. 2. Status post right sided aortic stent graft repair, without acute complication. 3. Cardiomegaly and left ventricular hypertrophy. 4. Multiple liver masses with suggestion of cirrhosis. This could represent metastatic disease and/or hepatocellular carcinoma/carcinomas. 5. Upper abdominal ascites. 6. Gynecomastia.   Dg Chest Port 1 View 01/02/2014 No pneumothorax status post left internal jugular Port-A-Cath placement. Distal tip is seen overlying expected position of cavoatrial junction.   Dg Abd 2 Views 01/02/2014 1. There is no bowel obstruction demonstrated. A partially collapsed sigmoid colonic stent graft is present within the true bony pelvis. 2. Gallstones    Dg Abd 2 Views 01/01/2014 Stent placement for treatment of colonic mass.   Dg C-arm 61-120 Min 01/01/2014 Stent placement for treatment of colonic mass.  Dg Fluoro Guide Cv Line Left 01/04/2014 Intraoperative images from Port-A-Cath placement as described.   Dg Chest Port 1 View 01/06/2014 No evidence of acute abnormality. Cardiomegaly with right-sided aortic arch stent graft. Mild right basilar atelectasis/scarring again noted.   Dg Abd 2 Views 01/08/2014 1. Stent placed in the distal sigmoid colon, as above. No evidence of bowel obstruction. 2. No pneumoperitoneum. 3. Cholelithiasis. 4. Hepatomegaly. 5. Additional incidental findings, as above.   Medical Consultants:    Dr. Burney Gauze, Oncology  Dr. Delfin Edis, GI.  Dr. Jim Like, Neurology  Dr. Greer Pickerel, Surgery  Anti-Infectives:    None.  Subjective:   [name] feels about the same, ongoing diarrhea, feels a bit weak.  Appetite fair but drinking Ensure well.  Denies N/V.  Objective:    Filed Vitals:   01/09/14 2220 01/09/14 2255 01/10/14 0047 01/10/14 0612  BP: 152/97  156/94 146/96 135/95  Pulse: 79 85 79 84  Temp: 98.4 F (36.9 C) 99 F (37.2 C) 98.2 F (36.8 C) 98.3 F (36.8 C)  TempSrc: Oral Oral Oral Oral  Resp: 20 20 20 20   Height:      Weight:      SpO2: 99% 99% 98% 100%    Intake/Output Summary (Last 24 hours) at 01/10/14 1046 Last data filed at 01/10/14 0859  Gross per 24 hour  Intake   2410 ml  Output   1225 ml  Net   1185 ml    Exam: Gen:  NAD Cardiovascular:  RRR, III/VI murmur Respiratory:  Lungs CTAB Gastrointestinal:  Abdomen soft, slightly distended, + BS Extremities:  No C/E/C   Data Reviewed:    Labs: Basic Metabolic Panel:  Recent Labs Lab 01/06/14 0518 01/07/14 0500 01/08/14 0500 01/09/14 0530 01/10/14 0545  NA 134* 136* 132* 135* 138  K 4.5 4.4 4.1 3.9 4.0  CL 93* 96 93* 94* 95*  CO2 27 29 27 27 28   GLUCOSE 108* 100* 100* 108* 97  BUN 24* 27* 26* 28* 29*  CREATININE 1.20 1.13 1.02 1.13 1.18  CALCIUM 9.0 8.8 9.1 8.7 8.8   GFR Estimated Creatinine Clearance: 68 mL/min (by C-G formula based on Cr of 1.18). Liver Function Tests:  Recent Labs Lab 01/05/14 0450 01/06/14 0518 01/07/14 0500 01/08/14 0500 01/09/14 0530  AST 88* 150* 113* 97* 86*  ALT 18 36 31 28 23   ALKPHOS 290* 357* 287* 307* 289*  BILITOT 0.3 0.5 0.5 0.6 0.5  PROT 7.9 7.3 6.7 7.2 6.9  ALBUMIN 2.5* 2.4* 2.2* 2.3* 2.2*    CBC:  Recent Labs Lab 01/06/14 0518 01/07/14 0500 01/08/14 0500 01/09/14 0530 01/10/14 0545  WBC 15.3* 12.2* 14.0* 10.8* 13.2*  HGB 9.7* 9.2* 9.4* 8.6* 10.5*  HCT 29.4* 28.4* 28.0* 26.6* 31.1*  MCV 81.0 82.8 80.7 82.9 80.8  PLT 330 260 288 281 297   CBG:  Recent Labs Lab 01/07/14 0728 01/07/14 1240 01/07/14 2124 01/08/14 0734 01/08/14 1235  GLUCAP 115* 97 111* 103* 101*    Microbiology Recent Results (from the past 240 hour(s))  Surgical pcr screen     Status: Abnormal   Collection Time: 01/02/14 10:12 AM  Result Value Ref Range Status   MRSA, PCR NEGATIVE NEGATIVE Final    Staphylococcus aureus POSITIVE (A) NEGATIVE Final    Comment:        The Xpert SA Assay (FDA approved for NASAL specimens in patients over 69 years of age), is one component of a comprehensive surveillance program.  Test performance has been validated by [employer] for patients greater than or equal to 41 year old. It is not intended to diagnose infection nor to guide or monitor treatment.   Culture, blood (routine x 2)     Status: None (Preliminary result)   Collection Time: 01/06/14  9:45 PM  Result Value Ref Range Status   Specimen Description BLOOD R ARM  Final   Special Requests BOTTLES DRAWN AEROBIC AND ANAEROBIC 10 CC EACH  Final   Culture  Setup Time   Final    01/07/2014 01:17 Performed at [facility]    Culture   Final           BLOOD CULTURE RECEIVED NO GROWTH TO DATE CULTURE WILL BE HELD FOR 5 DAYS BEFORE ISSUING A FINAL NEGATIVE REPORT Performed at [facility]    Report Status PENDING  Incomplete  Culture, blood (routine x 2)     Status: None (Preliminary result)   Collection Time: 01/06/14  9:50 PM  Result Value Ref Range Status   Specimen Description BLOOD L ARM  Final   Special Requests BOTTLES DRAWN AEROBIC AND ANAEROBIC 10CC EACH  Final   Culture  Setup Time   Final    01/07/2014 01:17 Performed at [facility]    Culture   Final           BLOOD CULTURE RECEIVED NO GROWTH TO DATE CULTURE WILL BE HELD FOR 5 DAYS BEFORE ISSUING A FINAL NEGATIVE REPORT Performed at [facility]    Report Status PENDING  Incomplete  Culture, Urine     Status: None   Collection Time: 01/06/14 10:50 PM  Result Value Ref Range Status   Specimen Description URINE, CLEAN CATCH  Final   Special Requests NONE  Final   Culture  Setup Time   Final    01/07/2014 08:59 Performed at [place] Performed at [facility]   Final   Culture NO GROWTH Performed at [facility]   Final   Report  Status 01/08/2014 FINAL  Final  Clostridium Difficile by PCR     Status: None   Collection Time: 01/09/14  8:53 AM  Result Value Ref Range Status   C difficile by pcr NEGATIVE NEGATIVE Final    Comment: Performed at [facility]     Medications:   . aspirin EC  325 mg Oral Daily  . citalopram  10 mg Oral Daily  . dronabinol  5 mg Oral TID AC  . feeding supplement (ENSURE COMPLETE)  237 mL Oral TID BM  . ferumoxytol  1,020 mg Intravenous Once  . isosorbide-hydrALAZINE  1 tablet Oral TID  . metoprolol succinate  50 mg Oral Daily  . nicotine  7 mg Transdermal Daily  . pantoprazole  40 mg Oral Daily  . pravastatin  20 mg Oral q1800   Continuous Infusions:   Time spent: 25 minutes.   LOS: 14 days   [facility] Pager [phone]. If unable to reach me by pager, please call my cell phone at [phone].  *Please refer to [email or web address], password [email or web address] to get updated schedule on who will round on this patient, as hospitalists switch teams weekly. If 7PM-7AM, please contact night-coverage at [email or web address], password [email or web address] for any overnight needs.  01/10/2014, 10:46 AM

## 2014-01-10 NOTE — Care Management Note (Signed)
CARE MANAGEMENT NOTE 01/10/2014  Patient:  [name], [name]   Account Number:  [id number]  Date Initiated:  12/28/2013  Documentation initiated by:  Tomi Bamberger  Subjective/Objective Assessment:   dx lactic acidosis, tia  admit as observation- from home     Action/Plan:   11/20 on clears  pt eval- rec hhpt- pt has no insurance.   Anticipated DC Date:  01/11/2014   Anticipated DC Plan:  [place]  CM consult  Follow-up appt scheduled      [facility] Choice  HOME HEALTH   Choice offered to / List presented to:  C-1 Patient        [place] arranged  HH-1 RN      [place].   Status of service:  In process, will continue to follow Medicare Important Message given?  NO (If response is "NO", the following Medicare IM given date fields will be blank) Date Medicare IM given:   Medicare IM given by:   Date Additional Medicare IM given:   Additional Medicare IM given by:    Discharge Disposition:    Per UR Regulation:  Reviewed for med. necessity/level of care/duration of stay  If discussed at [place] of Stay Meetings, dates discussed:   01/08/2014  01/10/2014    Comments:  01/10/14 Marney Doctor RN,BSN,NCM [phone] Pts wife called this CM on 01/09/14 and states that pt needs housing information due to them being separated and him not being able to stay with her long term at [place].  List of homeless shelters given to pt and informed wife that she would need to follow up with DSS about other housing options if he has no where else to go long term. HH orders written.  [facility] rep following.  CM will continue to follow for DC needs.  01/08/14 Marney Doctor RN,BSN,NCM [phone] PT is recommending HHPT.  Will need MD orders for [facility] for safety eval due to pt being self pay.  MD informed.  CM will continue to follow.  12/31/13 [place] BSN [phone] patient has apt scheduled on 12/4 at 1:30 at [facility] on [address].   He has a orange card for meds as well.  12/28/13 [place], BSN [phone] NCM spoke with patient he has an orange card  for his med, he goes to [facility] on [address].

## 2014-01-11 DIAGNOSIS — R531 Weakness: Secondary | ICD-10-CM | POA: Insufficient documentation

## 2014-01-11 LAB — CBC
HEMATOCRIT: 31.1 % — AB (ref 39.0–52.0)
HEMOGLOBIN: 10.4 g/dL — AB (ref 13.0–17.0)
MCH: 27.3 pg (ref 26.0–34.0)
MCHC: 33.4 g/dL (ref 30.0–36.0)
MCV: 81.6 fL (ref 78.0–100.0)
Platelets: 300 10*3/uL (ref 150–400)
RBC: 3.81 MIL/uL — AB (ref 4.22–5.81)
RDW: 19.4 % — ABNORMAL HIGH (ref 11.5–15.5)
WBC: 16 10*3/uL — ABNORMAL HIGH (ref 4.0–10.5)

## 2014-01-11 LAB — COMPREHENSIVE METABOLIC PANEL
ALBUMIN: 2.2 g/dL — AB (ref 3.5–5.2)
ALK PHOS: 306 U/L — AB (ref 39–117)
ALT: 19 U/L (ref 0–53)
ANION GAP: 12 (ref 5–15)
AST: 79 U/L — ABNORMAL HIGH (ref 0–37)
BUN: 27 mg/dL — AB (ref 6–23)
CALCIUM: 9.1 mg/dL (ref 8.4–10.5)
CO2: 29 mEq/L (ref 19–32)
Chloride: 93 mEq/L — ABNORMAL LOW (ref 96–112)
Creatinine, Ser: 1.11 mg/dL (ref 0.50–1.35)
GFR calc Af Amer: 83 mL/min — ABNORMAL LOW (ref >90)
GFR calc non Af Amer: 71 mL/min — ABNORMAL LOW (ref >90)
Glucose, Bld: 98 mg/dL (ref 70–99)
POTASSIUM: 4 meq/L (ref 3.7–5.3)
Sodium: 134 mEq/L — ABNORMAL LOW (ref 137–147)
TOTAL PROTEIN: 7 g/dL (ref 6.0–8.3)
Total Bilirubin: 0.4 mg/dL (ref 0.3–1.2)

## 2014-01-11 NOTE — Plan of Care (Signed)
Problem: Phase I Progression Outcomes Goal: OOB as tolerated unless otherwise ordered Outcome: Progressing     

## 2014-01-11 NOTE — Progress Notes (Signed)
Progress Note   [name] YSA:[id number] DOB: 11-Apr-1955 DOA: 12/27/2013 PCP: No primary care provider on file.   Brief Narrative:   [name] is an 58 y.o. male with a PMH of hypertension, dyslipidemia, history of CVA, aortic stenosis and repair of aortic aneurysm at [facility] who was admitted 12/27/13 with a chief complaint of dizziness, lightheadedness, unintentional weight loss of more than 30 pounds, abdominal discomfort. A CT abdomen/pelvis was done and demonstrated a sigmoid mass  suspicious for colon cancer with liver metastasis GI was consulted and a colonoscopy was obtained for tissue sample and revealed almost complete obstruction of colon. Surgery has placed stent for colonic mass and has placed port-a-cath.  Chemotherapy was initiated 01/04/2014. Additionally, patient was found to have new acute and subacute cortical and subcortical CVA. Barrier to discharge is ongoing diarrhea.  Assessment/Plan:    Principal Problem: Acute and subacute punctate cortical and subcortical stroke with dizziness and lightheadedness/cerebral embolism with cerebral infarction  Diagnosed 12/28/13 on MRI. Pt with risk factors including hypertension, dyslipidemia and evidence of prior strokes on MRI brain.  Seen by neurologist 12/28/13 with further recommendations for echocardiogram/carotid Dopplers.  Continue aspirin daily for secondary stroke prevention.  No focal neurologic deficitsappreciated, but patient continues to feel weak.  Unfortunately, the patient is uninsured and will not be able to get home PT. We'll set up home health RN for safety evaluation.   Active Problems: MRSA carrier  Decontamination therapy ordered. On contact isolation.  Acute kidney injury  Creatinine 1.56 on admission, normalized with IV fluids.  Metastatic colon cancer / sigmoid colon mass and liver metastasis / abnormal liver function enzymes / hepatomegaly / intestinal obstruction  Status post colonoscopy  on 12/30/2013 and pathology results showed high-grade dysplasia but no frank malignancy.  CT scan of abdomen/pelvis done on admission demonstrating a sigmoid mass suspicious for colon cancer with liver metastasis.  Elevated LFTs felt to be secondary to hepatic metastasis.  Patient is status post sigmoidoscopy with metal stenting to help with narrowing give risk for surgery and severe hepatomegaly--he isn't a surgical candidate.  Chemotherapy with FOLFOX started 01/04/2014 per Dr. [name]. Port-A-Cath was placed 01/02/2014.  Diarrhea  Patient developed diarrhea after chemotherapy. He had some blood with diarrhea. No further reports of bleeding but patient continues to have diarrhea. Abdominal x-ray 01/08/14 showed stent placed in the distal sigmoid colon with no evidence of bowel obstruction.  Diarrhe improved with changing lomotil to scheduled dosing.  C. difficile negative.   Leukocytosis  Likely stress demargination, no evidence of acute infection  Blood cultures so far show no growth.  Anemia of chronic disease  Secondary to malignancy and sequela of chemotherapy.  S/P 2 units of PRBCs 01/09/14.  Hypokalemia  Likely from GI losses. Continue to monitor and supplement.  Hyponatremia  Mild. Continue gentle IV fluids.  Essential hypertension   Continue bidil, metoprolol.   Dyslipidemia  Continue Pravachol 20 mg daily.   Depression and insomnia  Continue celexa and trazodone   Severe protein calorie malnutrition/loss of weight  Continue feeding supplementation, Marinol TID.  Dietitian consulted and recommendations noted.  Lactic acidosis   Elevated serum lactate noted on admission, resolved.  DVT Prophylaxis   Continue SCDs.    Code Status: Full. Family Communication: No family at bedside. Disposition Plan: Home when stable.   IV Access:    Port-A-Cath   Procedures and diagnostic studies:   Ct Chest W Contrast 12/31/2013 1. Nonspecific small  pulmonary nodules. These are likely  new since the remote exam of 07/20/2010. Early pulmonary metastasis cannot be excluded. 2. Status post right sided aortic stent graft repair, without acute complication. 3. Cardiomegaly and left ventricular hypertrophy. 4. Multiple liver masses with suggestion of cirrhosis. This could represent metastatic disease and/or hepatocellular carcinoma/carcinomas. 5. Upper abdominal ascites. 6. Gynecomastia.   Dg Chest Port 1 View 01/02/2014 No pneumothorax status post left internal jugular Port-A-Cath placement. Distal tip is seen overlying expected position of cavoatrial junction.   Dg Abd 2 Views 01/02/2014 1. There is no bowel obstruction demonstrated. A partially collapsed sigmoid colonic stent graft is present within the true bony pelvis. 2. Gallstones    Dg Abd 2 Views 01/01/2014 Stent placement for treatment of colonic mass.   Dg C-arm 61-120 Min 01/01/2014 Stent placement for treatment of colonic mass.  Dg Fluoro Guide Cv Line Left 01/04/2014 Intraoperative images from Port-A-Cath placement as described.   Dg Chest Port 1 View 01/06/2014 No evidence of acute abnormality. Cardiomegaly with right-sided aortic arch stent graft. Mild right basilar atelectasis/scarring again noted.   Dg Abd 2 Views 01/08/2014 1. Stent placed in the distal sigmoid colon, as above. No evidence of bowel obstruction. 2. No pneumoperitoneum. 3. Cholelithiasis. 4. Hepatomegaly. 5. Additional incidental findings, as above.   Medical Consultants:    Dr. Burney Gauze, Oncology  Dr. Delfin Edis, GI.  Dr. Jim Like, Neurology  Dr. Greer Pickerel, Surgery  Anti-Infectives:    None.  Subjective:   [name] feels weak and doesn't feel quite ready to go home.  He lives alone, and is uninsured so will not be able to get home health PT or consistent help.  Appetite fair but drinking Ensure well.  Denies N/V.  Objective:    Filed Vitals:   01/10/14 1412  01/10/14 2125 01/11/14 0610 01/11/14 1000  BP: 136/96 127/100 138/90 142/91  Pulse: 80 81 77 78  Temp: 97.4 F (36.3 C) 97.9 F (36.6 C) 98.2 F (36.8 C)   TempSrc: Oral Oral Oral   Resp: 18 20 16    Height:      Weight:      SpO2: 99% 99% 97%     Intake/Output Summary (Last 24 hours) at 01/11/14 1009 Last data filed at 01/11/14 2725  Gross per 24 hour  Intake    950 ml  Output    100 ml  Net    850 ml    Exam: Gen:  NAD Cardiovascular:  RRR, III/VI murmur Respiratory:  Lungs CTAB Gastrointestinal:  Abdomen soft, slightly distended, + BS Extremities:  No C/E/C   Data Reviewed:    Labs: Basic Metabolic Panel:  Recent Labs Lab 01/07/14 0500 01/08/14 0500 01/09/14 0530 01/10/14 0545 01/11/14 0550  NA 136* 132* 135* 138 134*  K 4.4 4.1 3.9 4.0 4.0  CL 96 93* 94* 95* 93*  CO2 29 27 27 28 29   GLUCOSE 100* 100* 108* 97 98  BUN 27* 26* 28* 29* 27*  CREATININE 1.13 1.02 1.13 1.18 1.11  CALCIUM 8.8 9.1 8.7 8.8 9.1   GFR Estimated Creatinine Clearance: 72.3 mL/min (by C-G formula based on Cr of 1.11). Liver Function Tests:  Recent Labs Lab 01/06/14 0518 01/07/14 0500 01/08/14 0500 01/09/14 0530 01/11/14 0550  AST 150* 113* 97* 86* 79*  ALT 36 31 28 23 19   ALKPHOS 357* 287* 307* 289* 306*  BILITOT 0.5 0.5 0.6 0.5 0.4  PROT 7.3 6.7 7.2 6.9 7.0  ALBUMIN 2.4* 2.2* 2.3* 2.2* 2.2*    CBC:  Recent Labs Lab 01/07/14 0500 01/08/14 0500 01/09/14 0530 01/10/14 0545 01/11/14 0550  WBC 12.2* 14.0* 10.8* 13.2* 16.0*  HGB 9.2* 9.4* 8.6* 10.5* 10.4*  HCT 28.4* 28.0* 26.6* 31.1* 31.1*  MCV 82.8 80.7 82.9 80.8 81.6  PLT 260 288 281 297 300   CBG:  Recent Labs Lab 01/07/14 0728 01/07/14 1240 01/07/14 2124 01/08/14 0734 01/08/14 1235  GLUCAP 115* 97 111* 103* 101*    Microbiology Recent Results (from the past 240 hour(s))  Surgical pcr screen     Status: Abnormal   Collection Time: 01/02/14 10:12 AM  Result Value Ref Range Status   MRSA, PCR  NEGATIVE NEGATIVE Final   Staphylococcus aureus POSITIVE (A) NEGATIVE Final    Comment:        The Xpert SA Assay (FDA approved for NASAL specimens in patients over 37 years of age), is one component of a comprehensive surveillance program.  Test performance has been validated by [employer] for patients greater than or equal to 65 year old. It is not intended to diagnose infection nor to guide or monitor treatment.   Culture, blood (routine x 2)     Status: None (Preliminary result)   Collection Time: 01/06/14  9:45 PM  Result Value Ref Range Status   Specimen Description BLOOD R ARM  Final   Special Requests BOTTLES DRAWN AEROBIC AND ANAEROBIC 10 CC EACH  Final   Culture  Setup Time   Final    01/07/2014 01:17 Performed at [facility]    Culture   Final           BLOOD CULTURE RECEIVED NO GROWTH TO DATE CULTURE WILL BE HELD FOR 5 DAYS BEFORE ISSUING A FINAL NEGATIVE REPORT Performed at [facility]    Report Status PENDING  Incomplete  Culture, blood (routine x 2)     Status: None (Preliminary result)   Collection Time: 01/06/14  9:50 PM  Result Value Ref Range Status   Specimen Description BLOOD L ARM  Final   Special Requests BOTTLES DRAWN AEROBIC AND ANAEROBIC 10CC EACH  Final   Culture  Setup Time   Final    01/07/2014 01:17 Performed at [facility]    Culture   Final           BLOOD CULTURE RECEIVED NO GROWTH TO DATE CULTURE WILL BE HELD FOR 5 DAYS BEFORE ISSUING A FINAL NEGATIVE REPORT Performed at [facility]    Report Status PENDING  Incomplete  Culture, Urine     Status: None   Collection Time: 01/06/14 10:50 PM  Result Value Ref Range Status   Specimen Description URINE, CLEAN CATCH  Final   Special Requests NONE  Final   Culture  Setup Time   Final    01/07/2014 08:59 Performed at [place] Performed at [facility]   Final   Culture NO GROWTH Performed at [facility]   Final   Report Status 01/08/2014 FINAL  Final  Clostridium Difficile by PCR     Status: None   Collection Time: 01/09/14  8:53 AM  Result Value Ref Range Status   C difficile by pcr NEGATIVE NEGATIVE Final    Comment: Performed at [facility]     Medications:   . aspirin EC  325 mg Oral Daily  . citalopram  10 mg Oral Daily  . diphenoxylate-atropine  2 tablet Oral QID  . dronabinol  5 mg Oral TID AC  . feeding supplement (ENSURE COMPLETE)  237 mL Oral TID BM  . isosorbide-hydrALAZINE  1 tablet Oral TID  . metoprolol succinate  50 mg Oral Daily  . nicotine  7 mg Transdermal Daily  . pravastatin  20 mg Oral q1800   Continuous Infusions:   Time spent: 25 minutes.   LOS: 15 days   [facility] Pager [phone]. If unable to reach me by pager, please call my cell phone at [phone].  *Please refer to [email or web address], password [email or web address] to get updated schedule on who will round on this patient, as hospitalists switch teams weekly. If 7PM-7AM, please contact night-coverage at [email or web address], password [email or web address] for any overnight needs.  01/11/2014, 10:09 AM

## 2014-01-11 NOTE — Progress Notes (Signed)
[name]   DOB:[birth year]   GE#:[id number]   YYT#:[id number]  No care team member to display  Subjective: Patient seen and examined. He continues to complain about abdominal distention, in the setting of malignancy. He denies any worsening abdominal pain, nausea or vomiting. He denies any diarrhea. His appetite is improving. He denies any lower extremity edema. He denies any shortness of breath or chest pain.He denies any dysuria or hematuria. He denies any dizziness. Bleeding issues reported. He wants to increase ambulation.  Scheduled Meds: . aspirin EC  325 mg Oral Daily  . citalopram  10 mg Oral Daily  . diphenoxylate-atropine  2 tablet Oral QID  . dronabinol  5 mg Oral TID AC  . feeding supplement (ENSURE COMPLETE)  237 mL Oral TID BM  . isosorbide-hydrALAZINE  1 tablet Oral TID  . metoprolol succinate  50 mg Oral Daily  . nicotine  7 mg Transdermal Daily  . pravastatin  20 mg Oral q1800   Continuous Infusions:  PRN Meds:oxyCODONE, sodium chloride, sodium chloride, sodium chloride, traZODone   Objective:  Filed Vitals:   01/11/14 1000  BP: 142/91  Pulse: 78  Temp:   Resp:       Intake/Output Summary (Last 24 hours) at 01/11/14 1006 Last data filed at 01/11/14 2751  Gross per 24 hour  Intake    950 ml  Output    100 ml  Net    850 ml    ECOG PERFORMANCE STATUS: 2  GENERAL:alert, no distress and uncomfortable due to abdominal distention SKIN: skin color, texture, turgor are normal, no rashes or significant lesions EYES: normal, conjunctiva are pink and non-injected, sclera clear OROPHARYNX:no exudate, no erythema and lips, buccal mucosa, and tongue normal  NECK: supple, thyroid normal size, non-tender, without nodularity LYMPH:  no palpable lymphadenopathy in the cervical, axillary or inguinal LUNGS: clear to auscultation and percussion with normal breathing effort HEART: regular rate & rhythm and 3/6 systolic murmur and no lower extremity edema ABDOMEN:Distended,  nontender to palpation, but the megaly noted decreased bowel sounds. No obvious splenomegaly Musculoskeletal:no cyanosis of digits and no clubbing  PSYCH: alert & oriented x 3 with fluent speech NEURO: no focal motor/sensory deficits    CBG (last 3)   Recent Labs  01/08/14 1235  GLUCAP 101*     Labs:   Recent Labs Lab 01/07/14 0500 01/08/14 0500 01/09/14 0530 01/10/14 0545 01/11/14 0550  WBC 12.2* 14.0* 10.8* 13.2* 16.0*  HGB 9.2* 9.4* 8.6* 10.5* 10.4*  HCT 28.4* 28.0* 26.6* 31.1* 31.1*  PLT 260 288 281 297 300  MCV 82.8 80.7 82.9 80.8 81.6  MCH 26.8 27.1 26.8 27.3 27.3  MCHC 32.4 33.6 32.3 33.8 33.4  RDW 20.3* 20.3* 20.0* 19.0* 19.4*     Chemistries:    Recent Labs Lab 01/06/14 0518 01/07/14 0500 01/08/14 0500 01/09/14 0530 01/10/14 0545 01/11/14 0550  NA 134* 136* 132* 135* 138 134*  K 4.5 4.4 4.1 3.9 4.0 4.0  CL 93* 96 93* 94* 95* 93*  CO2 27 29 27 27 28 29   GLUCOSE 108* 100* 100* 108* 97 98  BUN 24* 27* 26* 28* 29* 27*  CREATININE 1.20 1.13 1.02 1.13 1.18 1.11  CALCIUM 9.0 8.8 9.1 8.7 8.8 9.1  AST 150* 113* 97* 86*  --  79*  ALT 36 31 28 23   --  19  ALKPHOS 357* 287* 307* 289*  --  306*  BILITOT 0.5 0.5 0.6 0.5  --  0.4    GFR  Estimated Creatinine Clearance: 72.3 mL/min (by C-G formula based on Cr of 1.11).  Liver Function Tests:  Recent Labs Lab 01/06/14 0518 01/07/14 0500 01/08/14 0500 01/09/14 0530 01/11/14 0550  AST 150* 113* 97* 86* 79*  ALT 36 31 28 23 19   ALKPHOS 357* 287* 307* 289* 306*  BILITOT 0.5 0.5 0.6 0.5 0.4  PROT 7.3 6.7 7.2 6.9 7.0  ALBUMIN 2.4* 2.2* 2.3* 2.2* 2.2*    Urine Studies     Component Value Date/Time   COLORURINE YELLOW 01/06/2014 2250   APPEARANCEUR CLEAR 01/06/2014 2250   LABSPEC 1.016 01/06/2014 2250   PHURINE 6.5 01/06/2014 2250   GLUCOSEU NEGATIVE 01/06/2014 2250   HGBUR NEGATIVE 01/06/2014 2250   BILIRUBINUR NEGATIVE 01/06/2014 2250   KETONESUR NEGATIVE 01/06/2014 2250   PROTEINUR 30*  01/06/2014 2250   UROBILINOGEN 1.0 01/06/2014 2250   NITRITE NEGATIVE 01/06/2014 2250   LEUKOCYTESUR NEGATIVE 01/06/2014 2250    CBG:  Recent Labs Lab 01/07/14 0728 01/07/14 1240 01/07/14 2124 01/08/14 0734 01/08/14 1235  GLUCAP 115* 97 111* 103* 101*   Microbiology Cultures pending    Imaging Studies:  No results found.  Assessment/Plan: 58 y.o.   Metastatic colon cancer, with a sigmoid colon mass and liver metastases Abnormal liver function enzymes Hepatomegaly History of intestinal obstruction  Status post colonoscopy on 12/30/2013 and pathology results showed high-grade dysplasia but no frank malignancy.  CT scan of abdomen/pelvis done on admission demonstrating a sigmoid mass suspicious for colon cancer with liver metastasis.Elevated LFTs felt to be secondary to hepatic metastasis.  Patient is status post sigmoidoscopy with metal stenting to help with narrowing give risk for surgery and severe hepatomegaly. Not a surgical candidate.  Port-A-Cath was placed on 01/02/2014  Chemotherapy with FOLFOX started 01/04/2014, Cycle 1 per Dr. [name]. Port-A-Cath was placed 01/02/2014.  Diarrhea Likely secondary to #1 C. Difficile and negative. Cultures are negative Dominant x-ray is negative for obstruction or ileus This diarrhea is now resolved  Abdominal discomfort This is secondary to metastatic disease Control with current Pain management  Leukocytosis Likely reactive,In the setting of inflammation, Recent steroids on 01/04/2014 Cultures are pending, Negative to date  Anemia Secondary to malignancy, recent blood in the stools, malnutrition Iron deficiency and dilution No further bleeding issues were noted As the patient was symptomatic, 2 units of blood were given on 01/09/14, for hemoglobin of 8.6, today at 10.4 He  also received Feraheme on 01/10/2014  Hypokalemia Secondary to GI losses, continue IV fluids and potassium supplement as per primary  team  Malnutrition, With severe weight loss Appreciate dietitian involvement Continue feeding supplementation, as well as Marinol 3 times a day  DVT prophylaxis On mechanical devices and aspirin daily  Acute and subacute punctate cortical and subcortical stroke with dizziness and lightheadedness/ Cerebral embolism with cerebral infarction  Diagnosed 12/28/13 on MRI. Risk factors include hypertension, dyslipidemia and evidence of prior strokes on MRI brain.  Seen by neurologist 12/28/13 with further recommendations for echocardiogram/carotid Dopplers.  Continue aspirin daily for secondary stroke prevention.  No focal neurologic deficitsappreciated.  Unfortunately, the patient is uninsured and will not be able to get home PT. Home health nursing being arranged for safety evaluation.  Full code  Disposition the patient is likely to be discharged within  the next 24 hours if clinically  stable Other medical issues as per admitting team     **Disclaimer: This note was dictated with voice recognition software. Similar sounding words can inadvertently be transcribed and this note may contain  transcription errors which may not have been corrected upon publication of note.** WERTMAN,SARA E, PA-C 01/11/2014  10:06 AM   ADDENDUM:  I saw and examined the patient this morning. I think that the diarrhea is improving. He is C. difficile negative.  He is on Lomotil on a schedule.  His appetite is improving. Hopefully this is from the Marinol that he is on.  He's not had any fever. There's been no bleeding. He did get iron yesterday.  On his exam, his abdomen does not feel as distended. He still has hepatomegaly. Cardiac exam still shows the 3/6 systolic murmur. His lungs are clear. Extremities shows no edema.  Hopefully, he will be able to go home tomorrow. I will like to think that he should be able to go home.  Laurey Arrow

## 2014-01-11 NOTE — Plan of Care (Signed)
Problem: Phase II Progression Outcomes Goal: Progress activity as tolerated unless otherwise ordered Outcome: Progressing Goal: Discharge plan established Outcome: Completed/Met Date Met:  01/11/14 Goal: IV changed to normal saline lock Outcome: Completed/Met Date Met:  01/11/14

## 2014-01-12 LAB — CBC
HEMATOCRIT: 30.4 % — AB (ref 39.0–52.0)
Hemoglobin: 9.8 g/dL — ABNORMAL LOW (ref 13.0–17.0)
MCH: 26.9 pg (ref 26.0–34.0)
MCHC: 32.2 g/dL (ref 30.0–36.0)
MCV: 83.5 fL (ref 78.0–100.0)
Platelets: 294 10*3/uL (ref 150–400)
RBC: 3.64 MIL/uL — ABNORMAL LOW (ref 4.22–5.81)
RDW: 20 % — AB (ref 11.5–15.5)
WBC: 13.6 10*3/uL — ABNORMAL HIGH (ref 4.0–10.5)

## 2014-01-12 MED ORDER — SACCHAROMYCES BOULARDII 250 MG PO CAPS
250.0000 mg | ORAL_CAPSULE | Freq: Two times a day (BID) | ORAL | Status: DC
  Administered 2014-01-12 – 2014-01-14 (×5): 250 mg via ORAL
  Filled 2014-01-12 (×6): qty 1

## 2014-01-12 MED ORDER — BOOST / RESOURCE BREEZE PO LIQD
1.0000 | Freq: Three times a day (TID) | ORAL | Status: DC
  Administered 2014-01-12 – 2014-01-14 (×7): 1 via ORAL

## 2014-01-12 MED ORDER — BISMUTH SUBSALICYLATE 262 MG/15ML PO SUSP
30.0000 mL | Freq: Three times a day (TID) | ORAL | Status: DC
  Administered 2014-01-12 (×4): 30 mL via ORAL
  Filled 2014-01-12: qty 236

## 2014-01-12 NOTE — Progress Notes (Signed)
[name] is still having diarrhea. He reports 4 or 5 episodes just today. He is negative for C. difficile.  I wonder if the Ensure that he is drinking is causing this. I will stop this.  I would love to try to get him home but I just am afraid of him getting dehydrated very quickly.  He is not complaining of any pain. He's complaining of low bit of abdominal bloating. His bowel sounds are decreased. I will have to check another abdominal film on him to make sure that there is no ileus going on.  He has not noted any obvious bleeding. He's had no cough. He is out of bed.  His labs today show his hemoglobin to be 9.8. He did get a dose of IV iron a couple days ago.  He's been afebrile.  I also wonder if one of his medications might be causing the diarrhea. He is on the Isordil/hydralazine combination. This might be also fact with diarrhea.  I will give him some Pepto-Bismol to try to help.  On his exam, he is afebrile. His blood pressure is 145/85. Temperature 98.3. Pulse is 73. Lungs are clear. Cardiac exam is regular rate and rhythm. He has a 3/6 systolic ejection murmur. Abdomen is soft. He is slightly distended. Bowel sounds are decreased. He has hepatomegaly. Extremities shows no clubbing, cyanosis or edema.  Again, if it was not for the diarrhea, he would be old to go home. Again I just worry about him getting dehydrated.  I do appreciate all the gray care that he is getting.  Pete E.  1 Cor 13:13

## 2014-01-12 NOTE — Progress Notes (Signed)
Progress Note   [name] KGM:[id number] DOB: 09-01-1955 DOA: 12/27/2013 PCP: No primary care provider on file.   Brief Narrative:   [name] is an 58 y.o. male with a PMH of hypertension, dyslipidemia, history of CVA, aortic stenosis and repair of aortic aneurysm at [facility] who was admitted 12/27/13 with a chief complaint of dizziness, lightheadedness, unintentional weight loss of more than 30 pounds, abdominal discomfort. A CT abdomen/pelvis was done and demonstrated a sigmoid mass  suspicious for colon cancer with liver metastasis GI was consulted and a colonoscopy was obtained for tissue sample and revealed almost complete obstruction of colon. Surgery has placed stent for colonic mass and has placed port-a-cath.  Chemotherapy was initiated 01/04/2014. Additionally, patient was found to have new acute and subacute cortical and subcortical CVA. Barrier to discharge is ongoing diarrhea and generalized weakness with poor social circumstances/no significant help or insurance to pay for home health care.  Assessment/Plan:    Principal Problem: Acute and subacute punctate cortical and subcortical stroke with dizziness and lightheadedness/cerebral embolism with cerebral infarction  Diagnosed 12/28/13 on MRI. Pt with risk factors including hypertension, dyslipidemia and evidence of prior strokes on MRI brain.  Seen by neurologist 12/28/13 with further recommendations for echocardiogram/carotid Dopplers.  Continue aspirin daily for secondary stroke prevention.  No focal neurologic deficitsappreciated, but patient continues to feel weak.  Unfortunately, the patient is uninsured and will not be able to get home PT. We'll set up home health RN for safety evaluation.   Active Problems: MRSA carrier  Decontamination therapy ordered. On contact isolation.  Acute kidney injury  Creatinine 1.56 on admission, normalized with IV fluids.  Metastatic colon cancer / sigmoid colon mass and  liver metastasis / abnormal liver function enzymes / hepatomegaly / intestinal obstruction  Status post colonoscopy on 12/30/2013 and pathology results showed high-grade dysplasia but no frank malignancy.  CT scan of abdomen/pelvis done on admission demonstrating a sigmoid mass suspicious for colon cancer with liver metastasis.  Elevated LFTs felt to be secondary to hepatic metastasis.  Patient is status post sigmoidoscopy with metal stenting to help with narrowing give risk for surgery and severe hepatomegaly--he isn't a surgical candidate.  Chemotherapy with FOLFOX started 01/04/2014 per Dr. [name]. Port-A-Cath was placed 01/02/2014.  Diarrhea  Patient developed diarrhea after chemotherapy. He had some blood with diarrhea. No further reports of bleeding but patient continues to have diarrhea. Abdominal x-ray 01/08/14 showed stent placed in the distal sigmoid colon with no evidence of bowel obstruction.  Diarrhea slowly improving with changing lomotil to scheduled dosing, but still feels weak.  C. difficile negative.   Leukocytosis  Likely stress demargination, no evidence of acute infection  Blood cultures so far show no growth.  Anemia of chronic disease  Secondary to malignancy and sequela of chemotherapy.  S/P 2 units of PRBCs 01/09/14.  Hypokalemia  Likely from GI losses. Continue to monitor and supplement.  Hyponatremia  Mild. Continue gentle IV fluids.  Essential hypertension   Continue bidil, metoprolol.   Dyslipidemia  Continue Pravachol 20 mg daily.   Depression and insomnia  Continue celexa and trazodone   Severe protein calorie malnutrition/loss of weight  Continue feeding supplementation, Marinol TID.  Dietitian consulted and recommendations noted.  Lactic acidosis   Elevated serum lactate noted on admission, resolved.  DVT Prophylaxis   Continue SCDs.    Code Status: Full. Family Communication: No family at bedside. Disposition Plan:  Home when stable.   IV Access:  Port-A-Cath   Procedures and diagnostic studies:   Ct Chest W Contrast 12/31/2013 1. Nonspecific small pulmonary nodules. These are likely new since the remote exam of 07/20/2010. Early pulmonary metastasis cannot be excluded. 2. Status post right sided aortic stent graft repair, without acute complication. 3. Cardiomegaly and left ventricular hypertrophy. 4. Multiple liver masses with suggestion of cirrhosis. This could represent metastatic disease and/or hepatocellular carcinoma/carcinomas. 5. Upper abdominal ascites. 6. Gynecomastia.   Dg Chest Port 1 View 01/02/2014 No pneumothorax status post left internal jugular Port-A-Cath placement. Distal tip is seen overlying expected position of cavoatrial junction.   Dg Abd 2 Views 01/02/2014 1. There is no bowel obstruction demonstrated. A partially collapsed sigmoid colonic stent graft is present within the true bony pelvis. 2. Gallstones    Dg Abd 2 Views 01/01/2014 Stent placement for treatment of colonic mass.   Dg C-arm 61-120 Min 01/01/2014 Stent placement for treatment of colonic mass.  Dg Fluoro Guide Cv Line Left 01/04/2014 Intraoperative images from Port-A-Cath placement as described.   Dg Chest Port 1 View 01/06/2014 No evidence of acute abnormality. Cardiomegaly with right-sided aortic arch stent graft. Mild right basilar atelectasis/scarring again noted.   Dg Abd 2 Views 01/08/2014 1. Stent placed in the distal sigmoid colon, as above. No evidence of bowel obstruction. 2. No pneumoperitoneum. 3. Cholelithiasis. 4. Hepatomegaly. 5. Additional incidental findings, as above.   Medical Consultants:    Dr. Burney Gauze, Oncology  Dr. Delfin Edis, GI.  Dr. Jim Like, Neurology  Dr. Greer Pickerel, Surgery  Anti-Infectives:    None.  Subjective:   [name] still reports feeling weak with 4-5 episodes of loose stools daily. His by mouth intake remains poor. He  does not have any reliable help at home and is uninsured so he does not have a payer source for home health care.  Objective:    Filed Vitals:   01/11/14 2037 01/12/14 0509 01/12/14 0652 01/12/14 1056  BP: 149/97 141/100 145/85 132/88  Pulse: 77 73  79  Temp: 98.1 F (36.7 C) 98.3 F (36.8 C)  98.1 F (36.7 C)  TempSrc: Oral Oral  Oral  Resp: 16 16  16   Height:      Weight:      SpO2: 99% 97%  95%    Intake/Output Summary (Last 24 hours) at 01/12/14 1249 Last data filed at 01/12/14 0900  Gross per 24 hour  Intake    790 ml  Output    575 ml  Net    215 ml    Exam: Gen:  NAD Cardiovascular:  RRR, III/VI systolic murmur Respiratory:  Lungs CTAB Gastrointestinal:  Abdomen soft, slightly distended, + BS Extremities:  No C/E/C   Data Reviewed:    Labs: Basic Metabolic Panel:  Recent Labs Lab 01/07/14 0500 01/08/14 0500 01/09/14 0530 01/10/14 0545 01/11/14 0550  NA 136* 132* 135* 138 134*  K 4.4 4.1 3.9 4.0 4.0  CL 96 93* 94* 95* 93*  CO2 29 27 27 28 29   GLUCOSE 100* 100* 108* 97 98  BUN 27* 26* 28* 29* 27*  CREATININE 1.13 1.02 1.13 1.18 1.11  CALCIUM 8.8 9.1 8.7 8.8 9.1   GFR Estimated Creatinine Clearance: 72.3 mL/min (by C-G formula based on Cr of 1.11). Liver Function Tests:  Recent Labs Lab 01/06/14 0518 01/07/14 0500 01/08/14 0500 01/09/14 0530 01/11/14 0550  AST 150* 113* 97* 86* 79*  ALT 36 31 28 23 19   ALKPHOS 357* 287* 307* 289* 306*  BILITOT  0.5 0.5 0.6 0.5 0.4  PROT 7.3 6.7 7.2 6.9 7.0  ALBUMIN 2.4* 2.2* 2.3* 2.2* 2.2*    CBC:  Recent Labs Lab 01/08/14 0500 01/09/14 0530 01/10/14 0545 01/11/14 0550 01/12/14 0530  WBC 14.0* 10.8* 13.2* 16.0* 13.6*  HGB 9.4* 8.6* 10.5* 10.4* 9.8*  HCT 28.0* 26.6* 31.1* 31.1* 30.4*  MCV 80.7 82.9 80.8 81.6 83.5  PLT 288 281 297 300 294   CBG:  Recent Labs Lab 01/07/14 0728 01/07/14 1240 01/07/14 2124 01/08/14 0734 01/08/14 1235  GLUCAP 115* 97 111* 103* 101*     Microbiology Recent Results (from the past 240 hour(s))  Culture, blood (routine x 2)     Status: None (Preliminary result)   Collection Time: 01/06/14  9:45 PM  Result Value Ref Range Status   Specimen Description BLOOD R ARM  Final   Special Requests BOTTLES DRAWN AEROBIC AND ANAEROBIC 10 CC EACH  Final   Culture  Setup Time   Final    01/07/2014 01:17 Performed at [facility]    Culture   Final           BLOOD CULTURE RECEIVED NO GROWTH TO DATE CULTURE WILL BE HELD FOR 5 DAYS BEFORE ISSUING A FINAL NEGATIVE REPORT Performed at [facility]    Report Status PENDING  Incomplete  Culture, blood (routine x 2)     Status: None (Preliminary result)   Collection Time: 01/06/14  9:50 PM  Result Value Ref Range Status   Specimen Description BLOOD L ARM  Final   Special Requests BOTTLES DRAWN AEROBIC AND ANAEROBIC 10CC EACH  Final   Culture  Setup Time   Final    01/07/2014 01:17 Performed at [facility]    Culture   Final           BLOOD CULTURE RECEIVED NO GROWTH TO DATE CULTURE WILL BE HELD FOR 5 DAYS BEFORE ISSUING A FINAL NEGATIVE REPORT Performed at [facility]    Report Status PENDING  Incomplete  Culture, Urine     Status: None   Collection Time: 01/06/14 10:50 PM  Result Value Ref Range Status   Specimen Description URINE, CLEAN CATCH  Final   Special Requests NONE  Final   Culture  Setup Time   Final    01/07/2014 08:59 Performed at [place] Performed at [facility]   Final   Culture NO GROWTH Performed at [facility]   Final   Report Status 01/08/2014 FINAL  Final  Clostridium Difficile by PCR     Status: None   Collection Time: 01/09/14  8:53 AM  Result Value Ref Range Status   C difficile by pcr NEGATIVE NEGATIVE Final    Comment: Performed at [facility]     Medications:   . aspirin EC  325 mg Oral Daily  . bismuth subsalicylate  30 mL Oral TID AC &  HS  . dronabinol  5 mg Oral TID AC  . feeding supplement (RESOURCE BREEZE)  1 Container Oral TID BM  . isosorbide-hydrALAZINE  1 tablet Oral TID  . metoprolol succinate  50 mg Oral Daily  . nicotine  7 mg Transdermal Daily  . pravastatin  20 mg Oral q1800  . saccharomyces boulardii  250 mg Oral BID   Continuous Infusions:   Time spent: 15 minutes.   LOS: 16 days   [facility] Pager [phone]. If unable to  reach me by pager, please call my cell phone at [phone].  *Please refer to [email or web address], password [email or web address] to get updated schedule on who will round on this patient, as hospitalists switch teams weekly. If 7PM-7AM, please contact night-coverage at [email or web address], password [email or web address] for any overnight needs.  01/12/2014, 12:49 PM

## 2014-01-13 DIAGNOSIS — K625 Hemorrhage of anus and rectum: Secondary | ICD-10-CM

## 2014-01-13 LAB — COMPREHENSIVE METABOLIC PANEL
ALT: 16 U/L (ref 0–53)
AST: 69 U/L — AB (ref 0–37)
Albumin: 2.1 g/dL — ABNORMAL LOW (ref 3.5–5.2)
Alkaline Phosphatase: 281 U/L — ABNORMAL HIGH (ref 39–117)
Anion gap: 14 (ref 5–15)
BUN: 24 mg/dL — ABNORMAL HIGH (ref 6–23)
CALCIUM: 9 mg/dL (ref 8.4–10.5)
CO2: 27 mEq/L (ref 19–32)
CREATININE: 1.06 mg/dL (ref 0.50–1.35)
Chloride: 93 mEq/L — ABNORMAL LOW (ref 96–112)
GFR calc Af Amer: 88 mL/min — ABNORMAL LOW (ref >90)
GFR calc non Af Amer: 76 mL/min — ABNORMAL LOW (ref >90)
Glucose, Bld: 108 mg/dL — ABNORMAL HIGH (ref 70–99)
Potassium: 3.5 mEq/L — ABNORMAL LOW (ref 3.7–5.3)
Sodium: 134 mEq/L — ABNORMAL LOW (ref 137–147)
Total Bilirubin: 0.3 mg/dL (ref 0.3–1.2)
Total Protein: 6.7 g/dL (ref 6.0–8.3)

## 2014-01-13 LAB — CBC
HCT: 31 % — ABNORMAL LOW (ref 39.0–52.0)
Hemoglobin: 10 g/dL — ABNORMAL LOW (ref 13.0–17.0)
MCH: 27.3 pg (ref 26.0–34.0)
MCHC: 32.3 g/dL (ref 30.0–36.0)
MCV: 84.7 fL (ref 78.0–100.0)
PLATELETS: 299 10*3/uL (ref 150–400)
RBC: 3.66 MIL/uL — AB (ref 4.22–5.81)
RDW: 20.1 % — ABNORMAL HIGH (ref 11.5–15.5)
WBC: 13.9 10*3/uL — ABNORMAL HIGH (ref 4.0–10.5)

## 2014-01-13 LAB — CULTURE, BLOOD (ROUTINE X 2)
Culture: NO GROWTH
Culture: NO GROWTH

## 2014-01-13 MED ORDER — ISOSORBIDE DINITRATE 30 MG PO TABS
30.0000 mg | ORAL_TABLET | Freq: Two times a day (BID) | ORAL | Status: DC
  Administered 2014-01-13 (×2): 30 mg via ORAL
  Filled 2014-01-13 (×4): qty 1

## 2014-01-13 MED ORDER — POTASSIUM CHLORIDE CRYS ER 20 MEQ PO TBCR
20.0000 meq | EXTENDED_RELEASE_TABLET | Freq: Two times a day (BID) | ORAL | Status: DC
  Administered 2014-01-13 – 2014-01-14 (×3): 20 meq via ORAL
  Filled 2014-01-13 (×4): qty 1

## 2014-01-13 MED ORDER — DIPHENOXYLATE-ATROPINE 2.5-0.025 MG PO TABS
2.0000 | ORAL_TABLET | Freq: Four times a day (QID) | ORAL | Status: DC
  Administered 2014-01-13 – 2014-01-14 (×5): 2 via ORAL
  Filled 2014-01-13 (×5): qty 2

## 2014-01-13 NOTE — Progress Notes (Signed)
Progress Note   Denver Bentson ZHY:[id number] DOB: Nov 19, 1955 DOA: 12/27/2013 PCP: No primary care provider on file.   Brief Narrative:   [name] is an 58 y.o. male with a PMH of hypertension, dyslipidemia, history of CVA, aortic stenosis and repair of aortic aneurysm at [facility] who was admitted 12/27/13 with a chief complaint of dizziness, lightheadedness, unintentional weight loss of more than 30 pounds, abdominal discomfort. A CT abdomen/pelvis was done and demonstrated a sigmoid mass  suspicious for colon cancer with liver metastasis GI was consulted and a colonoscopy was obtained for tissue sample and revealed almost complete obstruction of colon. Surgery has placed stent for colonic mass and has placed port-a-cath.  Chemotherapy was initiated 01/04/2014. Additionally, patient was found to have new acute and subacute cortical and subcortical CVA. Barrier to discharge is ongoing diarrhea and generalized weakness with poor social circumstances/no significant help or insurance to pay for home health care.  Although he seems medically stable to go home (hemoglobin stable despite reports of occasional blood on the stool, which is brown in color), Dr. [name] feels he needs ongoing hospitalization secondary to diarrhea and blood on the stools.  Assessment/Plan:    Principal Problem: Acute and subacute punctate cortical and subcortical stroke with dizziness and lightheadedness/cerebral embolism with cerebral infarction  Diagnosed 12/28/13 on MRI. Pt with risk factors including hypertension, dyslipidemia and evidence of prior strokes on MRI brain.  Seen by neurologist 12/28/13 with further recommendations for echocardiogram/carotid Dopplers.  Continue aspirin daily for secondary stroke prevention.  No focal neurologic deficitsappreciated, but patient continues to feel weak.  Unfortunately, the patient is uninsured and will not be able to get home PT. We'll set up home health RN for safety  evaluation.   Active Problems: MRSA carrier  Decontamination therapy ordered. On contact isolation.  Acute kidney injury  Creatinine 1.56 on admission, normalized with IV fluids.  Metastatic colon cancer / sigmoid colon mass and liver metastasis / abnormal liver function enzymes / hepatomegaly / intestinal obstruction  Status post colonoscopy on 12/30/2013 and pathology results showed high-grade dysplasia but no frank malignancy.  CT scan of abdomen/pelvis done on admission demonstrating a sigmoid mass suspicious for colon cancer with liver metastasis.  Elevated LFTs felt to be secondary to hepatic metastasis.  Patient is status post sigmoidoscopy with metal stenting to help with narrowing give risk for surgery and severe hepatomegaly--he isn't a surgical candidate.  S/P dhemotherapy with FOLFOX 01/04/2014 per Dr. [name]. Port-A-Cath was placed 01/02/2014.  Diarrhea  Patient developed diarrhea after chemotherapy. He had some blood with diarrhea.  Abdominal x-ray 01/08/14 showed stent placed in the distal sigmoid colon with no evidence of bowel obstruction.  C. difficile negative.   Describes stools as brown with a pudding consistency, with occasional blood on the stools (on ASA secondary to h/o recent stroke).  Despite reports of blood on the stools, hemoglobin actually up, so I suspect this is mild lower GI bleeding from an internal hemorrhoid or from the stent in the setting of ASA use.  Currently on Pepto Bismol per Dr. [name].  Will resume Lomotil QID.  Leukocytosis  Likely stress demargination, no evidence of acute infection  Blood cultures negative with no evidence of infection.  Anemia of chronic disease  Secondary to malignancy and sequela of chemotherapy.  S/P 2 units of PRBCs 01/09/14.  Hemoglobin stable.  Hypokalemia  Likely from GI losses. Continue to monitor and supplement.  Hyponatremia  Mild.   Essential hypertension   Continue  bidil, metoprolol.    Dyslipidemia  Continue Pravachol 20 mg daily.   Depression and insomnia  Continue celexa and trazodone   Severe protein calorie malnutrition/loss of weight  Continue feeding supplementation, Marinol TID.  Dietitian consulted and recommendations noted.  Lactic acidosis   Elevated serum lactate noted on admission, resolved.  DVT Prophylaxis   Continue SCDs.    Code Status: Full. Family Communication: No family at bedside. Disposition Plan: Home when stable.   IV Access:    Port-A-Cath   Procedures and diagnostic studies:   Ct Chest W Contrast 12/31/2013 1. Nonspecific small pulmonary nodules. These are likely new since the remote exam of 07/20/2010. Early pulmonary metastasis cannot be excluded. 2. Status post right sided aortic stent graft repair, without acute complication. 3. Cardiomegaly and left ventricular hypertrophy. 4. Multiple liver masses with suggestion of cirrhosis. This could represent metastatic disease and/or hepatocellular carcinoma/carcinomas. 5. Upper abdominal ascites. 6. Gynecomastia.   Dg Chest Port 1 View 01/02/2014 No pneumothorax status post left internal jugular Port-A-Cath placement. Distal tip is seen overlying expected position of cavoatrial junction.   Dg Abd 2 Views 01/02/2014 1. There is no bowel obstruction demonstrated. A partially collapsed sigmoid colonic stent graft is present within the true bony pelvis. 2. Gallstones    Dg Abd 2 Views 01/01/2014 Stent placement for treatment of colonic mass.   Dg C-arm 61-120 Min 01/01/2014 Stent placement for treatment of colonic mass.  Dg Fluoro Guide Cv Line Left 01/04/2014 Intraoperative images from Port-A-Cath placement as described.   Dg Chest Port 1 View 01/06/2014 No evidence of acute abnormality. Cardiomegaly with right-sided aortic arch stent graft. Mild right basilar atelectasis/scarring again noted.   Dg Abd 2 Views 01/08/2014 1. Stent placed in the distal  sigmoid colon, as above. No evidence of bowel obstruction. 2. No pneumoperitoneum. 3. Cholelithiasis. 4. Hepatomegaly. 5. Additional incidental findings, as above.   Medical Consultants:    Dr. Burney Gauze, Oncology  Dr. Delfin Edis, GI.  Dr. Jim Like, Neurology  Dr. Greer Pickerel, Surgery  Anti-Infectives:    None.  Subjective:   [name] did not have any diarrhea overnight, but reported a pudding thick stool this a.m.  The stool was described as brown, and when asked if he is having blood in his stools, he says "at times".  He still feels weak.  No dyspnea.  Objective:    Filed Vitals:   01/12/14 1056 01/12/14 1353 01/12/14 2136 01/13/14 0500  BP: 132/88 140/90 108/79 131/95  Pulse: 79 74 76 74  Temp: 98.1 F (36.7 C) 97.4 F (36.3 C) 98.9 F (37.2 C) 97.5 F (36.4 C)  TempSrc: Oral Oral Oral Oral  Resp: 16 16 16 16   Height:      Weight:      SpO2: 95% 98% 99% 98%    Intake/Output Summary (Last 24 hours) at 01/13/14 0932 Last data filed at 01/13/14 0617  Gross per 24 hour  Intake    360 ml  Output   1575 ml  Net  -1215 ml    Exam: Gen:  NAD Cardiovascular:  RRR, III/VI systolic murmur Respiratory:  Lungs CTAB Gastrointestinal:  Abdomen soft, slightly distended, + BS Extremities:  No C/E/C   Data Reviewed:    Labs: Basic Metabolic Panel:  Recent Labs Lab 01/08/14 0500 01/09/14 0530 01/10/14 0545 01/11/14 0550 01/13/14 0515  NA 132* 135* 138 134* 134*  K 4.1 3.9 4.0 4.0 3.5*  CL 93* 94* 95* 93* 93*  CO2 27  27 28 29 27   GLUCOSE 100* 108* 97 98 108*  BUN 26* 28* 29* 27* 24*  CREATININE 1.02 1.13 1.18 1.11 1.06  CALCIUM 9.1 8.7 8.8 9.1 9.0   GFR Estimated Creatinine Clearance: 75.7 mL/min (by C-G formula based on Cr of 1.06). Liver Function Tests:  Recent Labs Lab 01/07/14 0500 01/08/14 0500 01/09/14 0530 01/11/14 0550 01/13/14 0515  AST 113* 97* 86* 79* 69*  ALT 31 28 23 19 16   ALKPHOS 287* 307* 289* 306* 281*    BILITOT 0.5 0.6 0.5 0.4 0.3  PROT 6.7 7.2 6.9 7.0 6.7  ALBUMIN 2.2* 2.3* 2.2* 2.2* 2.1*    CBC:  Recent Labs Lab 01/09/14 0530 01/10/14 0545 01/11/14 0550 01/12/14 0530 01/13/14 0515  WBC 10.8* 13.2* 16.0* 13.6* 13.9*  HGB 8.6* 10.5* 10.4* 9.8* 10.0*  HCT 26.6* 31.1* 31.1* 30.4* 31.0*  MCV 82.9 80.8 81.6 83.5 84.7  PLT 281 297 300 294 299   CBG:  Recent Labs Lab 01/07/14 0728 01/07/14 1240 01/07/14 2124 01/08/14 0734 01/08/14 1235  GLUCAP 115* 97 111* 103* 101*    Microbiology Recent Results (from the past 240 hour(s))  Culture, blood (routine x 2)     Status: None (Preliminary result)   Collection Time: 01/06/14  9:45 PM  Result Value Ref Range Status   Specimen Description BLOOD R ARM  Final   Special Requests BOTTLES DRAWN AEROBIC AND ANAEROBIC 10 CC EACH  Final   Culture  Setup Time   Final    01/07/2014 01:17 Performed at [facility]    Culture   Final           BLOOD CULTURE RECEIVED NO GROWTH TO DATE CULTURE WILL BE HELD FOR 5 DAYS BEFORE ISSUING A FINAL NEGATIVE REPORT Performed at [facility]    Report Status PENDING  Incomplete  Culture, blood (routine x 2)     Status: None (Preliminary result)   Collection Time: 01/06/14  9:50 PM  Result Value Ref Range Status   Specimen Description BLOOD L ARM  Final   Special Requests BOTTLES DRAWN AEROBIC AND ANAEROBIC 10CC EACH  Final   Culture  Setup Time   Final    01/07/2014 01:17 Performed at [facility]    Culture   Final           BLOOD CULTURE RECEIVED NO GROWTH TO DATE CULTURE WILL BE HELD FOR 5 DAYS BEFORE ISSUING A FINAL NEGATIVE REPORT Performed at [facility]    Report Status PENDING  Incomplete  Culture, Urine     Status: None   Collection Time: 01/06/14 10:50 PM  Result Value Ref Range Status   Specimen Description URINE, CLEAN CATCH  Final   Special Requests NONE  Final   Culture  Setup Time   Final    01/07/2014 08:59 Performed at [place] Performed at [facility]   Final   Culture NO GROWTH Performed at [facility]   Final   Report Status 01/08/2014 FINAL  Final  Clostridium Difficile by PCR     Status: None   Collection Time: 01/09/14  8:53 AM  Result Value Ref Range Status   C difficile by pcr NEGATIVE NEGATIVE Final    Comment: Performed at [facility]     Medications:   . aspirin EC  325 mg Oral Daily  . bismuth subsalicylate  30 mL Oral TID AC & HS  .  dronabinol  5 mg Oral TID AC  . feeding supplement (RESOURCE BREEZE)  1 Container Oral TID BM  . isosorbide dinitrate  30 mg Oral BID  . metoprolol succinate  50 mg Oral Daily  . nicotine  7 mg Transdermal Daily  . pravastatin  20 mg Oral q1800  . saccharomyces boulardii  250 mg Oral BID   Continuous Infusions:   Time spent: 15 minutes.   LOS: 17 days   [facility] Pager [phone]. If unable to reach me by pager, please call my cell phone at [phone].  *Please refer to [email or web address], password [email or web address] to get updated schedule on who will round on this patient, as hospitalists switch teams weekly. If 7PM-7AM, please contact night-coverage at [email or web address], password [email or web address] for any overnight needs.  01/13/2014, 9:32 AM

## 2014-01-13 NOTE — Progress Notes (Signed)
[name] says he still having the diarrhea. He now says he is having rectal bleeding. I'm sure that the rectal bleeding is, from his colonic mass.  We've adjusted his dietary intake. He's off Ensure. I have him on some Kaopectate.  Again, he had one episode of rectal bleeding today. He is not on blood thinner. He is on aspirin. I would keep him on aspirin because of his neurological issues.  His hemoglobin is 10. He did get IV iron couple days ago. Again, I suspect that the bleeding is from the mass. If he continues to have bleeding today, then he probably needs to have another endoscopy to see what is going on.  There is no obvious obstruction. He has no complaints of pain.  His labs look better. Alkaline phosphatase is coming down. His liver enzymes are also improving.  His blood pressure is 131/95. He is afebrile. Pulse is only 74.  There is no change in his physical exam.  I would really like to hold that he go home tomorrow. The problem now is this rectal bleeding. We will have to see what his blood count is tomorrow.  I appreciate all the great care that he is getting on the floor.  Lum Keas

## 2014-01-14 LAB — CBC
HCT: 32.7 % — ABNORMAL LOW (ref 39.0–52.0)
Hemoglobin: 10.5 g/dL — ABNORMAL LOW (ref 13.0–17.0)
MCH: 27.6 pg (ref 26.0–34.0)
MCHC: 32.1 g/dL (ref 30.0–36.0)
MCV: 85.8 fL (ref 78.0–100.0)
Platelets: 320 10*3/uL (ref 150–400)
RBC: 3.81 MIL/uL — AB (ref 4.22–5.81)
RDW: 20.4 % — ABNORMAL HIGH (ref 11.5–15.5)
WBC: 15.6 10*3/uL — AB (ref 4.0–10.5)

## 2014-01-14 LAB — COMPREHENSIVE METABOLIC PANEL
ALT: 16 U/L (ref 0–53)
AST: 72 U/L — AB (ref 0–37)
Albumin: 2.2 g/dL — ABNORMAL LOW (ref 3.5–5.2)
Alkaline Phosphatase: 305 U/L — ABNORMAL HIGH (ref 39–117)
Anion gap: 14 (ref 5–15)
BILIRUBIN TOTAL: 0.4 mg/dL (ref 0.3–1.2)
BUN: 21 mg/dL (ref 6–23)
CHLORIDE: 93 meq/L — AB (ref 96–112)
CO2: 26 meq/L (ref 19–32)
CREATININE: 1.08 mg/dL (ref 0.50–1.35)
Calcium: 9 mg/dL (ref 8.4–10.5)
GFR calc non Af Amer: 74 mL/min — ABNORMAL LOW (ref >90)
GFR, EST AFRICAN AMERICAN: 86 mL/min — AB (ref >90)
GLUCOSE: 123 mg/dL — AB (ref 70–99)
Potassium: 3.4 mEq/L — ABNORMAL LOW (ref 3.7–5.3)
Sodium: 133 mEq/L — ABNORMAL LOW (ref 137–147)
Total Protein: 7 g/dL (ref 6.0–8.3)

## 2014-01-14 MED ORDER — POTASSIUM CHLORIDE CRYS ER 20 MEQ PO TBCR: 20.0000 meq | EXTENDED_RELEASE_TABLET | Freq: Two times a day (BID) | ORAL | Status: DC

## 2014-01-14 MED ORDER — ISOSORBIDE DINITRATE 40 MG PO TABS: 40.0000 mg | ORAL_TABLET | Freq: Two times a day (BID) | ORAL | Status: DC

## 2014-01-14 MED ORDER — BOOST / RESOURCE BREEZE PO LIQD: 1.0000 | Freq: Three times a day (TID) | ORAL | Status: DC

## 2014-01-14 MED ORDER — DRONABINOL 5 MG PO CAPS: 5.0000 mg | ORAL_CAPSULE | Freq: Three times a day (TID) | ORAL | Status: DC

## 2014-01-14 MED ORDER — FOLIC ACID 1 MG PO TABS: 1.0000 mg | ORAL_TABLET | Freq: Every day | ORAL | Status: DC

## 2014-01-14 MED ORDER — HEPARIN SOD (PORK) LOCK FLUSH 100 UNIT/ML IV SOLN: 500.0000 [IU] | Freq: Once | INTRAVENOUS | Status: DC

## 2014-01-14 MED ORDER — ASPIRIN 325 MG PO TBEC: 325.0000 mg | DELAYED_RELEASE_TABLET | Freq: Every day | ORAL | Status: DC

## 2014-01-14 MED ORDER — PRAVASTATIN SODIUM 20 MG PO TABS: 20.0000 mg | ORAL_TABLET | Freq: Every day | ORAL | Status: DC

## 2014-01-14 MED ORDER — DIPHENOXYLATE-ATROPINE 2.5-0.025 MG PO TABS: 2.0000 | ORAL_TABLET | Freq: Four times a day (QID) | ORAL | Status: DC

## 2014-01-14 MED ORDER — TRAZODONE HCL 50 MG PO TABS: 50.0000 mg | ORAL_TABLET | Freq: Every evening | ORAL | Status: DC | PRN

## 2014-01-14 MED ORDER — AMLODIPINE BESYLATE 5 MG PO TABS: 5.0000 mg | ORAL_TABLET | Freq: Every day | ORAL | Status: DC

## 2014-01-14 MED ORDER — SACCHAROMYCES BOULARDII 250 MG PO CAPS: 250.0000 mg | ORAL_CAPSULE | Freq: Two times a day (BID) | ORAL | Status: DC

## 2014-01-14 MED ORDER — HYDRALAZINE HCL 25 MG PO TABS: 25.0000 mg | ORAL_TABLET | Freq: Three times a day (TID) | ORAL | Status: DC

## 2014-01-14 MED ORDER — ISOSORBIDE DINITRATE 20 MG PO TABS
40.0000 mg | ORAL_TABLET | Freq: Two times a day (BID) | ORAL | Status: DC
  Administered 2014-01-14: 40 mg via ORAL
  Filled 2014-01-14 (×2): qty 2

## 2014-01-14 MED ORDER — HEPARIN SOD (PORK) LOCK FLUSH 100 UNIT/ML IV SOLN
500.0000 [IU] | INTRAVENOUS | Status: DC | PRN
Start: 2014-01-14 — End: 2014-01-14

## 2014-01-14 MED ORDER — OXYCODONE HCL 5 MG PO TABS: 5.0000 mg | ORAL_TABLET | ORAL | Status: DC | PRN

## 2014-01-14 MED ORDER — BISMUTH SUBSALICYLATE 262 MG/15ML PO SUSP: 30.0000 mL | Freq: Three times a day (TID) | ORAL | Status: DC

## 2014-01-14 MED ORDER — HYDRALAZINE HCL 25 MG PO TABS
25.0000 mg | ORAL_TABLET | Freq: Three times a day (TID) | ORAL | Status: DC
  Filled 2014-01-14 (×3): qty 1

## 2014-01-14 MED ORDER — FOLIC ACID 1 MG PO TABS
1.0000 mg | ORAL_TABLET | Freq: Every day | ORAL | Status: DC
  Administered 2014-01-14: 1 mg via ORAL
  Filled 2014-01-14: qty 1

## 2014-01-14 MED ORDER — METOPROLOL SUCCINATE ER 50 MG PO TB24: 50.0000 mg | ORAL_TABLET | Freq: Every day | ORAL | Status: DC

## 2014-01-14 NOTE — Progress Notes (Signed)
Patient discharged to home, all discharge medications and instructions reviewed and questions answered. Patient to be assisted to vehicle by wheelchair when ride arrives.  

## 2014-01-14 NOTE — Progress Notes (Signed)
[name] is doing okay. I think the diarrhea seems to be a little bit better. He says that still is blood per rectum. His hemoglobin continues to improve. It is 10.5 today.  He is eating a little bit better. His abdomen is not as distended.  Is having some blood pressure issues.  There is no pain. He's had no shortness of breath. There is no dizziness. He is walking.  On his physical exam, his blood pressure is 151/106. Pulse is 70. Temperature 97.5. His lungs are clear. Cardiac exam regular in rhythm. He has a 3/6 systolic murmur. Abdomen is slightly distended. His liver does not appear to be as prominent. He has decent bowel sounds. Extremity shows no clubbing, cyanosis or edema.  On his labs, BUN is 21 and creatinine 1.08. Potassium 3.4. White cell count 15.6. Platelet count 320.  From my point of view, I think he be discharged area and we can follow him as an outpatient. His blood pressure will be a problem. Hopefully this can be under better control.  I will plan to get him back into the office for his second cycle of chemotherapy which will be next week.  I very much appreciate all the gray care that he is gotten.  Ramond Dial 2:11

## 2014-01-14 NOTE — Plan of Care (Signed)
Problem: Phase II Progression Outcomes Goal: Progress activity as tolerated unless otherwise ordered Outcome: Completed/Met Date Met:  01/14/14     

## 2014-01-14 NOTE — Care Management Note (Signed)
CARE MANAGEMENT NOTE 01/14/2014  Patient:  [name], [name]   Account Number:  [id number]  Date Initiated:  12/28/2013  Documentation initiated by:  Tomi Bamberger  Subjective/Objective Assessment:   dx lactic acidosis, tia  admit as observation- from home     Action/Plan:   11/20 on clears  pt eval- rec hhpt- pt has no insurance.   Anticipated DC Date:  01/11/2014   Anticipated DC Plan:  [place]  CM consult  Follow-up appt scheduled      [facility] Choice  HOME HEALTH   Choice offered to / List presented to:  C-1 Patient        [place] arranged  HH-1 RN  [place].   Status of service:  In process, will continue to follow Medicare Important Message given?  NO (If response is "NO", the following Medicare IM given date fields will be blank) Date Medicare IM given:   Medicare IM given by:   Date Additional Medicare IM given:   Additional Medicare IM given by:    Discharge Disposition:    Per UR Regulation:  Reviewed for med. necessity/level of care/duration of stay  If discussed at [place] of Stay Meetings, dates discussed:   01/08/2014  01/10/2014    Comments:  01/14/14 Marney Doctor RN,BSN,NCM HHRN/PT/OT/Aide orders written and [facility] alerted of pts DC. No other DC needs noted.  01/10/14 Marney Doctor RN,BSN,NCM [phone] Pts wife called this CM on 01/09/14 and states that pt needs housing information due to them being separated and him not being able to stay with her long term at [place].  List of homeless shelters given to pt and informed wife that she would need to follow up with DSS about other housing options if he has no where else to go long term. HH orders written.  [facility] rep following.  CM will continue to follow for DC needs.  01/08/14 Marney Doctor RN,BSN,NCM [phone] PT is recommending HHPT.  Will need MD orders for [facility] for safety eval due to pt being self pay.  MD  informed.  CM will continue to follow.  12/31/13 [place] BSN [phone] patient has apt scheduled on 12/4 at 1:30 at [facility] on [address].  He has a orange card for meds as well.  12/28/13 [place], BSN [phone] NCM spoke with patient he has an orange card  for his med, he goes to [facility] on [address].

## 2014-01-14 NOTE — Discharge Instructions (Signed)
Chemotherapy Many people are apprehensive about chemotherapy due to concerns over uncomfortable side effects. However, managements for side effects have come a long way. Many side effects once associated with chemotherapy can be prevented and/or controlled. WHAT IS CHEMOTHERAPY? Chemotherapy is the general term for any treatment involving the use of chemical agents. Chemotherapy can be given through a vein, most commonly through an implanted port* or PICC line.* It can also be delivered by mouth (orally) in the form of a pill. The main goal of chemotherapy is to kill cancer cells and stop them from growing. It can destroy and eliminate cancer cells where the cancer started (primary tumor location) and throughout the body, often far away from the original cancer. It is a treatment that not only targets the original cancer location, but also the entire body (systemic treatment) for full effect and results. Chemotherapy works by destroying cancer cells. Unfortunately, it cannot tell the difference between a cancer cell and some healthy cells. This results in the death of noncancerous cells, such as hair and blood cells. Harm to healthy cells is what causes side effects. These cells usually repair themselves after chemotherapy. Because some drugs work better together rather than alone, 2 or more drugs are often given at the same time. This is called combination chemotherapy. Depending on the type of cancer and how advanced it is, chemotherapy can be used for different goals:  Cure the cancer.  Keep the cancer from spreading.  Slow the cancer's growth.  Kill cancer cells that may have spread to other parts of the body from the original tumor.  Relieve symptoms caused by cancer. You and your caregiver will decide what drug or combination of drugs you will get. Your caregiver will choose the doses, how the drugs will be given, how often, and how long you will get treatment. All of these decisions will  depend on the type of cancer, where it is, how big it is, and how it is affecting your normal body functions and overall health. *Implanted port - A device that is implanted under your skin so that medicines may be delivered directly into your blood system. *PICC line (peripherally inserted central catheter) - A long, slender, flexible tube. This tube is often inserted into a vein, typically in the upper arm. The tip stops in the large central vein that leads to your heart. Document Released: 11/22/2006 Document Revised: 04/19/2011 Document Reviewed: 05/09/2008 [facility] Patient Information 2015 [place], [place]. This information is not intended to replace advice given to you by your health care provider. Make sure you discuss any questions you have with your health care provider. Colorectal Cancer Colorectal cancer is an abnormal growth of tissue (tumor) in the colon or rectum that is cancerous (malignant). Unlike noncancerous (benign) tumors, malignant tumors can spread to other parts of your body. The colon is the large bowel or large intestine. The rectum is the last several inches of the colon.  RISK FACTORS The exact cause of colorectal cancer is unknown. However, the following factors may increase your chances of getting colorectal cancer:   Age older than 61 years.   Abnormal growths (polyps) on the inner wall of the colon or rectum.   Diabetes.   African American race.   Family history of hereditary nonpolyposis colorectal cancer. This condition is caused by changes in the genes that are responsible for repairing mismatched DNA.   Personal history of cancer. A person who has already had colorectal cancer may develop it a second time.  Also, women with a history of ovarian, uterine, or breast cancer are at a somewhat higher risk of developing colorectal cancer.  Certain hereditary conditions.  Eating a diet that is high in fat (especially animal fat) and low in fiber, fruits, and  vegetables.  Sedentary lifestyle.  Inflammatory bowel disease, including ulcerative colitis and Crohn's disease.   Smoking.   Excessive alcohol use.  SYMPTOMS Early colorectal cancer often does not cause symptoms. As the cancer grows, symptoms may include:   Changes in bowel habits.  Diarrhea.   Constipation.   Feeling like the bowel does not empty completely after a bowel movement.   Blood in the stool.   Stools that are narrower than usual.   Abdominal discomfort, pain, bloating, fullness, or cramps.  Frequent gas pain.   Unexplained weight loss.   Constant tiredness.   Nausea and vomiting.  DIAGNOSIS  Your health care provider will ask about your medical history. He or she may also perform a number of procedures, such as:   A physical exam.  A digital rectal exam.  A fecal occult blood test.  A barium enema.  Blood tests.   X-rays.   Imaging tests, such as CT scans or MRIs.   Taking a tissue sample (biopsy) from your colon or rectum to look for cancer cells.   A sigmoidoscopy to view the inside of the last part of your colon.   A colonoscopy to view the inside of your entire colon.   An endorectal ultrasound to see how deep a rectal tumor has grown and whether the cancer has spread to lymph nodes or other nearby tissues.  Your cancer will be staged to determine its severity and extent. Staging is a careful attempt to find out the size of the tumor, whether the cancer has spread, and if so, to what parts of the body. You may need to have more tests to determine the stage of your cancer. The test results will help determine what treatment plan is best for you.   Stage 0. The cancer is found only in the innermost lining of the colon or rectum.   Stage I. The cancer has grown into the inner wall of the colon or rectum. The cancer has not yet reached the outer wall of the colon.   Stage II. The cancer extends more deeply into or  through the wall of the colon or rectum. It may have invaded nearby tissue, but cancer cells have not spread to the lymph nodes.   Stage III. The cancer has spread to nearby lymph nodes but not to other parts of the body.   Stage IV. The cancer has spread to other parts of the body, such as the liver or lungs.  Your health care provider may tell you the detailed stage of your cancer, which includes both a number and a letter.  TREATMENT  Depending on the type and stage, colorectal cancer may be treated with surgery, radiation therapy, chemotherapy, targeted therapy, or radiofrequency ablation. Some people have a combination of these therapies. Surgery may be done to remove the polyps from your colon. In early stages, your health care provider may be able to do this during a colonoscopy. In later stages, surgery may be done to remove part of your colon.  HOME CARE INSTRUCTIONS   Take medicines only as directed by your health care provider.   Maintain a healthy diet.   Consider joining a support group. This may help you learn to cope  with the stress of having colorectal cancer.   Seek advice to help you manage treatment of side effects.   Keep all follow-up visits as directed by your health care provider.   Inform your cancer specialist if you are admitted to the hospital.  SEEK MEDICAL CARE IF:  Your diarrhea or constipation does not go away.   Your bowel habits change.  You have increased abdominal pain.   You notice new fatigue or weakness.  You lose weight. Document Released: 01/25/2005 Document Revised: 06/11/2013 Document Reviewed: 07/20/2012 [facility] Patient Information 2015 [place], [place]. This information is not intended to replace advice given to you by your health care provider. Make sure you discuss any questions you have with your health care provider.

## 2014-01-14 NOTE — Progress Notes (Signed)
On call provider notified and responded about elevate blood pressure. Pt asymptomatic. No new orders at this time. Will continue to monitor.

## 2014-01-14 NOTE — Discharge Summary (Signed)
Physician Discharge Summary  [name] WER:[id number] DOB: [birth year] DOA: 12/27/2013  PCP: No primary care provider on file.  Admit date: 12/27/2013 Discharge date: 01/14/2014  Recommendations for Outpatient Follow-up:  1. For blood pressure please take Norvasc 5 mg daily, hydralazine 25 mg 3 x daily, imdur 40 mg 2 x daily and metoprolol 50 mg 2 x daily as prescribed. 2. Please follow-up with cancer Center per scheduled appointment    Increase activity slowly    Complete by:  As directed    TREATMENT CONDITIONS    Complete by:  As directed  Patient should have CBC & CMP within 7 days prior to chemotherapy administration. NOTIFY MD IF: ANC < 1500, Hemoglobin < 8, PLT < 100,000,  Total Bili > 1.5, Creatinine > 1.5, ALT & AST > 80 or if patient has unstable vital signs: Temperature > 38.5, SBP > 180 or < 90, RR > 30 or HR > 100.       Discharge Diagnoses:  Principal Problem:   AKI (acute kidney injury) Active Problems:   Lactic acidosis   Cerebral embolism with cerebral infarction   Abnormal LFTs   Leucocytosis   Dizziness and giddiness   Abdominal mass   Loss of weight   Acute kidney injury   Hepatomegalia   HLD (hyperlipidemia)   Malignant neoplasm of colon   Intestinal obstruction   Colon cancer metastasized to liver   Protein-calorie malnutrition, severe   Benign essential HTN   Colonic obstruction   Abdominal distension   MRSA carrier   Diarrhea   Weakness    Discharge Condition: stable   Diet recommendation: as tolerated   History of present illness:  58 y.o. male with a PMH of hypertension, dyslipidemia, history of CVA, aortic stenosis and repair of aortic aneurysm at [facility] who was admitted 12/27/13 with a chief complaint of dizziness, lightheadedness, unintentional weight loss of more than 30 pounds, abdominal discomfort. A CT abdomen/pelvis was done and demonstrated a sigmoid mass suspicious for colon cancer with liver metastasis GI was consulted and a  colonoscopy was obtained for tissue sample and revealed almost complete obstruction of colon. Surgery has placed stent for colonic mass and has placed port-a-cath. Chemotherapy was initiated 01/04/2014. Additionally, patient was found to have new acute and subacute cortical and subcortical CVA. Barrier to discharge is ongoing diarrhea and generalized weakness with poor social circumstances/no significant help or insurance to pay for home health care. Although he seems medically stable to go home (hemoglobin stable despite reports of occasional blood on the stool, which is brown in color), Dr. [name] feels he needs ongoing hospitalization secondary to diarrhea and blood in the stools.  Assessment/Plan:    Principal Problem: Acute and subacute punctate cortical and subcortical stroke with dizziness and lightheadedness/cerebral embolism with cerebral infarction  Diagnosed 12/28/13 on MRI. Pt with risk factors including hypertension, dyslipidemia and evidence of prior strokes on MRI brain.  Seen by neurologist 12/28/13 with further recommendations for echocardiogram/carotid Dopplers.  Continue aspirin daily for secondary stroke prevention.  No focal neurologic deficitsappreciated, but patient continues to feel weak.  Unfortunately, the patient is uninsured and will not be able to get home PT. Hom eheawlth orders in place. RN for safety evaluation   Active Problems: MRSA carrier  Decontamination therapy ordered. On contact isolation.  Acute kidney injury  Creatinine 1.56 on admission, normalized with IV fluids.  Metastatic colon cancer / sigmoid colon mass and liver metastasis / abnormal liver function enzymes / hepatomegaly / intestinal obstruction  Status post colonoscopy on 12/30/2013 and pathology results showed high-grade dysplasia but no frank malignancy.  CT scan of abdomen/pelvis done on admission demonstrating a sigmoid mass suspicious for colon cancer with liver  metastasis. Elevated LFTs felt to be secondary to hepatic metastasis.  Patient is status post sigmoidoscopy with metal stenting to help with narrowing give risk for surgery and severe hepatomegaly--he isn't a surgical candidate.  S/P dhemotherapy with FOLFOX 01/04/2014 per Dr. [name]. Port-A-Cath was placed 01/02/2014.  Diarrhea  Patient developed diarrhea after chemotherapy. He had some blood with diarrhea. Abdominal x-ray 01/08/14 showed stent placed in the distal sigmoid colon with no evidence of bowel obstruction.  C. difficile negative.   Occasional blood on the stools (on ASA secondary to h/o recent stroke). Despite reports of blood on the stools, hemoglobin actually up, so suspect this is mild lower GI bleeding from an internal hemorrhoid or from the stent in the setting of ASA use.  Currently on Pepto Bismol per Dr. [name]. May continue lomotil on discharge.   Leukocytosis  Likely stress demargination, no evidence of acute infection  Blood cultures negative with no evidence of infection.  Anemia of chronic disease  Secondary to malignancy and sequela of chemotherapy.  S/P 2 units of PRBCs 01/09/14. Hemoglobin stable.  Hypokalemia  Likely from GI losses. Continue on discharge for 2 weeks and will have follow up appt to recheck potassium level to see if can be stopped.   Hyponatremia  Mild.  Essential hypertension   Continue bidil, metoprolol.  Added hydralazine and Norvasc for better BP control. SBP in 150-160 range   Dyslipidemia  Continue Pravachol 20 mg daily.  Depression and insomnia  Continue celexa and trazodone  Severe protein calorie malnutrition/loss of weight  Continue feeding supplementation, Marinol TID.  Dietitian consulted and recommendations noted.  Lactic acidosis   Elevated serum lactate noted on admission, resolved.  DVT Prophylaxis   Continue SCDs.    Code Status: Full. Family Communication: No family at  bedside.    IV Access:    Port-A-Cath   Procedures and diagnostic studies:   Ct Chest W Contrast 12/31/2013 1. Nonspecific small pulmonary nodules. These are likely new since the remote exam of 07/20/2010. Early pulmonary metastasis cannot be excluded. 2. Status post right sided aortic stent graft repair, without acute complication. 3. Cardiomegaly and left ventricular hypertrophy. 4. Multiple liver masses with suggestion of cirrhosis. This could represent metastatic disease and/or hepatocellular carcinoma/carcinomas. 5. Upper abdominal ascites. 6. Gynecomastia.   Dg Chest Port 1 View 01/02/2014 No pneumothorax status post left internal jugular Port-A-Cath placement. Distal tip is seen overlying expected position of cavoatrial junction.   Dg Abd 2 Views 01/02/2014 1. There is no bowel obstruction demonstrated. A partially collapsed sigmoid colonic stent graft is present within the true bony pelvis. 2. Gallstones    Dg Abd 2 Views 01/01/2014 Stent placement for treatment of colonic mass.   Dg C-arm 61-120 Min 01/01/2014 Stent placement for treatment of colonic mass.  Dg Fluoro Guide Cv Line Left 01/04/2014 Intraoperative images from Port-A-Cath placement as described.   Dg Chest Port 1 View 01/06/2014 No evidence of acute abnormality. Cardiomegaly with right-sided aortic arch stent graft. Mild right basilar atelectasis/scarring again noted.   Dg Abd 2 Views 01/08/2014 1. Stent placed in the distal sigmoid colon, as above. No evidence of bowel obstruction. 2. No pneumoperitoneum. 3. Cholelithiasis. 4. Hepatomegaly. 5. Additional incidental findings, as above.   Medical Consultants:    Dr. Burney Gauze, Oncology  Dr.  Delfin Edis, GI.  Dr. Jim Like, Neurology  Dr. Greer Pickerel, Surgery  Anti-Infectives:    None.    SignedLeisa Lenz, MD  [facility] 01/14/2014, 11:02 AM  Pager #: [phone]   Discharge  Exam: Filed Vitals:   01/14/14 1028  BP: 160/91  Pulse: 70  Temp: 97.9 F (36.6 C)  Resp: 20   Filed Vitals:   01/13/14 2044 01/13/14 2148 01/14/14 0531 01/14/14 1028  BP: 138/95 124/97 151/106 160/91  Pulse:   70 70  Temp:   97.5 F (36.4 C) 97.9 F (36.6 C)  TempSrc:   Oral Oral  Resp:   26 20  Height:      Weight:      SpO2:   99% 100%    General: Pt is alert, follows commands appropriately, not in acute distress Cardiovascular: Regular rate and rhythm, S1/S2 +, no murmurs Respiratory: Clear to auscultation bilaterally, no wheezing, no crackles, no rhonchi Abdominal: Soft, non tender, bowel sounds +, no guarding Extremities: no edema, no cyanosis, pulses palpable bilaterally DP and PT   Discharge Instructions  Discharge Instructions    Call MD for:  difficulty breathing, headache or visual disturbances    Complete by:  As directed      Call MD for:  persistant dizziness or light-headedness    Complete by:  As directed      Call MD for:  persistant nausea and vomiting    Complete by:  As directed      Call MD for:  severe uncontrolled pain    Complete by:  As directed      Diet - low sodium heart healthy    Complete by:  As directed      Discharge instructions    Complete by:  As directed   1. For blood pressure please take Norvasc 5 mg daily, hydralazine 25 mg 3 x daily, imdur 40 mg 2 x daily and metoprolol 50 mg 2 x daily as prescribed. 2. Please follow-up with cancer Center per scheduled appointment     Increase activity slowly    Complete by:  As directed      TREATMENT CONDITIONS    Complete by:  As directed   Patient should have CBC & CMP within 7 days prior to chemotherapy administration. NOTIFY MD IF: ANC < 1500, Hemoglobin < 8, PLT < 100,000,  Total Bili > 1.5, Creatinine > 1.5, ALT & AST > 80 or if patient has unstable vital signs: Temperature > 38.5, SBP > 180 or < 90, RR > 30 or HR > 100.            Medication List    STOP taking these  medications        simvastatin 20 MG tablet  Commonly known as:  ZOCOR     tamsulosin 0.4 MG Caps capsule  Commonly known as:  FLOMAX      TAKE these medications        amLODipine 5 MG tablet  Commonly known as:  NORVASC  Take 1 tablet (5 mg total) by mouth daily.     aspirin 325 MG EC tablet  Take 1 tablet (325 mg total) by mouth daily.     bismuth subsalicylate 893 TD/42AJ suspension  Commonly known as:  PEPTO BISMOL  Take 30 mLs by mouth 4 (four) times daily -  before meals and at bedtime.     diphenoxylate-atropine 2.5-0.025 MG per tablet  Commonly known as:  LOMOTIL  Take 2 tablets by mouth 4 (four) times daily.     dronabinol 5 MG capsule  Commonly known as:  MARINOL  Take 1 capsule (5 mg total) by mouth 3 (three) times daily before meals.     feeding supplement (RESOURCE BREEZE) Liqd  Take 1 Container by mouth 3 (three) times daily between meals.     folic acid 1 MG tablet  Commonly known as:  FOLVITE  Take 1 tablet (1 mg total) by mouth daily.     hydrALAZINE 25 MG tablet  Commonly known as:  APRESOLINE  Take 1 tablet (25 mg total) by mouth every 8 (eight) hours.     isosorbide dinitrate 40 MG tablet  Commonly known as:  ISORDIL  Take 1 tablet (40 mg total) by mouth 2 (two) times daily.     metoprolol succinate 50 MG 24 hr tablet  Commonly known as:  TOPROL-XL  Take 1 tablet (50 mg total) by mouth daily. Take with or immediately following a meal.     oxyCODONE 5 MG immediate release tablet  Commonly known as:  Oxy IR/ROXICODONE  Take 1 tablet (5 mg total) by mouth every 4 (four) hours as needed for severe pain.     potassium chloride SA 20 MEQ tablet  Commonly known as:  K-DUR,KLOR-CON  Take 1 tablet (20 mEq total) by mouth 2 (two) times daily.     pravastatin 20 MG tablet  Commonly known as:  PRAVACHOL  Take 1 tablet (20 mg total) by mouth daily at 6 PM.     saccharomyces boulardii 250 MG capsule  Commonly known as:  FLORASTOR  Take 1 capsule  (250 mg total) by mouth 2 (two) times daily.     traZODone 50 MG tablet  Commonly known as:  DESYREL  Take 1 tablet (50 mg total) by mouth at bedtime as needed for sleep.           Follow-up Information    Follow up with [facility]. Schedule an appointment as soon as possible for a visit in 1 week.   Why:  Follow up appt after recent hospitalization   Contact information:   [address] [place] [address] [phone]        The results of significant diagnostics from this hospitalization (including imaging, microbiology, ancillary and laboratory) are listed below for reference.    Significant Diagnostic Studies: Ct Angio Head W/cm &/or Wo Cm  12/28/2013   CLINICAL DATA:  Dizziness with abnormal gait, symptoms for 1 hr mildly hypertensive.  EXAM: CT ANGIOGRAPHY HEAD AND NECK  TECHNIQUE: Multi detector CT axial noncontrast CT images of the head. Multidetector CT imaging of the head and neck was performed using the standard protocol during bolus administration of intravenous contrast. Multiplanar CT image reconstructions and MIPs were obtained to evaluate the vascular anatomy. Carotid stenosis measurements (when applicable) are obtained utilizing NASCET criteria, using the distal internal carotid diameter as the denominator.  CONTRAST:  51mL OMNIPAQUE IOHEXOL 350 MG/ML SOLN  COMPARISON:  CT of the chest July 20, 2010  FINDINGS: CT HEAD FINDINGS  No intraparenchymal hemorrhage, mass effect, midline shift. Remote LEFT basal ganglia infarct with ex vacuo dilatation of frontal horn of LEFT lateral ventricle. Subcentimeter RIGHT basal ganglia chronic appearing lacunar infarcts. Moderate generalized ventriculomegaly, likely on the basis of global parenchymal brain volume loss as there is overall commensurate enlargement of cerebral sulci and cerebellar folia. RIGHT mesial occipital lobe transcortical encephalomalacia, LEFT parietal transcortical encephalomalacia.  Tiny  LEFT and possibly RIGHT cerebellar infarct. Confluent supratentorial white matter hypodensities. No abnormal intraparenchymal or extra-axial enhancement.  No abnormal extra-axial fluid collections. Moderate calcific atherosclerosis of the carotid siphons. Subcentimeter calcifications within the insular branches of bilateral middle cerebral arteries and, along the A 2/3 bilateral anterior cerebral arteries. Basal cisterns are patent.  No skull fracture. The visualized paranasal sinuses and mastoid air cells are well aerated.  CTA HEAD FINDINGS  Anterior circulation: Widely patent cervical, features, cavernous and supra clinoid internal carotid arteries with moderate calcific atherosclerosis. Mildly ectatic appearance of the carotid internal carotid arteries and supra clinoid internal carotid arteries. Robust RIGHT and diminutive LEFT A1 segments with patent bilateral anterior and middle cerebral arteries. Calcified insular middle cerebral artery branches and anterior cerebral artery A2/3 segments. Irregular appearance of the RIGHT A 2/3 segments.  Posterior circulation: RIGHT vertebral artery is dominant with thready irregular LEFT V4 segment. Tortuous basilar artery, main branch vessels appear patent. Robust LEFT posterior communicating artery with compensatory diminutive LEFT P1 segment. Normal appearance of bilateral posterior cerebral arteries.  Ectatic intracranial vessels without aneurysm , large vessel occlusion, hemodynamically significant stenosis.  Poor dentition with multiple dental caries and periapical lucencies.  Review of the MIP images confirms the above findings.  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 Origin of the RIGHT Common carotid artery is not imaged. Retrograde apparent filling of the RIGHT subclavian artery, origin of LEFT subclavian artery not  included.  Are all appearance of the bilateral Common carotid arteries which course in a straight line fashion appear widely patent. 1-2 mm eccentric calcific atherosclerosis of the RIGHT carotid bulb without hemodynamically significant stenosis of the internal carotid artery origins by NASCET criteria. Normal appearance of cervical internal carotid artery.  Origin the RIGHT vertebral artery is widely patent. Origin of the LEFT vertebral artery is not well identified and possibly occluded. However, there is mild contrast opacification that intra foraminal LEFT vertebral artery likely via muscular branches. Widely patent RIGHT vertebral artery which is dominant. Thready left V3 and V4 segments.  Included view of the chest demonstrates median sternotomy. 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 Straightened cervical lordosis.  Review of the MIP images confirms the above findings.  IMPRESSION: CT HEAD:  No definite acute intracranial process.  RIGHT posterior cerebral artery, LEFT middle cerebral artery territory of remote infarcts including LEFT basal ganglia. Additional small basal ganglia and cerebellar infarcts.  Moderate white matter changes suggest chronic small vessel ischemic disease. Moderate global brain atrophy, advanced for age.  CTA HEAD:  No acute vascular process.  Thready irregular LEFT intradural vertebral artery likely reflecting intracranial atherosclerosis with similar findings of the RIGHT A2/3 segment.  Dolicoectatic intracranial vessels likely reflect chronic hypertension.  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 No contrast extravasation within the visualized portion of the thoracic aorta.  Chronically occluded origin LEFT vertebral artery, with patent foraminal segment likely via muscular branches.  No acute vascular process within the neck  vessels, no hemodynamically significant stenosis.  Acute findings discussed with and reconfirmed by Dr.CHRIS DAVIS on 12/27/2013 at 12:05 am.   Electronically Signed   By: Elon Alas   On: 12/28/2013 00:07   Dg Chest 2 View  12/28/2013   CLINICAL DATA:  General weakness.  Difficulty standing.  Dizziness.  EXAM: CHEST  2 VIEW  COMPARISON:  Chest 07/27/2010.  CT chest 07/20/2010.  FINDINGS: Postoperative changes with sternotomy wires in the mediastinum and vascular markers present. Ectatic right-sided aortic arch and descending aorta with stent graft in place. Cardiac enlargement. No pulmonary vascular congestion. Linear atelectasis in the right lung base. No blunting of costophrenic angles. No pneumothorax. No focal consolidation in the lungs. Curvilinear scarring versus bulla in the left mid lung is unchanged. Degenerative changes in the spine.  IMPRESSION: No evidence of active pulmonary disease. Linear atelectasis in the right lung base. Right-sided aortic arch post stent graft.   Electronically Signed   By: Lucienne Capers M.D.   On: 12/28/2013 00:15   Dg Abd 1 View  12/28/2013   CLINICAL DATA:  Abdominal distention  EXAM: ABDOMEN - 1 VIEW  COMPARISON:  None.  FINDINGS: Postoperative changes in the chest. Residual contrast material in the renal collecting system and bladder. Rounded calcifications in the right upper quadrant likely representing gallstones. Vascular calcifications. Degenerative changes in the spine and hips. Scattered gas and stool in the colon. No small or large bowel distention.  IMPRESSION: Nonobstructive bowel gas pattern. Residual contrast material in the renal collecting system and bladder. Cholelithiasis.   Electronically Signed   By: Lucienne Capers M.D.   On: 12/28/2013 00:17   Ct Angio Neck W/cm &/or Wo/cm  12/28/2013   CLINICAL DATA:  Dizziness with abnormal gait, symptoms for 1 hr mildly hypertensive.  EXAM: CT ANGIOGRAPHY HEAD AND NECK  TECHNIQUE: Multi detector  CT axial noncontrast CT images of the head. Multidetector CT imaging of the head and neck was performed using the standard protocol during bolus administration of intravenous contrast. Multiplanar CT image reconstructions and MIPs were obtained to evaluate the vascular anatomy. Carotid stenosis measurements (when applicable) are obtained utilizing NASCET criteria, using the distal internal carotid diameter as the denominator.  CONTRAST:  18mL OMNIPAQUE IOHEXOL 350 MG/ML SOLN  COMPARISON:  CT of the chest July 20, 2010  FINDINGS: CT HEAD FINDINGS  No intraparenchymal hemorrhage, mass effect, midline shift. Remote LEFT basal ganglia infarct with ex vacuo dilatation of frontal horn of LEFT lateral ventricle. Subcentimeter RIGHT basal ganglia chronic appearing lacunar infarcts. Moderate generalized ventriculomegaly, likely on the basis of global parenchymal brain volume loss as there is overall commensurate enlargement of cerebral sulci and cerebellar folia. RIGHT mesial occipital lobe transcortical encephalomalacia, LEFT parietal transcortical encephalomalacia. Tiny LEFT and possibly RIGHT cerebellar infarct. Confluent supratentorial white matter hypodensities. No abnormal intraparenchymal or extra-axial enhancement.  No abnormal extra-axial fluid collections. Moderate calcific atherosclerosis of the carotid siphons. Subcentimeter calcifications within the insular branches of bilateral middle cerebral arteries and, along the A 2/3 bilateral anterior cerebral arteries. Basal cisterns are patent.  No skull fracture. The visualized paranasal sinuses and mastoid air cells are well aerated.  CTA HEAD FINDINGS  Anterior circulation: Widely patent cervical, features, cavernous and supra clinoid internal carotid arteries with moderate calcific atherosclerosis. Mildly ectatic appearance of the carotid internal carotid arteries and supra clinoid internal carotid arteries. Robust RIGHT and diminutive LEFT A1 segments with patent  bilateral anterior and middle cerebral arteries. Calcified insular middle cerebral artery branches and anterior cerebral artery A2/3 segments. Irregular appearance of the RIGHT A 2/3 segments.  Posterior circulation: RIGHT vertebral artery is dominant with thready irregular LEFT V4 segment. Tortuous basilar artery, main branch vessels appear patent. Robust LEFT posterior communicating artery with compensatory diminutive LEFT P1 segment. Normal appearance of bilateral posterior cerebral arteries.  Ectatic intracranial  vessels without aneurysm , large vessel occlusion, hemodynamically significant stenosis.  Poor dentition with multiple dental caries and periapical lucencies.  Review of the MIP images confirms the above findings.  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 Origin of the RIGHT Common carotid artery is not imaged. Retrograde apparent filling of the RIGHT subclavian artery, origin of LEFT subclavian artery not included.  Are all appearance of the bilateral Common carotid arteries which course in a straight line fashion appear widely patent. 1-2 mm eccentric calcific atherosclerosis of the RIGHT carotid bulb without hemodynamically significant stenosis of the internal carotid artery origins by NASCET criteria. Normal appearance of cervical internal carotid artery.  Origin the RIGHT vertebral artery is widely patent. Origin of the LEFT vertebral artery is not well identified and possibly occluded. However, there is mild contrast opacification that intra foraminal LEFT vertebral artery likely via muscular branches. Widely patent RIGHT vertebral artery which is dominant. Thready left V3 and V4 segments.  Included view of the chest demonstrates median sternotomy. 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 Straightened cervical lordosis.  Review of the MIP images confirms the above  findings.  IMPRESSION: CT HEAD:  No definite acute intracranial process.  RIGHT posterior cerebral artery, LEFT middle cerebral artery territory of remote infarcts including LEFT basal ganglia. Additional small basal ganglia and cerebellar infarcts.  Moderate white matter changes suggest chronic small vessel ischemic disease. Moderate global brain atrophy, advanced for age.  CTA HEAD:  No acute vascular process.  Thready irregular LEFT intradural vertebral artery likely reflecting intracranial atherosclerosis with similar findings of the RIGHT A2/3 segment.  Dolicoectatic intracranial vessels likely reflect chronic hypertension.  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 No contrast extravasation within the visualized portion of the thoracic aorta.  Chronically occluded origin LEFT vertebral artery, with patent foraminal segment likely via muscular branches.  No acute vascular process within the neck vessels, no hemodynamically significant stenosis.  Acute findings discussed with and reconfirmed by Dr.CHRIS DAVIS on 12/27/2013 at 12:05 am.   Electronically Signed   By: Elon Alas   On: 12/28/2013 00:07   Ct Chest W Contrast  12/31/2013   CLINICAL DATA:  Colon cancer with liver metastasis.  EXAM: CT CHEST WITH CONTRAST  TECHNIQUE: Multidetector CT imaging of the chest was performed during intravenous contrast administration.  CONTRAST:  43mL OMNIPAQUE IOHEXOL 300 MG/ML  SOLN  COMPARISON:  Plain films of 12/27/2013. Abdominal pelvic CT of 12/28/2013. Chest CT of 07/20/2010.  FINDINGS: Lungs/Pleura: Calcified granuloma at the right lung base. Right lower lobe subpleural 5 mm nodule on image 31 of series 205. New.  Subpleural left upper lobe 5 mm nodule on image 21 is not identified on the prior exam.  Mild motion degradation.  A left lower lobe 3 mm  nodule on image 29 is not readily apparent on the prior exam.  No pleural fluid.  Heart/Mediastinum: No supraclavicular adenopathy. Right anterior chest wall collaterals likely relate to central venous insufficiency at the right brachiocephalic vein. 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 No acute complication identified.  Mild cardiomegaly with left ventricular wall thickening. Prior median sternotomy. Multivessel coronary artery atherosclerosis. No central pulmonary embolism, on this non-dedicated study. No mediastinal or hilar adenopathy.  Upper Abdomen: Innumerable liver masses, better evaluated on dedicated abdominal  study. Upper abdominal aortic atherosclerosis. Perisplenic and perihepatic ascites. A suspicion of cirrhosis.  Right greater than left gynecomastia.  Bones/Musculoskeletal:  No acute osseous abnormality.  IMPRESSION: 1. Nonspecific small pulmonary nodules. These are likely new since the remote exam of 07/20/2010. Early pulmonary metastasis cannot be excluded. 2. Status post right sided aortic stent graft repair, without acute complication. 3. Cardiomegaly and left ventricular hypertrophy. 4. Multiple liver masses with suggestion of cirrhosis. This could represent metastatic disease and/or hepatocellular carcinoma/carcinomas. 5. Upper abdominal ascites. 6. Gynecomastia.   Electronically Signed   By: Abigail Miyamoto M.D.   On: 12/31/2013 10:36   Mri Brain Without Contrast  12/28/2013   CLINICAL DATA:  Chronic dizziness for 2 months worsening in the past week. Two falls.  EXAM: MRI HEAD WITHOUT CONTRAST  MRA HEAD WITHOUT CONTRAST  TECHNIQUE: Multiplanar, multiecho pulse sequences of the brain and surrounding structures were obtained without intravenous contrast. Angiographic images of the head were obtained using MRA technique without contrast.  COMPARISON:  CTA head 12/27/2013  FINDINGS: MRI HEAD FINDINGS  There is a 6 mm focus of increased diffusion weighted signal  intensity in the posterior left corona radiata without definite restricted diffusion on the ADC map. There are also a few punctate foci of mildly increased diffusion-weighted signal in the high right frontal lobe involving white matter greater than cortex, also without restricted diffusion identified on the ADC map although with evaluation limited by their small size.  Scattered foci of chronic microhemorrhage are present throughout both cerebral hemispheres. Small, chronic infarcts are present in both cerebellar hemispheres. Chronic lacunar infarcts are present in the left greater than right basal ganglia and right frontal periventricular white matter. Small to moderate-sized chronic infarct is present in the left parietal lobe with associated chronic blood product, and there is a small, chronic right occipital cortical infarct. There is mild generalized cerebral atrophy. Patchy T2 hyperintensities in the subcortical and deep cerebral white matter elsewhere are nonspecific but compatible with mild to moderate chronic small vessel ischemic disease, advanced for age.  Orbits are unremarkable. Paranasal sinuses and mastoid air cells are clear. Distal left vertebral artery flow void is not well seen. Major intracranial vascular flow voids are otherwise preserved.  MRA HEAD FINDINGS  The visualized distal right vertebral artery is patent and dominant, supplying the basilar. Only minimal, intermittent flow related enhancement is present in the expected region of the intracranial left vertebral artery, which was shown to be very small in caliber although patent on the recent CTA. Basilar artery is patent and tortuous without stenosis. SCA origins are patent. Right P1 segment is patent without stenosis. There is a mild to moderate focal proximal right P2 stenosis. There is a prominent, patent left posterior communicating artery. No significant left PCA stenosis is seen.  Internal carotid arteries are patent from skullbase  to carotid termini without stenosis. Cavernous and supraclinoid segments are again noted to be mildly ectatic, right more so than left. MCAs are unremarkable aside from mild branch vessel irregularity. There are large right A1 and hypoplastic left A1 segments. There are moderate to severe tandem stenoses involving the A2/A3 segments bilaterally. No intracranial aneurysm is identified.  IMPRESSION: 1. No significant sized acute or subacute infarct. Subcentimeter foci of abnormal diffusion-weighted signal in the left corona radiata and high right frontal lobe may reflect subacute embolic/small vessel infarcts. 2. 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 3. No significant flow related enhancement identified in the visualized distal left vertebral  artery. This session to be very small but patent on the recent CTA. 4. Mild-to-moderate proximal right P2 stenosis. 5. Ectatic distal internal carotid arteries. 6. Moderate to severe bilateral A2/A3 stenoses.   Electronically Signed   By: Logan Bores   On: 12/28/2013 13:32   Ct Abdomen Pelvis W Contrast  12/28/2013   CLINICAL DATA:  Abdominal tenderness. Abdominal distention. Abnormal liver function tests. History of hypertension and CABG surgery. Abdominal mass.  EXAM: CT ABDOMEN AND PELVIS WITH CONTRAST  TECHNIQUE: Multidetector CT imaging of the abdomen and pelvis was performed using the standard protocol following bolus administration of intravenous contrast.  CONTRAST:  144mL OMNIPAQUE IOHEXOL 300 MG/ML  SOLN  COMPARISON:  None.  FINDINGS: Heart is moderately enlarged. There is a right aortic arch. A stent extends through the thoracic aorta. Calcified granuloma is noted in the right lower lobe. No other lung base nodules.  Liver is enlarged containing a numerous heterogeneous lesions. A conglomeration of lesions in the right lobe measures approximate 14 cm x 8.1 cm transversely. A discrete mass in the left lobe, crossing the ligamentum  flavum, measures 5.6 cm x 5.3 cm transversely. Conglomeration of lesions in the lateral segment of the left lobe measures 8.2 cm x 5.2 cm. There are numerous additional lesions. Lateral segment of the left lobe of the liver is larger the right lobe. Liver measures 27 cm in greatest transverse dimension.  There is a 7 cm long length of thick-walled sigmoid colon with associated small mesenteric lymph nodes. Given the findings in the liver, this is highly suspicious for a colonic malignancy.  No other colonic masses. No obstruction. No bowel inflammatory change. Normal appendix is seen.  Spleen is normal in size. Gallbladder contains multiple gallstones. There is no evidence acute cholecystitis. Is no bile duct dilation. Pancreas is unremarkable. No adrenal masses. Bilateral renal cortical thinning. There are several small low-density renal lesions most likely cysts. Kidneys otherwise unremarkable. No hydronephrosis. Ureters normal course and caliber. Bladder is unremarkable.  There is several prominent left retroperitoneal lymph nodes and aortocaval nodes. None are larger than 1 cm short axis. These may be neoplastic or reactive.  Small amount of ascites is noted collecting adjacent to the liver and in the posterior pelvic recess.  No osteoblastic or osteolytic lesions. Degenerative changes of the hips, right greater than left. Spine degenerative changes most evident at L3-L4.  IMPRESSION: 1. No acute finding. 2. Liver is enlarged with numerous ill-defined hypo attenuating and mixed attenuation lesions. There is a 7 cm length of sigmoid colon that is diffusely thickened, consistent with a primary colon malignancy. Metastatic disease is liver is suspected. Liver abnormalities could potentially reflect multifocal hepatic cellular carcinoma. 3. Small amount of ascites. No other evidence of metastatic disease. Nodes.   Electronically Signed   By: Lajean Manes M.D.   On: 12/28/2013 13:45   Dg Chest Port 1  View  01/06/2014   CLINICAL DATA:  58 year old male with fever. History of metastatic colon cancer.  EXAM: PORTABLE CHEST - 1 VIEW  COMPARISON:  01/02/2014 and prior chest radiographs dating back to 07/20/2010. 12/31/2013 and prior CTs  FINDINGS: Cardiomegaly, right-sided aortic arch with stent graft, and left IJ Port-A-Cath with tip overlying the superior cavoatrial junction again noted.  Mild right basilar atelectasis/scarring again noted.  There is no evidence of focal airspace disease, pulmonary edema, suspicious pulmonary nodule/mass, pleural effusion, or pneumothorax. No acute bony abnormalities are identified.  IMPRESSION: No evidence of acute abnormality.  Cardiomegaly with  right-sided aortic arch stent graft.  Mild right basilar atelectasis/scarring again noted.   Electronically Signed   By: Hassan Rowan M.D.   On: 01/06/2014 21:35   Dg Chest Port 1 View  01/02/2014   CLINICAL DATA:  Current history of colon cancer. Port-A-Cath placement.  EXAM: PORTABLE CHEST - 1 VIEW  COMPARISON:  December 27, 2013.  FINDINGS: Stable cardiomegaly. Sternotomy wires are noted. Stent graft is again noted in right sided aortic arch. Interval placement of left internal jugular Port-A-Cath with distal tip overlying expected position of cavoatrial junction. No pneumothorax or significant pleural effusion is noted. Minimal linear density is noted in lingula suggesting subsegmental atelectasis.  IMPRESSION: No pneumothorax status post left internal jugular Port-A-Cath placement. Distal tip is seen overlying expected position of cavoatrial junction.   Electronically Signed   By: Sabino Dick M.D.   On: 01/02/2014 15:12   Dg Abd 2 Views  01/08/2014   CLINICAL DATA:  58 year old male with history of bloating and diarrhea. No nodular vomiting. Recent stent placement in the sigmoid colon for obstructing colonic mass.  EXAM: ABDOMEN - 2 VIEW  COMPARISON:  12/27/2013.  FINDINGS: A stent is noted in the region of the distal sigmoid  colon. Gas and stool are noted throughout the colon and distal rectum. No pathologic dilatation of small bowel. The hepatic silhouette is markedly enlarged. Multiple rim calcified gallstones are seen projecting over the right lower quadrant (indeed location of the gallbladder which is displaced downward from the massive hepatomegaly).  Visualized portions of the lower thorax are remarkable for postoperative changes of median sternotomy for CABG and stent graft repair in the right-sided aorta. 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  IMPRESSION: 1. Stent placed in the distal sigmoid colon, as above. No evidence of bowel obstruction. 2. No pneumoperitoneum. 3. Cholelithiasis. 4. Hepatomegaly. 5. Additional incidental findings, as above.   Electronically Signed   By: Vinnie Langton M.D.   On: 01/08/2014 09:15   Dg Abd 2 Views  01/02/2014   CLINICAL DATA:  Colonic obstruction, "walking corpse" syndrome.  EXAM: ABDOMEN - 2 VIEW  COMPARISON:  Abdominal series of December 27, 2013  FINDINGS: There is small amount of retained contrast within the colon. The small and large bowel pattern does not suggest obstruction. There are multiple rim calcified faceted gallstones present lateral to the L3-L4 disc space. There is a descending thoracic aortic stent graft. There is a stent graft lying in the pelvis which consistent with the sigmoid colonic stent graft. It is only partially expanded but this may be intentional.  IMPRESSION: 1. There is no bowel obstruction demonstrated. A partially collapsed sigmoid colonic stent graft is present within the true bony pelvis. 2. Gallstones   Electronically Signed   By: David  Martinique   On: 01/02/2014 09:33   Dg Abd 2 Views  01/01/2014   CLINICAL DATA:  Colonic mass.  EXAM: DG C-ARM 61-120 MIN; ABDOMEN - 2 VIEW  COMPARISON:  CT scan of December 28, 2013.  FINDINGS: Ten fluoroscopic images were obtained during stent placement through colonic mass via  colonoscopy.  IMPRESSION: Stent placement for treatment of colonic mass.   Electronically Signed   By: Sabino Dick M.D.   On: 01/01/2014 11:19   Dg Fluoro Guide Cv Line Left  01/04/2014   CLINICAL DATA:  Port-A-Cath  EXAM: FLOURO GUIDE CV LINE  COMPARISON:  None.  FINDINGS: I am interpreting 2 spot images from a surgical procedure. I was not  present during the procedure.  A Port-A-Cath projects over the left chest and neck region. It extends across the midline. The Port-A-Cath tip is obscured by a stent graft. Contrast material was not injected. The exact location of the Port-A-Cath cannot be confirmed. Right-sided descending aorta is noted.  IMPRESSION: Intraoperative images from Port-A-Cath placement as described.   Electronically Signed   By: Maryclare Bean M.D.   On: 01/04/2014 07:58   Dg C-arm 61-120 Min  01/01/2014   CLINICAL DATA:  Colonic mass.  EXAM: DG C-ARM 61-120 MIN; ABDOMEN - 2 VIEW  COMPARISON:  CT scan of December 28, 2013.  FINDINGS: Ten fluoroscopic images were obtained during stent placement through colonic mass via colonoscopy.  IMPRESSION: Stent placement for treatment of colonic mass.   Electronically Signed   By: Sabino Dick M.D.   On: 01/01/2014 11:19   Mr Jodene Nam Head/brain Wo Cm  12/28/2013   CLINICAL DATA:  Chronic dizziness for 2 months worsening in the past week. Two falls.  EXAM: MRI HEAD WITHOUT CONTRAST  MRA HEAD WITHOUT CONTRAST  TECHNIQUE: Multiplanar, multiecho pulse sequences of the brain and surrounding structures were obtained without intravenous contrast. Angiographic images of the head were obtained using MRA technique without contrast.  COMPARISON:  CTA head 12/27/2013  FINDINGS: MRI HEAD FINDINGS  There is a 6 mm focus of increased diffusion weighted signal intensity in the posterior left corona radiata without definite restricted diffusion on the ADC map. There are also a few punctate foci of mildly increased diffusion-weighted signal in the high right frontal lobe  involving white matter greater than cortex, also without restricted diffusion identified on the ADC map although with evaluation limited by their small size.  Scattered foci of chronic microhemorrhage are present throughout both cerebral hemispheres. Small, chronic infarcts are present in both cerebellar hemispheres. Chronic lacunar infarcts are present in the left greater than right basal ganglia and right frontal periventricular white matter. Small to moderate-sized chronic infarct is present in the left parietal lobe with associated chronic blood product, and there is a small, chronic right occipital cortical infarct. There is mild generalized cerebral atrophy. Patchy T2 hyperintensities in the subcortical and deep cerebral white matter elsewhere are nonspecific but compatible with mild to moderate chronic small vessel ischemic disease, advanced for age.  Orbits are unremarkable. Paranasal sinuses and mastoid air cells are clear. Distal left vertebral artery flow void is not well seen. Major intracranial vascular flow voids are otherwise preserved.  MRA HEAD FINDINGS  The visualized distal right vertebral artery is patent and dominant, supplying the basilar. Only minimal, intermittent flow related enhancement is present in the expected region of the intracranial left vertebral artery, which was shown to be very small in caliber although patent on the recent CTA. Basilar artery is patent and tortuous without stenosis. SCA origins are patent. Right P1 segment is patent without stenosis. There is a mild to moderate focal proximal right P2 stenosis. There is a prominent, patent left posterior communicating artery. No significant left PCA stenosis is seen.  Internal carotid arteries are patent from skullbase to carotid termini without stenosis. Cavernous and supraclinoid segments are again noted to be mildly ectatic, right more so than left. MCAs are unremarkable aside from mild branch vessel irregularity. There are  large right A1 and hypoplastic left A1 segments. There are moderate to severe tandem stenoses involving the A2/A3 segments bilaterally. No intracranial aneurysm is identified.  IMPRESSION: 1. No significant sized acute or subacute infarct. Subcentimeter foci of  abnormal diffusion-weighted signal in the left corona radiata and high right frontal lobe may reflect subacute embolic/small vessel infarcts. 2. 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 3. No significant flow related enhancement identified in the visualized distal left vertebral artery. This session to be very small but patent on the recent CTA. 4. Mild-to-moderate proximal right P2 stenosis. 5. Ectatic distal internal carotid arteries. 6. Moderate to severe bilateral A2/A3 stenoses.   Electronically Signed   By: Logan Bores   On: 12/28/2013 13:32    Microbiology: Recent Results (from the past 240 hour(s))  Culture, blood (routine x 2)     Status: None   Collection Time: 01/06/14  9:45 PM  Result Value Ref Range Status   Specimen Description BLOOD R ARM  Final   Special Requests BOTTLES DRAWN AEROBIC AND ANAEROBIC 10 CC EACH  Final   Culture  Setup Time   Final    01/07/2014 01:17 Performed at [facility]    Culture   Final    NO GROWTH 5 DAYS Performed at [facility]    Report Status 01/13/2014 FINAL  Final  Culture, blood (routine x 2)     Status: None   Collection Time: 01/06/14  9:50 PM  Result Value Ref Range Status   Specimen Description BLOOD L ARM  Final   Special Requests BOTTLES DRAWN AEROBIC AND ANAEROBIC 10CC EACH  Final   Culture  Setup Time   Final    01/07/2014 01:17 Performed at [facility]    Culture   Final    NO GROWTH 5 DAYS Performed at [facility]    Report Status 01/13/2014 FINAL  Final  Culture, Urine     Status: None   Collection Time: 01/06/14 10:50 PM  Result Value Ref Range Status   Specimen Description URINE, CLEAN CATCH  Final    Special Requests NONE  Final   Culture  Setup Time   Final    01/07/2014 08:59 Performed at [place] Performed at [facility]   Final   Culture NO GROWTH Performed at [facility]   Final   Report Status 01/08/2014 FINAL  Final  Clostridium Difficile by PCR     Status: None   Collection Time: 01/09/14  8:53 AM  Result Value Ref Range Status   C difficile by pcr NEGATIVE NEGATIVE Final    Comment: Performed at [place]: Basic Metabolic Panel:  Recent Labs Lab 01/09/14 0530 01/10/14 0545 01/11/14 0550 01/13/14 0515 01/14/14 0520  NA 135* 138 134* 134* 133*  K 3.9 4.0 4.0 3.5* 3.4*  CL 94* 95* 93* 93* 93*  CO2 27 28 29 27 26   GLUCOSE 108* 97 98 108* 123*  BUN 28* 29* 27* 24* 21  CREATININE 1.13 1.18 1.11 1.06 1.08  CALCIUM 8.7 8.8 9.1 9.0 9.0   Liver Function Tests:  Recent Labs Lab 01/08/14 0500 01/09/14 0530 01/11/14 0550 01/13/14 0515 01/14/14 0520  AST 97* 86* 79* 69* 72*  ALT 28 23 19 16 16   ALKPHOS 307* 289* 306* 281* 305*  BILITOT 0.6 0.5 0.4 0.3 0.4  PROT 7.2 6.9 7.0 6.7 7.0  ALBUMIN 2.3* 2.2* 2.2* 2.1* 2.2*   No results for input(s): LIPASE, AMYLASE in the last 168 hours. No results for input(s): AMMONIA in the last 168 hours. CBC:  Recent Labs Lab 01/10/14 0545 01/11/14 0550  01/12/14 0530 01/13/14 0515 01/14/14 0520  WBC 13.2* 16.0* 13.6* 13.9* 15.6*  HGB 10.5* 10.4* 9.8* 10.0* 10.5*  HCT 31.1* 31.1* 30.4* 31.0* 32.7*  MCV 80.8 81.6 83.5 84.7 85.8  PLT 297 300 294 299 320   Cardiac Enzymes: No results for input(s): CKTOTAL, CKMB, CKMBINDEX, TROPONINI in the last 168 hours. BNP: BNP (last 3 results) No results for input(s): PROBNP in the last 8760 hours. CBG:  Recent Labs Lab 01/07/14 1240 01/07/14 2124 01/08/14 0734 01/08/14 1235  GLUCAP 97 111* 103* 101*    Time coordinating discharge: Over 30 minutes

## 2014-01-14 NOTE — Progress Notes (Signed)
CSW received notification from RN that pt did not have transportation home and needing cab voucher.  Pt confirmed that he does not have anyone to transport him home. RN stated that she walked pt and does not feel that pt could tolerate bus, but could tolerate cab home.  CSW spoke with Nathaniel Man, [facility] department assistant director and had cab voucher approved.  Pt address confirmed by pt and RN provided cab voucher to arranged cab for pick up for pt.  No further social work needs identified at this time.  CSW signing off.   Alison Murray, MSW, [facility] [phone]

## 2014-01-16 ENCOUNTER — Other Ambulatory Visit: Payer: Self-pay | Admitting: Hematology & Oncology

## 2014-01-16 ENCOUNTER — Telehealth: Payer: Self-pay | Admitting: Hematology & Oncology

## 2014-01-16 DIAGNOSIS — C189 Malignant neoplasm of colon, unspecified: Secondary | ICD-10-CM

## 2014-01-16 DIAGNOSIS — C787 Secondary malignant neoplasm of liver and intrahepatic bile duct: Secondary | ICD-10-CM

## 2014-01-16 NOTE — Telephone Encounter (Signed)
Left pt message on both lines to call for 12-15 appointments

## 2014-01-17 ENCOUNTER — Telehealth: Payer: Self-pay | Admitting: Hematology & Oncology

## 2014-01-17 NOTE — Telephone Encounter (Signed)
Pt aware of 12-15 appointment

## 2014-01-22 ENCOUNTER — Telehealth: Payer: Self-pay | Admitting: Hematology & Oncology

## 2014-01-22 ENCOUNTER — Encounter: Payer: Self-pay | Admitting: Hematology & Oncology

## 2014-01-22 ENCOUNTER — Encounter: Payer: Self-pay | Admitting: Family

## 2014-01-22 ENCOUNTER — Other Ambulatory Visit: Payer: Self-pay | Admitting: Nurse Practitioner

## 2014-01-22 ENCOUNTER — Ambulatory Visit (HOSPITAL_BASED_OUTPATIENT_CLINIC_OR_DEPARTMENT_OTHER): Payer: No Typology Code available for payment source | Admitting: Family

## 2014-01-22 ENCOUNTER — Other Ambulatory Visit (HOSPITAL_BASED_OUTPATIENT_CLINIC_OR_DEPARTMENT_OTHER): Payer: No Typology Code available for payment source | Admitting: Lab

## 2014-01-22 ENCOUNTER — Ambulatory Visit (HOSPITAL_BASED_OUTPATIENT_CLINIC_OR_DEPARTMENT_OTHER): Payer: No Typology Code available for payment source

## 2014-01-22 DIAGNOSIS — C787 Secondary malignant neoplasm of liver and intrahepatic bile duct: Secondary | ICD-10-CM

## 2014-01-22 DIAGNOSIS — C189 Malignant neoplasm of colon, unspecified: Secondary | ICD-10-CM

## 2014-01-22 DIAGNOSIS — Z5111 Encounter for antineoplastic chemotherapy: Secondary | ICD-10-CM

## 2014-01-22 LAB — CMP (CANCER CENTER ONLY)
ALT(SGPT): 17 U/L (ref 10–47)
AST: 64 U/L — ABNORMAL HIGH (ref 11–38)
Albumin: 2.4 g/dL — ABNORMAL LOW (ref 3.3–5.5)
Alkaline Phosphatase: 261 U/L — ABNORMAL HIGH (ref 26–84)
BILIRUBIN TOTAL: 0.5 mg/dL (ref 0.20–1.60)
BUN: 14 mg/dL (ref 7–22)
CHLORIDE: 96 meq/L — AB (ref 98–108)
CO2: 25 mEq/L (ref 18–33)
CREATININE: 1.3 mg/dL — AB (ref 0.6–1.2)
Calcium: 8.8 mg/dL (ref 8.0–10.3)
GLUCOSE: 144 mg/dL — AB (ref 73–118)
Potassium: 3.6 mEq/L (ref 3.3–4.7)
Sodium: 137 mEq/L (ref 128–145)
Total Protein: 7.5 g/dL (ref 6.4–8.1)

## 2014-01-22 LAB — CBC WITH DIFFERENTIAL (CANCER CENTER ONLY)
BASO#: 0 10*3/uL (ref 0.0–0.2)
BASO%: 0.4 % (ref 0.0–2.0)
EOS%: 2.6 % (ref 0.0–7.0)
Eosinophils Absolute: 0.3 10*3/uL (ref 0.0–0.5)
HCT: 34.6 % — ABNORMAL LOW (ref 38.7–49.9)
HGB: 11.5 g/dL — ABNORMAL LOW (ref 13.0–17.1)
LYMPH#: 1.7 10*3/uL (ref 0.9–3.3)
LYMPH%: 16.2 % (ref 14.0–48.0)
MCH: 28.5 pg (ref 28.0–33.4)
MCHC: 33.2 g/dL (ref 32.0–35.9)
MCV: 86 fL (ref 82–98)
MONO#: 2.3 10*3/uL — ABNORMAL HIGH (ref 0.1–0.9)
MONO%: 22.3 % — AB (ref 0.0–13.0)
NEUT#: 6.1 10*3/uL (ref 1.5–6.5)
NEUT%: 58.5 % (ref 40.0–80.0)
PLATELETS: 350 10*3/uL (ref 145–400)
RBC: 4.04 10*6/uL — ABNORMAL LOW (ref 4.20–5.70)
RDW: 19.8 % — AB (ref 11.1–15.7)
WBC: 10.4 10*3/uL — ABNORMAL HIGH (ref 4.0–10.0)

## 2014-01-22 LAB — LACTATE DEHYDROGENASE: LDH: 585 U/L — AB (ref 94–250)

## 2014-01-22 LAB — CEA: CEA: 850.3 ng/mL — AB (ref 0.0–5.0)

## 2014-01-22 MED ORDER — LEUCOVORIN CALCIUM INJECTION 350 MG
400.0000 mg/m2 | Freq: Once | INTRAVENOUS | Status: AC
  Administered 2014-01-22: 748 mg via INTRAVENOUS
  Filled 2014-01-22: qty 37.4

## 2014-01-22 MED ORDER — FLUOROURACIL CHEMO INJECTION 5 GM/100ML
1920.0000 mg/m2 | INTRAVENOUS | Status: DC
  Administered 2014-01-22: 3600 mg via INTRAVENOUS
  Filled 2014-01-22: qty 72

## 2014-01-22 MED ORDER — FLUOROURACIL CHEMO INJECTION 2.5 GM/50ML
400.0000 mg/m2 | Freq: Once | INTRAVENOUS | Status: AC
  Administered 2014-01-22: 750 mg via INTRAVENOUS
  Filled 2014-01-22: qty 15

## 2014-01-22 MED ORDER — OXALIPLATIN CHEMO INJECTION 100 MG/20ML
85.0000 mg/m2 | Freq: Once | INTRAVENOUS | Status: AC
  Administered 2014-01-22: 160 mg via INTRAVENOUS
  Filled 2014-01-22: qty 32

## 2014-01-22 MED ORDER — DEXAMETHASONE SODIUM PHOSPHATE 10 MG/ML IJ SOLN
10.0000 mg | Freq: Once | INTRAMUSCULAR | Status: AC
  Administered 2014-01-22: 10 mg via INTRAVENOUS

## 2014-01-22 MED ORDER — ONDANSETRON 8 MG/50ML IVPB (CHCC)
8.0000 mg | Freq: Once | INTRAVENOUS | Status: AC
  Administered 2014-01-22: 8 mg via INTRAVENOUS

## 2014-01-22 MED ORDER — LIDOCAINE-PRILOCAINE 2.5-2.5 % EX CREA: 1.0000 "application " | TOPICAL_CREAM | CUTANEOUS | Status: DC | PRN

## 2014-01-22 MED ORDER — DEXTROSE 5 % IV SOLN
Freq: Once | INTRAVENOUS | Status: AC
  Administered 2014-01-22: 10:00:00 via INTRAVENOUS

## 2014-01-22 MED ORDER — DEXAMETHASONE SODIUM PHOSPHATE 10 MG/ML IJ SOLN
INTRAMUSCULAR | Status: AC
  Filled 2014-01-22: qty 1

## 2014-01-22 NOTE — Patient Instructions (Signed)
Leucovorin injection  What is this medicine?  LEUCOVORIN (loo koe VOR in) is used to prevent or treat the harmful effects of some medicines. This medicine is used to treat anemia caused by a low amount of folic acid in the body. It is also used with 5-fluorouracil (5-FU) to treat colon cancer.  This medicine may be used for other purposes; ask your health care provider or pharmacist if you have questions.  What should I tell my health care provider before I take this medicine?  They need to know if you have any of these conditions:  -anemia from low levels of vitamin B-12 in the blood  -an unusual or allergic reaction to leucovorin, folic acid, other medicines, foods, dyes, or preservatives  -pregnant or trying to get pregnant  -breast-feeding  How should I use this medicine?  This medicine is for injection into a muscle or into a vein. It is given by a health care professional in a hospital or clinic setting.  Talk to your pediatrician regarding the use of this medicine in children. Special care may be needed.  Overdosage: If you think you have taken too much of this medicine contact a poison control center or emergency room at once.  NOTE: This medicine is only for you. Do not share this medicine with others.  What if I miss a dose?  This does not apply.  What may interact with this medicine?  -capecitabine  -fluorouracil  -phenobarbital  -phenytoin  -primidone  -trimethoprim-sulfamethoxazole  This list may not describe all possible interactions. Give your health care provider a list of all the medicines, herbs, non-prescription drugs, or dietary supplements you use. Also tell them if you smoke, drink alcohol, or use illegal drugs. Some items may interact with your medicine.  What should I watch for while using this medicine?  Your condition will be monitored carefully while you are receiving this medicine.  This medicine may increase the side effects of 5-fluorouracil, 5-FU. Tell your doctor or health care  professional if you have diarrhea or mouth sores that do not get better or that get worse.  What side effects may I notice from receiving this medicine?  Side effects that you should report to your doctor or health care professional as soon as possible:  -allergic reactions like skin rash, itching or hives, swelling of the face, lips, or tongue  -breathing problems  -fever, infection  -mouth sores  -unusual bleeding or bruising  -unusually weak or tired  Side effects that usually do not require medical attention (report to your doctor or health care professional if they continue or are bothersome):  -constipation or diarrhea  -loss of appetite  -nausea, vomiting  This list may not describe all possible side effects. Call your doctor for medical advice about side effects. You may report side effects to FDA at 1-800-FDA-1088.  Where should I keep my medicine?  This drug is given in a hospital or clinic and will not be stored at home.  NOTE: This sheet is a summary. It may not cover all possible information. If you have questions about this medicine, talk to your doctor, pharmacist, or health care provider.   2015, Elsevier/Gold Standard. (2007-08-01 16:50:29)    Oxaliplatin Injection  What is this medicine?  OXALIPLATIN (ox AL i PLA tin) is a chemotherapy drug. It targets fast dividing cells, like cancer cells, and causes these cells to die. This medicine is used to treat cancers of the colon and rectum, and many other   cancers.  This medicine may be used for other purposes; ask your health care provider or pharmacist if you have questions.  COMMON BRAND NAME(S): Eloxatin  What should I tell my health care provider before I take this medicine?  They need to know if you have any of these conditions:  -kidney disease  -an unusual or allergic reaction to oxaliplatin, other chemotherapy, other medicines, foods, dyes, or preservatives  -pregnant or trying to get pregnant  -breast-feeding  How should I use this  medicine?  This drug is given as an infusion into a vein. It is administered in a hospital or clinic by a specially trained health care professional.  Talk to your pediatrician regarding the use of this medicine in children. Special care may be needed.  Overdosage: If you think you have taken too much of this medicine contact a poison control center or emergency room at once.  NOTE: This medicine is only for you. Do not share this medicine with others.  What if I miss a dose?  It is important not to miss a dose. Call your doctor or health care professional if you are unable to keep an appointment.  What may interact with this medicine?  -medicines to increase blood counts like filgrastim, pegfilgrastim, sargramostim  -probenecid  -some antibiotics like amikacin, gentamicin, neomycin, polymyxin B, streptomycin, tobramycin  -zalcitabine  Talk to your doctor or health care professional before taking any of these medicines:  -acetaminophen  -aspirin  -ibuprofen  -ketoprofen  -naproxen  This list may not describe all possible interactions. Give your health care provider a list of all the medicines, herbs, non-prescription drugs, or dietary supplements you use. Also tell them if you smoke, drink alcohol, or use illegal drugs. Some items may interact with your medicine.  What should I watch for while using this medicine?  Your condition will be monitored carefully while you are receiving this medicine. You will need important blood work done while you are taking this medicine.  This medicine can make you more sensitive to cold. Do not drink cold drinks or use ice. Cover exposed skin before coming in contact with cold temperatures or cold objects. When out in cold weather wear warm clothing and cover your mouth and nose to warm the air that goes into your lungs. Tell your doctor if you get sensitive to the cold.  This drug may make you feel generally unwell. This is not uncommon, as chemotherapy can affect healthy cells as  well as cancer cells. Report any side effects. Continue your course of treatment even though you feel ill unless your doctor tells you to stop.  In some cases, you may be given additional medicines to help with side effects. Follow all directions for their use.  Call your doctor or health care professional for advice if you get a fever, chills or sore throat, or other symptoms of a cold or flu. Do not treat yourself. This drug decreases your body's ability to fight infections. Try to avoid being around people who are sick.  This medicine may increase your risk to bruise or bleed. Call your doctor or health care professional if you notice any unusual bleeding.  Be careful brushing and flossing your teeth or using a toothpick because you may get an infection or bleed more easily. If you have any dental work done, tell your dentist you are receiving this medicine.  Avoid taking products that contain aspirin, acetaminophen, ibuprofen, naproxen, or ketoprofen unless instructed by your doctor. These   medicines may hide a fever.  Do not become pregnant while taking this medicine. Women should inform their doctor if they wish to become pregnant or think they might be pregnant. There is a potential for serious side effects to an unborn child. Talk to your health care professional or pharmacist for more information. Do not breast-feed an infant while taking this medicine.  Call your doctor or health care professional if you get diarrhea. Do not treat yourself.  What side effects may I notice from receiving this medicine?  Side effects that you should report to your doctor or health care professional as soon as possible:  -allergic reactions like skin rash, itching or hives, swelling of the face, lips, or tongue  -low blood counts - This drug may decrease the number of white blood cells, red blood cells and platelets. You may be at increased risk for infections and bleeding.  -signs of infection - fever or chills, cough, sore  throat, pain or difficulty passing urine  -signs of decreased platelets or bleeding - bruising, pinpoint red spots on the skin, black, tarry stools, nosebleeds  -signs of decreased red blood cells - unusually weak or tired, fainting spells, lightheadedness  -breathing problems  -chest pain, pressure  -cough  -diarrhea  -jaw tightness  -mouth sores  -nausea and vomiting  -pain, swelling, redness or irritation at the injection site  -pain, tingling, numbness in the hands or feet  -problems with balance, talking, walking  -redness, blistering, peeling or loosening of the skin, including inside the mouth  -trouble passing urine or change in the amount of urine  Side effects that usually do not require medical attention (report to your doctor or health care professional if they continue or are bothersome):  -changes in vision  -constipation  -hair loss  -loss of appetite  -metallic taste in the mouth or changes in taste  -stomach pain  This list may not describe all possible side effects. Call your doctor for medical advice about side effects. You may report side effects to FDA at 1-800-FDA-1088.  Where should I keep my medicine?  This drug is given in a hospital or clinic and will not be stored at home.  NOTE: This sheet is a summary. It may not cover all possible information. If you have questions about this medicine, talk to your doctor, pharmacist, or health care provider.   2015, Elsevier/Gold Standard. (2007-08-22 17:22:47)    Fluorouracil, 5-FU injection  What is this medicine?  FLUOROURACIL, 5-FU (flure oh YOOR a sil) is a chemotherapy drug. It slows the growth of cancer cells. This medicine is used to treat many types of cancer like breast cancer, colon or rectal cancer, pancreatic cancer, and stomach cancer.  This medicine may be used for other purposes; ask your health care provider or pharmacist if you have questions.  COMMON BRAND NAME(S): Adrucil  What should I tell my health care provider before I take  this medicine?  They need to know if you have any of these conditions:  -blood disorders  -dihydropyrimidine dehydrogenase (DPD) deficiency  -infection (especially a virus infection such as chickenpox, cold sores, or herpes)  -kidney disease  -liver disease  -malnourished, poor nutrition  -recent or ongoing radiation therapy  -an unusual or allergic reaction to fluorouracil, other chemotherapy, other medicines, foods, dyes, or preservatives  -pregnant or trying to get pregnant  -breast-feeding  How should I use this medicine?  This drug is given as an infusion or injection into a vein.   It is administered in a hospital or clinic by a specially trained health care professional.  Talk to your pediatrician regarding the use of this medicine in children. Special care may be needed.  Overdosage: If you think you have taken too much of this medicine contact a poison control center or emergency room at once.  NOTE: This medicine is only for you. Do not share this medicine with others.  What if I miss a dose?  It is important not to miss your dose. Call your doctor or health care professional if you are unable to keep an appointment.  What may interact with this medicine?  -allopurinol  -cimetidine  -dapsone  -digoxin  -hydroxyurea  -leucovorin  -levamisole  -medicines for seizures like ethotoin, fosphenytoin, phenytoin  -medicines to increase blood counts like filgrastim, pegfilgrastim, sargramostim  -medicines that treat or prevent blood clots like warfarin, enoxaparin, and dalteparin  -methotrexate  -metronidazole  -pyrimethamine  -some other chemotherapy drugs like busulfan, cisplatin, estramustine, vinblastine  -trimethoprim  -trimetrexate  -vaccines  Talk to your doctor or health care professional before taking any of these medicines:  -acetaminophen  -aspirin  -ibuprofen  -ketoprofen  -naproxen  This list may not describe all possible interactions. Give your health care provider a list of all the medicines, herbs,  non-prescription drugs, or dietary supplements you use. Also tell them if you smoke, drink alcohol, or use illegal drugs. Some items may interact with your medicine.  What should I watch for while using this medicine?  Visit your doctor for checks on your progress. This drug may make you feel generally unwell. This is not uncommon, as chemotherapy can affect healthy cells as well as cancer cells. Report any side effects. Continue your course of treatment even though you feel ill unless your doctor tells you to stop.  In some cases, you may be given additional medicines to help with side effects. Follow all directions for their use.  Call your doctor or health care professional for advice if you get a fever, chills or sore throat, or other symptoms of a cold or flu. Do not treat yourself. This drug decreases your body's ability to fight infections. Try to avoid being around people who are sick.  This medicine may increase your risk to bruise or bleed. Call your doctor or health care professional if you notice any unusual bleeding.  Be careful brushing and flossing your teeth or using a toothpick because you may get an infection or bleed more easily. If you have any dental work done, tell your dentist you are receiving this medicine.  Avoid taking products that contain aspirin, acetaminophen, ibuprofen, naproxen, or ketoprofen unless instructed by your doctor. These medicines may hide a fever.  Do not become pregnant while taking this medicine. Women should inform their doctor if they wish to become pregnant or think they might be pregnant. There is a potential for serious side effects to an unborn child. Talk to your health care professional or pharmacist for more information. Do not breast-feed an infant while taking this medicine.  Men should inform their doctor if they wish to father a child. This medicine may lower sperm counts.  Do not treat diarrhea with over the counter products. Contact your doctor if you  have diarrhea that lasts more than 2 days or if it is severe and watery.  This medicine can make you more sensitive to the sun. Keep out of the sun. If you cannot avoid being in the sun, wear protective   clothing and use sunscreen. Do not use sun lamps or tanning beds/booths.  What side effects may I notice from receiving this medicine?  Side effects that you should report to your doctor or health care professional as soon as possible:  -allergic reactions like skin rash, itching or hives, swelling of the face, lips, or tongue  -low blood counts - this medicine may decrease the number of white blood cells, red blood cells and platelets. You may be at increased risk for infections and bleeding.  -signs of infection - fever or chills, cough, sore throat, pain or difficulty passing urine  -signs of decreased platelets or bleeding - bruising, pinpoint red spots on the skin, black, tarry stools, blood in the urine  -signs of decreased red blood cells - unusually weak or tired, fainting spells, lightheadedness  -breathing problems  -changes in vision  -chest pain  -mouth sores  -nausea and vomiting  -pain, swelling, redness at site where injected  -pain, tingling, numbness in the hands or feet  -redness, swelling, or sores on hands or feet  -stomach pain  -unusual bleeding  Side effects that usually do not require medical attention (report to your doctor or health care professional if they continue or are bothersome):  -changes in finger or toe nails  -diarrhea  -dry or itchy skin  -hair loss  -headache  -loss of appetite  -sensitivity of eyes to the light  -stomach upset  -unusually teary eyes  This list may not describe all possible side effects. Call your doctor for medical advice about side effects. You may report side effects to FDA at 1-800-FDA-1088.  Where should I keep my medicine?  This drug is given in a hospital or clinic and will not be stored at home.  NOTE: This sheet is a summary. It may not cover all  possible information. If you have questions about this medicine, talk to your doctor, pharmacist, or health care provider.   2015, Elsevier/Gold Standard. (2007-05-31 13:53:16)

## 2014-01-22 NOTE — Telephone Encounter (Signed)
Per Pt request Lexine Baton RN, witnessed) faxed transportation application to [employer] to request rides for tx appointments, pt is aware I have to give them private information about pt's condition.

## 2014-01-22 NOTE — Progress Notes (Signed)
[facility]  Telephone:(336) [phone] Fax:(336) [phone]  ID: Mallie Linnemann OB: November 24, 1955 MR#: [id number] ACZ#:[id number] No care team member to display  DIAGNOSIS:  Metastatic colon cancer with mets to liver  INTERVAL HISTORY: Mr. Paredez is here today for follow-up and cycle 2 of treatment today. He is doing well since being home from the hospital. He has had some intermittent right sided abdominal pain. He still has significant hepatomegaly. He did well with his first treatment. His only symptom was diarrhea for 2-3 days after. He denies fever, chills, n/v, cough, rash, headache, dizziness, SOB, chest pain, palpitations, constipation, blood in urine. He occassionally has a spot of red blood in his stool. His Hgb today is 11.5.  No swelling, tenderness, numbness or tingling in his extremities.  His appetite is getting better and he is drinking lots of fluids to stay hydrated. His port a cath site is healing nicely and was easy to access.   He has had some issues getting a few of his prescriptions filled. He is going to have his pharmacy call here so we can get this issue fixed.   CURRENT TREATMENT: FOLFOX   REVIEW OF SYSTEMS: All other 10 point review of systems is negative.   PAST MEDICAL HISTORY: Past Medical History  Diagnosis Date  . Hypertension   . Malignant neoplasm of colon 12/30/2013    PAST SURGICAL HISTORY: Past Surgical History  Procedure Laterality Date  . Coronary artery bypass graft    . Flexible sigmoidoscopy N/A 12/30/2013    Procedure: FLEXIBLE SIGMOIDOSCOPY;  Surgeon: Lafayette Dragon, MD;  Location: [facility] ENDOSCOPY;  Service: Endoscopy;  Laterality: N/A;  . Flexible sigmoidoscopy N/A 01/01/2014    Procedure: FLEXIBLE SIGMOIDOSCOPY;  Surgeon: Inda Castle, MD;  Location: [place];  Service: Endoscopy;  Laterality: N/A;  with stent placement  . Colonic stent placement N/A 01/01/2014    Procedure: COLONIC STENT PLACEMENT;  Surgeon: Inda Castle, MD;   Location: [place];  Service: Endoscopy;  Laterality: N/A;  . Portacath placement N/A 01/02/2014    Procedure: INSERTION PORT-A-CATH;  Surgeon: Ralene Ok, MD;  Location: [place];  Service: General;  Laterality: N/A;    FAMILY HISTORY History reviewed. No pertinent family history.  GYNECOLOGIC HISTORY:  No LMP for male patient.   SOCIAL HISTORY: History   Social History  . Marital Status: Married    Spouse Name: N/A    Number of Children: N/A  . Years of Education: N/A   Occupational History  . Not on file.   Social History Main Topics  . Smoking status: Never Smoker   . Smokeless tobacco: Never Used  . Alcohol Use: Yes  . Drug Use: Yes    Special: Marijuana  . Sexual Activity: Not on file   Other Topics Concern  . Not on file   Social History Narrative    ADVANCED DIRECTIVES:  <no information>  HEALTH MAINTENANCE: History  Substance Use Topics  . Smoking status: Never Smoker   . Smokeless tobacco: Never Used  . Alcohol Use: Yes   Colonoscopy: PAP: Bone density: Lipid panel:  Allergies  Allergen Reactions  . Penicillins     childhood    Current Outpatient Prescriptions  Medication Sig Dispense Refill  . amLODipine (NORVASC) 5 MG tablet Take 1 tablet (5 mg total) by mouth daily. 30 tablet 0  . aspirin EC 325 MG EC tablet Take 1 tablet (325 mg total) by mouth daily. 30 tablet 0  . bismuth subsalicylate (PEPTO  BISMOL) 262 MG/15ML suspension Take 30 mLs by mouth 4 (four) times daily -  before meals and at bedtime. 360 mL 0  . diphenoxylate-atropine (LOMOTIL) 2.5-0.025 MG per tablet Take 2 tablets by mouth 4 (four) times daily. 30 tablet 0  . dronabinol (MARINOL) 5 MG capsule Take 1 capsule (5 mg total) by mouth 3 (three) times daily before meals. 60 capsule 0  . feeding supplement, RESOURCE BREEZE, (RESOURCE BREEZE) LIQD Take 1 Container by mouth 3 (three) times daily between meals. 696 mL 0  . folic acid (FOLVITE) 1 MG tablet Take 1 tablet (1 mg  total) by mouth daily. 30 tablet 0  . hydrALAZINE (APRESOLINE) 25 MG tablet Take 1 tablet (25 mg total) by mouth every 8 (eight) hours. 90 tablet 0  . isosorbide dinitrate (ISORDIL) 40 MG tablet Take 1 tablet (40 mg total) by mouth 2 (two) times daily. 60 tablet 0  . lidocaine-prilocaine (EMLA) cream Apply 1 application topically as needed. Apply to [facility] site 1 hour prior to appointment time. 30 g 12  . metoprolol succinate (TOPROL-XL) 50 MG 24 hr tablet Take 1 tablet (50 mg total) by mouth daily. Take with or immediately following a meal. 60 tablet 0  . oxyCODONE (OXY IR/ROXICODONE) 5 MG immediate release tablet Take 1 tablet (5 mg total) by mouth every 4 (four) hours as needed for severe pain. 30 tablet 0  . potassium chloride SA (K-DUR,KLOR-CON) 20 MEQ tablet Take 1 tablet (20 mEq total) by mouth 2 (two) times daily. 14 tablet 0  . pravastatin (PRAVACHOL) 20 MG tablet Take 1 tablet (20 mg total) by mouth daily at 6 PM. 30 tablet 0  . saccharomyces boulardii (FLORASTOR) 250 MG capsule Take 1 capsule (250 mg total) by mouth 2 (two) times daily. 60 capsule 0  . traZODone (DESYREL) 50 MG tablet Take 1 tablet (50 mg total) by mouth at bedtime as needed for sleep. 30 tablet 0   No current facility-administered medications for this visit.    OBJECTIVE: Filed Vitals:   01/22/14 0840  BP: 164/94  Pulse: 84  Temp: 97.8 F (36.6 C)  Resp: 18    Filed Weights   01/22/14 0840  Weight: 161 lb (73.029 kg)   ECOG FS:1 - Symptomatic but completely ambulatory Ocular: Sclerae unicteric, pupils equal, round and reactive to light Ear-nose-throat: Oropharynx clear, dentition fair Lymphatic: No cervical or supraclavicular adenopathy Lungs no rales or rhonchi, good excursion bilaterally Heart regular rate and rhythm, no murmur appreciated Abd soft, nontender, positive bowel sounds, hepatomegally MSK no focal spinal tenderness, no joint edema  Neuro: non-focal, well-oriented, appropriate affect  LAB  RESULTS: CMP     Component Value Date/Time   NA 137 01/22/2014 0848   NA 133* 01/14/2014 0520   K 3.6 01/22/2014 0848   K 3.4* 01/14/2014 0520   CL 96* 01/22/2014 0848   CL 93* 01/14/2014 0520   CO2 25 01/22/2014 0848   CO2 26 01/14/2014 0520   GLUCOSE 144* 01/22/2014 0848   GLUCOSE 123* 01/14/2014 0520   BUN 14 01/22/2014 0848   BUN 21 01/14/2014 0520   CREATININE 1.3* 01/22/2014 0848   CREATININE 1.08 01/14/2014 0520   CALCIUM 8.8 01/22/2014 0848   CALCIUM 9.0 01/14/2014 0520   PROT 7.5 01/22/2014 0848   PROT 7.0 01/14/2014 0520   ALBUMIN 2.2* 01/14/2014 0520   AST 64* 01/22/2014 0848   AST 72* 01/14/2014 0520   ALT 17 01/22/2014 0848   ALT 16 01/14/2014 0520  ALKPHOS 261* 01/22/2014 0848   ALKPHOS 305* 01/14/2014 0520   BILITOT 0.50 01/22/2014 0848   BILITOT 0.4 01/14/2014 0520   GFRNONAA 74* 01/14/2014 0520   GFRAA 86* 01/14/2014 0520   INo results found for: SPEP, UPEP Lab Results  Component Value Date   WBC 10.4* 01/22/2014   NEUTROABS 6.1 01/22/2014   HGB 11.5* 01/22/2014   HCT 34.6* 01/22/2014   MCV 86 01/22/2014   PLT 350 01/22/2014   No results found for: LABCA2 No components found for: LIDCV013 No results for input(s): INR in the last 168 hours.  STUDIES: None  ASSESSMENT/PLAN: Mr. Teaster is a very pleasant 58 yo African American male with recently diagnosed metastatic colon cancer with mets to the liver. He has had some mild right sided discomfort from the hepatomegaly that is tolerable. He has occasional spots of blood in his stool. His Hgb today is 11.5. He is feeling better since being back at home. He did well with his first treatment and will proceed with Cycle 2 of FOLFOX today. He will be treated every 2 weeks.  His alk/phos is 261 and AST 64.  We will give him a treatment schedule today.  He knows to call here with any questions or concerns and to go to the ED in the event of an emergency. We can certainly see him sooner if need be.    Eliezer Bottom, NP 01/22/2014 9:35 AM

## 2014-01-22 NOTE — Progress Notes (Signed)
1:37 PM Discussed use of CADD pump at home. Pt and wife verbalized understanding of care of pump and handling a chemo spill. Written directions and spill kit given to patient/wife.

## 2014-01-23 ENCOUNTER — Telehealth: Payer: Self-pay | Admitting: Hematology & Oncology

## 2014-01-23 NOTE — Telephone Encounter (Signed)
Received message from [place] she has been in touch with patient and they are currently looking for rides

## 2014-01-24 ENCOUNTER — Ambulatory Visit (HOSPITAL_BASED_OUTPATIENT_CLINIC_OR_DEPARTMENT_OTHER): Payer: No Typology Code available for payment source

## 2014-01-24 DIAGNOSIS — C189 Malignant neoplasm of colon, unspecified: Secondary | ICD-10-CM

## 2014-01-24 DIAGNOSIS — Z452 Encounter for adjustment and management of vascular access device: Secondary | ICD-10-CM

## 2014-01-24 MED ORDER — SODIUM CHLORIDE 0.9 % IJ SOLN
10.0000 mL | INTRAMUSCULAR | Status: DC | PRN
  Administered 2014-01-24: 10 mL
  Filled 2014-01-24: qty 10

## 2014-01-24 MED ORDER — HEPARIN SOD (PORK) LOCK FLUSH 100 UNIT/ML IV SOLN
500.0000 [IU] | Freq: Once | INTRAVENOUS | Status: AC | PRN
  Administered 2014-01-24: 500 [IU]
  Filled 2014-01-24: qty 5

## 2014-02-04 ENCOUNTER — Telehealth: Payer: Self-pay | Admitting: Hematology & Oncology

## 2014-02-04 ENCOUNTER — Other Ambulatory Visit: Payer: No Typology Code available for payment source | Admitting: Lab

## 2014-02-04 ENCOUNTER — Ambulatory Visit: Payer: No Typology Code available for payment source

## 2014-02-04 ENCOUNTER — Ambulatory Visit: Payer: No Typology Code available for payment source | Admitting: Hematology & Oncology

## 2014-02-04 NOTE — Telephone Encounter (Signed)
Pt called rescheduled 12-28 to 1-4 said he was having a hard time. RN aware pt feels depressed about chemo and will call him. [facility] still has not helped pt get a ride to his appointments

## 2014-02-06 ENCOUNTER — Telehealth: Payer: Self-pay | Admitting: Hematology & Oncology

## 2014-02-06 NOTE — Telephone Encounter (Signed)
Received call from [facility] at [place] [phone] said they haven't been able to set up a driver for pt's appointments, but they are still looking.

## 2014-02-11 ENCOUNTER — Ambulatory Visit: Payer: No Typology Code available for payment source

## 2014-02-11 ENCOUNTER — Telehealth: Payer: Self-pay | Admitting: Hematology & Oncology

## 2014-02-11 ENCOUNTER — Telehealth: Payer: Self-pay | Admitting: *Deleted

## 2014-02-11 ENCOUNTER — Other Ambulatory Visit: Payer: No Typology Code available for payment source | Admitting: Lab

## 2014-02-11 ENCOUNTER — Ambulatory Visit: Payer: No Typology Code available for payment source | Admitting: Family

## 2014-02-11 NOTE — Telephone Encounter (Signed)
I have added appt per [facility] office

## 2014-02-11 NOTE — Telephone Encounter (Signed)
Pt called still unable to get here for tx. Baxter Flattery is trying to get pt gas card and he is aware to call Abigail. I left message for Sharyn Lull to call me to schedule appointments there until we can get pt here. Dr. [name] aware and will transfer pt to a MD at [facility] if needed.

## 2014-02-11 NOTE — Telephone Encounter (Signed)
After patient rescheduled his appointment last week, several attempts were made to speak to patient regarding his treatment and any questions he may have. Voice mails were left without call back. This morning, patient was a no-show for his appointment. Attempted to call with no response. Left voice mail sharing our concern and to please call the office back. Will send a letter to the patient home.

## 2014-02-12 ENCOUNTER — Encounter: Payer: Self-pay | Admitting: *Deleted

## 2014-02-12 NOTE — Progress Notes (Signed)
[facility] Work  Clinical Social Work contacted patient at home regarding transportation concerns.  Patients appointments were originally scheduled at [facility], but due transportation barriers his appointments were transferred to [facility] at [facility]  CSW and patient discussed transportation resources and patient stated he was familiar and felt comfortable taking the bus to his appointment tomorrow.  CSW will meet with patient during his treatment time to review and complete SCAT application and arrange transportation for future appointments.  Patient was appreciative of contact and verbalized understanding of plan discussed.  CSW encouraged patient to call with questions or concerns.   Johnnye Lana, MSW, LCSW, OSW-C Clinical Social Worker [facility] [phone]

## 2014-02-13 ENCOUNTER — Ambulatory Visit (HOSPITAL_BASED_OUTPATIENT_CLINIC_OR_DEPARTMENT_OTHER): Payer: No Typology Code available for payment source

## 2014-02-13 ENCOUNTER — Other Ambulatory Visit (HOSPITAL_BASED_OUTPATIENT_CLINIC_OR_DEPARTMENT_OTHER): Payer: No Typology Code available for payment source

## 2014-02-13 ENCOUNTER — Other Ambulatory Visit: Payer: Self-pay | Admitting: Hematology & Oncology

## 2014-02-13 ENCOUNTER — Telehealth: Payer: Self-pay | Admitting: Hematology & Oncology

## 2014-02-13 DIAGNOSIS — C787 Secondary malignant neoplasm of liver and intrahepatic bile duct: Secondary | ICD-10-CM

## 2014-02-13 DIAGNOSIS — C189 Malignant neoplasm of colon, unspecified: Secondary | ICD-10-CM

## 2014-02-13 DIAGNOSIS — Z5111 Encounter for antineoplastic chemotherapy: Secondary | ICD-10-CM

## 2014-02-13 LAB — COMPREHENSIVE METABOLIC PANEL (CC13)
ALT: 14 U/L (ref 0–55)
ANION GAP: 9 meq/L (ref 3–11)
AST: 37 U/L — ABNORMAL HIGH (ref 5–34)
Albumin: 2.5 g/dL — ABNORMAL LOW (ref 3.5–5.0)
Alkaline Phosphatase: 306 U/L — ABNORMAL HIGH (ref 40–150)
BUN: 17.3 mg/dL (ref 7.0–26.0)
CALCIUM: 8.9 mg/dL (ref 8.4–10.4)
CO2: 26 mEq/L (ref 22–29)
CREATININE: 1 mg/dL (ref 0.7–1.3)
Chloride: 102 mEq/L (ref 98–109)
EGFR: >90 mL/min/{1.73_m2} (ref >90)
Glucose: 90 mg/dl (ref 70–140)
Potassium: 3.5 mEq/L (ref 3.5–5.1)
Sodium: 136 mEq/L (ref 136–145)
Total Bilirubin: 0.24 mg/dL (ref 0.20–1.20)
Total Protein: 7.3 g/dL (ref 6.4–8.3)

## 2014-02-13 LAB — CBC WITH DIFFERENTIAL/PLATELET
BASO%: 0.3 % (ref 0.0–2.0)
Basophils Absolute: 0 10*3/uL (ref 0.0–0.1)
EOS%: 2.1 % (ref 0.0–7.0)
Eosinophils Absolute: 0.3 10*3/uL (ref 0.0–0.5)
HEMATOCRIT: 33.5 % — AB (ref 38.4–49.9)
HGB: 11.4 g/dL — ABNORMAL LOW (ref 13.0–17.1)
LYMPH#: 1.1 10*3/uL (ref 0.9–3.3)
LYMPH%: 9.4 % — ABNORMAL LOW (ref 14.0–49.0)
MCH: 29.5 pg (ref 27.2–33.4)
MCHC: 34 g/dL (ref 32.0–36.0)
MCV: 86.6 fL (ref 79.3–98.0)
MONO#: 3.1 10*3/uL — ABNORMAL HIGH (ref 0.1–0.9)
MONO%: 25.6 % — ABNORMAL HIGH (ref 0.0–14.0)
NEUT#: 7.6 10*3/uL — ABNORMAL HIGH (ref 1.5–6.5)
NEUT%: 62.6 % (ref 39.0–75.0)
Platelets: 295 10*3/uL (ref 140–400)
RBC: 3.87 10*6/uL — ABNORMAL LOW (ref 4.20–5.82)
RDW: 19.8 % — ABNORMAL HIGH (ref 11.0–14.6)
WBC: 12.1 10*3/uL — AB (ref 4.0–10.3)

## 2014-02-13 LAB — LACTATE DEHYDROGENASE (CC13): LDH: 483 U/L — ABNORMAL HIGH (ref 125–245)

## 2014-02-13 LAB — TECHNOLOGIST REVIEW

## 2014-02-13 LAB — CEA: CEA: 494.5 ng/mL — ABNORMAL HIGH (ref 0.0–5.0)

## 2014-02-13 MED ORDER — ONDANSETRON 8 MG/50ML IVPB (CHCC)
8.0000 mg | Freq: Once | INTRAVENOUS | Status: AC
  Administered 2014-02-13: 8 mg via INTRAVENOUS

## 2014-02-13 MED ORDER — DEXAMETHASONE SODIUM PHOSPHATE 10 MG/ML IJ SOLN
INTRAMUSCULAR | Status: AC
  Filled 2014-02-13: qty 1

## 2014-02-13 MED ORDER — DEXAMETHASONE SODIUM PHOSPHATE 10 MG/ML IJ SOLN
10.0000 mg | Freq: Once | INTRAMUSCULAR | Status: AC
  Administered 2014-02-13: 10 mg via INTRAVENOUS

## 2014-02-13 MED ORDER — ONDANSETRON 8 MG/NS 50 ML IVPB
INTRAVENOUS | Status: AC
  Filled 2014-02-13: qty 8

## 2014-02-13 MED ORDER — FLUOROURACIL CHEMO INJECTION 2.5 GM/50ML
400.0000 mg/m2 | Freq: Once | INTRAVENOUS | Status: AC
  Administered 2014-02-13: 750 mg via INTRAVENOUS
  Filled 2014-02-13: qty 15

## 2014-02-13 MED ORDER — LEUCOVORIN CALCIUM INJECTION 350 MG
400.0000 mg/m2 | Freq: Once | INTRAMUSCULAR | Status: AC
  Administered 2014-02-13: 748 mg via INTRAVENOUS
  Filled 2014-02-13: qty 37.4

## 2014-02-13 MED ORDER — DEXTROSE 5 % IV SOLN
85.0000 mg/m2 | Freq: Once | INTRAVENOUS | Status: AC
  Administered 2014-02-13: 160 mg via INTRAVENOUS
  Filled 2014-02-13: qty 32

## 2014-02-13 MED ORDER — SODIUM CHLORIDE 0.9 % IV SOLN
1920.0000 mg/m2 | INTRAVENOUS | Status: DC
  Administered 2014-02-13: 3600 mg via INTRAVENOUS
  Filled 2014-02-13: qty 72

## 2014-02-13 MED ORDER — DEXTROSE 5 % IV SOLN
Freq: Once | INTRAVENOUS | Status: AC
  Administered 2014-02-13: 10:00:00 via INTRAVENOUS

## 2014-02-13 NOTE — Telephone Encounter (Signed)
Lauen SW called said pt had told her he wanted to switch locations. She is aware I sent Tiffany inbasket to change MD's, and she will also let Tiffany know

## 2014-02-13 NOTE — Progress Notes (Signed)
OK to treat with Alk phos-[id number] and AST-37 per Dr. [name].

## 2014-02-13 NOTE — Telephone Encounter (Signed)
Left Tiffany message to please schedule pt there for MD care

## 2014-02-13 NOTE — Patient Instructions (Signed)
[facility] Discharge Instructions for Patients Receiving Chemotherapy  Today you received the following chemotherapy agents:  Oxaliplatin, Leucovorin and 5FU  To help prevent nausea and vomiting after your treatment, we encourage you to take your nausea medication as ordered per MD.   If you develop nausea and vomiting that is not controlled by your nausea medication, call the clinic.   BELOW ARE SYMPTOMS THAT SHOULD BE REPORTED IMMEDIATELY:  *FEVER GREATER THAN 100.5 F  *CHILLS WITH OR WITHOUT FEVER  NAUSEA AND VOMITING THAT IS NOT CONTROLLED WITH YOUR NAUSEA MEDICATION  *UNUSUAL SHORTNESS OF BREATH  *UNUSUAL BRUISING OR BLEEDING  TENDERNESS IN MOUTH AND THROAT WITH OR WITHOUT PRESENCE OF ULCERS  *URINARY PROBLEMS  *BOWEL PROBLEMS  UNUSUAL RASH Items with * indicate a potential emergency and should be followed up as soon as possible.  Feel free to call the clinic you have any questions or concerns. The clinic phone number is [phone].

## 2014-02-14 ENCOUNTER — Telehealth: Payer: Self-pay | Admitting: Hematology & Oncology

## 2014-02-14 NOTE — Telephone Encounter (Signed)
Maggie RN at [facility] is aware pt needs new MD appointment at [facility] and will follow up.

## 2014-02-15 ENCOUNTER — Ambulatory Visit (HOSPITAL_BASED_OUTPATIENT_CLINIC_OR_DEPARTMENT_OTHER): Payer: No Typology Code available for payment source

## 2014-02-15 DIAGNOSIS — C189 Malignant neoplasm of colon, unspecified: Secondary | ICD-10-CM

## 2014-02-15 DIAGNOSIS — Z452 Encounter for adjustment and management of vascular access device: Secondary | ICD-10-CM

## 2014-02-15 MED ORDER — HEPARIN SOD (PORK) LOCK FLUSH 100 UNIT/ML IV SOLN
500.0000 [IU] | Freq: Once | INTRAVENOUS | Status: AC | PRN
  Administered 2014-02-15: 500 [IU]
  Filled 2014-02-15: qty 5

## 2014-02-15 MED ORDER — SODIUM CHLORIDE 0.9 % IJ SOLN
10.0000 mL | INTRAMUSCULAR | Status: DC | PRN
  Administered 2014-02-15: 10 mL
  Filled 2014-02-15: qty 10

## 2014-02-15 NOTE — Progress Notes (Signed)
Pt's blood pressure low.  Pt is on b/p medication.  RN instructed pt to call doctor that prescribes b/p medication and let them know that his b/p is running low.  Pt was appreciative and stated he would call them

## 2014-02-18 ENCOUNTER — Inpatient Hospital Stay (HOSPITAL_COMMUNITY)
Admission: EM | Admit: 2014-02-18 | Discharge: 2014-03-10 | DRG: 377 | Disposition: A | Discharge status: Home Health | Payer: Medicaid Other | Attending: Family Medicine | Admitting: Family Medicine

## 2014-02-18 ENCOUNTER — Other Ambulatory Visit: Payer: Self-pay

## 2014-02-18 ENCOUNTER — Ambulatory Visit: Payer: No Typology Code available for payment source | Admitting: Family

## 2014-02-18 ENCOUNTER — Encounter (HOSPITAL_COMMUNITY): Payer: Self-pay | Admitting: Emergency Medicine

## 2014-02-18 ENCOUNTER — Other Ambulatory Visit: Payer: No Typology Code available for payment source | Admitting: Lab

## 2014-02-18 ENCOUNTER — Ambulatory Visit: Payer: No Typology Code available for payment source

## 2014-02-18 DIAGNOSIS — R509 Fever, unspecified: Secondary | ICD-10-CM

## 2014-02-18 DIAGNOSIS — A401 Sepsis due to streptococcus, group B: Secondary | ICD-10-CM | POA: Diagnosis not present

## 2014-02-18 DIAGNOSIS — Z9221 Personal history of antineoplastic chemotherapy: Secondary | ICD-10-CM

## 2014-02-18 DIAGNOSIS — Z951 Presence of aortocoronary bypass graft: Secondary | ICD-10-CM

## 2014-02-18 DIAGNOSIS — J811 Chronic pulmonary edema: Secondary | ICD-10-CM

## 2014-02-18 DIAGNOSIS — E785 Hyperlipidemia, unspecified: Secondary | ICD-10-CM | POA: Diagnosis present

## 2014-02-18 DIAGNOSIS — Z452 Encounter for adjustment and management of vascular access device: Secondary | ICD-10-CM

## 2014-02-18 DIAGNOSIS — D72829 Elevated white blood cell count, unspecified: Secondary | ICD-10-CM | POA: Diagnosis present

## 2014-02-18 DIAGNOSIS — D62 Acute posthemorrhagic anemia: Secondary | ICD-10-CM | POA: Diagnosis present

## 2014-02-18 DIAGNOSIS — R0603 Acute respiratory distress: Secondary | ICD-10-CM

## 2014-02-18 DIAGNOSIS — C7800 Secondary malignant neoplasm of unspecified lung: Secondary | ICD-10-CM | POA: Diagnosis present

## 2014-02-18 DIAGNOSIS — Z66 Do not resuscitate: Secondary | ICD-10-CM | POA: Diagnosis present

## 2014-02-18 DIAGNOSIS — E876 Hypokalemia: Secondary | ICD-10-CM | POA: Diagnosis present

## 2014-02-18 DIAGNOSIS — E873 Alkalosis: Secondary | ICD-10-CM | POA: Diagnosis present

## 2014-02-18 DIAGNOSIS — I10 Essential (primary) hypertension: Secondary | ICD-10-CM | POA: Diagnosis present

## 2014-02-18 DIAGNOSIS — D638 Anemia in other chronic diseases classified elsewhere: Secondary | ICD-10-CM | POA: Diagnosis present

## 2014-02-18 DIAGNOSIS — Y848 Other medical procedures as the cause of abnormal reaction of the patient, or of later complication, without mention of misadventure at the time of the procedure: Secondary | ICD-10-CM | POA: Diagnosis present

## 2014-02-18 DIAGNOSIS — Z515 Encounter for palliative care: Secondary | ICD-10-CM | POA: Diagnosis not present

## 2014-02-18 DIAGNOSIS — R188 Other ascites: Secondary | ICD-10-CM | POA: Diagnosis present

## 2014-02-18 DIAGNOSIS — F329 Major depressive disorder, single episode, unspecified: Secondary | ICD-10-CM | POA: Diagnosis present

## 2014-02-18 DIAGNOSIS — Z8673 Personal history of transient ischemic attack (TIA), and cerebral infarction without residual deficits: Secondary | ICD-10-CM

## 2014-02-18 DIAGNOSIS — T80211A Bloodstream infection due to central venous catheter, initial encounter: Secondary | ICD-10-CM | POA: Diagnosis not present

## 2014-02-18 DIAGNOSIS — Z6821 Body mass index (BMI) 21.0-21.9, adult: Secondary | ICD-10-CM

## 2014-02-18 DIAGNOSIS — E872 Acidosis, unspecified: Secondary | ICD-10-CM

## 2014-02-18 DIAGNOSIS — R578 Other shock: Secondary | ICD-10-CM | POA: Diagnosis not present

## 2014-02-18 DIAGNOSIS — B955 Unspecified streptococcus as the cause of diseases classified elsewhere: Secondary | ICD-10-CM | POA: Diagnosis present

## 2014-02-18 DIAGNOSIS — R7881 Bacteremia: Secondary | ICD-10-CM

## 2014-02-18 DIAGNOSIS — E43 Unspecified severe protein-calorie malnutrition: Secondary | ICD-10-CM | POA: Diagnosis not present

## 2014-02-18 DIAGNOSIS — D649 Anemia, unspecified: Secondary | ICD-10-CM

## 2014-02-18 DIAGNOSIS — E861 Hypovolemia: Secondary | ICD-10-CM | POA: Diagnosis present

## 2014-02-18 DIAGNOSIS — I9589 Other hypotension: Secondary | ICD-10-CM

## 2014-02-18 DIAGNOSIS — K922 Gastrointestinal hemorrhage, unspecified: Secondary | ICD-10-CM

## 2014-02-18 DIAGNOSIS — Z79899 Other long term (current) drug therapy: Secondary | ICD-10-CM

## 2014-02-18 DIAGNOSIS — K921 Melena: Principal | ICD-10-CM | POA: Diagnosis present

## 2014-02-18 DIAGNOSIS — I2510 Atherosclerotic heart disease of native coronary artery without angina pectoris: Secondary | ICD-10-CM | POA: Diagnosis present

## 2014-02-18 DIAGNOSIS — C189 Malignant neoplasm of colon, unspecified: Secondary | ICD-10-CM | POA: Insufficient documentation

## 2014-02-18 DIAGNOSIS — C787 Secondary malignant neoplasm of liver and intrahepatic bile duct: Secondary | ICD-10-CM | POA: Diagnosis present

## 2014-02-18 DIAGNOSIS — K625 Hemorrhage of anus and rectum: Secondary | ICD-10-CM | POA: Diagnosis present

## 2014-02-18 DIAGNOSIS — M25511 Pain in right shoulder: Secondary | ICD-10-CM

## 2014-02-18 DIAGNOSIS — I43 Cardiomyopathy in diseases classified elsewhere: Secondary | ICD-10-CM | POA: Diagnosis present

## 2014-02-18 DIAGNOSIS — Z7982 Long term (current) use of aspirin: Secondary | ICD-10-CM

## 2014-02-18 DIAGNOSIS — R0600 Dyspnea, unspecified: Secondary | ICD-10-CM

## 2014-02-18 HISTORY — DX: Occlusion and stenosis of unspecified cerebral artery: I66.9

## 2014-02-18 LAB — PROTIME-INR
INR: 1.18 (ref 0.00–1.49)
Prothrombin Time: 15.1 seconds (ref 11.6–15.2)

## 2014-02-18 LAB — COMPREHENSIVE METABOLIC PANEL
ALT: 14 U/L (ref 0–53)
AST: 33 U/L (ref 0–37)
Albumin: 2.6 g/dL — ABNORMAL LOW (ref 3.5–5.2)
Alkaline Phosphatase: 191 U/L — ABNORMAL HIGH (ref 39–117)
Anion gap: 7 (ref 5–15)
BILIRUBIN TOTAL: 0.7 mg/dL (ref 0.3–1.2)
BUN: 18 mg/dL (ref 6–23)
CO2: 25 mmol/L (ref 19–32)
CREATININE: 1.09 mg/dL (ref 0.50–1.35)
Calcium: 7.9 mg/dL — ABNORMAL LOW (ref 8.4–10.5)
Chloride: 101 mEq/L (ref 96–112)
GFR calc Af Amer: 85 mL/min — ABNORMAL LOW (ref >90)
GFR calc non Af Amer: 73 mL/min — ABNORMAL LOW (ref >90)
Glucose, Bld: 127 mg/dL — ABNORMAL HIGH (ref 70–99)
POTASSIUM: 2.6 mmol/L — AB (ref 3.5–5.1)
SODIUM: 133 mmol/L — AB (ref 135–145)
TOTAL PROTEIN: 6.2 g/dL (ref 6.0–8.3)

## 2014-02-18 LAB — I-STAT TROPONIN, ED: Troponin i, poc: 0.02 ng/mL (ref 0.00–0.08)

## 2014-02-18 LAB — CBC
HEMATOCRIT: 25.8 % — AB (ref 39.0–52.0)
HEMOGLOBIN: 8.6 g/dL — AB (ref 13.0–17.0)
MCH: 28.6 pg (ref 26.0–34.0)
MCHC: 33.3 g/dL (ref 30.0–36.0)
MCV: 85.7 fL (ref 78.0–100.0)
PLATELETS: 233 10*3/uL (ref 150–400)
RBC: 3.01 MIL/uL — ABNORMAL LOW (ref 4.22–5.81)
RDW: 19.3 % — AB (ref 11.5–15.5)
WBC: 10 10*3/uL (ref 4.0–10.5)

## 2014-02-18 LAB — MAGNESIUM: Magnesium: 1.9 mg/dL (ref 1.5–2.5)

## 2014-02-18 MED ORDER — SODIUM CHLORIDE 0.9 % IV SOLN
80.0000 mg | Freq: Once | INTRAVENOUS | Status: AC
  Administered 2014-02-18: 80 mg via INTRAVENOUS
  Filled 2014-02-18: qty 80

## 2014-02-18 MED ORDER — SODIUM CHLORIDE 0.9 % IV BOLUS (SEPSIS)
1000.0000 mL | Freq: Once | INTRAVENOUS | Status: AC
  Administered 2014-02-18: 1000 mL via INTRAVENOUS

## 2014-02-18 MED ORDER — SODIUM CHLORIDE 0.9 % IV SOLN: Freq: Once | INTRAVENOUS | Status: DC

## 2014-02-18 MED ORDER — SODIUM CHLORIDE 0.9 % IV SOLN
INTRAVENOUS | Status: DC
  Administered 2014-02-19: via INTRAVENOUS

## 2014-02-18 MED ORDER — POTASSIUM CHLORIDE 10 MEQ/100ML IV SOLN
10.0000 meq | INTRAVENOUS | Status: DC
  Administered 2014-02-19: 10 meq via INTRAVENOUS
  Filled 2014-02-18: qty 100

## 2014-02-18 NOTE — ED Notes (Signed)
Per EMS-patient has been feeling week x24 hours. 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 Received chemotherapy x2 days ago. EMS found blood clots on the floor. Patient was cool, gray, and cyanotic. Feeling dizziness and lightheadedness. Hx CABG, MI. Had lateral elevation and anterior depression on 12 Lead EKG. Read as left BBB-unsure if this is a new change for patient. VS: BP 82/58 HR 80 RR 16 SpO2 99% on RA. CBG 173 mg/dl.

## 2014-02-18 NOTE — ED Notes (Signed)
Bed: KD59 Expected date: 02/18/14 Expected time: 9:22 PM Means of arrival: Ambulance Comments: Gi bleed, hypotension

## 2014-02-18 NOTE — ED Provider Notes (Addendum)
CSN: [id number]     Arrival date & time 02/18/14  2152 History   First MD Initiated Contact with Patient 02/18/14 2203     Chief Complaint  Patient presents with  . Hypotension  . GI Bleeding     (Consider location/radiation/quality/duration/timing/severity/associated sxs/prior Treatment) The history is provided by the patient.  pt with hx stage IV colon cancer, liver mets, presents w several episodes red blood per rectum since early morning. Mod-severe. Episodic. No specific exacerbating or alleviating factors.  Pt began to feel progressively weak and lightheaded tonight.  Ems arrived, pts bp low. Pt reports recently received his 3rd round of chemo 3 days ago. Denies fever or chills. Denies abd pain. Denies hx gi bleeding, pud, or diverticula. denes any anticoagulant use. Does take an aspirin a day. No other nsaid use.      Past Medical History  Diagnosis Date  . Hypertension   . Malignant neoplasm of colon 12/30/2013  . Cerebral embolism    Past Surgical History  Procedure Laterality Date  . Coronary artery bypass graft    . Flexible sigmoidoscopy N/A 12/30/2013    Procedure: FLEXIBLE SIGMOIDOSCOPY;  Surgeon: Lafayette Dragon, MD;  Location: [facility] ENDOSCOPY;  Service: Endoscopy;  Laterality: N/A;  . Flexible sigmoidoscopy N/A 01/01/2014    Procedure: FLEXIBLE SIGMOIDOSCOPY;  Surgeon: Inda Castle, MD;  Location: [place];  Service: Endoscopy;  Laterality: N/A;  with stent placement  . Colonic stent placement N/A 01/01/2014    Procedure: COLONIC STENT PLACEMENT;  Surgeon: Inda Castle, MD;  Location: [place];  Service: Endoscopy;  Laterality: N/A;  . Portacath placement N/A 01/02/2014    Procedure: INSERTION PORT-A-CATH;  Surgeon: Ralene Ok, MD;  Location: [place];  Service: General;  Laterality: N/A;   History reviewed. No pertinent family history. History  Substance Use Topics  . Smoking status: Never Smoker   . Smokeless tobacco: Never Used  . Alcohol Use: Yes     Review of Systems  Constitutional: Negative for fever and chills.  HENT: Negative for nosebleeds.   Eyes: Negative for redness.  Respiratory: Negative for shortness of breath.   Cardiovascular: Negative for chest pain.  Gastrointestinal: Positive for blood in stool. Negative for vomiting, abdominal pain and diarrhea.  Endocrine: Negative for polyuria.  Genitourinary: Negative for hematuria and flank pain.  Musculoskeletal: Negative for back pain and neck pain.  Skin: Negative for rash.  Neurological: Positive for weakness and light-headedness. Negative for headaches.  Hematological: Does not bruise/bleed easily.  Psychiatric/Behavioral: Negative for confusion.      Allergies  Penicillins  Home Medications   Prior to Admission medications   Medication Sig Start Date End Date Taking? Authorizing Provider  amLODipine (NORVASC) 5 MG tablet Take 1 tablet (5 mg total) by mouth daily. 01/14/14   Robbie Lis, MD  aspirin EC 325 MG EC tablet Take 1 tablet (325 mg total) by mouth daily. 01/14/14   Robbie Lis, MD  bismuth subsalicylate (PEPTO BISMOL) 262 MG/15ML suspension Take 30 mLs by mouth 4 (four) times daily -  before meals and at bedtime. 01/14/14   Robbie Lis, MD  diphenoxylate-atropine (LOMOTIL) 2.5-0.025 MG per tablet Take 2 tablets by mouth 4 (four) times daily. 01/14/14   Robbie Lis, MD  dronabinol (MARINOL) 5 MG capsule Take 1 capsule (5 mg total) by mouth 3 (three) times daily before meals. 01/14/14   Robbie Lis, MD  feeding supplement, RESOURCE BREEZE, (RESOURCE BREEZE) LIQD Take 1 Container by  mouth 3 (three) times daily between meals. 01/14/14   Robbie Lis, MD  folic acid (FOLVITE) 1 MG tablet Take 1 tablet (1 mg total) by mouth daily. 01/14/14   Robbie Lis, MD  hydrALAZINE (APRESOLINE) 25 MG tablet Take 1 tablet (25 mg total) by mouth every 8 (eight) hours. 01/14/14   Robbie Lis, MD  isosorbide dinitrate (ISORDIL) 40 MG tablet Take 1 tablet (40 mg total)  by mouth 2 (two) times daily. 01/14/14   Robbie Lis, MD  lidocaine-prilocaine (EMLA) cream Apply 1 application topically as needed. Apply to [facility] site 1 hour prior to appointment time. 01/22/14   Eliezer Bottom, NP  metoprolol succinate (TOPROL-XL) 50 MG 24 hr tablet Take 1 tablet (50 mg total) by mouth daily. Take with or immediately following a meal. 01/14/14   Robbie Lis, MD  oxyCODONE (OXY IR/ROXICODONE) 5 MG immediate release tablet Take 1 tablet (5 mg total) by mouth every 4 (four) hours as needed for severe pain. 01/14/14   Robbie Lis, MD  potassium chloride SA (K-DUR,KLOR-CON) 20 MEQ tablet Take 1 tablet (20 mEq total) by mouth 2 (two) times daily. 01/14/14   Robbie Lis, MD  pravastatin (PRAVACHOL) 20 MG tablet Take 1 tablet (20 mg total) by mouth daily at 6 PM. 01/14/14   Robbie Lis, MD  saccharomyces boulardii (FLORASTOR) 250 MG capsule Take 1 capsule (250 mg total) by mouth 2 (two) times daily. 01/14/14   Robbie Lis, MD  traZODone (DESYREL) 50 MG tablet Take 1 tablet (50 mg total) by mouth at bedtime as needed for sleep. 01/14/14   Robbie Lis, MD   BP 89/63 mmHg  Pulse 79  Temp(Src) 97.6 F (36.4 C) (Oral)  Resp 31  SpO2 100% Physical Exam  Constitutional: He is oriented to person, place, and time. He appears distressed.  Pt hypotensive, weak appearing.   HENT:  Head: Atraumatic.  Nose: Nose normal.  Mouth/Throat: Oropharynx is clear and moist.  Eyes: Conjunctivae are normal. No scleral icterus.  Neck: Neck supple. No tracheal deviation present.  Cardiovascular: Normal rate, regular rhythm, normal heart sounds and intact distal pulses.   Pulmonary/Chest: Effort normal and breath sounds normal. No accessory muscle usage. No respiratory distress.  portacath left chest without sign of infection  Abdominal: Soft. Bowel sounds are normal. He exhibits no distension. There is no tenderness. There is no rebound and no guarding.  Genitourinary:  No cva tenderness.  Rectal, small amount watery/liquid blood, heme pos  Musculoskeletal: Normal range of motion. He exhibits no edema or tenderness.  Neurological: He is alert and oriented to person, place, and time.  Skin: Skin is warm and dry. He is not diaphoretic. There is pallor.  Psychiatric: He has a normal mood and affect.  Nursing note and vitals reviewed.   ED Course  Procedures (including critical care time) Labs Review   Results for orders placed or performed during the hospital encounter of 02/18/14  Comprehensive metabolic panel  Result Value Ref Range   Sodium 133 (L) 135 - 145 mmol/L   Potassium 2.6 (LL) 3.5 - 5.1 mmol/L   Chloride 101 96 - 112 mEq/L   CO2 25 19 - 32 mmol/L   Glucose, Bld 127 (H) 70 - 99 mg/dL   BUN 18 6 - 23 mg/dL   Creatinine, Ser 1.09 0.50 - 1.35 mg/dL   Calcium 7.9 (L) 8.4 - 10.5 mg/dL   Total Protein 6.2 6.0 - 8.3 g/dL  Albumin 2.6 (L) 3.5 - 5.2 g/dL   AST 33 0 - 37 U/L   ALT 14 0 - 53 U/L   Alkaline Phosphatase 191 (H) 39 - 117 U/L   Total Bilirubin 0.7 0.3 - 1.2 mg/dL   GFR calc non Af Amer 73 (L) >90 mL/min   GFR calc Af Amer 85 (L) >90 mL/min   Anion gap 7 5 - 15  CBC  Result Value Ref Range   WBC 10.0 4.0 - 10.5 K/uL   RBC 3.01 (L) 4.22 - 5.81 MIL/uL   Hemoglobin 8.6 (L) 13.0 - 17.0 g/dL   HCT 25.8 (L) 39.0 - 52.0 %   MCV 85.7 78.0 - 100.0 fL   MCH 28.6 26.0 - 34.0 pg   MCHC 33.3 30.0 - 36.0 g/dL   RDW 19.3 (H) 11.5 - 15.5 %   Platelets 233 150 - 400 K/uL  Protime-INR  Result Value Ref Range   Prothrombin Time 15.1 11.6 - 15.2 seconds   INR 1.18 0.00 - 1.49  I-Stat Troponin, ED (not at [facility])  Result Value Ref Range   Troponin i, poc 0.02 0.00 - 0.08 ng/mL   Comment 3          Type and screen for Red Blood Exchange  Result Value Ref Range   ABO/RH(D) A POS    Antibody Screen POS    Sample Expiration 02/21/2014    Antibody Identification PENDING    DAT, IgG NEG        EKG Interpretation   Date/Time:  Monday February 18 2014  21:53:37 EST Ventricular Rate:  79 PR Interval:  175 QRS Duration: 127 QT Interval:  488 QTC Calculation: 559 R Axis:   -16 Text Interpretation:  Sinus rhythm Multiform ventricular premature  complexes Probable left atrial enlargement Left bundle branch block  Baseline wander in lead(s) V3 since last tracing no significant change  Confirmed by BELFI  MD, MELANIE ([id number]) on 02/18/2014 10:00:07 PM      MDM   Iv ns bolus. Continuous pulse ox and monitor.  o2 [place].   2nd large bore iv line, pt also has portacath.  protonix iv.   Type and cross.   Transfuse.  Reviewed nursing notes and prior charts for additional history.   Discussed pt with Dr Kaplan/GI, including hypotension, drop in hgb, recent hx - he will see/consult, med service to admit.  Will transfuse 2 units prbc.   hospitalists paged to admit.   bp improved.   k low, kcl 10 meq iv over 1 hr x 3. Mg added to labs.   CRITICAL CARE  RE acute gi bleeding, hypotension, stage IV cancer, hypokalemia Performed by: Mirna Mires Total critical care time: 40 Critical care time was exclusive of separately billable procedures and treating other patients. Critical care was necessary to treat or prevent imminent or life-threatening deterioration. Critical care was time spent personally by me on the following activities: development of treatment plan with patient and/or surrogate as well as nursing, discussions with consultants, evaluation of patient's response to treatment, examination of patient, obtaining history from patient or surrogate, ordering and performing treatments and interventions, ordering and review of laboratory studies, ordering and review of radiographic studies, pulse oximetry and re-evaluation of patient's condition.       Mirna Mires, MD 02/18/14 [phone]

## 2014-02-19 ENCOUNTER — Encounter (HOSPITAL_COMMUNITY): Payer: Self-pay | Admitting: Gastroenterology

## 2014-02-19 ENCOUNTER — Encounter (HOSPITAL_COMMUNITY)
Admission: EM | Disposition: A | Discharge status: Home Health | Payer: Self-pay | Source: Home / Self Care | Attending: Family Medicine

## 2014-02-19 ENCOUNTER — Telehealth: Payer: Self-pay | Admitting: Hematology

## 2014-02-19 ENCOUNTER — Encounter (HOSPITAL_COMMUNITY)
Admission: EM | Disposition: A | Discharge status: Home Health | Payer: No Typology Code available for payment source | Source: Home / Self Care | Attending: Family Medicine

## 2014-02-19 DIAGNOSIS — R42 Dizziness and giddiness: Secondary | ICD-10-CM

## 2014-02-19 DIAGNOSIS — R630 Anorexia: Secondary | ICD-10-CM

## 2014-02-19 DIAGNOSIS — Z515 Encounter for palliative care: Secondary | ICD-10-CM

## 2014-02-19 DIAGNOSIS — R531 Weakness: Secondary | ICD-10-CM

## 2014-02-19 DIAGNOSIS — K922 Gastrointestinal hemorrhage, unspecified: Secondary | ICD-10-CM | POA: Insufficient documentation

## 2014-02-19 DIAGNOSIS — I6340 Cerebral infarction due to embolism of unspecified cerebral artery: Secondary | ICD-10-CM

## 2014-02-19 DIAGNOSIS — K625 Hemorrhage of anus and rectum: Secondary | ICD-10-CM

## 2014-02-19 DIAGNOSIS — E876 Hypokalemia: Secondary | ICD-10-CM

## 2014-02-19 DIAGNOSIS — I10 Essential (primary) hypertension: Secondary | ICD-10-CM

## 2014-02-19 HISTORY — PX: FLEXIBLE SIGMOIDOSCOPY: SHX5431

## 2014-02-19 LAB — CBC WITH DIFFERENTIAL/PLATELET
BASOS PCT: 0 % (ref 0–1)
Basophils Absolute: 0 10*3/uL (ref 0.0–0.1)
Eosinophils Absolute: 0 10*3/uL (ref 0.0–0.7)
Eosinophils Relative: 0 % (ref 0–5)
HEMATOCRIT: 21.7 % — AB (ref 39.0–52.0)
HEMOGLOBIN: 7.4 g/dL — AB (ref 13.0–17.0)
Lymphocytes Relative: 21 % (ref 12–46)
Lymphs Abs: 2.7 10*3/uL (ref 0.7–4.0)
MCH: 28.6 pg (ref 26.0–34.0)
MCHC: 34.1 g/dL (ref 30.0–36.0)
MCV: 83.8 fL (ref 78.0–100.0)
MONO ABS: 1 10*3/uL (ref 0.1–1.0)
MONOS PCT: 7 % (ref 3–12)
Neutro Abs: 9.2 10*3/uL — ABNORMAL HIGH (ref 1.7–7.7)
Neutrophils Relative %: 72 % (ref 43–77)
Platelets: 186 10*3/uL (ref 150–400)
RBC: 2.59 MIL/uL — ABNORMAL LOW (ref 4.22–5.81)
RDW: 17.6 % — AB (ref 11.5–15.5)
WBC: 12.9 10*3/uL — ABNORMAL HIGH (ref 4.0–10.5)

## 2014-02-19 LAB — COMPREHENSIVE METABOLIC PANEL
ALT: 11 U/L (ref 0–53)
ANION GAP: 7 (ref 5–15)
AST: 29 U/L (ref 0–37)
Albumin: 2.2 g/dL — ABNORMAL LOW (ref 3.5–5.2)
Alkaline Phosphatase: 141 U/L — ABNORMAL HIGH (ref 39–117)
BUN: 19 mg/dL (ref 6–23)
CALCIUM: 7.4 mg/dL — AB (ref 8.4–10.5)
CO2: 22 mmol/L (ref 19–32)
CREATININE: 1.17 mg/dL (ref 0.50–1.35)
Chloride: 104 mEq/L (ref 96–112)
GFR calc non Af Amer: 67 mL/min — ABNORMAL LOW (ref >90)
GFR, EST AFRICAN AMERICAN: 78 mL/min — AB (ref >90)
GLUCOSE: 104 mg/dL — AB (ref 70–99)
Potassium: 4 mmol/L (ref 3.5–5.1)
Sodium: 133 mmol/L — ABNORMAL LOW (ref 135–145)
Total Bilirubin: 0.8 mg/dL (ref 0.3–1.2)
Total Protein: 5.1 g/dL — ABNORMAL LOW (ref 6.0–8.3)

## 2014-02-19 LAB — HEMOGLOBIN AND HEMATOCRIT, BLOOD
HEMATOCRIT: 17.9 % — AB (ref 39.0–52.0)
Hemoglobin: 6.2 g/dL — CL (ref 13.0–17.0)

## 2014-02-19 LAB — PREPARE RBC (CROSSMATCH)

## 2014-02-19 LAB — PROTIME-INR
INR: 1.24 (ref 0.00–1.49)
Prothrombin Time: 15.7 seconds — ABNORMAL HIGH (ref 11.6–15.2)

## 2014-02-19 LAB — MRSA PCR SCREENING: MRSA BY PCR: NEGATIVE

## 2014-02-19 SURGERY — SIGMOIDOSCOPY, FLEXIBLE
Anesthesia: Moderate Sedation

## 2014-02-19 MED ORDER — POTASSIUM CHLORIDE CRYS ER 20 MEQ PO TBCR: 40.0000 meq | EXTENDED_RELEASE_TABLET | Freq: Two times a day (BID) | ORAL | Status: DC

## 2014-02-19 MED ORDER — FENTANYL CITRATE 0.05 MG/ML IJ SOLN
INTRAMUSCULAR | Status: AC
  Filled 2014-02-19: qty 4

## 2014-02-19 MED ORDER — SODIUM CHLORIDE 0.9 % IJ SOLN
3.0000 mL | Freq: Two times a day (BID) | INTRAMUSCULAR | Status: DC
  Administered 2014-02-19 – 2014-03-10 (×17): 3 mL via INTRAVENOUS

## 2014-02-19 MED ORDER — DRONABINOL 5 MG PO CAPS
5.0000 mg | ORAL_CAPSULE | Freq: Three times a day (TID) | ORAL | Status: DC
  Administered 2014-02-19 – 2014-03-10 (×55): 5 mg via ORAL
  Filled 2014-02-19 (×6): qty 2
  Filled 2014-02-19: qty 1
  Filled 2014-02-19 (×8): qty 2
  Filled 2014-02-19 (×2): qty 1
  Filled 2014-02-19: qty 2
  Filled 2014-02-19: qty 1
  Filled 2014-02-19 (×4): qty 2
  Filled 2014-02-19: qty 1
  Filled 2014-02-19: qty 2
  Filled 2014-02-19: qty 1
  Filled 2014-02-19: qty 2
  Filled 2014-02-19: qty 1
  Filled 2014-02-19: qty 2
  Filled 2014-02-19: qty 1
  Filled 2014-02-19 (×2): qty 2
  Filled 2014-02-19 (×2): qty 1
  Filled 2014-02-19: qty 2
  Filled 2014-02-19: qty 1
  Filled 2014-02-19 (×3): qty 2
  Filled 2014-02-19 (×2): qty 1
  Filled 2014-02-19 (×8): qty 2
  Filled 2014-02-19: qty 1
  Filled 2014-02-19 (×4): qty 2
  Filled 2014-02-19: qty 1
  Filled 2014-02-19: qty 2

## 2014-02-19 MED ORDER — BISMUTH SUBSALICYLATE 262 MG/15ML PO SUSP
30.0000 mL | Freq: Three times a day (TID) | ORAL | Status: DC
  Filled 2014-02-19: qty 236

## 2014-02-19 MED ORDER — SODIUM CHLORIDE 0.9 % IV BOLUS (SEPSIS)
500.0000 mL | Freq: Once | INTRAVENOUS | Status: AC
  Administered 2014-02-19: 500 mL via INTRAVENOUS

## 2014-02-19 MED ORDER — ACETAMINOPHEN 650 MG RE SUPP: 650.0000 mg | Freq: Four times a day (QID) | RECTAL | Status: DC | PRN

## 2014-02-19 MED ORDER — ACETAMINOPHEN 325 MG PO TABS
650.0000 mg | ORAL_TABLET | Freq: Four times a day (QID) | ORAL | Status: DC | PRN
  Administered 2014-02-21 – 2014-03-04 (×6): 650 mg via ORAL
  Filled 2014-02-19 (×6): qty 2

## 2014-02-19 MED ORDER — FOLIC ACID 1 MG PO TABS
1.0000 mg | ORAL_TABLET | Freq: Every day | ORAL | Status: DC
  Administered 2014-02-20 – 2014-03-10 (×18): 1 mg via ORAL
  Filled 2014-02-19 (×21): qty 1

## 2014-02-19 MED ORDER — SACCHAROMYCES BOULARDII 250 MG PO CAPS
250.0000 mg | ORAL_CAPSULE | Freq: Two times a day (BID) | ORAL | Status: DC
  Administered 2014-02-19 – 2014-03-10 (×36): 250 mg via ORAL
  Filled 2014-02-19 (×42): qty 1

## 2014-02-19 MED ORDER — PRAVASTATIN SODIUM 20 MG PO TABS
20.0000 mg | ORAL_TABLET | Freq: Every day | ORAL | Status: DC
  Administered 2014-02-20 – 2014-03-09 (×18): 20 mg via ORAL
  Filled 2014-02-19 (×22): qty 1

## 2014-02-19 MED ORDER — MIDAZOLAM HCL 10 MG/2ML IJ SOLN
INTRAMUSCULAR | Status: DC | PRN
  Administered 2014-02-19: 2 mg via INTRAVENOUS
  Administered 2014-02-19 (×2): 1 mg via INTRAVENOUS

## 2014-02-19 MED ORDER — SODIUM CHLORIDE 0.9 % IV SOLN: Freq: Once | INTRAVENOUS | Status: DC

## 2014-02-19 MED ORDER — POTASSIUM CHLORIDE IN NACL 40-0.9 MEQ/L-% IV SOLN
INTRAVENOUS | Status: DC
  Administered 2014-02-19 – 2014-02-25 (×13): 100 mL/h via INTRAVENOUS
  Filled 2014-02-19 (×21): qty 1000

## 2014-02-19 MED ORDER — FLEET ENEMA 7-19 GM/118ML RE ENEM
2.0000 | ENEMA | Freq: Once | RECTAL | Status: AC
  Administered 2014-02-19: 2 via RECTAL
  Filled 2014-02-19: qty 2

## 2014-02-19 MED ORDER — MIDAZOLAM HCL 10 MG/2ML IJ SOLN
INTRAMUSCULAR | Status: AC
  Filled 2014-02-19: qty 2

## 2014-02-19 MED ORDER — ONDANSETRON HCL 4 MG/2ML IJ SOLN: 4.0000 mg | Freq: Four times a day (QID) | INTRAMUSCULAR | Status: DC | PRN

## 2014-02-19 MED ORDER — POTASSIUM CHLORIDE IN NACL 20-0.9 MEQ/L-% IV SOLN
INTRAVENOUS | Status: DC
  Administered 2014-02-19: 04:00:00 via INTRAVENOUS
  Filled 2014-02-19: qty 1000

## 2014-02-19 MED ORDER — POTASSIUM CHLORIDE 10 MEQ/50ML IV SOLN
10.0000 meq | INTRAVENOUS | Status: AC
  Administered 2014-02-19 (×3): 10 meq via INTRAVENOUS
  Filled 2014-02-19 (×3): qty 50

## 2014-02-19 MED ORDER — CETYLPYRIDINIUM CHLORIDE 0.05 % MT LIQD
7.0000 mL | Freq: Two times a day (BID) | OROMUCOSAL | Status: DC
  Administered 2014-02-19 – 2014-02-24 (×8): 7 mL via OROMUCOSAL

## 2014-02-19 MED ORDER — TRAZODONE HCL 50 MG PO TABS
50.0000 mg | ORAL_TABLET | Freq: Every evening | ORAL | Status: DC | PRN
  Administered 2014-02-23: 50 mg via ORAL
  Filled 2014-02-19: qty 1

## 2014-02-19 MED ORDER — FENTANYL CITRATE 0.05 MG/ML IJ SOLN
INTRAMUSCULAR | Status: DC | PRN
  Administered 2014-02-19 (×2): 25 ug via INTRAVENOUS

## 2014-02-19 MED ORDER — ONDANSETRON HCL 4 MG PO TABS: 4.0000 mg | ORAL_TABLET | Freq: Four times a day (QID) | ORAL | Status: DC | PRN

## 2014-02-19 MED ORDER — OXYCODONE HCL 5 MG PO TABS
5.0000 mg | ORAL_TABLET | ORAL | Status: DC | PRN
  Administered 2014-02-21 – 2014-03-08 (×8): 5 mg via ORAL
  Filled 2014-02-19 (×8): qty 1

## 2014-02-19 MED ORDER — CHLORHEXIDINE GLUCONATE 0.12 % MT SOLN
15.0000 mL | Freq: Two times a day (BID) | OROMUCOSAL | Status: DC
  Administered 2014-02-19 – 2014-02-24 (×11): 15 mL via OROMUCOSAL
  Filled 2014-02-19 (×14): qty 15

## 2014-02-19 NOTE — Progress Notes (Signed)
Spoke to technician in the blood bank regarding the status of patients blood. Informed that patient has an antibody and blood preparation will take a while longer. Patients blood pressure and heart rate are stable at this time. Will continue to monitor. Luther Parody, RN

## 2014-02-19 NOTE — Progress Notes (Signed)
CRITICAL VALUE ALERT  Critical value received:  Hgb 6.2   Date of notification:  02/19/14    Time of notification:  1946  Critical value read back:Yes.    Nurse who received alert:  Worthy Keeler   MD notified (1st page):  K Schorr.  Time of first page:  2005  MD notified (2nd page):  Time of second page:  Responding MD:    Time MD responded:

## 2014-02-19 NOTE — Progress Notes (Signed)
UR completed 

## 2014-02-19 NOTE — Progress Notes (Signed)
Patient is bleeding diffusely from the tumor.  Areas were cauterized and ablated utilizing the argon plasma coagulator.  Patient will need repeat application if bleeding does not significantly decrease

## 2014-02-19 NOTE — Progress Notes (Signed)
INITIAL NUTRITION ASSESSMENT  DOCUMENTATION CODES Per approved criteria  -Severe malnutrition in the context of chronic illness  Pt meets criteria for severe MALNUTRITION in the context of chronic illness as evidenced by severe muscle depletion, 9% weight loss x 1 month and energy intake <75% for >/= 1 month.  INTERVENTION: -Diet advancement per MD -Recommend Resource Breeze po TID, each supplement provides 250 kcal and 9 grams of protein while on CL diet -Recommend Ensure Complete po BID, each supplement provides 350 kcal and 13 grams of protein if diet advanced past clear liquids -Encouraged PO intake -RD to continue to monitor  NUTRITION DIAGNOSIS: Increased nutrient (protein) needs related to colon cancer with mets as evidenced by 9% weight loss x 1 month.   Goal: Pt to meet >/= 90% of their estimated nutrition needs   Monitor:  Diet advancement, weight, labs, I/O's  Reason for Assessment: Pt identified as at nutrition risk on the Malnutrition Screen Tool  Admitting Dx: BRBPR (bright red blood per rectum)  ASSESSMENT: 59 y.o. male with Past medical history of colonic stent placement and a Port-A-Cath placement for metastatic colon cancer, ongoing with chemotherapy.  Pt recently assessed/followed by RDs in previous admission (11/20-12/03). Pt was diagnosed with severe malnutrition.  Per PA note, pt will be advanced to a CL diet. When diet is advanced, pt would like to receive Boost/Ensure supplements. RD will order when appropriate.   Pt reports very poor appetite for a couple of weeks associated with chemo and diarrhea symptoms. States he hasn't been able to eat much.  Per weight history documentation, pt has lost 15 lb since December (9% weight loss x 1 month, significant for time frame).  Nutrition Focused Physical Exam:  Subcutaneous Fat:  Orbital Region: WNL Upper Arm Region: mild depletion Thoracic and Lumbar Region: NA  Muscle:  Temple Region: moderate  depletion Clavicle Bone Region: severe depletion Clavicle and Acromion Bone Region: moderate depletion Scapular Bone Region: mild depletion Dorsal Hand: WNL Patellar Region: WNL Anterior Thigh Region: WNL Posterior Calf Region: WNL  Edema: no LE edema  Labs reviewed: Low Na & K  Height: Ht Readings from Last 1 Encounters:  02/19/14 5\' 10"  (1.778 m)    Weight: Wt Readings from Last 1 Encounters:  02/19/14 146 lb 9.7 oz (66.5 kg)    Ideal Body Weight: 166 lb   % Ideal Body Weight: 88%  Wt Readings from Last 10 Encounters:  02/19/14 146 lb 9.7 oz (66.5 kg)  01/22/14 161 lb (73.029 kg)  01/02/14 155 lb 6.8 oz (70.5 kg)    Usual Body Weight: 180 lb  % Usual Body Weight: 81%  BMI:  Body mass index is 21.04 kg/(m^2).  Estimated Nutritional Needs: Kcal: 2000-2200 Protein: 100-110g Fluid: 2L/day  Skin: intact  Diet Order: Diet NPO time specified Except for: Sips with Meds, Ice Chips  EDUCATION NEEDS: -No education needs identified at this time   Intake/Output Summary (Last 24 hours) at 02/19/14 1027 Last data filed at 02/19/14 0910  Gross per 24 hour  Intake 508.67 ml  Output    526 ml  Net -17.33 ml    Last BM: 1/12 -bloody stool  Labs:   Recent Labs Lab 02/13/14 0917 02/18/14 2201 02/18/14 2202  NA 136  --  133*  K 3.5  --  2.6*  CL  --   --  101  CO2 26  --  25  BUN 17.3  --  18  CREATININE 1.0  --  1.09  CALCIUM 8.9  --  7.9*  MG  --  1.9  --   GLUCOSE 90  --  127*    CBG (last 3)  No results for input(s): GLUCAP in the last 72 hours.  Scheduled Meds: . sodium chloride   Intravenous Once  . dronabinol  5 mg Oral TID AC  . folic acid  1 mg Oral Daily  . pravastatin  20 mg Oral q1800  . saccharomyces boulardii  250 mg Oral BID  . sodium chloride  3 mL Intravenous Q12H  . sodium phosphate  2 enema Rectal Once    Continuous Infusions: . 0.9 % NaCl with KCl 40 mEq / L      Past Medical History  Diagnosis Date  . Hypertension    . Malignant neoplasm of colon 12/30/2013  . Cerebral embolism     Past Surgical History  Procedure Laterality Date  . Coronary artery bypass graft    . Flexible sigmoidoscopy N/A 12/30/2013    Procedure: FLEXIBLE SIGMOIDOSCOPY;  Surgeon: Lafayette Dragon, MD;  Location: [facility] ENDOSCOPY;  Service: Endoscopy;  Laterality: N/A;  . Flexible sigmoidoscopy N/A 01/01/2014    Procedure: FLEXIBLE SIGMOIDOSCOPY;  Surgeon: Inda Castle, MD;  Location: [place];  Service: Endoscopy;  Laterality: N/A;  with stent placement  . Colonic stent placement N/A 01/01/2014    Procedure: COLONIC STENT PLACEMENT;  Surgeon: Inda Castle, MD;  Location: [place];  Service: Endoscopy;  Laterality: N/A;  . Portacath placement N/A 01/02/2014    Procedure: INSERTION PORT-A-CATH;  Surgeon: Ralene Ok, MD;  Location: [place];  Service: General;  Laterality: N/A;    Clayton Bibles, MS, RD, LDN Pager: [phone] After Hours Pager: [phone]

## 2014-02-19 NOTE — Telephone Encounter (Signed)
LEFT MESSAGE FOR PATIENT TO RETURN CALL TO SCHEDULE NP APPT.  °

## 2014-02-19 NOTE — Progress Notes (Signed)
Pt passed large amount og bright red clots per rectum and voided in bed both pre and post fleets enemas x 2 Tamey Wanek, Beverly Gust, RN

## 2014-02-19 NOTE — Consult Note (Signed)
Patient TD:VVOH Gunning      DOB: July 13, 1955      YWV:[id number]     Consult Note from the Palliative Medicine Team at [facility] Requested by: Dr. Aileen Fass     PCP: No primary care provider on file. Reason for Consultation: [place]     Phone Number:None  Assessment of patients Current state: Mr. Petteway is a very pleasant gentleman from [place] who says he has been in the [place] for >30 yrs. He has 5 children who are mostly still in [place] (1 in [place]). He says that he has been in [place] 8 yrs and does not have the support system that he had in [place] and he has separated and is divorcing his wife currently, although he says that he stays with her the majority of time and she is very supportive and helpful to him. He is hopeful to continue chemotherapy and gain more time. He clearly understands that his disease is terminal and that time is precious. He does say that more than anything he would want to go back to [place] and spend a few weeks with his mother (she is unaware of his cancer). However, he says that the treatment is here and not in [place]. I encourage him that he can consider this between treatments or if there is a time when chemotherapy is no longer indicated. We did discuss code status as well and he needs time to consider this. He does tell me he knows he will die and he is reluctantly accepting of his fate.   Goals of Care: 1.  Code Status: FULL   2. Scope of Treatment: Continue all available and offered interventions.    4. Disposition: Home when stable.    3. Symptom Management:   1. Weakness: Continue medical treatment/interventions. Including intervention to control bleeding and blood transfusions.  2. Decreased appetite: He has had weight loss and little appetite resulting in severe calorie protein malnutrition. He tells me that he does like Ensure/Boost and I would encourage these along with small frequent meals when able to tolerate diet per GI.   4.  Psychosocial: Emotional support provided to patient during difficult discussion. Support and encouragement given during conversation.   5. Spiritual: Mr. Lebeau is very religious in his speech and seems to find comfort in his faith and beliefs as he deals with terminal illness.   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 [name] and receiving chemotherapy. PMH includes his cancer, HTN, and cerebral embolism.    ROS: + decreased appetite, + weakness, + lack of energy    PMH:  Past Medical History  Diagnosis Date  . Hypertension   . Malignant neoplasm of colon 12/30/2013  . Cerebral embolism      PSH: Past Surgical History  Procedure Laterality Date  . Coronary artery bypass graft    . Flexible sigmoidoscopy N/A 12/30/2013    Procedure: FLEXIBLE SIGMOIDOSCOPY;  Surgeon: Lafayette Dragon, MD;  Location: [facility] ENDOSCOPY;  Service: Endoscopy;  Laterality: N/A;  . Flexible sigmoidoscopy N/A 01/01/2014    Procedure: FLEXIBLE SIGMOIDOSCOPY;  Surgeon: Inda Castle, MD;  Location: [facility];  Service: Endoscopy;  Laterality: N/A;  with stent placement  . Colonic stent placement N/A 01/01/2014    Procedure: COLONIC STENT PLACEMENT;  Surgeon: Inda Castle, MD;  Location: [place];  Service: Endoscopy;  Laterality: N/A;  . Portacath placement N/A 01/02/2014    Procedure: INSERTION PORT-A-CATH;  Surgeon: Ralene Ok, MD;  Location: [place];  Service: General;  Laterality: N/A;   I have reviewed the [place] and SH and  If appropriate update it with new information. Allergies  Allergen Reactions  . Penicillins     childhood   Scheduled Meds: . sodium chloride   Intravenous Once  . antiseptic oral rinse  7 mL Mouth Rinse q12n4p  . chlorhexidine  15 mL Mouth Rinse BID  . dronabinol  5 mg Oral TID AC  . folic acid  1 mg Oral Daily  . pravastatin  20 mg Oral q1800  .  saccharomyces boulardii  250 mg Oral BID  . sodium chloride  3 mL Intravenous Q12H  . sodium phosphate  2 enema Rectal Once   Continuous Infusions: . 0.9 % NaCl with KCl 40 mEq / L 100 mL/hr (02/19/14 1040)   PRN Meds:.acetaminophen **OR** acetaminophen, ondansetron **OR** ondansetron (ZOFRAN) IV, oxyCODONE, traZODone    BP 94/66 mmHg  Pulse 86  Temp(Src) 98.6 F (37 C) (Oral)  Resp 26  Ht 5\' 10"  (1.778 m)  Wt 66.5 kg (146 lb 9.7 oz)  BMI 21.04 kg/m2  SpO2 100%   PPS: 30%   Intake/Output Summary (Last 24 hours) at 02/19/14 1305 Last data filed at 02/19/14 1114  Gross per 24 hour  Intake 818.67 ml  Output    526 ml  Net 292.67 ml   LBM: 1/12  Physical Exam:  General: NAD, lying in bed HEENT: [place]/AT, no JVD, moist mucous membranes Chest: No labored breathing, symmetric CVS: RRR Abdomen: Soft, NT, ND, +BS Ext: MAE, no edema, warm to touch Neuro: Awake, alert, oriented x 3  Labs: CBC    Component Value Date/Time   WBC 10.0 02/18/2014 2202   WBC 12.1* 02/13/2014 0916   WBC 10.4* 01/22/2014 0848   RBC 3.01* 02/18/2014 2202   RBC 3.87* 02/13/2014 0916   RBC 4.04* 01/22/2014 0848   HGB 8.6* 02/18/2014 2202   HGB 11.4* 02/13/2014 0916   HGB 11.5* 01/22/2014 0848   HCT 25.8* 02/18/2014 2202   HCT 33.5* 02/13/2014 0916   HCT 34.6* 01/22/2014 0848   PLT 233 02/18/2014 2202   PLT 295 02/13/2014 0916   PLT 350 01/22/2014 0848   MCV 85.7 02/18/2014 2202   MCV 86.6 02/13/2014 0916   MCV 86 01/22/2014 0848   MCH 28.6 02/18/2014 2202   MCH 29.5 02/13/2014 0916   MCH 28.5 01/22/2014 0848   MCHC 33.3 02/18/2014 2202   MCHC 34.0 02/13/2014 0916   MCHC 33.2 01/22/2014 0848   RDW 19.3* 02/18/2014 2202   RDW 19.8* 02/13/2014 0916   RDW 19.8* 01/22/2014 0848   LYMPHSABS 1.1 02/13/2014 0916   LYMPHSABS 1.7 01/22/2014 0848   LYMPHSABS 2.0 12/28/2013 0334   MONOABS 3.1* 02/13/2014 0916   MONOABS 2.0* 12/28/2013 0334   EOSABS 0.3 02/13/2014 0916   EOSABS 0.3  01/22/2014 0848   EOSABS 0.0 12/28/2013 0334   BASOSABS 0.0 02/13/2014 0916   BASOSABS 0.0 01/22/2014 0848   BASOSABS 0.0 12/28/2013 0334    BMET    Component Value Date/Time   NA 133* 02/18/2014 2202   NA 136 02/13/2014 0917   NA 137 01/22/2014 0848   K 2.6* 02/18/2014 2202   K 3.5 02/13/2014 0917   K 3.6 01/22/2014 0848   CL 101 02/18/2014 2202   CL 96* 01/22/2014 0848   CO2 25  02/18/2014 2202   CO2 26 02/13/2014 0917   CO2 25 01/22/2014 0848   GLUCOSE 127* 02/18/2014 2202   GLUCOSE 90 02/13/2014 0917   GLUCOSE 144* 01/22/2014 0848   BUN 18 02/18/2014 2202   BUN 17.3 02/13/2014 0917   BUN 14 01/22/2014 0848   CREATININE 1.09 02/18/2014 2202   CREATININE 1.0 02/13/2014 0917   CREATININE 1.3* 01/22/2014 0848   CALCIUM 7.9* 02/18/2014 2202   CALCIUM 8.9 02/13/2014 0917   CALCIUM 8.8 01/22/2014 0848   GFRNONAA 73* 02/18/2014 2202   GFRAA 85* 02/18/2014 2202    CMP     Component Value Date/Time   NA 133* 02/18/2014 2202   NA 136 02/13/2014 0917   NA 137 01/22/2014 0848   K 2.6* 02/18/2014 2202   K 3.5 02/13/2014 0917   K 3.6 01/22/2014 0848   CL 101 02/18/2014 2202   CL 96* 01/22/2014 0848   CO2 25 02/18/2014 2202   CO2 26 02/13/2014 0917   CO2 25 01/22/2014 0848   GLUCOSE 127* 02/18/2014 2202   GLUCOSE 90 02/13/2014 0917   GLUCOSE 144* 01/22/2014 0848   BUN 18 02/18/2014 2202   BUN 17.3 02/13/2014 0917   BUN 14 01/22/2014 0848   CREATININE 1.09 02/18/2014 2202   CREATININE 1.0 02/13/2014 0917   CREATININE 1.3* 01/22/2014 0848   CALCIUM 7.9* 02/18/2014 2202   CALCIUM 8.9 02/13/2014 0917   CALCIUM 8.8 01/22/2014 0848   PROT 6.2 02/18/2014 2202   PROT 7.3 02/13/2014 0917   PROT 7.5 01/22/2014 0848   ALBUMIN 2.6* 02/18/2014 2202   ALBUMIN 2.5* 02/13/2014 0917   AST 33 02/18/2014 2202   AST 37* 02/13/2014 0917   AST 64* 01/22/2014 0848   ALT 14 02/18/2014 2202   ALT 14 02/13/2014 0917   ALT 17 01/22/2014 0848   ALKPHOS 191* 02/18/2014 2202   ALKPHOS  306* 02/13/2014 0917   ALKPHOS 261* 01/22/2014 0848   BILITOT 0.7 02/18/2014 2202   BILITOT 0.24 02/13/2014 0917   BILITOT 0.50 01/22/2014 0848   GFRNONAA 73* 02/18/2014 2202   GFRAA 85* 02/18/2014 2202     Time In Time Out Total Time Spent with Patient Total Overall Time  1245 1355 70min 85min    Greater than 50%  of this time was spent counseling and coordinating care related to the above assessment and plan.  Vinie Sill, NP Palliative Medicine Team Pager # [phone] (M-F 8a-5p) Team Phone # [phone] (Nights/Weekends)

## 2014-02-19 NOTE — H&P (Signed)
[facility] History and Physical  Patient: [name]  GYI:[id number]  DOB: November 28, 1955  DOS: the patient was seen and examined on 02/19/2014 PCP: No primary care provider on file.  Chief Complaint: Bright red blood per rectum  HPI: [name] is a 59 y.o. male with Past medical history of colonic stent placement and a Port-A-Cath placement for metastatic colon cancer, ongoing with chemotherapy. Patient presents with an episode of bright red blood per rectum. He mentions he woke up this morning with multiple episodes of diarrhea with bright red blood denies any abdominal pain nausea or vomiting chest pain or shortness of breath cough or fever. He complains of dizziness without any vertigo or any focal deficit. He denies any bleeding anywhere else. He mentions is compliant with his medication which includes an aspirin.  The patient is coming from home. And at his baseline independent for most of his ADL.  Review of Systems: as mentioned in the history of present illness.  A Comprehensive review of the other systems is negative.  Past Medical History  Diagnosis Date  . Hypertension   . Malignant neoplasm of colon 12/30/2013  . Cerebral embolism    Past Surgical History  Procedure Laterality Date  . Coronary artery bypass graft    . Flexible sigmoidoscopy N/A 12/30/2013    Procedure: FLEXIBLE SIGMOIDOSCOPY;  Surgeon: Lafayette Dragon, MD;  Location: [facility] ENDOSCOPY;  Service: Endoscopy;  Laterality: N/A;  . Flexible sigmoidoscopy N/A 01/01/2014    Procedure: FLEXIBLE SIGMOIDOSCOPY;  Surgeon: Inda Castle, MD;  Location: [place];  Service: Endoscopy;  Laterality: N/A;  with stent placement  . Colonic stent placement N/A 01/01/2014    Procedure: COLONIC STENT PLACEMENT;  Surgeon: Inda Castle, MD;  Location: [place];  Service: Endoscopy;  Laterality: N/A;  . Portacath placement N/A 01/02/2014    Procedure: INSERTION PORT-A-CATH;  Surgeon: Ralene Ok, MD;  Location:  [place];  Service: General;  Laterality: N/A;   Social History:  reports that he has never smoked. He has never used smokeless tobacco. He reports that he drinks alcohol. He reports that he uses illicit drugs (Marijuana).  Allergies  Allergen Reactions  . Penicillins     childhood    History reviewed. No pertinent family history.  Prior to Admission medications   Medication Sig Start Date End Date Taking? Authorizing Provider  amLODipine (NORVASC) 5 MG tablet Take 1 tablet (5 mg total) by mouth daily. 01/14/14  Yes Robbie Lis, MD  aspirin EC 325 MG EC tablet Take 1 tablet (325 mg total) by mouth daily. 01/14/14  Yes Robbie Lis, MD  bismuth subsalicylate (PEPTO BISMOL) 262 MG/15ML suspension Take 30 mLs by mouth 4 (four) times daily -  before meals and at bedtime. 01/14/14  Yes Robbie Lis, MD  diphenoxylate-atropine (LOMOTIL) 2.5-0.025 MG per tablet Take 2 tablets by mouth 4 (four) times daily. 01/14/14  Yes Robbie Lis, MD  dronabinol (MARINOL) 5 MG capsule Take 1 capsule (5 mg total) by mouth 3 (three) times daily before meals. 01/14/14  Yes Robbie Lis, MD  feeding supplement, RESOURCE BREEZE, (RESOURCE BREEZE) LIQD Take 1 Container by mouth 3 (three) times daily between meals. 01/14/14  Yes Robbie Lis, MD  folic acid (FOLVITE) 1 MG tablet Take 1 tablet (1 mg total) by mouth daily. 01/14/14  Yes Robbie Lis, MD  hydrALAZINE (APRESOLINE) 25 MG tablet Take 1 tablet (25 mg total) by mouth every 8 (eight) hours. 01/14/14  Yes  Robbie Lis, MD  isosorbide dinitrate (ISORDIL) 40 MG tablet Take 1 tablet (40 mg total) by mouth 2 (two) times daily. 01/14/14  Yes Robbie Lis, MD  lidocaine-prilocaine (EMLA) cream Apply 1 application topically as needed. Apply to [facility] site 1 hour prior to appointment time. 01/22/14  Yes Eliezer Bottom, NP  metoprolol succinate (TOPROL-XL) 50 MG 24 hr tablet Take 1 tablet (50 mg total) by mouth daily. Take with or immediately following a meal. 01/14/14   Yes Robbie Lis, MD  oxyCODONE (OXY IR/ROXICODONE) 5 MG immediate release tablet Take 1 tablet (5 mg total) by mouth every 4 (four) hours as needed for severe pain. 01/14/14  Yes Robbie Lis, MD  potassium chloride SA (K-DUR,KLOR-CON) 20 MEQ tablet Take 1 tablet (20 mEq total) by mouth 2 (two) times daily. 01/14/14  Yes Robbie Lis, MD  pravastatin (PRAVACHOL) 20 MG tablet Take 1 tablet (20 mg total) by mouth daily at 6 PM. 01/14/14  Yes Robbie Lis, MD  saccharomyces boulardii (FLORASTOR) 250 MG capsule Take 1 capsule (250 mg total) by mouth 2 (two) times daily. 01/14/14  Yes Robbie Lis, MD  traZODone (DESYREL) 50 MG tablet Take 1 tablet (50 mg total) by mouth at bedtime as needed for sleep. 01/14/14  Yes Robbie Lis, MD    Physical Exam: Filed Vitals:   02/19/14 0000 02/19/14 0015 02/19/14 0100 02/19/14 0200  BP: 114/76 129/81 106/76 130/83  Pulse: 82 81 70 78  Temp:      TempSrc:      Resp: 14 17 39 31  SpO2: 98% 100% 100% 100%    General: Alert, Awake and Oriented to Time, Place and Person. Appear in mild distress Eyes: PERRL ENT: Oral Mucosa clear moist. Neck: no JVD Cardiovascular: S1 and S2 Present, no Murmur, Peripheral Pulses Present Respiratory: Bilateral Air entry equal and Decreased, Clear to Auscultation, noCrackles, no wheezes Abdomen: Bowel Sound present, Soft and no tender Skin: no Rash Extremities: no Pedal edema, no calf tenderness Neurologic: Grossly no focal neuro deficit.  Labs on Admission:  CBC:  Recent Labs Lab 02/13/14 0916 02/18/14 2202  WBC 12.1* 10.0  NEUTROABS 7.6*  --   HGB 11.4* 8.6*  HCT 33.5* 25.8*  MCV 86.6 85.7  PLT 295 233    CMP     Component Value Date/Time   NA 133* 02/18/2014 2202   NA 136 02/13/2014 0917   NA 137 01/22/2014 0848   K 2.6* 02/18/2014 2202   K 3.5 02/13/2014 0917   K 3.6 01/22/2014 0848   CL 101 02/18/2014 2202   CL 96* 01/22/2014 0848   CO2 25 02/18/2014 2202   CO2 26 02/13/2014 0917   CO2 25  01/22/2014 0848   GLUCOSE 127* 02/18/2014 2202   GLUCOSE 90 02/13/2014 0917   GLUCOSE 144* 01/22/2014 0848   BUN 18 02/18/2014 2202   BUN 17.3 02/13/2014 0917   BUN 14 01/22/2014 0848   CREATININE 1.09 02/18/2014 2202   CREATININE 1.0 02/13/2014 0917   CREATININE 1.3* 01/22/2014 0848   CALCIUM 7.9* 02/18/2014 2202   CALCIUM 8.9 02/13/2014 0917   CALCIUM 8.8 01/22/2014 0848   PROT 6.2 02/18/2014 2202   PROT 7.3 02/13/2014 0917   PROT 7.5 01/22/2014 0848   ALBUMIN 2.6* 02/18/2014 2202   ALBUMIN 2.5* 02/13/2014 0917   AST 33 02/18/2014 2202   AST 37* 02/13/2014 0917   AST 64* 01/22/2014 0848   ALT 14 02/18/2014 2202  ALT 14 02/13/2014 0917   ALT 17 01/22/2014 0848   ALKPHOS 191* 02/18/2014 2202   ALKPHOS 306* 02/13/2014 0917   ALKPHOS 261* 01/22/2014 0848   BILITOT 0.7 02/18/2014 2202   BILITOT 0.24 02/13/2014 0917   BILITOT 0.50 01/22/2014 0848   GFRNONAA 73* 02/18/2014 2202   GFRAA 85* 02/18/2014 2202    No results for input(s): LIPASE, AMYLASE in the last 168 hours. No results for input(s): AMMONIA in the last 168 hours.  No results for input(s): CKTOTAL, CKMB, CKMBINDEX, TROPONINI in the last 168 hours. BNP (last 3 results) No results for input(s): PROBNP in the last 8760 hours.  Radiological Exams on Admission: No results found.   Assessment/Plan Principal Problem:   BRBPR (bright red blood per rectum) Active Problems:   Cerebral embolism with cerebral infarction   Dizziness and giddiness   Benign essential HTN   Hypokalemia   1. BRBPR (bright red blood per rectum) The patient is presenting with right-sided blood per rectum. He had multiple episodes today. Episodes of pain lasts. Patient does not have any tenderness on examination and has good bowel sounds. And he is not taking any medication that will mask examination findings. Currently patient will be admitted in stepdown units. He is receiving one unit of PRBC and we will continue to monitor his  H&H serially. Gastroenterology has been consulted and will be following up with the patient. Patient will remain nothing by mouth except medications. IV Protonix every 24 hours.  2. History of CVA. Patient recently presented with a CVA. Currently in view of ongoing active bleeding holding off on aspirin.  3. Hypokalemia. Most likely secondary to poor oral intake. Patient also recently had completed use of chemotherapy. Therefore currently evident replacing with IV potassium and recheck his potassium in the morning. Monitor her on telemetry  4. Hypertension. Continue home medications.  Advance goals of care discussion: Full code as per my discussion with patient   Consults: Gastroenterology  DVT Prophylaxis: Mechanical compression device Nutrition: Nothing by mouth except medications  Disposition: Admitted to inpatient med-surge unit.  Author: Berle Mull, MD [facility] Pager: [phone] 02/19/2014, 2:57 AM    If 7PM-7AM, please contact night-coverage [email or web address] Password [email or web address]

## 2014-02-19 NOTE — Progress Notes (Signed)
[facility] PROGRESS NOTE  Assessment/Plan: BRBPR (bright red blood per rectum): - Hbg drop from 11.4 on 1.8.2016->8.6 to 1.11.2016, s/p 2 unit of PRBC. - On IV protonix. Currently NPO. - ASA on hold. - [facility] recommendations. - Agreed to speak to PMT.  Hypokalemia: - He is NPO change IV fluids with K.  Benign essential HTN - Currently on home medications.  Cerebral embolism with cerebral infarction - On 12.7.2016, Dx with CVA was started on ASA.   Code Status: full Family Communication: none Disposition Plan: inpatient   Consultants:  GI  Procedures:  none  Antibiotics:  None  HPI/Subjective: Relates tenesmus. Not hungry  Objective: Filed Vitals:   02/19/14 0500 02/19/14 0600 02/19/14 0630 02/19/14 0651  BP: 110/77 97/54 96/69  106/75  Pulse: 84 88 88 87  Temp:  98.2 F (36.8 C) 98.2 F (36.8 C) 98.2 F (36.8 C)  TempSrc:  Oral Oral Oral  Resp: 35 18 26 32  Height:      Weight:      SpO2: 99% 100% 100% 100%    Intake/Output Summary (Last 24 hours) at 02/19/14 0755 Last data filed at 02/19/14 0651  Gross per 24 hour  Intake    345 ml  Output    526 ml  Net   -181 ml   Filed Weights   02/19/14 0000  Weight: 66.5 kg (146 lb 9.7 oz)    Exam:  General: Alert, awake, oriented x3, in no acute distress.  HEENT: No bruits, no goiter.  Heart: Regular rate and rhythm. Lungs: Good air movement, clear Abdomen: Soft, mild epigastric tenderness,  Data Reviewed: Basic Metabolic Panel:  Recent Labs Lab 02/13/14 0917 02/18/14 2201 02/18/14 2202  NA 136  --  133*  K 3.5  --  2.6*  CL  --   --  101  CO2 26  --  25  GLUCOSE 90  --  127*  BUN 17.3  --  18  CREATININE 1.0  --  1.09  CALCIUM 8.9  --  7.9*  MG  --  1.9  --    Liver Function Tests:  Recent Labs Lab 02/13/14 0917 02/18/14 2202  AST 37* 33  ALT 14 14  ALKPHOS 306* 191*  BILITOT 0.24 0.7  PROT 7.3 6.2  ALBUMIN 2.5* 2.6*   No results for input(s): LIPASE,  AMYLASE in the last 168 hours. No results for input(s): AMMONIA in the last 168 hours. CBC:  Recent Labs Lab 02/13/14 0916 02/18/14 2202  WBC 12.1* 10.0  NEUTROABS 7.6*  --   HGB 11.4* 8.6*  HCT 33.5* 25.8*  MCV 86.6 85.7  PLT 295 233   Cardiac Enzymes: No results for input(s): CKTOTAL, CKMB, CKMBINDEX, TROPONINI in the last 168 hours. BNP (last 3 results) No results for input(s): PROBNP in the last 8760 hours. CBG: No results for input(s): GLUCAP in the last 168 hours.  Recent Results (from the past 240 hour(s))  TECHNOLOGIST REVIEW     Status: None   Collection Time: 02/13/14  9:16 AM  Result Value Ref Range Status   Technologist Review Rare Metas and Myelocytes present  Final  MRSA PCR Screening     Status: None   Collection Time: 02/19/14 12:52 AM  Result Value Ref Range Status   MRSA by PCR NEGATIVE NEGATIVE Final    Comment:        The GeneXpert MRSA Assay (FDA approved for NASAL specimens only), is one component of a comprehensive MRSA  colonization surveillance program. It is not intended to diagnose MRSA infection nor to guide or monitor treatment for MRSA infections.      Studies: No results found.  Scheduled Meds: . sodium chloride   Intravenous Once  . bismuth subsalicylate  30 mL Oral TID AC & HS  . dronabinol  5 mg Oral TID AC  . folic acid  1 mg Oral Daily  . potassium chloride  40 mEq Oral BID  . pravastatin  20 mg Oral q1800  . saccharomyces boulardii  250 mg Oral BID  . sodium chloride  3 mL Intravenous Q12H   Continuous Infusions: . 0.9 % NaCl with KCl 20 mEq / L 75 mL/hr at 02/19/14 0330     Charlynne Cousins  [facility] Pager [phone]. If 8PM-8AM, please contact night-coverage at [email or web address], password [facility] 02/19/2014, 7:55 AM  LOS: 1 day

## 2014-02-19 NOTE — Op Note (Signed)
[facility] [place], [address]   FLEXIBLE SIGMOIDOSCOPY PROCEDURE REPORT  PATIENT: [name], [name]  MR#: [id number] BIRTHDATE: Jan 26, 1956 , 54  yrs. old GENDER: male ENDOSCOPIST: Inda Castle, MD REFERRED BY: Burney Gauze, M.D. PROCEDURE DATE:  02/19/2014 PROCEDURE:   Sigmoidoscopy with control of bleeding and Sigmoidoscopy with ablation therapy ASA CLASS:   Class III INDICATIONS:hematochezia.   Colon Cancer. MEDICATIONS: Versed 4 mg IV and Fentanyl 50 mcg IV  DESCRIPTION OF PROCEDURE:   After the risks benefits and alternatives of the procedure were thoroughly explained, informed consent was obtained.  Digital exam revealed no abnormalities of the rectum. The     endoscope was introduced through the anus  and advanced to the    , The exam was Without limitations.    The quality of the prep was    .  The instrument was then slowly withdrawn as the mucosa was fully examined.         COLON FINDINGS: Beginning at approximately 16 cm and extending for least 5 cm there was an exophytic, diffusely bleeding tumor.  There are larger areas of ulceration.  Mucosa was nodular proximal to the tumor.  The descending colon was normal.  Her hematocrit fresh blood throughout the left colon.  The friable areas of the tumor were obliterated and cauterized utilizing the argon plasma coagulator.  At the conclusion of the procedure it was felt that the bleeding burden had decreased to cauterization.    Retroflexion was not performed.    The scope was then withdrawn from the patient and the procedure terminated.  COMPLICATIONS: There were no immediate complications.  ENDOSCOPIC IMPRESSION: Be  if you see friable, bleeding tumor - status post irritation and tissue ablation utilizing the argon plasma coagulator  RECOMMENDATIONS: Repeat treatment with APC as necessary  REPEAT EXAM:  eSigned:  Inda Castle, MD 02/19/2014 5:32 PM   CC:  PATIENT  NAME:  [name], [name] MR#: [id number]

## 2014-02-19 NOTE — Consult Note (Signed)
Consultation  Referring Provider: [facility] Primary Care Physician:  No primary care provider on file. Primary Gastroenterologist:  Dr.Brodie/Kaplan  Reason for Consultation:  Rectal bleeding  HPI: [name] is a 59 y.o. male who unfortunately was diagnosed with  obstructing  metastatic colon cancer in November 2015 at which time he was hospitalized at [facility]. He has multiple liver mets, ascites and multiple pulmonary nodules He had undergone an attempt at colonoscopy per Dr. Delfin Edis on 12/30/2013 after finding of a rectosigmoid mass on CT scan. He was found to have an obstructing mass 15 cm from the rectum. Unable to traverse with the colonoscope. Biopsies were taken and these were positive for adenocarcinoma. He then had flexible sigmoidoscopy on 01/01/2014 per Dr. Deatra Ina with finding of bleeding friable obstructing colonic mass in the sigmoid colon this was stented with a 22 by  9 wall flex stent and then dilated as the lumen was still tight. He was seen by oncology and is being followed by Dr. [name] and is undergoing chemotherapy with FOLFOX. His hemoglobin was 11.5 in mid December. His was repeated on 02/13/2014 and was 11.4. He has been having ongoing problems with diarrhea and intermittent small-volume bright red blood. He has been on a baby aspirin ,I believe because of a question of TIA's . He had onset of worsening diarrhea yesterday morning and then started passing large amounts of bright red blood. He presented to the emergency room late last evening and was admitted. He was hypotensive. Hemoglobin down to 8.6. He has now he had 2 units of packed RBCs.  He continues to ooze red blood from rectum.   Past Medical History  Diagnosis Date  . Hypertension   . Malignant neoplasm of colon 12/30/2013  . Cerebral embolism     Past Surgical History  Procedure Laterality Date  . Coronary artery bypass graft    . Flexible sigmoidoscopy N/A 12/30/2013    Procedure:  FLEXIBLE SIGMOIDOSCOPY;  Surgeon: Lafayette Dragon, MD;  Location: [facility] ENDOSCOPY;  Service: Endoscopy;  Laterality: N/A;  . Flexible sigmoidoscopy N/A 01/01/2014    Procedure: FLEXIBLE SIGMOIDOSCOPY;  Surgeon: Inda Castle, MD;  Location: [place];  Service: Endoscopy;  Laterality: N/A;  with stent placement  . Colonic stent placement N/A 01/01/2014    Procedure: COLONIC STENT PLACEMENT;  Surgeon: Inda Castle, MD;  Location: [place];  Service: Endoscopy;  Laterality: N/A;  . Portacath placement N/A 01/02/2014    Procedure: INSERTION PORT-A-CATH;  Surgeon: Ralene Ok, MD;  Location: [place];  Service: General;  Laterality: N/A;    Prior to Admission medications   Medication Sig Start Date End Date Taking? Authorizing Provider  amLODipine (NORVASC) 5 MG tablet Take 1 tablet (5 mg total) by mouth daily. 01/14/14  Yes Robbie Lis, MD  aspirin EC 325 MG EC tablet Take 1 tablet (325 mg total) by mouth daily. 01/14/14  Yes Robbie Lis, MD  bismuth subsalicylate (PEPTO BISMOL) 262 MG/15ML suspension Take 30 mLs by mouth 4 (four) times daily -  before meals and at bedtime. 01/14/14  Yes Robbie Lis, MD  diphenoxylate-atropine (LOMOTIL) 2.5-0.025 MG per tablet Take 2 tablets by mouth 4 (four) times daily. 01/14/14  Yes Robbie Lis, MD  dronabinol (MARINOL) 5 MG capsule Take 1 capsule (5 mg total) by mouth 3 (three) times daily before meals. 01/14/14  Yes Robbie Lis, MD  feeding supplement, RESOURCE BREEZE, (RESOURCE BREEZE) LIQD Take 1 Container by mouth  3 (three) times daily between meals. 01/14/14  Yes Robbie Lis, MD  folic acid (FOLVITE) 1 MG tablet Take 1 tablet (1 mg total) by mouth daily. 01/14/14  Yes Robbie Lis, MD  hydrALAZINE (APRESOLINE) 25 MG tablet Take 1 tablet (25 mg total) by mouth every 8 (eight) hours. 01/14/14  Yes Robbie Lis, MD  isosorbide dinitrate (ISORDIL) 40 MG tablet Take 1 tablet (40 mg total) by mouth 2 (two) times daily. 01/14/14  Yes Robbie Lis, MD    lidocaine-prilocaine (EMLA) cream Apply 1 application topically as needed. Apply to [facility] site 1 hour prior to appointment time. 01/22/14  Yes Eliezer Bottom, NP  metoprolol succinate (TOPROL-XL) 50 MG 24 hr tablet Take 1 tablet (50 mg total) by mouth daily. Take with or immediately following a meal. 01/14/14  Yes Robbie Lis, MD  oxyCODONE (OXY IR/ROXICODONE) 5 MG immediate release tablet Take 1 tablet (5 mg total) by mouth every 4 (four) hours as needed for severe pain. 01/14/14  Yes Robbie Lis, MD  potassium chloride SA (K-DUR,KLOR-CON) 20 MEQ tablet Take 1 tablet (20 mEq total) by mouth 2 (two) times daily. 01/14/14  Yes Robbie Lis, MD  pravastatin (PRAVACHOL) 20 MG tablet Take 1 tablet (20 mg total) by mouth daily at 6 PM. 01/14/14  Yes Robbie Lis, MD  saccharomyces boulardii (FLORASTOR) 250 MG capsule Take 1 capsule (250 mg total) by mouth 2 (two) times daily. 01/14/14  Yes Robbie Lis, MD  traZODone (DESYREL) 50 MG tablet Take 1 tablet (50 mg total) by mouth at bedtime as needed for sleep. 01/14/14  Yes Robbie Lis, MD    Current Facility-Administered Medications  Medication Dose Route Frequency Provider Last Rate Last Dose  . 0.9 %  sodium chloride infusion   Intravenous Once Mirna Mires, MD   0  at 02/19/14 2346  . 0.9 % NaCl with KCl 40 mEq / L  infusion   Intravenous Continuous Charlynne Cousins, MD      . acetaminophen (TYLENOL) tablet 650 mg  650 mg Oral Q6H PRN Berle Mull, MD       Or  . acetaminophen (TYLENOL) suppository 650 mg  650 mg Rectal Q6H PRN Berle Mull, MD      . bismuth subsalicylate (PEPTO BISMOL) 262 MG/15ML suspension 30 mL  30 mL Oral TID AC & HS Berle Mull, MD      . dronabinol (MARINOL) capsule 5 mg  5 mg Oral TID AC Berle Mull, MD   5 mg at 02/19/14 0813  . folic acid (FOLVITE) tablet 1 mg  1 mg Oral Daily Berle Mull, MD      . ondansetron (ZOFRAN) tablet 4 mg  4 mg Oral Q6H PRN Berle Mull, MD       Or  . ondansetron (ZOFRAN)  injection 4 mg  4 mg Intravenous Q6H PRN Berle Mull, MD      . oxyCODONE (Oxy IR/ROXICODONE) immediate release tablet 5 mg  5 mg Oral Q4H PRN Berle Mull, MD      . pravastatin (PRAVACHOL) tablet 20 mg  20 mg Oral q1800 Berle Mull, MD      . saccharomyces boulardii (FLORASTOR) capsule 250 mg  250 mg Oral BID Berle Mull, MD      . sodium chloride 0.9 % injection 3 mL  3 mL Intravenous Q12H Berle Mull, MD   3 mL at 02/19/14 0400  . traZODone (DESYREL) tablet 50 mg  50 mg Oral QHS PRN Berle Mull, MD        Allergies as of 02/18/2014 - Review Complete 02/18/2014  Allergen Reaction Noted  . Penicillins  12/27/2013    History reviewed. No pertinent family history.  History   Social History  . Marital Status: Married    Spouse Name: N/A    Number of Children: N/A  . Years of Education: N/A   Occupational History  . Not on file.   Social History Main Topics  . Smoking status: Never Smoker   . Smokeless tobacco: Never Used  . Alcohol Use: Yes  . Drug Use: Yes    Special: Marijuana  . Sexual Activity: Not on file   Other Topics Concern  . Not on file   Social History Narrative    Review of Systems: Pertinent positive and negative review of systems were noted in the above HPI section.  All other review of systems was otherwise negative. Physical Exam: Vital signs in last 24 hours: Temp:  [97.6 F (36.4 C)-98.4 F (36.9 C)] 97.9 F (36.6 C) (01/12 0910) Pulse Rate:  [70-88] 88 (01/12 0910) Resp:  [12-40] 40 (01/12 0910) BP: (89-130)/(54-91) 96/74 mmHg (01/12 0910) SpO2:  [96 %-100 %] 100 % (01/12 0910) Weight:  [146 lb 9.7 oz (66.5 kg)] 146 lb 9.7 oz (66.5 kg) (01/12 0000) Last BM Date: 02/19/14 General:   Alert,  Well-developed, AA male well-nourished, pleasant and cooperative in NAD Head:  Normocephalic and atraumatic. Eyes:  Sclera clear, no icterus.   Conjunctiva pink. Ears:  Normal auditory acuity. Nose:  No deformity, discharge,  or lesions. Mouth:  No  deformity or lesions.   Neck:  Supple; no masses or thyromegaly. Lungs:  Clear throughout to auscultation.   No wheezes, crackles, or rhonchi. Heart:  Regular rate and rhythm; no murmurs, clicks, rubs,  or gallops. Abdomen:  Soft,nontender, BS active, liver is quite large, nodular and tender Rectal; not done Msk:  Symmetrical without gross deformities. . Pulses:  Normal pulses noted. Extremities:  Without clubbing or edema. Neurologic:  Alert and  oriented x4;  grossly normal neurologically.speech somewhat slow but appropriate Skin:  Intact without significant lesions or rashes.. Psych:  Alert and cooperative. Normal mood and affect.  Intake/Output from previous day: 01/11 0701 - 01/12 0700 In: 112 [I.V.:10; Blood:102] Out: 526 [Urine:525; Stool:1] Intake/Output this shift: Total I/O In: 396.7 [I.V.:100; Blood:296.7] Out: -   Lab Results:  Recent Labs  02/18/14 2202  WBC 10.0  HGB 8.6*  HCT 25.8*  PLT 233   BMET  Recent Labs  02/18/14 2202  NA 133*  K 2.6*  CL 101  CO2 25  GLUCOSE 127*  BUN 18  CREATININE 1.09  CALCIUM 7.9*   LFT  Recent Labs  02/18/14 2202  PROT 6.2  ALBUMIN 2.6*  AST 33  ALT 14  ALKPHOS 191*  BILITOT 0.7   PT/INR  Recent Labs  02/18/14 2218  LABPROT 15.1  INR 1.18     IMPRESSION:  #1  59 yo AA male with  Obstructing  metastatic colon cancer to liver and lung. He was dx in 12/2013  , and underwent stenting of the obstructing tumor on 01/01/14. He has been having chemo /Folfox- now with acute lower GI bleeding very likely secondary to known obstructing friable colon cancer #2 Recent TIA's -on baby asa #3 anemia- secondary to blood loss   PLAN: #1 Support with transfusions as needed to keep hgb 8 #2 clear liquids #3  Will schedule for Flex with Dr. Deatra Ina this afternoon to see if can offer anything endoscopically. He may require IR consult for consideration of embolization if  continues to bleed.  Will follow Hold  Aspirin   Amy Savonburg  02/19/2014, 9:35 AM   GI Attending Note   Chart was reviewed and patient was examined. X-rays and lab were reviewed.    I agree with management and plans. Will proceed with sigmoidoscopy to see if there is a lesion amenable to endoscopic therapy.  If not and he continues to bleed will ask IR to evaluate for consideration of embolization Rx.  Sandy Salaam. Deatra Ina, M.D., [facility] Cell [phone]

## 2014-02-19 NOTE — Progress Notes (Signed)
Patient ID: [name], male   DOB: 10-May-1955, 59 y.o.   MRN: [id number]   Hgb down to 7.4 Passed a lot of blood and clots with enemas Will transfuse  2 more units of PRBC's

## 2014-02-19 NOTE — Progress Notes (Signed)
Dr. Olevia Bowens notified of low BP and that NS bolus initiated. Tiyon Sanor, Beverly Gust, RN

## 2014-02-20 ENCOUNTER — Encounter (HOSPITAL_COMMUNITY): Payer: Self-pay | Admitting: Gastroenterology

## 2014-02-20 ENCOUNTER — Inpatient Hospital Stay (HOSPITAL_COMMUNITY): Payer: Medicaid Other

## 2014-02-20 DIAGNOSIS — I950 Idiopathic hypotension: Secondary | ICD-10-CM

## 2014-02-20 DIAGNOSIS — R579 Shock, unspecified: Secondary | ICD-10-CM

## 2014-02-20 DIAGNOSIS — R0600 Dyspnea, unspecified: Secondary | ICD-10-CM

## 2014-02-20 LAB — TYPE AND SCREEN
ABO/RH(D): A POS
Antibody Screen: POSITIVE
DAT, IgG: NEGATIVE
UNIT DIVISION: 0
UNIT DIVISION: 0
Unit division: 0

## 2014-02-20 LAB — HEMOGLOBIN AND HEMATOCRIT, BLOOD
HCT: 19.9 % — ABNORMAL LOW (ref 39.0–52.0)
HEMATOCRIT: 22.5 % — AB (ref 39.0–52.0)
HEMATOCRIT: 21.9 % — AB (ref 39.0–52.0)
HEMOGLOBIN: 7.7 g/dL — AB (ref 13.0–17.0)
HEMOGLOBIN: 7.5 g/dL — AB (ref 13.0–17.0)
HEMOGLOBIN: 6.7 g/dL — AB (ref 13.0–17.0)

## 2014-02-20 LAB — BLOOD GAS, ARTERIAL
Acid-base deficit: 1.3 mmol/L (ref 0.0–2.0)
BICARBONATE: 21.8 meq/L (ref 20.0–24.0)
O2 CONTENT: 2 L/min
O2 Saturation: 97.3 %
PO2 ART: 83.4 mmHg (ref 80.0–100.0)
Patient temperature: 98.6
TCO2: 20.7 mmol/L (ref 0–100)
pCO2 arterial: 31.6 mmHg — ABNORMAL LOW (ref 35.0–45.0)
pH, Arterial: 7.454 — ABNORMAL HIGH (ref 7.350–7.450)

## 2014-02-20 LAB — URINALYSIS, ROUTINE W REFLEX MICROSCOPIC
Bilirubin Urine: NEGATIVE
GLUCOSE, UA: NEGATIVE mg/dL
Hgb urine dipstick: NEGATIVE
Ketones, ur: NEGATIVE mg/dL
Leukocytes, UA: NEGATIVE
NITRITE: NEGATIVE
Protein, ur: NEGATIVE mg/dL
SPECIFIC GRAVITY, URINE: 1.012 (ref 1.005–1.030)
Urobilinogen, UA: 1 mg/dL (ref 0.0–1.0)
pH: 5 (ref 5.0–8.0)

## 2014-02-20 LAB — PREPARE RBC (CROSSMATCH)

## 2014-02-20 MED ORDER — ENSURE COMPLETE PO LIQD
237.0000 mL | Freq: Two times a day (BID) | ORAL | Status: DC
  Administered 2014-02-20 – 2014-03-05 (×28): 237 mL via ORAL
  Filled 2014-02-20: qty 237

## 2014-02-20 MED ORDER — LEVOFLOXACIN IN D5W 750 MG/150ML IV SOLN
750.0000 mg | Freq: Every day | INTRAVENOUS | Status: DC
  Administered 2014-02-20 – 2014-02-21 (×2): 750 mg via INTRAVENOUS
  Filled 2014-02-20 (×4): qty 150

## 2014-02-20 MED ORDER — FUROSEMIDE 10 MG/ML IJ SOLN
20.0000 mg | Freq: Once | INTRAMUSCULAR | Status: AC
  Administered 2014-02-20: 20 mg via INTRAVENOUS
  Filled 2014-02-20: qty 2

## 2014-02-20 MED ORDER — PANTOPRAZOLE SODIUM 40 MG IV SOLR
40.0000 mg | Freq: Two times a day (BID) | INTRAVENOUS | Status: DC
  Administered 2014-02-20 – 2014-02-26 (×13): 40 mg via INTRAVENOUS
  Filled 2014-02-20 (×14): qty 40

## 2014-02-20 MED ORDER — SODIUM CHLORIDE 0.9 % IV SOLN
Freq: Once | INTRAVENOUS | Status: AC
  Administered 2014-02-20: 23:00:00 via INTRAVENOUS

## 2014-02-20 MED ORDER — SODIUM CHLORIDE 0.9 % IV SOLN
INTRAVENOUS | Status: DC
  Administered 2014-02-20: 999 mL/h via INTRAVENOUS

## 2014-02-20 NOTE — Progress Notes (Addendum)
Notified Dr. Charlies Silvers of BP, Hbg, and temperature.  Administered 500 ml NS bolus for hypotension.   Patient complained of shob after NS bolus.  Placed on [facility].   O2 sats remained 95-100%.  Notified Dr. Charlies Silvers of shob.  Lasix 20 mg IV ordered to be administered after transfusion of one unit of PRBC.

## 2014-02-20 NOTE — Progress Notes (Addendum)
Patient ID: [name], male   DOB: 03-Apr-1955, 59 y.o.   MRN: [id number] [facility] PROGRESS NOTE  [name] BWL:[id number] DOB: [birth year] DOA: 02/18/2014 PCP: No primary care provider on file.  Brief narrative:    59 y.o. male with a PMH of hypertension, dyslipidemia, history of CVA, aortic stenosis and repair of aortic aneurysm at [facility], recent hospitalization during which he was found to have sigmoid mass suspicious for colon cancer with liver metastasis, underwent chemotherapy with FOLFOX 01/04/2014 per Dr. [name], also found to have acute and subacute punctate cortical and subcortical stroke and was on aspirin. He presented to [facility] ED 02/18/2014 with bright red blood per rectum. GI was consulted and patient underwent flexible sigmoidoscopy with friable areas of the tumor obliterated and cauterized utilizing the argon plasma coagulator. Patient remains in step down unit to monitor for further bleed.   Assessment/Plan:    Principal Problem: Hemorrhagic shock / Lower GI bleed / acute blood loss anemia / bright red blood per rectum / anemia of chronic disease  Patient presented with bright red blood per rectum. His blood pressure was 61/11 on the admission, he was tachycardic and tachypneic.  Hemoglobin on admission was as low as 6.2.  He has received 2 units of PRBC transfusion.  Status post flexible sigmoidoscopy with friable areas of the tumor obliterated and cauterized.  Hemoglobin is 7.7 this morning. Continue to monitor for signs of bleed. Aspirin is on hold.  Appreciate the GI following.  Active Problems: Acute and subacute punctate cortical and subcortical stroke with dizziness and lightheadedness/cerebral embolism with cerebral infarction  Diagnosed 12/28/13 on MRI. Was on aspirin daily but this stopped due to bleeding.  Metastatic colon cancer / sigmoid colon mass and liver metastasis / elevated ALP / hepatomegaly / intestinal obstruction  Elevated LFTs felt to be  secondary to hepatic metastasis. Patient is status post sigmoidoscopy on recent admission with metal stenting to help with narrowing give risk for surgery and severe hepatomegaly--he isn't a surgical candidate.  S/P chemotherapy with FOLFOX 01/04/2014 per Dr. [name]. Port-A-Cath was placed 01/02/2014.  Appreciate palliative care consult and recommendations.  Hypokalemia  Likely from GI losses. Supplemented.  Potassium WNL.  Essential hypertension   All antihypertensives on hold due to hypotension   Dyslipidemia  Continue Pravachol 20 mg daily.  Severe protein calorie malnutrition/loss of weight  Continue feeding supplementation, Marinol TID.    DVT Prophylaxis   SCD's bilaterally   Code Status: Full.  Family Communication:  plan of care discussed with the patient Disposition Plan: remains in SDU due to potential bleed, hypotension   IV access:  Peripheral IV  Procedures and diagnostic studies:    No results found.  Medical Consultants:  Gastroenterology, Dr. Erskine Emery  Palliative care   Other Consultants:  None   IAnti-Infectives:   None    Cody Lenz, MD  [facility] Pager [phone]  If 7PM-7AM, please contact night-coverage [email or web address] Password [facility] 02/20/2014, 9:49 AM   LOS: 2 days    HPI/Subjective: No acute overnight events.  Objective: Filed Vitals:   02/20/14 0600 02/20/14 0700 02/20/14 0800 02/20/14 0855  BP: 101/72 87/72 84/57    Pulse:      Temp:   98.7 F (37.1 C)   TempSrc:   Oral   Resp: 18 20 21 30   Height:      Weight:      SpO2: 97% 100% 100% 100%    Intake/Output Summary (Last 24 hours) at 02/20/14 6203 Last data filed  at 02/20/14 0900  Gross per 24 hour  Intake 6108.33 ml  Output    875 ml  Net 5233.33 ml    Exam:   General:  Pt is alert, follows commands appropriately, not in acute distress  Cardiovascular: Regular rate and rhythm, S1/S2 (+); SEM +3/6 appreciated   Respiratory: Clear to  auscultation bilaterally, no wheezing  Abdomen: Soft, non tender, non distended, bowel sounds present  Extremities: Pulses DP and PT palpable bilaterally  Neuro: Grossly nonfocal  Data Reviewed: Basic Metabolic Panel:  Recent Labs Lab 02/18/14 2201 02/18/14 2202 02/19/14 1345  NA  --  133* 133*  K  --  2.6* 4.0  CL  --  101 104  CO2  --  25 22  GLUCOSE  --  127* 104*  BUN  --  18 19  CREATININE  --  1.09 1.17  CALCIUM  --  7.9* 7.4*  MG 1.9  --   --    Liver Function Tests:  Recent Labs Lab 02/18/14 2202 02/19/14 1345  AST 33 29  ALT 14 11  ALKPHOS 191* 141*  BILITOT 0.7 0.8  PROT 6.2 5.1*  ALBUMIN 2.6* 2.2*   No results for input(s): LIPASE, AMYLASE in the last 168 hours. No results for input(s): AMMONIA in the last 168 hours. CBC:  Recent Labs Lab 02/18/14 2202 02/19/14 1345 02/19/14 1828 02/20/14 0540  WBC 10.0 12.9*  --   --   NEUTROABS  --  9.2*  --   --   HGB 8.6* 7.4* 6.2* 7.7*  HCT 25.8* 21.7* 17.9* 22.5*  MCV 85.7 83.8  --   --   PLT 233 186  --   --    Cardiac Enzymes: No results for input(s): CKTOTAL, CKMB, CKMBINDEX, TROPONINI in the last 168 hours. BNP: Invalid input(s): POCBNP CBG: No results for input(s): GLUCAP in the last 168 hours.  Recent Results (from the past 240 hour(s))  TECHNOLOGIST REVIEW     Status: None   Collection Time: 02/13/14  9:16 AM  Result Value Ref Range Status   Technologist Review Rare Metas and Myelocytes present  Final  MRSA PCR Screening     Status: None   Collection Time: 02/19/14 12:52 AM  Result Value Ref Range Status   MRSA by PCR NEGATIVE NEGATIVE Final     Scheduled Meds: . dronabinol  5 mg Oral TID AC  . folic acid  1 mg Oral Daily  . pravastatin  20 mg Oral q1800  . saccharomyces boulardii  250 mg Oral BID   Continuous Infusions: . 0.9 % NaCl with KCl 40 mEq / L 100 mL/hr (02/20/14 0728)

## 2014-02-20 NOTE — Progress Notes (Signed)
ANTIBIOTIC CONSULT NOTE - INITIAL  Pharmacy Consult for Levofloxacin Indication: rule out pneumonia  Allergies  Allergen Reactions  . Penicillins     childhood   Patient Measurements: Height: 5\' 10"  (177.8 cm) Weight: 152 lb 1.9 oz (69 kg) IBW/kg (Calculated) : 73  Vital Signs: Temp: 98.7 F (37.1 C) (01/13 0800) Temp Source: Oral (01/13 0800) BP: 79/47 mmHg (01/13 1705) Intake/Output from previous day: 01/12 0701 - 01/13 0700 In: 9379 [P.O.:1750; I.V.:3563.3; Blood:1301.7] Out: 575 [Urine:575] Intake/Output from this shift: Total I/O In: 1610 [P.O.:1130; I.V.:480] Out: 1175 [Urine:1175]  Labs:  Recent Labs  02/18/14 2202 02/19/14 1345 02/19/14 1828 02/20/14 0540 02/20/14 1127  WBC 10.0 12.9*  --   --   --   HGB 8.6* 7.4* 6.2* 7.7* 7.5*  PLT 233 186  --   --   --   CREATININE 1.09 1.17  --   --   --    Estimated Creatinine Clearance: 67.2 mL/min (by C-G formula based on Cr of 1.17). No results for input(s): VANCOTROUGH, VANCOPEAK, VANCORANDOM, GENTTROUGH, GENTPEAK, GENTRANDOM, TOBRATROUGH, TOBRAPEAK, TOBRARND, AMIKACINPEAK, AMIKACINTROU, AMIKACIN in the last 72 hours.   Microbiology: Recent Results (from the past 720 hour(s))  TECHNOLOGIST REVIEW     Status: None   Collection Time: 02/13/14  9:16 AM  Result Value Ref Range Status   Technologist Review Rare Metas and Myelocytes present  Final  MRSA PCR Screening     Status: None   Collection Time: 02/19/14 12:52 AM  Result Value Ref Range Status   MRSA by PCR NEGATIVE NEGATIVE Final    Comment:        The GeneXpert MRSA Assay (FDA approved for NASAL specimens only), is one component of a comprehensive MRSA colonization surveillance program. It is not intended to diagnose MRSA infection nor to guide or monitor treatment for MRSA infections.    Medical History: Past Medical History  Diagnosis Date  . Hypertension   . Malignant neoplasm of colon 12/30/2013  . Cerebral embolism    Medications:   Scheduled:  . antiseptic oral rinse  7 mL Mouth Rinse q12n4p  . chlorhexidine  15 mL Mouth Rinse BID  . dronabinol  5 mg Oral TID AC  . feeding supplement (ENSURE COMPLETE)  237 mL Oral BID BM  . folic acid  1 mg Oral Daily  . pantoprazole (PROTONIX) IV  40 mg Intravenous Q12H  . pravastatin  20 mg Oral q1800  . saccharomyces boulardii  250 mg Oral BID  . sodium chloride  3 mL Intravenous Q12H   Anti-infectives    None     Assessment: 36 yoM admitted 1/11 with GI bleed, on ASA for previous CVA. Hx of metastatic colon Ca with colon stent, chemotherapy. Temp of 99, WBC 12.9K yesterday. Levaquin per Pharmacy  Renal function wnl  Goal of Therapy:  Antibiotic/dose/schedule appropriate for indication, renal function  Plan:   Levaquin 750mg  IV q24hr  Minda Ditto PharmD Pager [phone] 02/20/2014, 6:14 PM

## 2014-02-20 NOTE — Progress Notes (Signed)
Few events throughout the day: 1. Pt hypotensive with BP in 80's. Given bolus 500 cc and then pt short of breath. 2. Then pt with spike in fever of 101 F. Order placed for UA. UCx, blood culture, DG chest. 3. Started Levaquin empirically 4. Hemoglobin 6.7 so will given 1 Unit PRBC, give lasix 20 mg IV after transfusion, check Hgb after transfusion.  Leisa Lenz [facility] [phone]

## 2014-02-20 NOTE — Progress Notes (Signed)
Progress Note from the Palliative Medicine Team at [place] again today with [name]. He is feeling much better today and requesting Ensure. He is very pleasant but struggling with the reality that he has a terminal disease and he says "I know my days are numbered." We discussed the need to make the most of each day and keep him feeling as good as we can, sleeping well. He is very religious and trying to rely on his faith to stay positive but says he does often feel depressed. I encouraged and provided support to Mr. Utley.    Vinie Sill, NP Palliative Medicine Team Pager # [phone] (M-F 8a-5p) Team Phone # [phone] (Nights/Weekends)

## 2014-02-20 NOTE — Consult Note (Signed)
PULMONARY / CRITICAL CARE MEDICINE   Name: [name] MRN: [id number] DOB: Jun 25, 1955    ADMISSION DATE:  02/18/2014 CONSULTATION DATE:  02/20/2014  REFERRING MD :  Charlies Silvers  CHIEF COMPLAINT:  Respiratory distress  INITIAL PRESENTATION: 59 year old male with metastatic colon cancer presented to ED 1/11 c/o BRBPR x several hours. He was admitted for GI bleeding, underwent scope 1/12 which found bleeding colonic tumor, which was cauterized. 1/13 he has increased distress. PCCM asked to see.   STUDIES:    SIGNIFICANT EVENTS: 1/12 > flexible sigmoidoscopy with friable areas of the tumor obliterated and cauterized   HISTORY OF PRESENT ILLNESS:  59 year old male with PMH as below, which includes metastatic colon ca ( mets to liver, chemotherapy with FOLFOX 01/04/2014 per Dr. [name]), CVA, CABG, and AAA. 1/11 he presented to [facility] ED c/o several episodes of BRBPR. Also c/o dizziness. He was anemic on admission and was transfused PRBC. He was admitted to hospitalist team with GI consultation. He underwent flexible sigmoidoscopy 1/12 which found tumor with friable areas. They were obliterated and cauterized. GI felt the patient may need repeat treatment. Hypotension and anemia persisted despite these treatments. 1/13 patient with increased respiratory distress and persistent hypotension. PCCM to consult.  PAST MEDICAL HISTORY :   has a past medical history of Hypertension; Malignant neoplasm of colon (12/30/2013); and Cerebral embolism.  has past surgical history that includes Coronary artery bypass graft; Flexible sigmoidoscopy (N/A, 12/30/2013); Flexible sigmoidoscopy (N/A, 01/01/2014); colonic stent placement (N/A, 01/01/2014); Portacath placement (N/A, 01/02/2014); and Flexible sigmoidoscopy (N/A, 02/19/2014). Prior to Admission medications   Medication Sig Start Date End Date Taking? Authorizing Provider  amLODipine (NORVASC) 5 MG tablet Take 1 tablet (5 mg total) by mouth daily. 01/14/14  Yes Robbie Lis, MD  aspirin EC 325 MG EC tablet Take 1 tablet (325 mg total) by mouth daily. 01/14/14  Yes Robbie Lis, MD  bismuth subsalicylate (PEPTO BISMOL) 262 MG/15ML suspension Take 30 mLs by mouth 4 (four) times daily -  before meals and at bedtime. 01/14/14  Yes Robbie Lis, MD  diphenoxylate-atropine (LOMOTIL) 2.5-0.025 MG per tablet Take 2 tablets by mouth 4 (four) times daily. 01/14/14  Yes Robbie Lis, MD  dronabinol (MARINOL) 5 MG capsule Take 1 capsule (5 mg total) by mouth 3 (three) times daily before meals. 01/14/14  Yes Robbie Lis, MD  feeding supplement, RESOURCE BREEZE, (RESOURCE BREEZE) LIQD Take 1 Container by mouth 3 (three) times daily between meals. 01/14/14  Yes Robbie Lis, MD  folic acid (FOLVITE) 1 MG tablet Take 1 tablet (1 mg total) by mouth daily. 01/14/14  Yes Robbie Lis, MD  hydrALAZINE (APRESOLINE) 25 MG tablet Take 1 tablet (25 mg total) by mouth every 8 (eight) hours. 01/14/14  Yes Robbie Lis, MD  isosorbide dinitrate (ISORDIL) 40 MG tablet Take 1 tablet (40 mg total) by mouth 2 (two) times daily. 01/14/14  Yes Robbie Lis, MD  lidocaine-prilocaine (EMLA) cream Apply 1 application topically as needed. Apply to [facility] site 1 hour prior to appointment time. 01/22/14  Yes Eliezer Bottom, NP  metoprolol succinate (TOPROL-XL) 50 MG 24 hr tablet Take 1 tablet (50 mg total) by mouth daily. Take with or immediately following a meal. 01/14/14  Yes Robbie Lis, MD  oxyCODONE (OXY IR/ROXICODONE) 5 MG immediate release tablet Take 1 tablet (5 mg total) by mouth every 4 (four) hours as needed for severe pain. 01/14/14  Yes [place]  Vivi Ferns, MD  potassium chloride SA (K-DUR,KLOR-CON) 20 MEQ tablet Take 1 tablet (20 mEq total) by mouth 2 (two) times daily. 01/14/14  Yes Robbie Lis, MD  pravastatin (PRAVACHOL) 20 MG tablet Take 1 tablet (20 mg total) by mouth daily at 6 PM. 01/14/14  Yes Robbie Lis, MD  saccharomyces boulardii (FLORASTOR) 250 MG capsule Take 1 capsule (250 mg  total) by mouth 2 (two) times daily. 01/14/14  Yes Robbie Lis, MD  traZODone (DESYREL) 50 MG tablet Take 1 tablet (50 mg total) by mouth at bedtime as needed for sleep. 01/14/14  Yes Robbie Lis, MD   Allergies  Allergen Reactions  . Penicillins     childhood    FAMILY HISTORY:  has no family status information on file.  SOCIAL HISTORY:  reports that he has never smoked. He has never used smokeless tobacco. He reports that he drinks alcohol. He reports that he uses illicit drugs (Marijuana).  REVIEW OF SYSTEMS:   Bolds are positive  Constitutional: weight loss, gain, night sweats, Fevers, chills, fatigue .  HEENT: headaches, Sore throat, sneezing, nasal congestion, post nasal drip, Difficulty swallowing, Tooth/dental problems, visual complaints visual changes, ear ache CV:  chest pain, radiates: ,Orthopnea, PND, swelling in lower extremities, dizziness, palpitations, syncope.  GI  heartburn, indigestion, abdominal pain, nausea, vomiting, diarrhea, change in bowel habits, loss of appetite, bloody stools.  Resp: cough, productive: , hemoptysis, dyspnea, chest pain, pleuritic.  Skin: rash or itching or icterus GU: dysuria, change in color of urine, urgency or frequency. flank pain, hematuria  MS: joint pain or swelling. decreased range of motion  Psych: change in mood or affect. depression or anxiety.  Neuro: difficulty with speech, weakness, numbness, ataxia    SUBJECTIVE:   VITAL SIGNS: Temp:  [97.8 F (36.6 C)-102.6 F (39.2 C)] 102.6 F (39.2 C) (01/13 1940) Resp:  [17-38] 21 (01/13 2100) BP: (68-101)/(47-85) 93/60 mmHg (01/13 2100) SpO2:  [97 %-100 %] 100 % (01/13 2100) Weight:  [69 kg (152 lb 1.9 oz)] 69 kg (152 lb 1.9 oz) (01/13 0447) HEMODYNAMICS:   VENTILATOR SETTINGS:   INTAKE / OUTPUT:  Intake/Output Summary (Last 24 hours) at 02/20/14 2227 Last data filed at 02/20/14 2101  Gross per 24 hour  Intake   5168 ml  Output   2000 ml  Net   3168 ml    PHYSICAL  EXAMINATION: General:  Male of normal body habitus in NAD Neuro:  Alert, oriented, non-focal HEENT:  [place]/AT, no JVD noted Cardiovascular:  Tachy, regular. 4/6 SEM Lungs:  Scattered fine crackles Abdomen:  Soft, mildly distended, non-tender Musculoskeletal:  No acute deformity. Trace bilateral pedal edema Skin:  Grossly intact. Port-a-cath in place L anterior chest.   LABS:  CBC  Recent Labs Lab 02/18/14 2202 02/19/14 1345  02/20/14 0540 02/20/14 1127 02/20/14 1814  WBC 10.0 12.9*  --   --   --   --   HGB 8.6* 7.4*  < > 7.7* 7.5* 6.7*  HCT 25.8* 21.7*  < > 22.5* 21.9* 19.9*  PLT 233 186  --   --   --   --   < > = values in this interval not displayed. Coag's  Recent Labs Lab 02/18/14 2218 02/19/14 1345  INR 1.18 1.24   BMET  Recent Labs Lab 02/18/14 2202 02/19/14 1345  NA 133* 133*  K 2.6* 4.0  CL 101 104  CO2 25 22  BUN 18 19  CREATININE 1.09 1.17  GLUCOSE  127* 104*   Electrolytes  Recent Labs Lab 02/18/14 2201 02/18/14 2202 02/19/14 1345  CALCIUM  --  7.9* 7.4*  MG 1.9  --   --    Sepsis Markers No results for input(s): LATICACIDVEN, PROCALCITON, O2SATVEN in the last 168 hours. ABG No results for input(s): PHART, PCO2ART, PO2ART in the last 168 hours. Liver Enzymes  Recent Labs Lab 02/18/14 2202 02/19/14 1345  AST 33 29  ALT 14 11  ALKPHOS 191* 141*  BILITOT 0.7 0.8  ALBUMIN 2.6* 2.2*   Cardiac Enzymes No results for input(s): TROPONINI, PROBNP in the last 168 hours. Glucose No results for input(s): GLUCAP in the last 168 hours.  Imaging No results found.  CXR. 1/13 > Pulmonary vascular congestion. R sided aorta with stent in place. Bibasilar ATX  ASSESSMENT / PLAN:  PULMONARY A: Dyspnea Pulmonary Edema, concern for TACO - although has only received 2 units PRBC thus far Bibasilar atelectasis  ?CAP ABG encouraging, mild resp alkalosis. Doubt PE etiology, however, with cancer and absence of VTE chemoprophylaxis should remain  on differential.   P:   Supplemental O2 as needed to maintain SpO2 > 92% If becomes hypoxemic, would be good BiPAP candidate Diurese as BP tolerates, hope after blood can give lasix. Encourage aggressive IS/pulmonary hygiene.  Follow CXR Consider BLE dopplers/CTA if resp status does not improve with blood and lasix.  CARDIOVASCULAR CVL - [place] A:  Hypotension, likely hypovolemic H/o HTN,CAD s/p CABG, AAA repair  P:  Tele IVF resuscitation Blood products as below Trend H&H Hold home antihypertensives  RENAL A:   Hypokalemia (improved)  P:   Monitor Bmet and UOP Replete K as needed IVF with 40 meq of K  GASTROINTESTINAL A:   Metastatic colon Ca on chemo with bleeding tumor.  LGIB Mets to liver Elevated alk phos Nutrition  P:   NPO for now GI following, suggests that he may need repeated intervention. If continued deterioration, may need GI assistance overnight. IV pantoprazole  HEMATOLOGIC A:   Acute blood loss anemia 2nd to LGIB  P:  Administer 1 unit PRBC now Trend H&H SCDs No VTE chemoprophylaxis in setting of GIB  INFECTIOUS A:   SIRS - no obvious source Fevers, suspect 2nd to hypovolemia ? CAP - doubt  P:   Follow cultures Continue Levaquin for now, low threshold to DC Monitor clinically  ENDOCRINE A:   No acute issues   P:   Monitor  NEUROLOGIC A:   No acute issues  P:   RASS goal: 0 Monitor   FAMILY  - Updates:   - Inter-disciplinary family meet or Palliative Care meeting due by:  1/20   Georgann Housekeeper, AGACNP-BC [facility] Pager [phone] or [phone]

## 2014-02-20 NOTE — Progress Notes (Signed)
CRITICAL VALUE ALERT  Critical value received:  HGB 6.7  Date of notification:  02/20/2014  Time of notification:  1814  Critical value read back:  yes  Nurse who received alert:  Jerrel Ivory, RN  MD notified (1st page):  Dr. Neil Crouch  Time of first page:  1830  MD notified (2nd page):  Time of second page:  Responding MD:  Dr. Charlies Silvers  Time MD responded: [phone]

## 2014-02-20 NOTE — Progress Notes (Signed)
Patient ID: [name], male   DOB: 07-04-1955, 59 y.o.   MRN: [id number]  [facility] Progress Note  Subjective: No appetite, no change in abdominal pain- "I'm alive".. No further bleeding since procedure yesterday HGB 7.7 this am  Objective:  Vital signs in last 24 hours: Temp:  [97.2 F (36.2 C)-99 F (37.2 C)] 98.7 F (37.1 C) (01/13 0800) Pulse Rate:  [85-113] 104 (01/12 1806) Resp:  [11-38] 30 (01/13 0855) BP: (61-156)/(11-139) 84/57 mmHg (01/13 0800) SpO2:  [92 %-100 %] 100 % (01/13 0855) Weight:  [152 lb 1.9 oz (69 kg)] 152 lb 1.9 oz (69 kg) (01/13 0447) Last BM Date: 02/20/14 General:   Alert,  Well-developed,AA male     in NAD Heart:  Regular rate and rhythm; no murmurs Pulm;clear Abdomen:  Soft, nondistended. Normal bowel sounds, without guarding, and without rebound.   Extremities:  Without edema. Neurologic:  Alert and  oriented x4;  grossly normal neurologically. Psych:  Alert and cooperative. Normal mood and affect.  Intake/Output from previous day: 01/12 0701 - 01/13 0700 In: 6505 [P.O.:1750; I.V.:3453.3; Blood:1301.7] Out: 575 [Urine:575] Intake/Output this shift: Total I/O In: -  Out: 300 [Urine:300]  Lab Results:  Recent Labs  02/18/14 2202 02/19/14 1345 02/19/14 1828 02/20/14 0540  WBC 10.0 12.9*  --   --   HGB 8.6* 7.4* 6.2* 7.7*  HCT 25.8* 21.7* 17.9* 22.5*  PLT 233 186  --   --    BMET  Recent Labs  02/18/14 2202 02/19/14 1345  NA 133* 133*  K 2.6* 4.0  CL 101 104  CO2 25 22  GLUCOSE 127* 104*  BUN 18 19  CREATININE 1.09 1.17  CALCIUM 7.9* 7.4*   LFT  Recent Labs  02/19/14 1345  PROT 5.1*  ALBUMIN 2.2*  AST 29  ALT 11  ALKPHOS 141*  BILITOT 0.8   PT/INR  Recent Labs  02/18/14 2218 02/19/14 1345  LABPROT 15.1 15.7*  INR 1.18 1.24     Assessment / Plan: #1 59 yo male with metastatic colon cancer  To lung and liver with obstructing tumor in sigmoid colon. He is s/p colonic stent 12/2013 and  admitted with abrupt onset of large volume rectal bleeding. He is bleeding from tumor with large areas of ulceration Stable today s/p cauterization/APC of tumor yesterday  Advance diet as tolerates- no appetite Transfuse for hgb less than 7 Principal Problem:   BRBPR (bright red blood per rectum) Active Problems:   Cerebral embolism with cerebral infarction   Dizziness and giddiness   Benign essential HTN   Hypokalemia   Acute GI bleeding   Decreased appetite   Weakness generalized   Palliative care encounter     LOS: 2 days   Amy Esterwood  02/20/2014, 9:52 AM   GI Attending Note  I have personally taken an interval history, reviewed the chart, and examined the patient.  I agree with the extender's note, impression and recommendations.  No further hematochezia since treatment.  Will treat recurrent bleeding expectantly.  Sandy Salaam. Deatra Ina, MD, [facility] [phone]

## 2014-02-20 NOTE — Progress Notes (Signed)
[facility] Progress Note Patient Name: [name] DOB: [birth year] MRN: [id number]   Date of Service  02/20/2014  HPI/Events of Note  Worsening distress, tachy. PRBC still not given bc of ab  eICU Interventions  Will confact triad to asst bedside eval, get prbc ASAP likely needs lasix as edem a on prior pcxr Assess abg Will have pccm see as consult. Will d/w triad Repeat pcxr to guid lasix as some borderline BP     Intervention Category Major Interventions: Hemorrhage - evaluation and management  Shenna Brissette J. 02/20/2014, 10:07 PM

## 2014-02-21 ENCOUNTER — Inpatient Hospital Stay (HOSPITAL_COMMUNITY): Payer: Medicaid Other

## 2014-02-21 ENCOUNTER — Encounter (HOSPITAL_COMMUNITY): Payer: Self-pay | Admitting: Radiology

## 2014-02-21 DIAGNOSIS — I959 Hypotension, unspecified: Secondary | ICD-10-CM | POA: Insufficient documentation

## 2014-02-21 DIAGNOSIS — R5081 Fever presenting with conditions classified elsewhere: Secondary | ICD-10-CM

## 2014-02-21 DIAGNOSIS — R0603 Acute respiratory distress: Secondary | ICD-10-CM | POA: Insufficient documentation

## 2014-02-21 DIAGNOSIS — R0600 Dyspnea, unspecified: Secondary | ICD-10-CM | POA: Insufficient documentation

## 2014-02-21 LAB — URINE CULTURE
Colony Count: NO GROWTH
Culture: NO GROWTH

## 2014-02-21 LAB — LACTIC ACID, PLASMA
Lactic Acid, Venous: 1.2 mmol/L (ref 0.5–2.2)
Lactic Acid, Venous: 1.2 mmol/L (ref 0.5–2.2)

## 2014-02-21 LAB — BASIC METABOLIC PANEL
Anion gap: 6 (ref 5–15)
BUN: 14 mg/dL (ref 6–23)
CO2: 26 mmol/L (ref 19–32)
Calcium: 7.5 mg/dL — ABNORMAL LOW (ref 8.4–10.5)
Chloride: 101 mEq/L (ref 96–112)
Creatinine, Ser: 0.96 mg/dL (ref 0.50–1.35)
GFR calc Af Amer: >90 mL/min (ref >90)
GFR calc non Af Amer: 90 mL/min — ABNORMAL LOW (ref >90)
Glucose, Bld: 98 mg/dL (ref 70–99)
Potassium: 3.7 mmol/L (ref 3.5–5.1)
Sodium: 133 mmol/L — ABNORMAL LOW (ref 135–145)

## 2014-02-21 LAB — CBC
HEMATOCRIT: 23.8 % — AB (ref 39.0–52.0)
Hemoglobin: 8 g/dL — ABNORMAL LOW (ref 13.0–17.0)
MCH: 29.4 pg (ref 26.0–34.0)
MCHC: 33.6 g/dL (ref 30.0–36.0)
MCV: 87.5 fL (ref 78.0–100.0)
Platelets: 116 10*3/uL — ABNORMAL LOW (ref 150–400)
RBC: 2.72 MIL/uL — ABNORMAL LOW (ref 4.22–5.81)
RDW: 19.5 % — ABNORMAL HIGH (ref 11.5–15.5)
WBC: 22.5 10*3/uL — AB (ref 4.0–10.5)

## 2014-02-21 LAB — TROPONIN I: Troponin I: 0.73 ng/mL (ref <0.031)

## 2014-02-21 LAB — HEMOGLOBIN AND HEMATOCRIT, BLOOD
HCT: 24.2 % — ABNORMAL LOW (ref 39.0–52.0)
HCT: 24.1 % — ABNORMAL LOW (ref 39.0–52.0)
HEMATOCRIT: 23.1 % — AB (ref 39.0–52.0)
HEMOGLOBIN: 7.8 g/dL — AB (ref 13.0–17.0)
Hemoglobin: 8.3 g/dL — ABNORMAL LOW (ref 13.0–17.0)
Hemoglobin: 8.2 g/dL — ABNORMAL LOW (ref 13.0–17.0)

## 2014-02-21 LAB — BRAIN NATRIURETIC PEPTIDE: B NATRIURETIC PEPTIDE 5: 759.3 pg/mL — AB (ref 0.0–100.0)

## 2014-02-21 LAB — PROCALCITONIN: PROCALCITONIN: 1.19 ng/mL

## 2014-02-21 MED ORDER — BOOST PLUS PO LIQD
237.0000 mL | Freq: Three times a day (TID) | ORAL | Status: DC
  Administered 2014-02-21 – 2014-02-23 (×5): 237 mL via ORAL
  Filled 2014-02-21 (×17): qty 237

## 2014-02-21 MED ORDER — VANCOMYCIN HCL IN DEXTROSE 1-5 GM/200ML-% IV SOLN
1000.0000 mg | Freq: Two times a day (BID) | INTRAVENOUS | Status: DC
  Administered 2014-02-22 – 2014-02-24 (×5): 1000 mg via INTRAVENOUS
  Filled 2014-02-21 (×5): qty 200

## 2014-02-21 MED ORDER — VANCOMYCIN HCL 10 G IV SOLR
1500.0000 mg | Freq: Once | INTRAVENOUS | Status: AC
  Administered 2014-02-21: 1500 mg via INTRAVENOUS
  Filled 2014-02-21: qty 1500

## 2014-02-21 MED ORDER — SODIUM CHLORIDE 0.9 % IV BOLUS (SEPSIS)
1000.0000 mL | Freq: Once | INTRAVENOUS | Status: AC
  Administered 2014-02-21: 1000 mL via INTRAVENOUS

## 2014-02-21 MED ORDER — IOHEXOL 350 MG/ML SOLN
100.0000 mL | Freq: Once | INTRAVENOUS | Status: AC | PRN
  Administered 2014-02-21: 100 mL via INTRAVENOUS

## 2014-02-21 MED ORDER — SODIUM CHLORIDE 0.9 % IV BOLUS (SEPSIS)
500.0000 mL | Freq: Once | INTRAVENOUS | Status: AC
  Administered 2014-02-21: 500 mL via INTRAVENOUS

## 2014-02-21 NOTE — Progress Notes (Signed)
CRITICAL VALUE ALERT  Critical value received:  troponin  Date of notification:02/21/2014  Time of notification: 4327  Critical value read back:yes  Nurse who received alert: Clarene Duke RN  MD notified (1st page):  E link md  Time of first page: 1640  MD notified (2nd page):  Time of second page:  Responding MD:  Dr Melvyn Novas  Time MD [phone]

## 2014-02-21 NOTE — Progress Notes (Signed)
S:  Called by RN with positive blood cultures for GPC's in pairs  A/P: Vanco per pharmacy Consider ECHO pending species  Continue levaquin  Note PCN allergy    Cody Gens, NP-C [facility] Pgr: [phone] or [phone]

## 2014-02-21 NOTE — Procedures (Signed)
Central Venous Catheter Insertion Procedure Note [name] [id number] 12/22/1955  Procedure: Insertion of Central Venous Catheter Indications: Assessment of intravascular volume, Drug and/or fluid administration and Frequent blood sampling  Procedure Details Consent: Risks of procedure as well as the alternatives and risks of each were explained to the (patient/caregiver).  Consent for procedure obtained. Time Out: Verified patient identification, verified procedure, site/side was marked, verified correct patient position, special equipment/implants available, medications/allergies/relevent history reviewed, required imaging and test results available.  Performed  Maximum sterile technique was used including antiseptics, cap, gloves, gown, hand hygiene, mask and sheet. Skin prep: Chlorhexidine; local anesthetic administered A antimicrobial bonded/coated triple lumen catheter was placed in the left internal jugular vein using the Seldinger technique.  Evaluation Blood flow good Complications: No apparent complications Patient did tolerate procedure well. Chest X-ray ordered to verify placement.  CXR: pending.  Procedure performed under direct ultrasound guidance for real time vessel cannulation.      [name], [facility] Pgr: [phone]  or [phone] 02/21/2014, 6:57 PM

## 2014-02-21 NOTE — Progress Notes (Signed)
Patient ID: [name], male   DOB: 1956/01/25, 59 y.o.   MRN: [id number]  [facility] Progress Note  Subjective: Had SOB, hypotension last pm-hgb down to 6.7- has now received one unit  blood-hgb 8.0  This am. He feels better less SOB, denies chest pain.He has not had any bleeding Wants try to advance diet. CXR- mild interstitial edema Objective:  Vital signs in last 24 hours: Temp:  [98.3 F (36.8 C)-102.6 F (39.2 C)] 98.3 F (36.8 C) (01/14 0826) Resp:  [13-47] 19 (01/14 0900) BP: (68-117)/(46-92) 102/77 mmHg (01/14 0900) SpO2:  [92 %-100 %] 100 % (01/14 0900) Weight:  [163 lb 2.3 oz (74 kg)] 163 lb 2.3 oz (74 kg) (01/14 0452) Last BM Date: 02/20/14 General:   Alert,  Well-developed, AA male    in NAD Heart:   tachyRegular rate and rhythm;blowing loud syt murmur Pulm;clear ant Abdomen:  Soft, nontender and nondistended. Normal bowel sounds, without guarding, and without rebound.   Extremities:  Without edema. Neurologic:  Alert and  oriented x4;  grossly normal neurologically. Psych:  Alert and cooperative. Normal mood and affect.  Intake/Output from previous day: 01/13 0701 - 01/14 0700 In: 4726 [P.O.:1910; I.V.:2383; Blood:333; IV Piggyback:100] Out: 4680 [Urine:4025] Intake/Output this shift: Total I/O In: 440 [P.O.:240; I.V.:200] Out: 950 [Urine:950]  Lab Results:  Recent Labs  02/18/14 2202 02/19/14 1345  02/20/14 1127 02/20/14 1814 02/21/14 0430  WBC 10.0 12.9*  --   --   --  22.5*  HGB 8.6* 7.4*  < > 7.5* 6.7* 8.0*  HCT 25.8* 21.7*  < > 21.9* 19.9* 23.8*  PLT 233 186  --   --   --  116*  < > = values in this interval not displayed. BMET  Recent Labs  02/18/14 2202 02/19/14 1345 02/21/14 0430  NA 133* 133* 133*  K 2.6* 4.0 3.7  CL 101 104 101  CO2 25 22 26   GLUCOSE 127* 104* 98  BUN 18 19 14   CREATININE 1.09 1.17 0.96  CALCIUM 7.9* 7.4* 7.5*   LFT  Recent Labs  02/19/14 1345  PROT 5.1*  ALBUMIN 2.2*  AST 29  ALT 11   ALKPHOS 141*  BILITOT 0.8   PT/INR  Recent Labs  02/18/14 2218 02/19/14 1345  LABPROT 15.1 15.7*  INR 1.18 1.24      Assessment / Plan: #1  58 yo male with metastatic colon cancer-multiple liver mets, and probable lung mets. Sigmoid tumor is obstructing - he is s/p stent 12/2013- presented with GI bleeding from tumor. He underwent flex with APC/cauteryof tumor - stable from bleeding standpoint since- though this is a temporary fix, and high risk for recurrent bleeding #2 anemia- secondary to blood loss- would like to give him another unit but defer to CCM as being diuresed #3 Pulm Edema #4Recent TIA's #5 hypertensive cardiomyopathy  Principal Problem:   BRBPR (bright red blood per rectum) Active Problems:   Cerebral embolism with cerebral infarction   Dizziness and giddiness   Benign essential HTN   Hypokalemia   Acute GI bleeding   Decreased appetite   Weakness generalized   Palliative care encounter     LOS: 3 days   Amy Esterwood  02/21/2014, 10:16 AM   GI Attending Note  I have personally taken an interval history, reviewed the chart, and examined the patient.  I remain available to repeat laser therapy as needed for recurrent rectal bleeding.  Signing off  Sandy Salaam. Deatra Ina, MD, [facility]  [phone]     

## 2014-02-21 NOTE — Progress Notes (Signed)
Call received from lab reporting + blood cultures in 2 of 2 drawn on 02/20/2014 showing gm + cocci in pairs. Noe Gens NP informed. Neah Sporrer, Beverly Gust, RN

## 2014-02-21 NOTE — Progress Notes (Signed)
CRITICAL VALUE ALERT  Critical value received:  Troponin 0.73  Date of notification:  1.14.16  Time of notification:  2211  Critical value read back: Yes  Nurse who received alert:  Rito Ehrlich, RN  MD notified (1st page):  Dr. Melvyn Novas / Warren Lacy  Time of first page:  2211  MD notified (2nd page):  Time of second page:  Responding MD:  Dr. Melvyn Novas  Time MD responded:  2211

## 2014-02-21 NOTE — Progress Notes (Signed)
ANTIBIOTIC CONSULT NOTE - INITIAL  Pharmacy Consult for vancomycin Indication: bacteremia  Allergies  Allergen Reactions  . Penicillins     childhood    Patient Measurements: Height: 5\' 10"  (177.8 cm) Weight: 163 lb 2.3 oz (74 kg) IBW/kg (Calculated) : 73 Adjusted Body Weight:   Vital Signs: Temp: 98.3 F (36.8 C) (01/14 0826) Temp Source: Oral (01/14 0826) BP: 84/65 mmHg (01/14 1215) Intake/Output from previous day: 01/13 0701 - 01/14 0700 In: 4726 [P.O.:1910; I.V.:2383; Blood:333; IV Piggyback:100] Out: 8657 [Urine:4025] Intake/Output from this shift: Total I/O In: 8469 [P.O.:720; I.V.:500; IV Piggyback:500] Out: 950 [Urine:950]  Labs:  Recent Labs  02/18/14 2202 02/19/14 1345  02/20/14 1814 02/21/14 0430 02/21/14 1050  WBC 10.0 12.9*  --   --  22.5*  --   HGB 8.6* 7.4*  < > 6.7* 8.0* 8.3*  PLT 233 186  --   --  116*  --   CREATININE 1.09 1.17  --   --  0.96  --   < > = values in this interval not displayed. Estimated Creatinine Clearance: 86.6 mL/min (by C-G formula based on Cr of 0.96). No results for input(s): VANCOTROUGH, VANCOPEAK, VANCORANDOM, GENTTROUGH, GENTPEAK, GENTRANDOM, TOBRATROUGH, TOBRAPEAK, TOBRARND, AMIKACINPEAK, AMIKACINTROU, AMIKACIN in the last 72 hours.   Microbiology: Recent Results (from the past 720 hour(s))  TECHNOLOGIST REVIEW     Status: None   Collection Time: 02/13/14  9:16 AM  Result Value Ref Range Status   Technologist Review Rare Metas and Myelocytes present  Final  MRSA PCR Screening     Status: None   Collection Time: 02/19/14 12:52 AM  Result Value Ref Range Status   MRSA by PCR NEGATIVE NEGATIVE Final    Comment:        The GeneXpert MRSA Assay (FDA approved for NASAL specimens only), is one component of a comprehensive MRSA colonization surveillance program. It is not intended to diagnose MRSA infection nor to guide or monitor treatment for MRSA infections.   Culture, blood (routine x 2)     Status: None  (Preliminary result)   Collection Time: 02/20/14  5:42 PM  Result Value Ref Range Status   Specimen Description BLOOD RIGHT ARM  Final   Special Requests BOTTLES DRAWN AEROBIC AND ANAEROBIC 10CC EACH  Final   Culture   Final    GRAM POSITIVE COCCI IN PAIRS Note: Gram Stain Report Called to,Read Back By and Verified With: SUSAN@12 :50PM ON 02/21/14 BY DANTS Performed at [facility]    Report Status PENDING  Incomplete  Culture, blood (routine x 2)     Status: None (Preliminary result)   Collection Time: 02/20/14  6:40 PM  Result Value Ref Range Status   Specimen Description BLOOD RIGHT ARM  Final   Special Requests BOTTLES DRAWN AEROBIC AND ANAEROBIC 10CC EACH  Final   Culture   Final    GRAM POSITIVE COCCI IN PAIRS Note: Gram Stain Report Called to,Read Back By and Verified With: SUSAN@12 :50PM ON 02/21/14 BY DANTS Performed at [facility]    Report Status PENDING  Incomplete    Medical History: Past Medical History  Diagnosis Date  . Hypertension   . Malignant neoplasm of colon 12/30/2013  . Cerebral embolism    Assessment: 29 YOM presents with BRBPR, has h/o colon cancer with recent colonic stent. He is s/p cauterization of bleeding tumor by GI 1/12. Blood cultures from 1/13 reveal GPC pairs 2/2, orders to add vancomycin per pharmacy.   1/13 >> Levaquin >>  1/14 >> vancomycin >>  Tmax: 99.6 WBCs: elevated, worsening Renal: SCr WNL, normalized CrCl = 66ml/min  1/13 blood: GPC pairs 2/2 1/13 urine: collected   Goal of Therapy:  Vancomycin trough level 15-20 mcg/ml  Plan:   Vancomycin 1500mg  IV x 1 then 1gm IV q12h  Follow renal function  Check vancomycin trough if vancomycin to continue based on final culture results  Doreene Eland, PharmD, BCPS.   Pager: [phone]  02/21/2014,1:38 PM

## 2014-02-21 NOTE — Progress Notes (Signed)
Patient ID: [name], male   DOB: May 21, 1955, 59 y.o.   MRN: [id number] [facility] PROGRESS NOTE  [name] GEZ:[id number] DOB: 02-Sep-1955 DOA: 02/18/2014 PCP: No primary care provider on file.  Brief narrative:    59 y.o. male with a PMH of hypertension, dyslipidemia, history of CVA, aortic stenosis and repair of aortic aneurysm at [facility], recent hospitalization during which he was found to have sigmoid mass suspicious for colon cancer with liver metastasis, underwent chemotherapy with FOLFOX 01/04/2014 per Dr. [name], also found to have acute and subacute punctate cortical and subcortical stroke and was on aspirin. He presented to [facility] ED 02/18/2014 with bright red blood per rectum. GI was consulted and patient underwent flexible sigmoidoscopy with friable areas of the tumor obliterated and cauterized utilizing the argon plasma coagulator.   On 02/20/2014 patient spiked fever of 102.6 F. obtain blood cultures, chest x-ray, urinalysis and urine culture and started empiric Levaquin. No evidence of UTI based on urinalysis and no evidence of pneumonia based on chest x-ray. Blood cultures are pending.   Assessment/Plan:     Principal Problem: Hemorrhagic shock / Lower GI bleed / acute blood loss anemia / bright red blood per rectum / anemia of chronic disease  Patient presented with bright red blood per rectum. His blood pressure was 61/11 on the admission, he was tachycardic and tachypneic.  Hemoglobin on admission was as low as 6.2.  He has received 2 units of PRBC transfusion since admission  Status post flexible sigmoidoscopy 02/19/2014 with friable areas of the tumor obliterated and cauterized.  Hemoglobin is 8.0 this morning. Continue to monitor for signs of bleed. Aspirin is on hold.  Appreciate GI following.  Active Problems: Fever / hypotension /leukocytosis   Patient had transient hypotension, 68/51 which has responded to IV fluids and current blood pressure is 102/77. T max  in past 24 hours 102.6 F.  Started Levaquin empirically. Blood cultures are pending. Chest x-ray did not reveal pneumonia and urinalysis did not reveal urinary tract infection.   Acute and subacute punctate cortical and subcortical stroke with dizziness and lightheadedness/cerebral embolism with cerebral infarction  Diagnosed 12/28/13 on MRI. Was on aspirin daily but this was stopped on admission due to bleeding.  Metastatic colon cancer / sigmoid colon mass and liver metastasis / elevated ALP / hepatomegaly / intestinal obstruction  Elevated LFTs felt to be secondary to hepatic metastasis. Patient is status post sigmoidoscopy on recent admission with metal stenting to help with narrowing give risk for surgery and severe hepatomegaly--he isn't a surgical candidate.  S/P chemotherapy with FOLFOX 01/04/2014 per Dr. [name]. Port-A-Cath was placed 01/02/2014.  Appreciate palliative care consult and recommendations.  Hypokalemia  Likely from GI losses. Supplemented.  Potassium WNL.  Essential hypertension   All antihypertensives on hold due to hypotension   Dyslipidemia  Continue Pravachol 20 mg daily.  Severe protein calorie malnutrition/loss of weight  Continue feeding supplementation, Marinol TID.    DVT Prophylaxis   SCD's bilaterally   Code Status: Full.  Family Communication:  plan of care discussed with the patient Disposition Plan: remains in SDU due to potential bleed, hypotension   IV access:  Peripheral IV  Procedures and diagnostic studies:    Dg Chest Port 1 View 2014-03-01  1. Prior pleural catheter stable position. 2. Right-sided aorta with stent graft, stable in appearance. 3. Prior CABG. Stable cardiomegaly with stable mild pulmonary venous congestion and mild interstitial prominence suggesting mild interstitial edema.   Electronically Signed   By:  [place]   On: 02/21/2014 07:27   Dg Chest Port 1 View 02/20/2014  Vascular congestion and mild  cardiomegaly again noted, with minimal bilateral atelectasis.   Electronically Signed   By: Garald Balding M.D.   On: 02/20/2014 22:42   Dg Chest Port 1 View 02/20/2014  1. Bilateral mild interstitial thickening likely reflecting mild pulmonary vascular congestion. 2. Cardiomegaly with a right-sided aortic arch with an aortic stent graft present.   Electronically Signed   By: Kathreen Devoid   On: 02/20/2014 19:03   Medical Consultants:  Gastroenterology, Dr. Erskine Emery  Critical care medicine Palliative care   Other Consultants:  None   IAnti-Infectives:   Levaquin 02/21/2014 -->   Leisa Lenz, MD  [facility] Pager [phone]  If 7PM-7AM, please contact night-coverage [email or web address] Password [email or web address] 02/21/2014, 10:10 AM   LOS: 3 days    HPI/Subjective: No acute overnight events.  Objective: Filed Vitals:   02/21/14 0630 02/21/14 0800 02/21/14 0826 02/21/14 0900  BP:  110/83  102/77  Pulse:      Temp:   98.3 F (36.8 C)   TempSrc:   Oral   Resp: 13 15  19   Height:      Weight:      SpO2: 100% 99%  100%    Intake/Output Summary (Last 24 hours) at 02/21/14 1010 Last data filed at 02/21/14 1003  Gross per 24 hour  Intake   4936 ml  Output   4675 ml  Net    261 ml    Exam:   General:  Pt is alert, follows commands appropriately, not in acute distress  Cardiovascular: Regular rate and rhythm, S1/S2 (+)  Respiratory: no wheezing, no crackles, no rhonchi  Abdomen: Soft, non tender, non distended, bowel sounds present  Extremities: pulses DP and PT palpable bilaterally  Neuro: Grossly nonfocal  Data Reviewed: Basic Metabolic Panel:  Recent Labs Lab 02/18/14 2201 02/18/14 2202 02/19/14 1345 02/21/14 0430  NA  --  133* 133* 133*  K  --  2.6* 4.0 3.7  CL  --  101 104 101  CO2  --  25 22 26   GLUCOSE  --  127* 104* 98  BUN  --  18 19 14   CREATININE  --  1.09 1.17 0.96  CALCIUM  --  7.9* 7.4* 7.5*  MG 1.9  --   --   --    Liver Function  Tests:  Recent Labs Lab 02/18/14 2202 02/19/14 1345  AST 33 29  ALT 14 11  ALKPHOS 191* 141*  BILITOT 0.7 0.8  PROT 6.2 5.1*  ALBUMIN 2.6* 2.2*   No results for input(s): LIPASE, AMYLASE in the last 168 hours. No results for input(s): AMMONIA in the last 168 hours. CBC:  Recent Labs Lab 02/18/14 2202 02/19/14 1345 02/19/14 1828 02/20/14 0540 02/20/14 1127 02/20/14 1814 02/21/14 0430  WBC 10.0 12.9*  --   --   --   --  22.5*  NEUTROABS  --  9.2*  --   --   --   --   --   HGB 8.6* 7.4* 6.2* 7.7* 7.5* 6.7* 8.0*  HCT 25.8* 21.7* 17.9* 22.5* 21.9* 19.9* 23.8*  MCV 85.7 83.8  --   --   --   --  87.5  PLT 233 186  --   --   --   --  116*   Cardiac Enzymes: No results for input(s): CKTOTAL, CKMB, CKMBINDEX, TROPONINI in the last 168 hours.  BNP: Invalid input(s): POCBNP CBG: No results for input(s): GLUCAP in the last 168 hours.  Recent Results (from the past 240 hour(s))  TECHNOLOGIST REVIEW     Status: None   Collection Time: 02/13/14  9:16 AM  Result Value Ref Range Status   Technologist Review Rare Metas and Myelocytes present  Final  MRSA PCR Screening     Status: None   Collection Time: 02/19/14 12:52 AM  Result Value Ref Range Status   MRSA by PCR NEGATIVE NEGATIVE Final     Scheduled Meds: . antiseptic oral rinse  7 mL Mouth Rinse q12n4p  . chlorhexidine  15 mL Mouth Rinse BID  . dronabinol  5 mg Oral TID AC  . feeding supplement (ENSURE COMPLETE)  237 mL Oral BID BM  . folic acid  1 mg Oral Daily  . levofloxacin (LEVAQUIN) IV  750 mg Intravenous q1800  . pantoprazole (PROTONIX) IV  40 mg Intravenous Q12H  . pravastatin  20 mg Oral q1800  . saccharomyces boulardii  250 mg Oral BID  . sodium chloride  3 mL Intravenous Q12H   Continuous Infusions: . sodium chloride 999 mL/hr (02/20/14 1718)  . 0.9 % NaCl with KCl 40 mEq / L 100 mL/hr (02/21/14 1003)

## 2014-02-21 NOTE — Progress Notes (Signed)
Mr. Uhrich appears to be sleeping when I came by today - appears he had an eventful night last night with shortness of breath and testing. He does not appear in any distress at this point. RN is following hypotension closely. Blood culture results noted. I will not awaken him and I will follow up with him tomorrow for continue support.   Vinie Sill, NP Palliative Medicine Team Pager # [phone] (M-F 8a-5p) Team Phone # [phone] (Nights/Weekends)

## 2014-02-21 NOTE — Progress Notes (Signed)
Name: [name] MRN: [id number] DOB: April 02, 1955  ELECTRONIC ICU PHYSICIAN NOTE  Problem:  Hypotension   Intake/Output Summary (Last 24 hours) at 02/21/14 1803 Last data filed at 02/21/14 1600  Gross per 24 hour  Intake   5136 ml  Output   4375 ml  Net    761 ml     Lab Results  Component Value Date   HGB 8.2* 02/21/2014   HGB 8.3* 02/21/2014   HGB 8.0* 02/21/2014     Intervention:  One liter bolus/ needs cvp next   Christinia Gully 02/21/2014, 6:02 PM

## 2014-02-22 DIAGNOSIS — I059 Rheumatic mitral valve disease, unspecified: Secondary | ICD-10-CM

## 2014-02-22 LAB — BASIC METABOLIC PANEL
Anion gap: 3 — ABNORMAL LOW (ref 5–15)
BUN: 15 mg/dL (ref 6–23)
CALCIUM: 7.2 mg/dL — AB (ref 8.4–10.5)
CO2: 26 mmol/L (ref 19–32)
Chloride: 102 mEq/L (ref 96–112)
Creatinine, Ser: 0.84 mg/dL (ref 0.50–1.35)
GFR calc Af Amer: >90 mL/min (ref >90)
GFR calc non Af Amer: >90 mL/min (ref >90)
Glucose, Bld: 114 mg/dL — ABNORMAL HIGH (ref 70–99)
POTASSIUM: 4.3 mmol/L (ref 3.5–5.1)
SODIUM: 131 mmol/L — AB (ref 135–145)

## 2014-02-22 LAB — HEMOGLOBIN AND HEMATOCRIT, BLOOD
HCT: 23.9 % — ABNORMAL LOW (ref 39.0–52.0)
HCT: 27.3 % — ABNORMAL LOW (ref 39.0–52.0)
HEMATOCRIT: 23.8 % — AB (ref 39.0–52.0)
HEMOGLOBIN: 7.9 g/dL — AB (ref 13.0–17.0)
Hemoglobin: 8 g/dL — ABNORMAL LOW (ref 13.0–17.0)
Hemoglobin: 8.9 g/dL — ABNORMAL LOW (ref 13.0–17.0)

## 2014-02-22 LAB — PROCALCITONIN: Procalcitonin: 0.99 ng/mL

## 2014-02-22 LAB — CBC
HEMATOCRIT: 23.4 % — AB (ref 39.0–52.0)
Hemoglobin: 7.7 g/dL — ABNORMAL LOW (ref 13.0–17.0)
MCH: 29.4 pg (ref 26.0–34.0)
MCHC: 32.9 g/dL (ref 30.0–36.0)
MCV: 89.3 fL (ref 78.0–100.0)
PLATELETS: 118 10*3/uL — AB (ref 150–400)
RBC: 2.62 MIL/uL — AB (ref 4.22–5.81)
RDW: 19.4 % — ABNORMAL HIGH (ref 11.5–15.5)
WBC: 16.9 10*3/uL — AB (ref 4.0–10.5)

## 2014-02-22 LAB — TROPONIN I: Troponin I: 0.51 ng/mL (ref <0.031)

## 2014-02-22 LAB — MAGNESIUM: Magnesium: 1.3 mg/dL — ABNORMAL LOW (ref 1.5–2.5)

## 2014-02-22 LAB — PHOSPHORUS: Phosphorus: 1.8 mg/dL — ABNORMAL LOW (ref 2.3–4.6)

## 2014-02-22 LAB — PREPARE RBC (CROSSMATCH)

## 2014-02-22 MED ORDER — SODIUM CHLORIDE 0.9 % IV SOLN
Freq: Once | INTRAVENOUS | Status: AC
  Administered 2014-02-22: 13:00:00 via INTRAVENOUS

## 2014-02-22 MED ORDER — MAGNESIUM SULFATE 4 GM/100ML IV SOLN
4.0000 g | Freq: Once | INTRAVENOUS | Status: AC
  Administered 2014-02-22: 4 g via INTRAVENOUS
  Filled 2014-02-22: qty 100

## 2014-02-22 MED ORDER — POTASSIUM PHOSPHATES 15 MMOLE/5ML IV SOLN
24.0000 mmol | Freq: Once | INTRAVENOUS | Status: AC
  Administered 2014-02-22: 24 mmol via INTRAVENOUS
  Filled 2014-02-22: qty 8

## 2014-02-22 MED ORDER — FUROSEMIDE 10 MG/ML IJ SOLN
40.0000 mg | Freq: Once | INTRAMUSCULAR | Status: AC
  Administered 2014-02-22: 40 mg via INTRAVENOUS
  Filled 2014-02-22: qty 4

## 2014-02-22 NOTE — Progress Notes (Signed)
PULMONARY / CRITICAL CARE MEDICINE   Name: [name] MRN: [id number] DOB: March 11, 1955    ADMISSION DATE:  02/18/2014 CONSULTATION DATE:  02/20/2014  REFERRING MD :  Charlies Silvers  CHIEF COMPLAINT:  Respiratory distress  INITIAL PRESENTATION: 59 y/o M with metastatic colon cancer who presented to ED 1/11 c/o BRBPR x several hours. He was admitted for GI bleeding, underwent scope 1/12 which found bleeding colonic tumor, which was cauterized. 1/13 he has increased distress / hypotension. PCCM asked to see.   STUDIES:  CTA 1/14 >> neg for PE, R sided aortic arch, 58m nodule in lingula, dependent atelectasis  SIGNIFICANT EVENTS: 1/12  Flexible sigmoidoscopy with friable areas of the tumor obliterated and cauterized 1/15  VSS, Hgb stable    SUBJECTIVE: Pt denies acute complaints, reports feeling better.  Less SOB, denies pain.  RN reports no bloody stools.   VITAL SIGNS: Temp:  [97.7 F (36.5 C)-99.4 F (37.4 C)] 98.9 F (37.2 C) (01/15 0800) Resp:  [10-41] 29 (01/15 0900) BP: (74-126)/(49-98) 126/85 mmHg (01/15 0900) SpO2:  [97 %-100 %] 99 % (01/15 0900) Weight:  [167 lb 1.7 oz (75.8 kg)] 167 lb 1.7 oz (75.8 kg) (01/15 0500)  HEMODYNAMICS: CVP:  [3 mmHg-7 mmHg] 3 mmHg  INTAKE / OUTPUT:  Intake/Output Summary (Last 24 hours) at 02/22/14 1019 Last data filed at 02/22/14 0920  Gross per 24 hour  Intake   6490 ml  Output   3900 ml  Net   2590 ml    PHYSICAL EXAMINATION: General:  WDWN adult male in NAD Neuro:  Alert, oriented, non-focal HEENT:  [place]/AT, no JVD noted Cardiovascular:  S1S2 rrr, 4/6 SEM Lungs:  resp's even/non-labored, lungs bilaterally clear Abdomen:  Soft, non-tender Musculoskeletal:  No acute deformity. Trace bilateral pedal edema Skin:  Grossly intact. Port-a-cath in place L anterior chest.   LABS:  CBC  Recent Labs Lab 02/19/14 1345  02/21/14 0430  02/22/14 0001 02/22/14 0418 02/22/14 0840  WBC 12.9*  --  22.5*  --   --  16.9*  --   HGB 7.4*  < >  8.0*  < > 7.9* 7.7* 8.0*  HCT 21.7*  < > 23.8*  < > 23.9* 23.4* 23.8*  PLT 186  --  116*  --   --  118*  --   < > = values in this interval not displayed.   Coag's  Recent Labs Lab 02/18/14 2218 02/19/14 1345  INR 1.18 1.24   BMET  Recent Labs Lab 02/19/14 1345 02/21/14 0430 02/22/14 0418  NA 133* 133* 131*  K 4.0 3.7 4.3  CL 104 101 102  CO2 _0 BUN _1 CREATININE 1.17 0.96 0.84  GLUCOSE 104* 98 114*   Electrolytes  Recent Labs Lab 02/18/14 2201  02/19/14 1345 02/21/14 0430 02/22/14 0418  CALCIUM  --   < > 7.4* 7.5* 7.2*  MG 1.9  --   --   --  1.3*  PHOS  --   --   --   --  1.8*  < > = values in this interval not displayed.   Sepsis Markers  Recent Labs Lab 02/21/14 0422 02/21/14 1324 02/22/14 0418  LATICACIDVEN 1.2 1.2  --   PROCALCITON  --  1.19 0.99   ABG  Recent Labs Lab 02/20/14 2215  PHART 7.454*  PCO2ART 31.6*  PO2ART 83.4   Liver Enzymes  Recent Labs Lab 02/18/14 2202 02/19/14 1345  AST 33 29  ALT 14 11  ALKPHOS 191* 141*  BILITOT 0.7 0.8  ALBUMIN 2.6* 2.2*   Cardiac Enzymes  Recent Labs Lab 02/21/14 1218 02/21/14 2020 02/22/14 0418  TROPONINI CRITICAL RESULT CALLED TO, READ BACK BY AND VERIFIED WITH: 0.73* 0.51*   Glucose No results for input(s): GLUCAP in the last 168 hours.  Imaging Dg Chest 1 View  02/21/2014   CLINICAL DATA:  Central line placement.  EXAM: CHEST - 1 VIEW  COMPARISON:  02/21/2014; 02/20/2014; chest CTA - 02/21/2014  FINDINGS: Grossly unchanged enlarged cardiac silhouette and mediastinal contours post median sternotomy and CABG. Similar findings of a right-sided aortic arch and the sequela of prior endovascular stent graft repair of the thoracic aorta. Interval placement of a new left jugular approach central venous catheter whose tip projects over the thoracic aortic stent graft. No pneumothorax. No new focal airspace opacities. No pleural effusion. No definite evidence of edema. Unchanged  bones.  IMPRESSION: 1. Interval placement of a new left side approach central catheter. The tip of the new central venous catheter overlies the right-sided aortic arch stent graft and while courses along the patient's left anterior chest wall Port a catheter tubing (which was noted to be appropriately positioned on recent performed chest CTA), the exact location of the tip of the new left sided central line indeterminate on this examination and correlation with blood gas and/or waveforms is recommended. No pneumothorax. 2. Cardiomegaly without acute cardiopulmonary disease. These results will be called to the ordering clinician or representative by the Radiologist Assistant, and communication documented in the PACS or zVision Dashboard.   Electronically Signed   By: Sandi Mariscal M.D.   On: 02/21/2014 19:45   Ct Angio Chest Pe W/cm &/or Wo Cm  02/21/2014   CLINICAL DATA:  Dyspnea and respiratory distress.  EXAM: CT ANGIOGRAPHY CHEST WITH CONTRAST  TECHNIQUE: Multidetector CT imaging of the chest was performed using the standard protocol during bolus administration of intravenous contrast. Multiplanar CT image reconstructions and MIPs were obtained to evaluate the vascular anatomy.  CONTRAST:  116m OMNIPAQUE IOHEXOL 350 MG/ML SOLN  COMPARISON:  One-view chest x-ray 02/21/2014.  CT chest 12/31/2013.  FINDINGS: Pulmonary arterial opacification is satisfactory. The study is mildly degraded by breathing motion in the inferior lower lobes. No focal filling defect is present to suggest a pulmonary embolus.  A study graft is present within the right sided aortic arch.  The  The heart is mildly enlarged. Coronary artery calcifications are present. The patient is status post CABG.  Multiple hepatic lesions are again noted.  Cirrhosis is evident.  The lung windows demonstrate stable appearance of a 4 mm nodule within the lingula. A high-density granuloma is present posteriorly in the right lower lobe. Dependent atelectasis  has progressed bilaterally. No new nodule, mass, or airspace disease is present otherwise.  The bone windows demonstrate no focal lytic or blastic lesions.  Review of the MIP images confirms the above findings.  IMPRESSION: 1. No evidence for pulmonary embolus. The study is mildly degraded by patient breathing motion at the lung bases bilaterally. 2. Right-sided aortic arch. 3. An aortic stent graft is in place. 4. Stable 4 mm nodule within the lingula. 5. Mild increase in dependent atelectasis.   Electronically Signed   By: CLawrence SantiagoM.D.   On: 02/21/2014 14:31   Dg Chest Port 1 View  02/21/2014   CLINICAL DATA:  Edema.  EXAM: PORTABLE CHEST - 1 VIEW  COMPARISON:  02/20/2014.  FINDINGS: Power port catheter in stable position. Mediastinum  and hilar structures are stable. Right-sided aorta with aortic stent graft, stable in appearance. Stable cardiomegaly with stable mild pulmonary vascular and interstitial prominence. Mild CHF cannot be excluded. No pleural effusion or pneumothorax.  IMPRESSION: 1. Prior pleural catheter stable position. 2. Right-sided aorta with stent graft, stable in appearance. 3. Prior CABG. Stable cardiomegaly with stable mild pulmonary venous congestion and mild interstitial prominence suggesting mild interstitial edema.   Electronically Signed   By: Marcello Moores  Register   On: 02/21/2014 07:27     ASSESSMENT / PLAN:  PULMONARY A: Dyspnea - ABG w/ mild resp alkalosis. CTA neg for PE 1/14. Resolved 1/15.  Pulmonary Edema - concern for TACO Bibasilar atelectasis  ? CAP P:   Supplemental O2 as needed to maintain SpO2 > 92% PRN BiPAP for increased WOB Encourage aggressive IS/pulmonary hygiene.  Follow CXR Consider lasix as renal fxn / BP permit   CARDIOVASCULAR CVL - Port L IJ A:  Hypotension, likely hypovolemic.  Resolved.  Elevated Troponin - concern for demand ischemia  H/o HTN,CAD s/p CABG, AAA repair R Sided Aortic Arch P:  Tele Trend H&H Hold home  antihypertensives Assess EKG Await ECHO   RENAL A:   Hypokalemia (improved) P:   Trend BMP  Monitor I/O's Replace electrolytes as indicated IVF with 40 meq of K @ 100 ml/hr  GASTROINTESTINAL A:   Metastatic colon CA on chemo with bleeding tumor.  LGIB Mets to liver Elevated alk phos Nutrition P:   Soft diet Appreciate GI input IV pantoprazole BID   HEMATOLOGIC A:   Acute blood loss anemia 2nd to LGIB P: PRBC per TRH - ok with slight bump in troponin, goal > 8 Trend H&H SCDs No VTE chemoprophylaxis in setting of GIB   INFECTIOUS A:   SIRS - no obvious source infection Fevers, suspect 2nd to hypovolemia ? CAP - doubt, no evidence to support P:   Follow cultures Monitor clinically Levaquin D3/x, d/c 1/15    ENDOCRINE A:   No acute issues P:   Monitor  NEUROLOGIC A:   No acute issues P:   RASS goal: 0 Monitor   FAMILY  - Updates: Patient updated 1/15.    - Inter-disciplinary family meet or Palliative Care meeting due by:  1/20   PCCM will sign off.  Please call back if new needs arise.   Noe Gens, NP-C [facility] Pgr: [phone] or [phone]

## 2014-02-22 NOTE — Progress Notes (Signed)
[facility] PROGRESS NOTE  Rashaad Hallstrom ZYS:[id number] DOB: [birth year] DOA: 02/18/2014 PCP: No primary care provider on file.  Assessment/Plan:  Principal Problem:   Hypotension - Most likely due to intravascular depletion. Has resolved with IVF rehydration. - Will continue to monitor - Pt had central line placed    BRBPR (bright red blood per rectum) - Plan on transfusing 1 unit of PRBC given history of recent GI bleed  Active Problems:   Cerebral embolism with cerebral infarction - stable no new neurological deficits reported. - aspirin on hold due to GI bleed.    Hypokalemia - resolved    Acute GI bleeding - GI on board and patient is s/p flexible sigmoidoscopy with friable areas of the tumor obliterated and cauterized utilizing the argon plasma coagulator - aspirin on hold    Decreased appetite - Currently on Boost and on bland diet.    Weakness generalized - Once hypotension fully resolved will plan on having PT evaluate and work with patient.    Palliative care encounter - Currently on board and will assist with planning  Code Status:  Family Communication:  Disposition Plan:    Consultants:  GI  Palliative  Procedures:  As listed above  Antibiotics:  Levaquin  Vancomycin  HPI/Subjective: Pt has no new complaints. No acute issues reported overnight.  Objective: Filed Vitals:   02/22/14 0900  BP: 126/85  Pulse:   Temp:   Resp: 29    Intake/Output Summary (Last 24 hours) at 02/22/14 0937 Last data filed at 02/22/14 0920  Gross per 24 hour  Intake   6830 ml  Output   4150 ml  Net   2680 ml   Filed Weights   02/20/14 0447 02/21/14 0452 02/22/14 0500  Weight: 69 kg (152 lb 1.9 oz) 74 kg (163 lb 2.3 oz) 75.8 kg (167 lb 1.7 oz)    Exam:   General:  Pt in NAD, alert and awake  Cardiovascular: rrr, no murmurs  Respiratory: cta bl, no wheezes  Abdomen: + bowel sounds, soft, NT  Musculoskeletal: no cyanosis or clubbing    Data Reviewed: Basic Metabolic Panel:  Recent Labs Lab 02/18/14 2201 02/18/14 2202 02/19/14 1345 02/21/14 0430 02/22/14 0418  NA  --  133* 133* 133* 131*  K  --  2.6* 4.0 3.7 4.3  CL  --  101 104 101 102  CO2  --  25 22 26 26   GLUCOSE  --  127* 104* 98 114*  BUN  --  18 19 14 15   CREATININE  --  1.09 1.17 0.96 0.84  CALCIUM  --  7.9* 7.4* 7.5* 7.2*  MG 1.9  --   --   --  1.3*  PHOS  --   --   --   --  1.8*   Liver Function Tests:  Recent Labs Lab 02/18/14 2202 02/19/14 1345  AST 33 29  ALT 14 11  ALKPHOS 191* 141*  BILITOT 0.7 0.8  PROT 6.2 5.1*  ALBUMIN 2.6* 2.2*   No results for input(s): LIPASE, AMYLASE in the last 168 hours. No results for input(s): AMMONIA in the last 168 hours. CBC:  Recent Labs Lab 02/18/14 2202 02/19/14 1345  02/21/14 0430  02/21/14 1620 02/21/14 2020 02/22/14 0001 02/22/14 0418 02/22/14 0840  WBC 10.0 12.9*  --  22.5*  --   --   --   --  16.9*  --   NEUTROABS  --  9.2*  --   --   --   --   --   --   --   --  HGB 8.6* 7.4*  < > 8.0*  < > 8.2* 7.8* 7.9* 7.7* 8.0*  HCT 25.8* 21.7*  < > 23.8*  < > 24.1* 23.1* 23.9* 23.4* 23.8*  MCV 85.7 83.8  --  87.5  --   --   --   --  89.3  --   PLT 233 186  --  116*  --   --   --   --  118*  --   < > = values in this interval not displayed. Cardiac Enzymes:  Recent Labs Lab 02/21/14 1218 02/21/14 2020 02/22/14 0418  TROPONINI CRITICAL RESULT CALLED TO, READ BACK BY AND VERIFIED WITH: 0.73* 0.51*   BNP (last 3 results) No results for input(s): PROBNP in the last 8760 hours. CBG: No results for input(s): GLUCAP in the last 168 hours.  Recent Results (from the past 240 hour(s))  TECHNOLOGIST REVIEW     Status: None   Collection Time: 02/13/14  9:16 AM  Result Value Ref Range Status   Technologist Review Rare Metas and Myelocytes present  Final  MRSA PCR Screening     Status: None   Collection Time: 02/19/14 12:52 AM  Result Value Ref Range Status   MRSA by PCR NEGATIVE NEGATIVE  Final    Comment:        The GeneXpert MRSA Assay (FDA approved for NASAL specimens only), is one component of a comprehensive MRSA colonization surveillance program. It is not intended to diagnose MRSA infection nor to guide or monitor treatment for MRSA infections.   Culture, blood (routine x 2)     Status: None (Preliminary result)   Collection Time: 02/20/14  5:42 PM  Result Value Ref Range Status   Specimen Description BLOOD RIGHT ARM  Final   Special Requests BOTTLES DRAWN AEROBIC AND ANAEROBIC 10CC EACH  Final   Culture   Final    GRAM POSITIVE COCCI IN PAIRS Note: Gram Stain Report Called to,Read Back By and Verified With: SUSAN@12 :50PM ON 02/21/14 BY DANTS Performed at [facility]    Report Status PENDING  Incomplete  Culture, Urine     Status: None   Collection Time: 02/20/14  6:04 PM  Result Value Ref Range Status   Specimen Description URINE, CLEAN CATCH  Final   Special Requests NONE  Final   Colony Count NO GROWTH Performed at [facility]   Final   Culture NO GROWTH Performed at [facility]   Final   Report Status 02/21/2014 FINAL  Final  Culture, blood (routine x 2)     Status: None (Preliminary result)   Collection Time: 02/20/14  6:40 PM  Result Value Ref Range Status   Specimen Description BLOOD RIGHT ARM  Final   Special Requests BOTTLES DRAWN AEROBIC AND ANAEROBIC 10CC EACH  Final   Culture   Final    GRAM POSITIVE COCCI IN PAIRS Note: Gram Stain Report Called to,Read Back By and Verified With: SUSAN@12 :50PM ON 02/21/14 BY DANTS Performed at [facility]    Report Status PENDING  Incomplete     Studies: Dg Chest 1 View  02/21/2014   CLINICAL DATA:  Central line placement.  EXAM: CHEST - 1 VIEW  COMPARISON:  02/21/2014; 02/20/2014; chest CTA - 02/21/2014  FINDINGS: Grossly unchanged enlarged cardiac silhouette and mediastinal contours post median sternotomy and CABG. 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 Interval placement of a new  left jugular approach central venous catheter whose tip projects over the thoracic aortic stent graft. No pneumothorax. No new focal airspace opacities. No pleural effusion. No definite evidence of edema. Unchanged bones.  IMPRESSION: 1. Interval placement of a new left side approach central catheter. The tip of the new central venous catheter overlies the right-sided aortic arch stent graft and while courses along the patient's left anterior chest wall Port a catheter tubing (which was noted to be appropriately positioned on recent performed chest CTA), the exact location of the tip of the new left sided central line indeterminate on this examination and correlation with blood gas and/or waveforms is recommended. No pneumothorax. 2. Cardiomegaly without acute cardiopulmonary disease. These results will be called to the ordering clinician or representative by the Radiologist Assistant, and communication documented in the PACS or zVision Dashboard.   Electronically Signed   By: Sandi Mariscal M.D.   On: 02/21/2014 19:45   Ct Angio Chest Pe W/cm &/or Wo Cm  02/21/2014   CLINICAL DATA:  Dyspnea and respiratory distress.  EXAM: CT ANGIOGRAPHY CHEST WITH CONTRAST  TECHNIQUE: Multidetector CT imaging of the chest was performed using the standard protocol during bolus administration of intravenous contrast. Multiplanar CT image reconstructions and MIPs were obtained to evaluate the vascular anatomy.  CONTRAST:  172mL OMNIPAQUE IOHEXOL 350 MG/ML SOLN  COMPARISON:  One-view chest x-ray 02/21/2014.  CT chest 12/31/2013.  FINDINGS: Pulmonary arterial opacification is satisfactory. The study is mildly degraded by breathing motion in the inferior lower lobes. No focal filling defect is present to suggest a pulmonary embolus.  A study graft is present within the right sided aortic arch.  The  The heart is mildly enlarged.  Coronary artery calcifications are present. The patient is status post CABG.  Multiple hepatic lesions are again noted.  Cirrhosis is evident.  The lung windows demonstrate stable appearance of a 4 mm nodule within the lingula. A high-density granuloma is present posteriorly in the right lower lobe. Dependent atelectasis has progressed bilaterally. No new nodule, mass, or airspace disease is present otherwise.  The bone windows demonstrate no focal lytic or blastic lesions.  Review of the MIP images confirms the above findings.  IMPRESSION: 1. No evidence for pulmonary embolus. The study is mildly degraded by patient breathing motion at the lung bases bilaterally. 2. Right-sided aortic arch. 3. An aortic stent graft is in place. 4. Stable 4 mm nodule within the lingula. 5. Mild increase in dependent atelectasis.   Electronically Signed   By: Lawrence Santiago M.D.   On: 02/21/2014 14:31   Dg Chest Port 1 View  02/21/2014   CLINICAL DATA:  Edema.  EXAM: PORTABLE CHEST - 1 VIEW  COMPARISON:  02/20/2014.  FINDINGS: Power port catheter in stable position. Mediastinum and hilar structures are stable. Right-sided aorta with aortic stent graft, stable in appearance. Stable cardiomegaly with stable mild pulmonary vascular and interstitial prominence. Mild CHF cannot be excluded. No pleural effusion or pneumothorax.  IMPRESSION: 1. Prior pleural catheter stable position. 2. Right-sided aorta with stent graft, stable in appearance. 3. Prior CABG. Stable cardiomegaly with stable mild pulmonary venous congestion and mild interstitial prominence suggesting mild interstitial edema.   Electronically Signed   By: Marcello Moores  Register   On: 02/21/2014 07:27   Dg Chest Port 1 View  02/20/2014   CLINICAL DATA:  Acute onset of shortness of breath. Subsequent encounter.  EXAM: PORTABLE CHEST - 1 VIEW  COMPARISON:  Chest radiograph performed earlier today at 6:31  p.m.  FINDINGS: The lungs are well-aerated. Vascular congestion is again  noted, with minimal bilateral atelectasis. There is no evidence of pleural effusion or pneumothorax.  The cardiomediastinal silhouette is mildly enlarged. The patient is status post median sternotomy. An aortic stent graft is again noted. A left IJ line is noted ending about the mid SVC. No acute osseous abnormalities are seen.  IMPRESSION: Vascular congestion and mild cardiomegaly again noted, with minimal bilateral atelectasis.   Electronically Signed   By: Garald Balding M.D.   On: 02/20/2014 22:42   Dg Chest Port 1 View  02/20/2014   CLINICAL DATA:  Fever, prior heart surgery  EXAM: PORTABLE CHEST - 1 VIEW  COMPARISON:  01/06/2014  FINDINGS: There is mild bilateral interstitial thickening. There is no focal parenchymal opacity, pleural effusion, or pneumothorax. There is stable cardiomegaly. There is a right-sided aortic arch with an aortic stent graft present. There is a left IJ central venous catheter with the tip projecting over the SVC.  The osseous structures are unremarkable.  IMPRESSION: 1. Bilateral mild interstitial thickening likely reflecting mild pulmonary vascular congestion. 2. Cardiomegaly with a right-sided aortic arch with an aortic stent graft present.   Electronically Signed   By: Kathreen Devoid   On: 02/20/2014 19:03    Scheduled Meds: . sodium chloride   Intravenous Once  . antiseptic oral rinse  7 mL Mouth Rinse q12n4p  . chlorhexidine  15 mL Mouth Rinse BID  . dronabinol  5 mg Oral TID AC  . feeding supplement (ENSURE COMPLETE)  237 mL Oral BID BM  . folic acid  1 mg Oral Daily  . lactose free nutrition  237 mL Oral TID WC  . levofloxacin (LEVAQUIN) IV  750 mg Intravenous q1800  . pantoprazole (PROTONIX) IV  40 mg Intravenous Q12H  . pravastatin  20 mg Oral q1800  . saccharomyces boulardii  250 mg Oral BID  . sodium chloride  3 mL Intravenous Q12H  . vancomycin  1,000 mg Intravenous Q12H   Continuous Infusions: . sodium chloride 999 mL/hr (02/20/14 1718)  . 0.9 % NaCl  with KCl 40 mEq / L 100 mL/hr (02/22/14 0920)     Time spent: > 35 minutes    Velvet Bathe  [facility] Pager [phone] If 7PM-7AM, please contact night-coverage at [email or web address], password [facility] 02/22/2014, 9:37 AM  LOS: 4 days

## 2014-02-22 NOTE — Progress Notes (Signed)
Echocardiogram 2D Echocardiogram has been performed.  [name] 02/22/2014, 10:27 AM

## 2014-02-22 NOTE — Progress Notes (Addendum)
Progress Note from the Palliative Medicine Team at [place]: [name] is lying in bed. Complains of fatigue and shortness of breath. His echo shows grade 1 diastolic dysfunction and I wonder if his anemia caused demand ischemia causing shortness of breath. He is receiving another unit of PRBC currently. He is tired and frustrated with these complications but appreciative of his care and all that is being done for him. He continues to tell me that he would want to be resuscitated but also tells me that he wants his ashes sent to [place] - so he is understanding of his poor prognosis and as we decided a few days ago he is "reluctantly accepting."    Objective: Allergies  Allergen Reactions  . Penicillins     childhood   Scheduled Meds: . antiseptic oral rinse  7 mL Mouth Rinse q12n4p  . chlorhexidine  15 mL Mouth Rinse BID  . dronabinol  5 mg Oral TID AC  . feeding supplement (ENSURE COMPLETE)  237 mL Oral BID BM  . folic acid  1 mg Oral Daily  . lactose free nutrition  237 mL Oral TID WC  . pantoprazole (PROTONIX) IV  40 mg Intravenous Q12H  . potassium phosphate IVPB (mmol)  24 mmol Intravenous Once  . pravastatin  20 mg Oral q1800  . saccharomyces boulardii  250 mg Oral BID  . sodium chloride  3 mL Intravenous Q12H  . vancomycin  1,000 mg Intravenous Q12H   Continuous Infusions: . 0.9 % NaCl with KCl 40 mEq / L 100 mL/hr (02/22/14 0920)   PRN Meds:.acetaminophen **OR** acetaminophen, ondansetron **OR** ondansetron (ZOFRAN) IV, oxyCODONE, traZODone  BP 110/88 mmHg  Pulse 104  Temp(Src) 99.2 F (37.3 C) (Oral)  Resp 23  Ht 5\' 10"  (1.778 m)  Wt 75.8 kg (167 lb 1.7 oz)  BMI 23.98 kg/m2  SpO2 99%   PPS: 20%   Intake/Output Summary (Last 24 hours) at 02/22/14 1531 Last data filed at 02/22/14 1500  Gross per 24 hour  Intake   5750 ml  Output   4050 ml  Net   1700 ml      LBM: 1/13 - reports he has had no recurrent visible bleeding since flex sigmoid.    Physical Exam:  General: NAD, lying in bed HEENT: [place]/AT, no JVD, moist mucous membranes Chest: Slightly labored breathing with conversation - not noted at rest, symmetric CVS: RRR Abdomen: Soft, NT, ND, +BS Ext: MAE, no edema, warm to touch Neuro: Awake, alert, oriented x 3   Labs: CBC    Component Value Date/Time   WBC 16.9* 02/22/2014 0418   WBC 12.1* 02/13/2014 0916   WBC 10.4* 01/22/2014 0848   RBC 2.62* 02/22/2014 0418   RBC 3.87* 02/13/2014 0916   RBC 4.04* 01/22/2014 0848   HGB 8.0* 02/22/2014 0840   HGB 11.4* 02/13/2014 0916   HGB 11.5* 01/22/2014 0848   HCT 23.8* 02/22/2014 0840   HCT 33.5* 02/13/2014 0916   HCT 34.6* 01/22/2014 0848   PLT 118* 02/22/2014 0418   PLT 295 02/13/2014 0916   PLT 350 01/22/2014 0848   MCV 89.3 02/22/2014 0418   MCV 86.6 02/13/2014 0916   MCV 86 01/22/2014 0848   MCH 29.4 02/22/2014 0418   MCH 29.5 02/13/2014 0916   MCH 28.5 01/22/2014 0848   MCHC 32.9 02/22/2014 0418   MCHC 34.0 02/13/2014 0916   MCHC 33.2 01/22/2014 0848   RDW 19.4* 02/22/2014 0418   RDW 19.8* 02/13/2014  0916   RDW 19.8* 01/22/2014 0848   LYMPHSABS 2.7 02/19/2014 1345   LYMPHSABS 1.1 02/13/2014 0916   LYMPHSABS 1.7 01/22/2014 0848   MONOABS 1.0 02/19/2014 1345   MONOABS 3.1* 02/13/2014 0916   EOSABS 0.0 02/19/2014 1345   EOSABS 0.3 02/13/2014 0916   EOSABS 0.3 01/22/2014 0848   BASOSABS 0.0 02/19/2014 1345   BASOSABS 0.0 02/13/2014 0916   BASOSABS 0.0 01/22/2014 0848    BMET    Component Value Date/Time   NA 131* 02/22/2014 0418   NA 136 02/13/2014 0917   NA 137 01/22/2014 0848   K 4.3 02/22/2014 0418   K 3.5 02/13/2014 0917   K 3.6 01/22/2014 0848   CL 102 02/22/2014 0418   CL 96* 01/22/2014 0848   CO2 26 02/22/2014 0418   CO2 26 02/13/2014 0917   CO2 25 01/22/2014 0848   GLUCOSE 114* 02/22/2014 0418   GLUCOSE 90 02/13/2014 0917   GLUCOSE 144* 01/22/2014 0848   BUN 15 02/22/2014 0418   BUN 17.3 02/13/2014 0917   BUN 14 01/22/2014 0848    CREATININE 0.84 02/22/2014 0418   CREATININE 1.0 02/13/2014 0917   CREATININE 1.3* 01/22/2014 0848   CALCIUM 7.2* 02/22/2014 0418   CALCIUM 8.9 02/13/2014 0917   CALCIUM 8.8 01/22/2014 0848   GFRNONAA >90 02/22/2014 0418   GFRAA >90 02/22/2014 0418    CMP     Component Value Date/Time   NA 131* 02/22/2014 0418   NA 136 02/13/2014 0917   NA 137 01/22/2014 0848   K 4.3 02/22/2014 0418   K 3.5 02/13/2014 0917   K 3.6 01/22/2014 0848   CL 102 02/22/2014 0418   CL 96* 01/22/2014 0848   CO2 26 02/22/2014 0418   CO2 26 02/13/2014 0917   CO2 25 01/22/2014 0848   GLUCOSE 114* 02/22/2014 0418   GLUCOSE 90 02/13/2014 0917   GLUCOSE 144* 01/22/2014 0848   BUN 15 02/22/2014 0418   BUN 17.3 02/13/2014 0917   BUN 14 01/22/2014 0848   CREATININE 0.84 02/22/2014 0418   CREATININE 1.0 02/13/2014 0917   CREATININE 1.3* 01/22/2014 0848   CALCIUM 7.2* 02/22/2014 0418   CALCIUM 8.9 02/13/2014 0917   CALCIUM 8.8 01/22/2014 0848   PROT 5.1* 02/19/2014 1345   PROT 7.3 02/13/2014 0917   PROT 7.5 01/22/2014 0848   ALBUMIN 2.2* 02/19/2014 1345   ALBUMIN 2.5* 02/13/2014 0917   AST 29 02/19/2014 1345   AST 37* 02/13/2014 0917   AST 64* 01/22/2014 0848   ALT 11 02/19/2014 1345   ALT 14 02/13/2014 0917   ALT 17 01/22/2014 0848   ALKPHOS 141* 02/19/2014 1345   ALKPHOS 306* 02/13/2014 0917   ALKPHOS 261* 01/22/2014 0848   BILITOT 0.8 02/19/2014 1345   BILITOT 0.24 02/13/2014 0917   BILITOT 0.50 01/22/2014 0848   GFRNONAA >90 02/22/2014 0418   GFRAA >90 02/22/2014 0418    Assessment and Plan: 1. Code Status: FULL 2. Symptom Control: 1. Shortness of breath: Continue with recommended transfusions and medical investigation/intervention for reversible source. May consider low dose morphine for relief especially if no identified reversible source.  2. Weakness: Continue medical treatment/interventions. Including intervention to control bleeding and blood transfusions.  3. Decreased  appetite: He is drinking numerous Ensure a day but not consuming much intake otherwise.  4. Sleep: Agree with trazodone for sleep. Nursing to offer.  3. Psycho/Social: Emotional support provided to patient.  4. Spiritual: He is very religious and does not seem fearful  of dying just reluctant to die.  5. Disposition: To be determined.     Time In Time Out Total Time Spent with Patient Total Overall Time  1510 1530 33min 61min    Greater than 50%  of this time was spent counseling and coordinating care related to the above assessment and plan.  Vinie Sill, NP Palliative Medicine Team Pager # [phone] (M-F 8a-5p) Team Phone # [phone] (Nights/Weekends)

## 2014-02-22 NOTE — Care Management Note (Addendum)
    Page 1 of 1   02/27/2014     2:43:27 PM CARE MANAGEMENT NOTE 02/27/2014  Patient:  [name]   Account Number:  [id number]  Date Initiated:  02/19/2014  Documentation initiated by:  [facility]  Subjective/Objective Assessment:   adm: Bright red blood per rectum.     Action/Plan:   From home.   Anticipated DC Date:  03/01/2014   Anticipated DC Plan:  [place]  CM consult      Choice offered to / List presented to:             Status of service:  In process, will continue to follow Medicare Important Message given?   (If response is "NO", the following Medicare IM given date fields will be blank) Date Medicare IM given:   Medicare IM given by:   Date Additional Medicare IM given:   Additional Medicare IM given by:    Discharge Disposition:    Per UR Regulation:  Reviewed for med. necessity/level of care/duration of stay  If discussed at [place] of Stay Meetings, dates discussed:    Comments:  02/27/14 Dessa Phi RN BSN NCM [phone] ID-PAC-strep bacteremia-iv abx.Soft diet. Palliative-full code.d/C plan home.  02/22/14 Dessa Phi RN BSN NCM [phone] Palliative care following.PCCM-acute diastolic dysfunction-iv lasix x1-signed off.Monitor progress.  02/19/14 14:45 CM notes pt has had home health services in past provided by [facility]  Pt

## 2014-02-22 NOTE — Progress Notes (Signed)
CRITICAL VALUE ALERT  Critical value received:  Troponin 0.51  Date of notification: 0.15.16  Time of notification:  40  Critical value read back: Yes  Nurse who received alert:  Rito Ehrlich, RN  MD notified (1st page): Elink  Time of first page:  0550  MD notified (2nd page):  Time of second page:  Responding MD:  Warren Lacy  Time MD responded:  [phone]

## 2014-02-23 LAB — TYPE AND SCREEN
ABO/RH(D): A POS
ANTIBODY SCREEN: POSITIVE
DAT, IGG: NEGATIVE
Unit division: 0
Unit division: 0
Unit division: 0
Unit division: 0

## 2014-02-23 LAB — HEMOGLOBIN AND HEMATOCRIT, BLOOD
HCT: 27.2 % — ABNORMAL LOW (ref 39.0–52.0)
HCT: 27.1 % — ABNORMAL LOW (ref 39.0–52.0)
HEMATOCRIT: 22.5 % — AB (ref 39.0–52.0)
HEMATOCRIT: 27 % — AB (ref 39.0–52.0)
HEMOGLOBIN: 9 g/dL — AB (ref 13.0–17.0)
HEMOGLOBIN: 9 g/dL — AB (ref 13.0–17.0)
Hemoglobin: 7.5 g/dL — ABNORMAL LOW (ref 13.0–17.0)
Hemoglobin: 8.9 g/dL — ABNORMAL LOW (ref 13.0–17.0)

## 2014-02-23 LAB — CULTURE, BLOOD (ROUTINE X 2)

## 2014-02-23 LAB — PROCALCITONIN: PROCALCITONIN: 0.97 ng/mL

## 2014-02-23 LAB — PREPARE RBC (CROSSMATCH)

## 2014-02-23 MED ORDER — SODIUM CHLORIDE 0.9 % IV SOLN
Freq: Once | INTRAVENOUS | Status: AC
  Administered 2014-02-23: 10:00:00 via INTRAVENOUS

## 2014-02-23 MED ORDER — TRAZODONE HCL 50 MG PO TABS
50.0000 mg | ORAL_TABLET | Freq: Every day | ORAL | Status: DC
  Administered 2014-02-23 – 2014-02-24 (×2): 50 mg via ORAL
  Filled 2014-02-23 (×3): qty 1

## 2014-02-23 NOTE — Progress Notes (Signed)
[facility] PROGRESS NOTE  [name] WNU:[id number] DOB: April 22, 1955 DOA: 02/18/2014 PCP: No primary care provider on file.  Assessment/Plan:  Principal Problem:   Hypotension - Most likely due to intravascular depletion. Has resolved with IVF rehydration. - Continue to monitor - Pt had central line placed, currently not on pressors    BRBPR (bright red blood per rectum) - Plan on transfusing another unit of PRBC given history of recent GI bleed, transfused yesterday but had drop of 9 to 7.5  Active Problems:   Cerebral embolism with cerebral infarction - stable no new neurological deficits reported. - aspirin on hold due to GI bleed.    Hypokalemia - resolved    Acute GI bleeding - GI on board and patient is s/p flexible sigmoidoscopy with friable areas of the tumor obliterated and cauterized utilizing the argon plasma coagulator - aspirin on hold    Decreased appetite - Currently on Boost and on bland diet.    Weakness generalized - Once hypotension fully resolved will plan on having PT evaluate and work with patient.    Palliative care encounter - Currently on board and will assist with planning  Code Status:  Family Communication:  Disposition Plan:    Consultants:  GI  Palliative  Procedures:  As listed above  Antibiotics:  Levaquin  Vancomycin  HPI/Subjective: Pt has no new complaints. No acute issues reported overnight.  Objective: Filed Vitals:   02/23/14 1000  BP: 114/87  Pulse:   Temp:   Resp: 21    Intake/Output Summary (Last 24 hours) at 02/23/14 1036 Last data filed at 02/23/14 1000  Gross per 24 hour  Intake 3574.5 ml  Output   4025 ml  Net -450.5 ml   Filed Weights   02/21/14 0452 02/22/14 0500 02/23/14 0422  Weight: 74 kg (163 lb 2.3 oz) 75.8 kg (167 lb 1.7 oz) 74.9 kg (165 lb 2 oz)    Exam:   General:  Pt in NAD, alert and awake  Cardiovascular: rrr, no murmurs  Respiratory: cta bl, no wheezes  Abdomen: +  bowel sounds, soft, NT  Musculoskeletal: no cyanosis or clubbing   Data Reviewed: Basic Metabolic Panel:  Recent Labs Lab 02/18/14 2201 02/18/14 2202 02/19/14 1345 02/21/14 0430 02/22/14 0418  NA  --  133* 133* 133* 131*  K  --  2.6* 4.0 3.7 4.3  CL  --  101 104 101 102  CO2  --  25 22 26 26   GLUCOSE  --  127* 104* 98 114*  BUN  --  18 19 14 15   CREATININE  --  1.09 1.17 0.96 0.84  CALCIUM  --  7.9* 7.4* 7.5* 7.2*  MG 1.9  --   --   --  1.3*  PHOS  --   --   --   --  1.8*   Liver Function Tests:  Recent Labs Lab 02/18/14 2202 02/19/14 1345  AST 33 29  ALT 14 11  ALKPHOS 191* 141*  BILITOT 0.7 0.8  PROT 6.2 5.1*  ALBUMIN 2.6* 2.2*   No results for input(s): LIPASE, AMYLASE in the last 168 hours. No results for input(s): AMMONIA in the last 168 hours. CBC:  Recent Labs Lab 02/18/14 2202 02/19/14 1345  02/21/14 0430  02/22/14 0418 02/22/14 0840 02/22/14 2230 02/23/14 0200 02/23/14 0555  WBC 10.0 12.9*  --  22.5*  --  16.9*  --   --   --   --   NEUTROABS  --  9.2*  --   --   --   --   --   --   --   --  HGB 8.6* 7.4*  < > 8.0*  < > 7.7* 8.0* 8.9* 9.0* 7.5*  HCT 25.8* 21.7*  < > 23.8*  < > 23.4* 23.8* 27.3* 27.2* 22.5*  MCV 85.7 83.8  --  87.5  --  89.3  --   --   --   --   PLT 233 186  --  116*  --  118*  --   --   --   --   < > = values in this interval not displayed. Cardiac Enzymes:  Recent Labs Lab 02/21/14 1218 02/21/14 2020 02/22/14 0418  TROPONINI CRITICAL RESULT CALLED TO, READ BACK BY AND VERIFIED WITH: 0.73* 0.51*   BNP (last 3 results) No results for input(s): PROBNP in the last 8760 hours. CBG: No results for input(s): GLUCAP in the last 168 hours.  Recent Results (from the past 240 hour(s))  MRSA PCR Screening     Status: None   Collection Time: 02/19/14 12:52 AM  Result Value Ref Range Status   MRSA by PCR NEGATIVE NEGATIVE Final    Comment:        The GeneXpert MRSA Assay (FDA approved for NASAL specimens only), is one  component of a comprehensive MRSA colonization surveillance program. It is not intended to diagnose MRSA infection nor to guide or monitor treatment for MRSA infections.   Culture, blood (routine x 2)     Status: None   Collection Time: 02/20/14  5:42 PM  Result Value Ref Range Status   Specimen Description BLOOD RIGHT ARM  Final   Special Requests BOTTLES DRAWN AEROBIC AND ANAEROBIC 10CC EACH  Final   Culture   Final    GROUP B STREP(S.AGALACTIAE)ISOLATED Note: SUSCEPTIBILITIES PERFORMED ON PREVIOUS CULTURE WITHIN THE LAST 5 DAYS. Note: Gram Stain Report Called to,Read Back By and Verified With: SUSAN@12 :50PM ON 02/21/14 BY DANTS Performed at [facility]    Report Status 02/23/2014 FINAL  Final  Culture, Urine     Status: None   Collection Time: 02/20/14  6:04 PM  Result Value Ref Range Status   Specimen Description URINE, CLEAN CATCH  Final   Special Requests NONE  Final   Colony Count NO GROWTH Performed at [facility]   Final   Culture NO GROWTH Performed at [facility]   Final   Report Status 02/21/2014 FINAL  Final  Culture, blood (routine x 2)     Status: None   Collection Time: 02/20/14  6:40 PM  Result Value Ref Range Status   Specimen Description BLOOD RIGHT ARM  Final   Special Requests BOTTLES DRAWN AEROBIC AND ANAEROBIC 10CC EACH  Final   Culture   Final    GROUP B STREP(S.AGALACTIAE)ISOLATED Note: Gram Stain Report Called to,Read Back By and Verified With: SUSAN@12 :50PM ON 02/21/14 BY DANTS Performed at [facility]    Report Status 02/23/2014 FINAL  Final   Organism ID, Bacteria GROUP B STREP(S.AGALACTIAE)ISOLATED  Final      Susceptibility   Group b strep(s.agalactiae)isolated - MIC*    AMPICILLIN <=0.25 SENSITIVE Sensitive     PENICILLIN <=0.06 SENSITIVE Sensitive     CLINDAMYCIN 0.5 INTERMEDIATE Intermediate     ERYTHROMYCIN <=0.12 SENSITIVE Sensitive     VANCOMYCIN 0.5 SENSITIVE Sensitive     LEVOFLOXACIN 1  SENSITIVE Sensitive     * GROUP B STREP(S.AGALACTIAE)ISOLATED     Studies: Dg Chest 1 View  02/21/2014   CLINICAL DATA:  Central line placement.  EXAM: CHEST - 1 VIEW  COMPARISON:  02/21/2014; 02/20/2014; chest CTA - 02/21/2014  FINDINGS: Grossly unchanged enlarged cardiac silhouette and mediastinal contours post median sternotomy and CABG. Similar findings of a right-sided aortic arch and the sequela of prior endovascular stent graft repair of the thoracic aorta. Interval placement of a new left jugular approach central venous catheter whose tip projects over the thoracic aortic stent graft. No pneumothorax. No new focal airspace opacities. No pleural effusion. No definite evidence of edema. Unchanged bones.  IMPRESSION: 1. Interval placement of a new left side approach central catheter. The tip of the new central venous catheter overlies the right-sided aortic arch stent graft and while courses along the patient's left anterior chest wall Port a catheter tubing (which was noted to be appropriately positioned on recent performed chest CTA), the exact location of the tip of the new left sided central line indeterminate on this examination and correlation with blood gas and/or waveforms is recommended. No pneumothorax. 2. Cardiomegaly without acute cardiopulmonary disease. These results will be called to the ordering clinician or representative by the Radiologist Assistant, and communication documented in the PACS or zVision Dashboard.   Electronically Signed   By: Sandi Mariscal M.D.   On: 02/21/2014 19:45   Ct Angio Chest Pe W/cm &/or Wo Cm  02/21/2014   CLINICAL DATA:  Dyspnea and respiratory distress.  EXAM: CT ANGIOGRAPHY CHEST WITH CONTRAST  TECHNIQUE: Multidetector CT imaging of the chest was performed using the standard protocol during bolus administration of intravenous contrast. Multiplanar CT image reconstructions and MIPs were obtained to evaluate the vascular anatomy.  CONTRAST:  126mL OMNIPAQUE  IOHEXOL 350 MG/ML SOLN  COMPARISON:  One-view chest x-ray 02/21/2014.  CT chest 12/31/2013.  FINDINGS: Pulmonary arterial opacification is satisfactory. The study is mildly degraded by breathing motion in the inferior lower lobes. No focal filling defect is present to suggest a pulmonary embolus.  A study graft is present within the right sided aortic arch.  The  The heart is mildly enlarged. Coronary artery calcifications are present. The patient is status post CABG.  Multiple hepatic lesions are again noted.  Cirrhosis is evident.  The lung windows demonstrate stable appearance of a 4 mm nodule within the lingula. A high-density granuloma is present posteriorly in the right lower lobe. Dependent atelectasis has progressed bilaterally. No new nodule, mass, or airspace disease is present otherwise.  The bone windows demonstrate no focal lytic or blastic lesions.  Review of the MIP images confirms the above findings.  IMPRESSION: 1. No evidence for pulmonary embolus. The study is mildly degraded by patient breathing motion at the lung bases bilaterally. 2. Right-sided aortic arch. 3. An aortic stent graft is in place. 4. Stable 4 mm nodule within the lingula. 5. Mild increase in dependent atelectasis.   Electronically Signed   By: Lawrence Santiago M.D.   On: 02/21/2014 14:31    Scheduled Meds: . antiseptic oral rinse  7 mL Mouth Rinse q12n4p  . chlorhexidine  15 mL Mouth Rinse BID  . dronabinol  5 mg Oral TID AC  . feeding supplement (ENSURE COMPLETE)  237 mL Oral BID BM  . folic acid  1 mg Oral Daily  . lactose free nutrition  237 mL Oral TID WC  . pantoprazole (PROTONIX) IV  40 mg Intravenous Q12H  . pravastatin  20 mg Oral q1800  . saccharomyces boulardii  250 mg Oral BID  . sodium chloride  3 mL Intravenous Q12H  . vancomycin  1,000 mg Intravenous Q12H  Continuous Infusions: . 0.9 % NaCl with KCl 40 mEq / L 100 mL/hr (02/23/14 0900)     Time spent: > 35 minutes    Velvet Bathe  [facility] Pager [phone] If 7PM-7AM, please contact night-coverage at [email or web address], password [facility] 02/23/2014, 10:36 AM  LOS: 5 days

## 2014-02-23 NOTE — Progress Notes (Signed)
Bed alarm activated per protocol.. OOB without calling for help after being instructed to do so; note progressively more confused this afternoon. Withdrawn, sleeping most of day, irritable behavior. Consulted [facility]. Will restart Trazodone at HS(home med).

## 2014-02-24 LAB — HEMOGLOBIN AND HEMATOCRIT, BLOOD
HCT: 29.1 % — ABNORMAL LOW (ref 39.0–52.0)
HEMATOCRIT: 27.1 % — AB (ref 39.0–52.0)
Hemoglobin: 8.8 g/dL — ABNORMAL LOW (ref 13.0–17.0)
Hemoglobin: 9.3 g/dL — ABNORMAL LOW (ref 13.0–17.0)

## 2014-02-24 LAB — BASIC METABOLIC PANEL
Anion gap: 7 (ref 5–15)
BUN: 16 mg/dL (ref 6–23)
CO2: 30 mmol/L (ref 19–32)
Calcium: 7 mg/dL — ABNORMAL LOW (ref 8.4–10.5)
Chloride: 96 mEq/L (ref 96–112)
Creatinine, Ser: 0.83 mg/dL (ref 0.50–1.35)
GLUCOSE: 111 mg/dL — AB (ref 70–99)
Potassium: 4.8 mmol/L (ref 3.5–5.1)
Sodium: 133 mmol/L — ABNORMAL LOW (ref 135–145)

## 2014-02-24 MED ORDER — FERROUS SULFATE 325 (65 FE) MG PO TABS: 325.0000 mg | ORAL_TABLET | Freq: Two times a day (BID) | ORAL | Status: DC

## 2014-02-24 MED ORDER — CEFAZOLIN SODIUM-DEXTROSE 2-3 GM-% IV SOLR
2.0000 g | Freq: Three times a day (TID) | INTRAVENOUS | Status: DC
  Administered 2014-02-24 – 2014-02-25 (×4): 2 g via INTRAVENOUS
  Filled 2014-02-24 (×4): qty 50

## 2014-02-24 NOTE — Progress Notes (Addendum)
[facility] PROGRESS NOTE  Roel Douthat TDD:[id number] DOB: Jun 19, 1955 DOA: 02/18/2014 PCP: No primary care provider on file.  Assessment/Plan:  Addendum: Bacteremia - Growing streptococcus - Will tailor antibiotic regimen based on sensitivities, discussed with pharmacy  Principal Problem:   Hypotension - Most likely due to intravascular depletion. Has resolved with IVF rehydration. - Continue to monitor - Agree with removing central line - transfer to floor    BRBPR (bright red blood per rectum) - HGB steady near or at 9 - Will add ferrous sulfate  Active Problems:   Cerebral embolism with cerebral infarction - stable no new neurological deficits reported. - aspirin on hold due to GI bleed.    Hypokalemia - resolved    Acute GI bleeding - GI on board and patient is s/p flexible sigmoidoscopy with friable areas of the tumor obliterated and cauterized utilizing the argon plasma coagulator - aspirin on hold    Decreased appetite - Currently on Boost and on bland diet.    Weakness generalized - Once hypotension fully resolved will plan on having PT evaluate and work with patient.    Palliative care encounter - Currently on board and will assist with planning  Code Status:  Family Communication:  Disposition Plan:    Consultants:  GI  Palliative  Procedures:  As listed above  Antibiotics:  Levaquin  Vancomycin  HPI/Subjective: Pt has no new complaints. No acute issues reported overnight. Denies any abdominal discomfort.  Objective: Filed Vitals:   02/24/14 0800  BP: 131/100  Pulse:   Temp: 98.4 F (36.9 C)  Resp: 22    Intake/Output Summary (Last 24 hours) at 02/24/14 1111 Last data filed at 02/24/14 0900  Gross per 24 hour  Intake 4971.67 ml  Output   3476 ml  Net 1495.67 ml   Filed Weights   02/22/14 0500 02/23/14 0422 02/24/14 0400  Weight: 75.8 kg (167 lb 1.7 oz) 74.9 kg (165 lb 2 oz) 73.3 kg (161 lb 9.6 oz)     Exam:   General:  Pt in NAD, alert and awake  Cardiovascular: rrr, no murmurs  Respiratory: cta bl, no wheezes  Abdomen: + bowel sounds, soft, NT  Musculoskeletal: no cyanosis or clubbing   Data Reviewed: Basic Metabolic Panel:  Recent Labs Lab 02/18/14 2201 02/18/14 2202 02/19/14 1345 02/21/14 0430 02/22/14 0418 02/24/14 0221  NA  --  133* 133* 133* 131* 133*  K  --  2.6* 4.0 3.7 4.3 4.8  CL  --  101 104 101 102 96  CO2  --  25 22 26 26 30   GLUCOSE  --  127* 104* 98 114* 111*  BUN  --  18 19 14 15 16   CREATININE  --  1.09 1.17 0.96 0.84 0.83  CALCIUM  --  7.9* 7.4* 7.5* 7.2* 7.0*  MG 1.9  --   --   --  1.3*  --   PHOS  --   --   --   --  1.8*  --    Liver Function Tests:  Recent Labs Lab 02/18/14 2202 02/19/14 1345  AST 33 29  ALT 14 11  ALKPHOS 191* 141*  BILITOT 0.7 0.8  PROT 6.2 5.1*  ALBUMIN 2.6* 2.2*   No results for input(s): LIPASE, AMYLASE in the last 168 hours. No results for input(s): AMMONIA in the last 168 hours. CBC:  Recent Labs Lab 02/18/14 2202 02/19/14 1345  02/21/14 0430  02/22/14 0418  02/23/14 0555 02/23/14 1020 02/23/14 1745 02/24/14 0200  02/24/14 0949  WBC 10.0 12.9*  --  22.5*  --  16.9*  --   --   --   --   --   --   NEUTROABS  --  9.2*  --   --   --   --   --   --   --   --   --   --   HGB 8.6* 7.4*  < > 8.0*  < > 7.7*  < > 7.5* 8.9* 9.0* 8.8* 9.3*  HCT 25.8* 21.7*  < > 23.8*  < > 23.4*  < > 22.5* 27.0* 27.1* 27.1* 29.1*  MCV 85.7 83.8  --  87.5  --  89.3  --   --   --   --   --   --   PLT 233 186  --  116*  --  118*  --   --   --   --   --   --   < > = values in this interval not displayed. Cardiac Enzymes:  Recent Labs Lab 02/21/14 1218 02/21/14 2020 02/22/14 0418  TROPONINI CRITICAL RESULT CALLED TO, READ BACK BY AND VERIFIED WITH: 0.73* 0.51*   BNP (last 3 results) No results for input(s): PROBNP in the last 8760 hours. CBG: No results for input(s): GLUCAP in the last 168 hours.  Recent Results  (from the past 240 hour(s))  MRSA PCR Screening     Status: None   Collection Time: 02/19/14 12:52 AM  Result Value Ref Range Status   MRSA by PCR NEGATIVE NEGATIVE Final    Comment:        The GeneXpert MRSA Assay (FDA approved for NASAL specimens only), is one component of a comprehensive MRSA colonization surveillance program. It is not intended to diagnose MRSA infection nor to guide or monitor treatment for MRSA infections.   Culture, blood (routine x 2)     Status: None   Collection Time: 02/20/14  5:42 PM  Result Value Ref Range Status   Specimen Description BLOOD RIGHT ARM  Final   Special Requests BOTTLES DRAWN AEROBIC AND ANAEROBIC 10CC EACH  Final   Culture   Final    GROUP B STREP(S.AGALACTIAE)ISOLATED Note: SUSCEPTIBILITIES PERFORMED ON PREVIOUS CULTURE WITHIN THE LAST 5 DAYS. Note: Gram Stain Report Called to,Read Back By and Verified With: SUSAN@12 :50PM ON 02/21/14 BY DANTS Performed at [facility]    Report Status 02/23/2014 FINAL  Final  Culture, Urine     Status: None   Collection Time: 02/20/14  6:04 PM  Result Value Ref Range Status   Specimen Description URINE, CLEAN CATCH  Final   Special Requests NONE  Final   Colony Count NO GROWTH Performed at [facility]   Final   Culture NO GROWTH Performed at [facility]   Final   Report Status 02/21/2014 FINAL  Final  Culture, blood (routine x 2)     Status: None   Collection Time: 02/20/14  6:40 PM  Result Value Ref Range Status   Specimen Description BLOOD RIGHT ARM  Final   Special Requests BOTTLES DRAWN AEROBIC AND ANAEROBIC 10CC EACH  Final   Culture   Final    GROUP B STREP(S.AGALACTIAE)ISOLATED Note: Gram Stain Report Called to,Read Back By and Verified With: SUSAN@12 :50PM ON 02/21/14 BY DANTS Performed at [facility]    Report Status 02/23/2014 FINAL  Final   Organism ID, Bacteria GROUP B STREP(S.AGALACTIAE)ISOLATED  Final  Susceptibility   Group b  strep(s.agalactiae)isolated - MIC*    AMPICILLIN <=0.25 SENSITIVE Sensitive     PENICILLIN <=0.06 SENSITIVE Sensitive     CLINDAMYCIN 0.5 INTERMEDIATE Intermediate     ERYTHROMYCIN <=0.12 SENSITIVE Sensitive     VANCOMYCIN 0.5 SENSITIVE Sensitive     LEVOFLOXACIN 1 SENSITIVE Sensitive     * GROUP B STREP(S.AGALACTIAE)ISOLATED     Studies: No results found.  Scheduled Meds: . antiseptic oral rinse  7 mL Mouth Rinse q12n4p  . chlorhexidine  15 mL Mouth Rinse BID  . dronabinol  5 mg Oral TID AC  . feeding supplement (ENSURE COMPLETE)  237 mL Oral BID BM  . ferrous sulfate  325 mg Oral BID WC  . folic acid  1 mg Oral Daily  . lactose free nutrition  237 mL Oral TID WC  . pantoprazole (PROTONIX) IV  40 mg Intravenous Q12H  . pravastatin  20 mg Oral q1800  . saccharomyces boulardii  250 mg Oral BID  . sodium chloride  3 mL Intravenous Q12H  . traZODone  50 mg Oral QHS  . vancomycin  1,000 mg Intravenous Q12H   Continuous Infusions: . 0.9 % NaCl with KCl 40 mEq / L 100 mL/hr (02/24/14 0644)     Time spent: > 35 minutes    Velvet Bathe  [facility] Pager [phone] If 7PM-7AM, please contact night-coverage at [email or web address], password [facility] 02/24/2014, 11:11 AM  LOS: 6 days

## 2014-02-24 NOTE — Progress Notes (Signed)
ANTIBIOTIC CONSULT NOTE - INITIAL  Pharmacy Consult for cefazolin Indication: Group B strep bacteremia  Allergies  Allergen Reactions  . Penicillins     childhood    Patient Measurements: Height: 5\' 10"  (177.8 cm) Weight: 161 lb 9.6 oz (73.3 kg) IBW/kg (Calculated) : 73   Vital Signs: Temp: 98.4 F (36.9 C) (01/17 0800) Temp Source: Oral (01/17 0800) BP: 124/97 mmHg (01/17 1200) Intake/Output from previous day: 01/16 0701 - 01/17 0700 In: 5195 [P.O.:2340; I.V.:2375; IV Piggyback:400] Out: 2952 [Urine:3875; Stool:1] Intake/Output from this shift: Total I/O In: 1346.7 [P.O.:720; I.V.:626.7] Out: 1700 [Urine:1700]  Labs:  Recent Labs  02/22/14 0418  02/23/14 1745 02/24/14 0200 02/24/14 0221 02/24/14 0949  WBC 16.9*  --   --   --   --   --   HGB 7.7*  < > 9.0* 8.8*  --  9.3*  PLT 118*  --   --   --   --   --   CREATININE 0.84  --   --   --  0.83  --   < > = values in this interval not displayed. Estimated Creatinine Clearance: 100.2 mL/min (by C-G formula based on Cr of 0.83).    Medical History: Past Medical History  Diagnosis Date  . Hypertension   . Cerebral embolism   . Malignant neoplasm of colon 12/30/2013   Microbiology: 1/13 blood x2: 2 of 2 sets growing group B strep: S to amp, erythro, levo, PCN, vanco (I to clinda) 1/13 urine: NGF 1/12 MRSA PCR screen: negative  Anti-infectives: 1/13 >> Levaquin >> 1/15 1/14 >> vancomycin >>1/17 1/17 >> cefazolin >>  Assessment: 59 y/o M with metastatic colon cancer admitted with GI bleeding, now with Group B Strep bacteremia.  Currently receiving vancomycin but isolate is PCN-susceptible so a first-generation cephalosporin should cover.  Patient has hx of PCN allergy in childhood but did receive cefazolin in hospital x 1 dose in November 2015 for a procedure.  Recommended de-escalation of regimen from vancomycin to cefazolin and received MD orders for same.  Goal of Therapy:  Appropriate antibiotic dosing  for indication and renal function; eradication of infection.   Plan:  1. DC vancomycin 2. Begin cefazolin 2 grams IV q8h 3. Follow renal function, clinical course.  Clayburn Pert, PharmD, BCPS Pager: [phone] 02/24/2014  2:37 PM

## 2014-02-25 ENCOUNTER — Other Ambulatory Visit: Payer: No Typology Code available for payment source

## 2014-02-25 ENCOUNTER — Ambulatory Visit: Payer: No Typology Code available for payment source

## 2014-02-25 ENCOUNTER — Ambulatory Visit: Payer: No Typology Code available for payment source | Admitting: Family

## 2014-02-25 ENCOUNTER — Other Ambulatory Visit: Payer: No Typology Code available for payment source | Admitting: Lab

## 2014-02-25 DIAGNOSIS — G479 Sleep disorder, unspecified: Secondary | ICD-10-CM

## 2014-02-25 DIAGNOSIS — B955 Unspecified streptococcus as the cause of diseases classified elsewhere: Secondary | ICD-10-CM | POA: Diagnosis present

## 2014-02-25 DIAGNOSIS — B951 Streptococcus, group B, as the cause of diseases classified elsewhere: Secondary | ICD-10-CM

## 2014-02-25 DIAGNOSIS — I422 Other hypertrophic cardiomyopathy: Secondary | ICD-10-CM

## 2014-02-25 DIAGNOSIS — R7881 Bacteremia: Secondary | ICD-10-CM

## 2014-02-25 LAB — CBC
HCT: 27.3 % — ABNORMAL LOW (ref 39.0–52.0)
HEMOGLOBIN: 8.6 g/dL — AB (ref 13.0–17.0)
MCH: 29.3 pg (ref 26.0–34.0)
MCHC: 31.5 g/dL (ref 30.0–36.0)
MCV: 92.9 fL (ref 78.0–100.0)
Platelets: 189 10*3/uL (ref 150–400)
RBC: 2.94 MIL/uL — ABNORMAL LOW (ref 4.22–5.81)
RDW: 19.9 % — ABNORMAL HIGH (ref 11.5–15.5)
WBC: 19.4 10*3/uL — ABNORMAL HIGH (ref 4.0–10.5)

## 2014-02-25 MED ORDER — TRAZODONE HCL 50 MG PO TABS
75.0000 mg | ORAL_TABLET | Freq: Every day | ORAL | Status: DC
  Administered 2014-02-25 – 2014-03-09 (×13): 75 mg via ORAL
  Filled 2014-02-25 (×16): qty 1

## 2014-02-25 MED ORDER — CEFTRIAXONE SODIUM IN DEXTROSE 40 MG/ML IV SOLN
2.0000 g | INTRAVENOUS | Status: DC
  Administered 2014-02-25: 2 g via INTRAVENOUS
  Filled 2014-02-25: qty 50

## 2014-02-25 MED ORDER — SODIUM CHLORIDE 0.9 % IJ SOLN
10.0000 mL | INTRAMUSCULAR | Status: DC | PRN
  Administered 2014-02-25 – 2014-03-01 (×3): 10 mL
  Administered 2014-03-04: 20 mL
  Administered 2014-03-06: 10 mL
  Filled 2014-02-25 (×5): qty 40

## 2014-02-25 NOTE — Evaluation (Signed)
Physical Therapy Evaluation Patient Details Name: [name] MRN: [id number] DOB: 1955-12-24 Today's Date: 02/25/2014   History of Present Illness  [name] is a 59 y.o. male with Past medical history of colonic stent placement and a Port-A-Cath placement for metastatic colon cancer, ongoing with chemotherapy. Admitted with BRBPR.   Clinical Impression  *Pt admitted with above diagnosis. Pt currently with functional limitations due to the deficits listed below (see PT Problem List). * Pt will benefit from skilled PT to increase their independence and safety with mobility to allow discharge to the venue listed below.    Pt ambulated 270' without assistive device, with mild unsteadiness most likely due to deconditioning and anemia. Will follow to progress mobility as tolerated, likely no follow up PT indicated. Pt stated he is not sure where he plans to stay upon DC. He'd been staying with ex-wife but doesn't want to return there.  **    Follow Up Recommendations No PT follow up    Equipment Recommendations  None recommended by PT    Recommendations for Other Services       Precautions / Restrictions Precautions Precautions: None Restrictions Weight Bearing Restrictions: No      Mobility  Bed Mobility Overal bed mobility: Independent                Transfers Overall transfer level: Independent                  Ambulation/Gait Ambulation/Gait assistance: Min guard Ambulation Distance (Feet): 270 Feet Assistive device: None Gait Pattern/deviations: Step-through pattern   Gait velocity interpretation: Below normal speed for age/gender General Gait Details: occassional mild unsteadiness, but no LOB, HR 104, SaO2 100% on RA while walking; 2/4 dyspnea with walking  Stairs            Wheelchair Mobility    Modified Rankin (Stroke Patients Only)       Balance                                             Pertinent Vitals/Pain  Pain Assessment: No/denies pain    Home Living Family/patient expects to be discharged to:: Unsure                 Additional Comments: was staying with ex-wife PTA, but isn't sure where he'll DC to    Prior Function Level of Independence: Independent               Hand Dominance   Dominant Hand: Right    Extremity/Trunk Assessment   Upper Extremity Assessment: Overall WFL for tasks assessed           Lower Extremity Assessment: Overall WFL for tasks assessed      Cervical / Trunk Assessment: Normal  Communication   Communication: No difficulties  Cognition Arousal/Alertness: Awake/alert Behavior During Therapy: WFL for tasks assessed/performed Overall Cognitive Status: Within Functional Limits for tasks assessed                      General Comments      Exercises        Assessment/Plan    PT Assessment Patient needs continued PT services  PT Diagnosis Generalized weakness   PT Problem List Decreased activity tolerance;Decreased mobility  PT Treatment Interventions Gait training;Stair training;Therapeutic exercise   PT Goals (Current goals can be found  in the Care Plan section) Acute Rehab PT Goals Patient Stated Goal: just to understand what's happening with the cancer, wants to visit his mother in [place] PT Goal Formulation: With patient Time For Goal Achievement: 03/11/14 Potential to Achieve Goals: Good    Frequency Min 3X/week   Barriers to discharge        Co-evaluation               End of Session Equipment Utilized During Treatment: Gait belt Activity Tolerance: Patient tolerated treatment well Patient left: in chair;with call bell/phone within reach Nurse Communication: Mobility status         Time: 8413-2440 PT Time Calculation (min) (ACUTE ONLY): 20 min   Charges:   PT Evaluation $Initial PT Evaluation Tier I: 1 Procedure PT Treatments $Gait Training: 8-22 mins   PT G Codes:        Philomena Doheny 02/25/2014, 3:06 PM [phone]

## 2014-02-25 NOTE — Progress Notes (Signed)
Received from [facility], agree with previous RN's assessment.

## 2014-02-25 NOTE — Progress Notes (Signed)
Dr. Wendee Beavers paged at 0800 in regards to tele order.  Re-paged at 1600 in regards to tele order.  Dr. Wendee Beavers reinforced the need for tele.  Pt transferred to 1413 via wheelchair in stable condition.

## 2014-02-25 NOTE — Consult Note (Signed)
[place] for Infectious Disease    Date of Admission:  02/18/2014   Total days of antibiotics 6        Day 2 cefazolin               Reason for Consult: Group B streptococcal bacteremia    Referring Physician: Dr. Velvet Bathe  Principal Problem:   Streptococcal bacteremia   .  ceFAZolin (ANCEF) IV  2 g Intravenous Q8H  . dronabinol  5 mg Oral TID AC  . feeding supplement (ENSURE COMPLETE)  237 mL Oral BID BM  . folic acid  1 mg Oral Daily  . lactose free nutrition  237 mL Oral TID WC  . pantoprazole (PROTONIX) IV  40 mg Intravenous Q12H  . pravastatin  20 mg Oral q1800  . saccharomyces boulardii  250 mg Oral BID  . sodium chloride  3 mL Intravenous Q12H  . traZODone  75 mg Oral QHS    Recommendations: 1. Repeat blood cultures 2. Simplify cefazolin to once daily ceftriaxone   Assessment: [name] has group B streptococcal bacteremia of uncertain source. It is possible that the Port-A-Cath could be the source but there is no evidence of superficial infection of the port and would absolutely require removal. I will repeat blood cultures today and consolidating simplify antibiotic therapy to once daily ceftriaxone. He will need a minimum of 2 weeks of antibiotic therapy. I suspect that his murmur is old and related to his aortic graft. He has no evidence of endocarditis by TTE. I doubt an intra-abdominal source for his infection. I will follow-up tomorrow.   HPI: [name] is a 59 y.o. 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 He had a Port-A-Cath placed and has undergone chemotherapy. He was admitted with a lower GI bleed on 02/19/2014. Although he was not febrile blood cultures were obtained in both sets are growing group B strep. He has not had any fever, chills or sweats. He has had no problems with his Port-A-Cath. He's not had any significant abdominal pain. 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   Review of Systems: Constitutional: positive for malaise and weight loss, negative for chills, fevers and sweats Eyes: negative Ears, nose, mouth, throat, and face: negative Respiratory: negative Cardiovascular: negative Gastrointestinal: negative Genitourinary:negative  Past Medical History  Diagnosis Date  . Hypertension   . Cerebral embolism   . Malignant neoplasm of colon 12/30/2013    History  Substance Use Topics  . Smoking status: Never Smoker   . Smokeless tobacco: Never Used  . Alcohol Use: Yes    History reviewed. No pertinent family history. Allergies  Allergen Reactions  . Penicillins     childhood    OBJECTIVE: Blood pressure 106/83, pulse 89, temperature 98.9 F (37.2 C), temperature source Oral, resp. rate 18, height 5\' 10"  (1.778 m), weight 161 lb 13.1 oz (73.4 kg), SpO2 100 %. General: He is alert and in no distress Skin: No rash, splinter or conjunctival hemorrhages Lungs: Clear Cor: Regular S1 and S2 with a 3/6 holosystolic murmur heard across his precordium Chest: Port-A-Cath site appears normal Abdomen: Soft and nontender  Lab Results Lab Results  Component Value Date   WBC 19.4* 02/25/2014   HGB 8.6* 02/25/2014   HCT 27.3* 02/25/2014   MCV 92.9 02/25/2014   PLT 189 02/25/2014  Lab Results  Component Value Date   CREATININE 0.83 02/24/2014   BUN 16 02/24/2014   NA 133* 02/24/2014   K 4.8 02/24/2014   CL 96 02/24/2014   CO2 30 02/24/2014    Lab Results  Component Value Date   ALT 11 02/19/2014   AST 29 02/19/2014   ALKPHOS 141* 02/19/2014   BILITOT 0.8 02/19/2014     Microbiology: Recent Results (from the past 240 hour(s))  MRSA PCR Screening     Status: None   Collection Time: 02/19/14 12:52 AM  Result Value Ref Range Status   MRSA by PCR NEGATIVE NEGATIVE Final    Comment:        The GeneXpert MRSA Assay (FDA approved for NASAL specimens only), is one component of  a comprehensive MRSA colonization surveillance program. It is not intended to diagnose MRSA infection nor to guide or monitor treatment for MRSA infections.   Culture, blood (routine x 2)     Status: None   Collection Time: 02/20/14  5:42 PM  Result Value Ref Range Status   Specimen Description BLOOD RIGHT ARM  Final   Special Requests BOTTLES DRAWN AEROBIC AND ANAEROBIC 10CC EACH  Final   Culture   Final    GROUP B STREP(S.AGALACTIAE)ISOLATED Note: SUSCEPTIBILITIES PERFORMED ON PREVIOUS CULTURE WITHIN THE LAST 5 DAYS. Note: Gram Stain Report Called to,Read Back By and Verified With: SUSAN@12 :50PM ON 02/21/14 BY DANTS Performed at [facility]    Report Status 02/23/2014 FINAL  Final  Culture, Urine     Status: None   Collection Time: 02/20/14  6:04 PM  Result Value Ref Range Status   Specimen Description URINE, CLEAN CATCH  Final   Special Requests NONE  Final   Colony Count NO GROWTH Performed at [facility]   Final   Culture NO GROWTH Performed at [facility]   Final   Report Status 02/21/2014 FINAL  Final  Culture, blood (routine x 2)     Status: None   Collection Time: 02/20/14  6:40 PM  Result Value Ref Range Status   Specimen Description BLOOD RIGHT ARM  Final   Special Requests BOTTLES DRAWN AEROBIC AND ANAEROBIC 10CC EACH  Final   Culture   Final    GROUP B STREP(S.AGALACTIAE)ISOLATED Note: Gram Stain Report Called to,Read Back By and Verified With: SUSAN@12 :50PM ON 02/21/14 BY DANTS Performed at [facility]    Report Status 02/23/2014 FINAL  Final   Organism ID, Bacteria GROUP B STREP(S.AGALACTIAE)ISOLATED  Final      Susceptibility   Group b strep(s.agalactiae)isolated - MIC*    AMPICILLIN <=0.25 SENSITIVE Sensitive     PENICILLIN <=0.06 SENSITIVE Sensitive     CLINDAMYCIN 0.5 INTERMEDIATE Intermediate     ERYTHROMYCIN <=0.12 SENSITIVE Sensitive     VANCOMYCIN 0.5 SENSITIVE Sensitive     LEVOFLOXACIN 1 SENSITIVE Sensitive      * GROUP B STREP(S.AGALACTIAE)ISOLATED   Transthoracic echocardiogram 02/22/2014 Study Conclusions  - Left ventricle: The cavity size was normal. Wall thickness was increased in a pattern of severe LVH. 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 Systolic function was vigorous. The estimated ejection fraction was in the range of 65% to 70%. Wall motion was normal; there were no regional wall motion abnormalities. Doppler parameters are consistent with abnormal left ventricular relaxation (grade 1 diastolic dysfunction). Doppler parameters are consistent with high ventricular filling pressure. - Mitral valve: There was moderate systolic anterior motion of the chordal structures. There was mild  regurgitation. - Left atrium: The atrium was severely dilated.  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  Kirk Ruths 2016-01-15T14:00:00  Michel Bickers, MD [place] [phone] pager   [phone] cell 02/25/2014, 4:54 PM

## 2014-02-25 NOTE — Progress Notes (Signed)
[facility] PROGRESS NOTE  Wil Slape PFX:[id number] DOB: 1955-09-30 DOA: 02/18/2014 PCP: No primary care provider on file.  Assessment/Plan:  Bacteremia - Growing streptococcus agalactiae  - ID consulted - currently on Rocephin  Leukocytosis - most likely due to bacteremia  Principal Problem:    BRBPR (bright red blood per rectum) - HGB steady near or at 9 - reassess next am. - monitor on telemetry  Active Problems:   Cerebral embolism with cerebral infarction - stable no new neurological deficits reported. - aspirin on hold due to GI bleed.    Hypokalemia - resolved    Acute GI bleeding - GI on board and patient is s/p flexible sigmoidoscopy with friable areas of the tumor obliterated and cauterized utilizing the argon plasma coagulator - aspirin on hold    Decreased appetite - Currently on Boost and on bland diet.    Weakness generalized - Once hypotension fully resolved will plan on having PT evaluate and work with patient.    Palliative care encounter - Currently on board and will assist with planning  Code Status:  Family Communication:  Disposition Plan:    Consultants:  GI  Palliative  ID on board  Procedures:  As listed above  Antibiotics:  Levaquin  Vancomycin  HPI/Subjective: Pt has no new complaints.   Objective: Filed Vitals:   02/25/14 1721  BP: 115/85  Pulse: 96  Temp: 98.6 F (37 C)  Resp: 18    Intake/Output Summary (Last 24 hours) at 02/25/14 1723 Last data filed at 02/25/14 1400  Gross per 24 hour  Intake   3040 ml  Output   3500 ml  Net   -460 ml   Filed Weights   02/23/14 0422 02/24/14 0400 02/25/14 0437  Weight: 74.9 kg (165 lb 2 oz) 73.3 kg (161 lb 9.6 oz) 73.4 kg (161 lb 13.1 oz)    Exam:   General:  Pt in NAD, alert and awake  Cardiovascular: rrr, no murmurs  Respiratory: cta bl, no wheezes  Abdomen: + bowel sounds, soft, NT  Musculoskeletal: no cyanosis or clubbing   Data  Reviewed: Basic Metabolic Panel:  Recent Labs Lab 02/18/14 2201 02/18/14 2202 02/19/14 1345 02/21/14 0430 02/22/14 0418 02/24/14 0221  NA  --  133* 133* 133* 131* 133*  K  --  2.6* 4.0 3.7 4.3 4.8  CL  --  101 104 101 102 96  CO2  --  25 22 26 26 30   GLUCOSE  --  127* 104* 98 114* 111*  BUN  --  18 19 14 15 16   CREATININE  --  1.09 1.17 0.96 0.84 0.83  CALCIUM  --  7.9* 7.4* 7.5* 7.2* 7.0*  MG 1.9  --   --   --  1.3*  --   PHOS  --   --   --   --  1.8*  --    Liver Function Tests:  Recent Labs Lab 02/18/14 2202 02/19/14 1345  AST 33 29  ALT 14 11  ALKPHOS 191* 141*  BILITOT 0.7 0.8  PROT 6.2 5.1*  ALBUMIN 2.6* 2.2*   No results for input(s): LIPASE, AMYLASE in the last 168 hours. No results for input(s): AMMONIA in the last 168 hours. CBC:  Recent Labs Lab 02/18/14 2202 02/19/14 1345  02/21/14 0430  02/22/14 0418  02/23/14 1020 02/23/14 1745 02/24/14 0200 02/24/14 0949 02/25/14 0428  WBC 10.0 12.9*  --  22.5*  --  16.9*  --   --   --   --   --  19.4*  NEUTROABS  --  9.2*  --   --   --   --   --   --   --   --   --   --   HGB 8.6* 7.4*  < > 8.0*  < > 7.7*  < > 8.9* 9.0* 8.8* 9.3* 8.6*  HCT 25.8* 21.7*  < > 23.8*  < > 23.4*  < > 27.0* 27.1* 27.1* 29.1* 27.3*  MCV 85.7 83.8  --  87.5  --  89.3  --   --   --   --   --  92.9  PLT 233 186  --  116*  --  118*  --   --   --   --   --  189  < > = values in this interval not displayed. Cardiac Enzymes:  Recent Labs Lab 02/21/14 1218 02/21/14 2020 02/22/14 0418  TROPONINI CRITICAL RESULT CALLED TO, READ BACK BY AND VERIFIED WITH: 0.73* 0.51*   BNP (last 3 results) No results for input(s): PROBNP in the last 8760 hours. CBG: No results for input(s): GLUCAP in the last 168 hours.  Recent Results (from the past 240 hour(s))  MRSA PCR Screening     Status: None   Collection Time: 02/19/14 12:52 AM  Result Value Ref Range Status   MRSA by PCR NEGATIVE NEGATIVE Final    Comment:        The GeneXpert MRSA  Assay (FDA approved for NASAL specimens only), is one component of a comprehensive MRSA colonization surveillance program. It is not intended to diagnose MRSA infection nor to guide or monitor treatment for MRSA infections.   Culture, blood (routine x 2)     Status: None   Collection Time: 02/20/14  5:42 PM  Result Value Ref Range Status   Specimen Description BLOOD RIGHT ARM  Final   Special Requests BOTTLES DRAWN AEROBIC AND ANAEROBIC 10CC EACH  Final   Culture   Final    GROUP B STREP(S.AGALACTIAE)ISOLATED Note: SUSCEPTIBILITIES PERFORMED ON PREVIOUS CULTURE WITHIN THE LAST 5 DAYS. Note: Gram Stain Report Called to,Read Back By and Verified With: SUSAN@12 :50PM ON 02/21/14 BY DANTS Performed at [facility]    Report Status 02/23/2014 FINAL  Final  Culture, Urine     Status: None   Collection Time: 02/20/14  6:04 PM  Result Value Ref Range Status   Specimen Description URINE, CLEAN CATCH  Final   Special Requests NONE  Final   Colony Count NO GROWTH Performed at [facility]   Final   Culture NO GROWTH Performed at [facility]   Final   Report Status 02/21/2014 FINAL  Final  Culture, blood (routine x 2)     Status: None   Collection Time: 02/20/14  6:40 PM  Result Value Ref Range Status   Specimen Description BLOOD RIGHT ARM  Final   Special Requests BOTTLES DRAWN AEROBIC AND ANAEROBIC 10CC EACH  Final   Culture   Final    GROUP B STREP(S.AGALACTIAE)ISOLATED Note: Gram Stain Report Called to,Read Back By and Verified With: SUSAN@12 :67EH ON 02/21/14 BY DANTS Performed at [facility]    Report Status 02/23/2014 FINAL  Final   Organism ID, Bacteria GROUP B STREP(S.AGALACTIAE)ISOLATED  Final      Susceptibility   Group b strep(s.agalactiae)isolated - MIC*    AMPICILLIN <=0.25 SENSITIVE Sensitive     PENICILLIN <=0.06 SENSITIVE Sensitive     CLINDAMYCIN 0.5 INTERMEDIATE Intermediate  ERYTHROMYCIN <=0.12 SENSITIVE Sensitive      VANCOMYCIN 0.5 SENSITIVE Sensitive     LEVOFLOXACIN 1 SENSITIVE Sensitive     * GROUP B STREP(S.AGALACTIAE)ISOLATED     Studies: No results found.  Scheduled Meds: . cefTRIAXone (ROCEPHIN)  IV  2 g Intravenous Q24H  . dronabinol  5 mg Oral TID AC  . feeding supplement (ENSURE COMPLETE)  237 mL Oral BID BM  . folic acid  1 mg Oral Daily  . lactose free nutrition  237 mL Oral TID WC  . pantoprazole (PROTONIX) IV  40 mg Intravenous Q12H  . pravastatin  20 mg Oral q1800  . saccharomyces boulardii  250 mg Oral BID  . sodium chloride  3 mL Intravenous Q12H  . traZODone  75 mg Oral QHS   Continuous Infusions: . 0.9 % NaCl with KCl 40 mEq / L 100 mL/hr (02/24/14 1958)     Time spent: > 35 minutes    Velvet Bathe  [facility] Pager [phone] If 7PM-7AM, please contact night-coverage at [email or web address], password [facility] 02/25/2014, 5:23 PM  LOS: 7 days

## 2014-02-25 NOTE — Progress Notes (Addendum)
Progress Note from the Palliative Medicine Team at [place]: I had a long discussion with [name] and his ex-wife via telephone who believe he has "given up." However, [name] continues to tell me that he wants to be full code and continue treatment as long as he is able. He is concerned about his upcoming chemo scheduled. He is depressed but this is also situational as he is young in a terrible situation in which he has no control - I explained this too his ex-wife. He is very frustrated that he is still here in the hospital and we discussed the reasons he has had to stay (bleeding, SOB, bacteremia) and be monitored closely. He is also sad that he has to stay here for his treatment but he wishes to go to [place] to be with his mother (but would not have access to medical care there). I provided support and listening to [name].     Objective: Allergies  Allergen Reactions  . Penicillins     childhood   Scheduled Meds: .  ceFAZolin (ANCEF) IV  2 g Intravenous Q8H  . dronabinol  5 mg Oral TID AC  . feeding supplement (ENSURE COMPLETE)  237 mL Oral BID BM  . folic acid  1 mg Oral Daily  . lactose free nutrition  237 mL Oral TID WC  . pantoprazole (PROTONIX) IV  40 mg Intravenous Q12H  . pravastatin  20 mg Oral q1800  . saccharomyces boulardii  250 mg Oral BID  . sodium chloride  3 mL Intravenous Q12H  . traZODone  50 mg Oral QHS   Continuous Infusions: . 0.9 % NaCl with KCl 40 mEq / L 100 mL/hr (02/24/14 1958)   PRN Meds:.acetaminophen **OR** acetaminophen, ondansetron **OR** ondansetron (ZOFRAN) IV, oxyCODONE, sodium chloride, traZODone  BP 96/70 mmHg  Pulse 95  Temp(Src) 98.3 F (36.8 C) (Oral)  Resp 18  Ht 5\' 10"  (1.778 m)  Wt 73.4 kg (161 lb 13.1 oz)  BMI 23.22 kg/m2  SpO2 100%   PPS: 30% at best   Intake/Output Summary (Last 24 hours) at 02/25/14 1334 Last data filed at 02/25/14 1000  Gross per 24 hour  Intake   2970 ml  Output   3525 ml  Net    -555 ml      LBM: 02/24/13  Physical Exam:  General: NAD, lying in bed HEENT: [place]/AT, no JVD, moist mucous membranes Chest: Shortness of breath appears improved, symmetric CVS: RRR Abdomen: Soft, NT, ND, +BS Ext: MAE, no edema, warm to touch Neuro: Awake, alert, oriented x 3    Labs: CBC    Component Value Date/Time   WBC 19.4* 02/25/2014 0428   WBC 12.1* 02/13/2014 0916   WBC 10.4* 01/22/2014 0848   RBC 2.94* 02/25/2014 0428   RBC 3.87* 02/13/2014 0916   RBC 4.04* 01/22/2014 0848   HGB 8.6* 02/25/2014 0428   HGB 11.4* 02/13/2014 0916   HGB 11.5* 01/22/2014 0848   HCT 27.3* 02/25/2014 0428   HCT 33.5* 02/13/2014 0916   HCT 34.6* 01/22/2014 0848   PLT 189 02/25/2014 0428   PLT 295 02/13/2014 0916   PLT 350 01/22/2014 0848   MCV 92.9 02/25/2014 0428   MCV 86.6 02/13/2014 0916   MCV 86 01/22/2014 0848   MCH 29.3 02/25/2014 0428   MCH 29.5 02/13/2014 0916   MCH 28.5 01/22/2014 0848   MCHC 31.5 02/25/2014 0428   MCHC 34.0 02/13/2014 0916   MCHC 33.2 01/22/2014 0848  RDW 19.9* 02/25/2014 0428   RDW 19.8* 02/13/2014 0916   RDW 19.8* 01/22/2014 0848   LYMPHSABS 2.7 02/19/2014 1345   LYMPHSABS 1.1 02/13/2014 0916   LYMPHSABS 1.7 01/22/2014 0848   MONOABS 1.0 02/19/2014 1345   MONOABS 3.1* 02/13/2014 0916   EOSABS 0.0 02/19/2014 1345   EOSABS 0.3 02/13/2014 0916   EOSABS 0.3 01/22/2014 0848   BASOSABS 0.0 02/19/2014 1345   BASOSABS 0.0 02/13/2014 0916   BASOSABS 0.0 01/22/2014 0848    BMET    Component Value Date/Time   NA 133* 02/24/2014 0221   NA 136 02/13/2014 0917   NA 137 01/22/2014 0848   K 4.8 02/24/2014 0221   K 3.5 02/13/2014 0917   K 3.6 01/22/2014 0848   CL 96 02/24/2014 0221   CL 96* 01/22/2014 0848   CO2 30 02/24/2014 0221   CO2 26 02/13/2014 0917   CO2 25 01/22/2014 0848   GLUCOSE 111* 02/24/2014 0221   GLUCOSE 90 02/13/2014 0917   GLUCOSE 144* 01/22/2014 0848   BUN 16 02/24/2014 0221   BUN 17.3 02/13/2014 0917   BUN 14 01/22/2014 0848    CREATININE 0.83 02/24/2014 0221   CREATININE 1.0 02/13/2014 0917   CREATININE 1.3* 01/22/2014 0848   CALCIUM 7.0* 02/24/2014 0221   CALCIUM 8.9 02/13/2014 0917   CALCIUM 8.8 01/22/2014 0848   GFRNONAA >90 02/24/2014 0221   GFRAA >90 02/24/2014 0221    CMP     Component Value Date/Time   NA 133* 02/24/2014 0221   NA 136 02/13/2014 0917   NA 137 01/22/2014 0848   K 4.8 02/24/2014 0221   K 3.5 02/13/2014 0917   K 3.6 01/22/2014 0848   CL 96 02/24/2014 0221   CL 96* 01/22/2014 0848   CO2 30 02/24/2014 0221   CO2 26 02/13/2014 0917   CO2 25 01/22/2014 0848   GLUCOSE 111* 02/24/2014 0221   GLUCOSE 90 02/13/2014 0917   GLUCOSE 144* 01/22/2014 0848   BUN 16 02/24/2014 0221   BUN 17.3 02/13/2014 0917   BUN 14 01/22/2014 0848   CREATININE 0.83 02/24/2014 0221   CREATININE 1.0 02/13/2014 0917   CREATININE 1.3* 01/22/2014 0848   CALCIUM 7.0* 02/24/2014 0221   CALCIUM 8.9 02/13/2014 0917   CALCIUM 8.8 01/22/2014 0848   PROT 5.1* 02/19/2014 1345   PROT 7.3 02/13/2014 0917   PROT 7.5 01/22/2014 0848   ALBUMIN 2.2* 02/19/2014 1345   ALBUMIN 2.5* 02/13/2014 0917   AST 29 02/19/2014 1345   AST 37* 02/13/2014 0917   AST 64* 01/22/2014 0848   ALT 11 02/19/2014 1345   ALT 14 02/13/2014 0917   ALT 17 01/22/2014 0848   ALKPHOS 141* 02/19/2014 1345   ALKPHOS 306* 02/13/2014 0917   ALKPHOS 261* 01/22/2014 0848   BILITOT 0.8 02/19/2014 1345   BILITOT 0.24 02/13/2014 0917   BILITOT 0.50 01/22/2014 0848   GFRNONAA >90 02/24/2014 0221   GFRAA >90 02/24/2014 0221    Assessment and Plan: 1. Code Status: FULL 2. Symptom Control: 1. Sleep: Increase trazodone 75 mg qhs. He says that the 50 mg does not help (wasn't helping much at home even). He is up until 2-3 am and sleepy all day.  2. Depression: He is tearful and feels hopelessness. He generally says when asked how he is "I'm alive" - like this is as good as it is for him. May see some benefit from increase trazodone. Could consider  trial of Celexa as well. However this is very  much situational.  3. Weakness: PT to eval for rehab and support to increase independence.  3. Psycho/Social: Emotional support provided to patient and to his ex-wife via telephone at request of patient.  4. Disposition: To be determined. Home with home health vs short term rehab.     Time In Time Out Total Time Spent with Patient Total Overall Time  1300 1345 25min 52min    Greater than 50%  of this time was spent counseling and coordinating care related to the above assessment and plan.  Vinie Sill, NP Palliative Medicine Team Pager # [phone] (M-F 8a-5p) Team Phone # [phone] (Nights/Weekends)

## 2014-02-26 DIAGNOSIS — B954 Other streptococcus as the cause of diseases classified elsewhere: Secondary | ICD-10-CM

## 2014-02-26 LAB — CBC
HCT: 29.5 % — ABNORMAL LOW (ref 39.0–52.0)
Hemoglobin: 9.3 g/dL — ABNORMAL LOW (ref 13.0–17.0)
MCH: 29.2 pg (ref 26.0–34.0)
MCHC: 31.5 g/dL (ref 30.0–36.0)
MCV: 92.8 fL (ref 78.0–100.0)
Platelets: 238 10*3/uL (ref 150–400)
RBC: 3.18 MIL/uL — ABNORMAL LOW (ref 4.22–5.81)
RDW: 19.7 % — ABNORMAL HIGH (ref 11.5–15.5)
WBC: 16.7 10*3/uL — AB (ref 4.0–10.5)

## 2014-02-26 MED ORDER — PANTOPRAZOLE SODIUM 40 MG PO TBEC
40.0000 mg | DELAYED_RELEASE_TABLET | Freq: Two times a day (BID) | ORAL | Status: DC
  Administered 2014-02-26 – 2014-03-10 (×23): 40 mg via ORAL
  Filled 2014-02-26 (×29): qty 1

## 2014-02-26 MED ORDER — CEFAZOLIN 5 MG/ML + HEPARIN 2500 UNITS ABX LOCK SOLN
2.5000 mL | Status: DC
  Administered 2014-02-26 – 2014-03-06 (×9): 2.5 mL
  Filled 2014-02-26 (×10): qty 2.5

## 2014-02-26 MED ORDER — CEFTRIAXONE SODIUM IN DEXTROSE 40 MG/ML IV SOLN
2.0000 g | INTRAVENOUS | Status: DC
  Administered 2014-02-26 – 2014-03-07 (×11): 2 g via INTRAVENOUS
  Filled 2014-02-26 (×12): qty 50

## 2014-02-26 NOTE — Progress Notes (Signed)
Patient ID: [name], male   DOB: [birth year], 59 y.o.   MRN: [id number]         [facility] Infectious Disease    Date of Admission:  02/18/2014   Total days of antibiotics 7        Day 1 ceftriaxone         Principal Problem:   Streptococcal bacteremia   . cefazolin 5 mg/ml + heparin 2500 units/ml  2.5 mL Intracatheter Q24H  . cefTRIAXone (ROCEPHIN)  IV  2 g Intravenous Q24H  . dronabinol  5 mg Oral TID AC  . feeding supplement (ENSURE COMPLETE)  237 mL Oral BID BM  . folic acid  1 mg Oral Daily  . lactose free nutrition  237 mL Oral TID WC  . pantoprazole (PROTONIX) IV  40 mg Intravenous Q12H  . pravastatin  20 mg Oral q1800  . saccharomyces boulardii  250 mg Oral BID  . sodium chloride  3 mL Intravenous Q12H  . traZODone  75 mg Oral QHS    Subjective: He states that he's feeling better than normal.  Review of Systems: Pertinent items are noted in HPI.  Past Medical History  Diagnosis Date  . Hypertension   . Cerebral embolism   . Malignant neoplasm of colon 12/30/2013    History  Substance Use Topics  . Smoking status: Never Smoker   . Smokeless tobacco: Never Used  . Alcohol Use: Yes    History reviewed. No pertinent family history. Allergies  Allergen Reactions  . Penicillins     childhood    OBJECTIVE: Blood pressure 128/77, pulse 88, temperature 98.9 F (37.2 C), temperature source Oral, resp. rate 18, height 5\' 10"  (1.778 m), weight 161 lb (73.029 kg), SpO2 100 %.  General: He is sleeping but arouses easily. He is in no distress Skin: No rash Lungs: Clear Cor: Regular S1 and S2 with a 3/6 systolic murmur Port-A-Cath site: Normal Abdomen: Soft and nontender  Lab Results Lab Results  Component Value Date   WBC 16.7* 02/26/2014   HGB 9.3* 02/26/2014   HCT 29.5* 02/26/2014   MCV 92.8 02/26/2014   PLT 238 02/26/2014    Lab Results  Component Value Date   CREATININE 0.83 02/24/2014   BUN 16 02/24/2014   NA 133* 02/24/2014   K  4.8 02/24/2014   CL 96 02/24/2014   CO2 30 02/24/2014    Lab Results  Component Value Date   ALT 11 02/19/2014   AST 29 02/19/2014   ALKPHOS 141* 02/19/2014   BILITOT 0.8 02/19/2014     Microbiology: Recent Results (from the past 240 hour(s))  MRSA PCR Screening     Status: None   Collection Time: 02/19/14 12:52 AM  Result Value Ref Range Status   MRSA by PCR NEGATIVE NEGATIVE Final    Comment:        The GeneXpert MRSA Assay (FDA approved for NASAL specimens only), is one component of a comprehensive MRSA colonization surveillance program. It is not intended to diagnose MRSA infection nor to guide or monitor treatment for MRSA infections.   Culture, blood (routine x 2)     Status: None   Collection Time: 02/20/14  5:42 PM  Result Value Ref Range Status   Specimen Description BLOOD RIGHT ARM  Final   Special Requests BOTTLES DRAWN AEROBIC AND ANAEROBIC 10CC EACH  Final   Culture   Final    GROUP B STREP(S.AGALACTIAE)ISOLATED Note: SUSCEPTIBILITIES PERFORMED ON PREVIOUS CULTURE WITHIN  THE LAST 5 DAYS. Note: Gram Stain Report Called to,Read Back By and Verified With: SUSAN@12 :50PM ON 02/21/14 BY DANTS Performed at [facility]    Report Status 02/23/2014 FINAL  Final  Culture, Urine     Status: None   Collection Time: 02/20/14  6:04 PM  Result Value Ref Range Status   Specimen Description URINE, CLEAN CATCH  Final   Special Requests NONE  Final   Colony Count NO GROWTH Performed at [facility]   Final   Culture NO GROWTH Performed at [facility]   Final   Report Status 02/21/2014 FINAL  Final  Culture, blood (routine x 2)     Status: None   Collection Time: 02/20/14  6:40 PM  Result Value Ref Range Status   Specimen Description BLOOD RIGHT ARM  Final   Special Requests BOTTLES DRAWN AEROBIC AND ANAEROBIC 10CC EACH  Final   Culture   Final    GROUP B STREP(S.AGALACTIAE)ISOLATED Note: Gram Stain Report Called to,Read Back By and  Verified With: SUSAN@12 :50PM ON 02/21/14 BY DANTS Performed at [facility]    Report Status 02/23/2014 FINAL  Final   Organism ID, Bacteria GROUP B STREP(S.AGALACTIAE)ISOLATED  Final      Susceptibility   Group b strep(s.agalactiae)isolated - MIC*    AMPICILLIN <=0.25 SENSITIVE Sensitive     PENICILLIN <=0.06 SENSITIVE Sensitive     CLINDAMYCIN 0.5 INTERMEDIATE Intermediate     ERYTHROMYCIN <=0.12 SENSITIVE Sensitive     VANCOMYCIN 0.5 SENSITIVE Sensitive     LEVOFLOXACIN 1 SENSITIVE Sensitive     * GROUP B STREP(S.AGALACTIAE)ISOLATED  Culture, blood (routine x 2)     Status: None (Preliminary result)   Collection Time: 02/25/14  5:50 PM  Result Value Ref Range Status   Specimen Description BLOOD RIGHT HAND  Final   Special Requests BOTTLES DRAWN AEROBIC ONLY 6CC  Final   Culture   Final           BLOOD CULTURE RECEIVED NO GROWTH TO DATE CULTURE WILL BE HELD FOR 5 DAYS BEFORE ISSUING A FINAL NEGATIVE REPORT Performed at [facility]    Report Status PENDING  Incomplete  Culture, blood (routine x 2)     Status: None (Preliminary result)   Collection Time: 02/25/14  6:00 PM  Result Value Ref Range Status   Specimen Description BLOOD RIGHT ARM  Final   Special Requests BOTTLES DRAWN AEROBIC ONLY 10CC  Final   Culture   Final           BLOOD CULTURE RECEIVED NO GROWTH TO DATE CULTURE WILL BE HELD FOR 5 DAYS BEFORE ISSUING A FINAL NEGATIVE REPORT Performed at [facility]    Report Status PENDING  Incomplete    Assessment: He had some low-grade fever overnight but he is clinically improved and his white blood cell count is coming down. Repeat blood cultures are negative at 24 hours. I think his murmur is unchanged. He had a 3/6 systolic murmur documented when he was hospitalized last November. This is probably related to his previous aortic surgery rather than an indication of endocarditis. His Port-A-Cath is not appear to be grossly infected. I will plan on  treating him with systemic ceftriaxone and cefazolin antibiotic lock therapy for at least one more week. I'm hopeful that this will cure his group B streptococcal bacteremia and salvage his Port-A-Cath. He tells me that he had been staying in the house that he shared with his ex-wife but  he is not sure how long he will be able to stay there. He states "I need to find some other place to live".  Plan: 1. Continue ceftriaxone  2. Start antibiotic lock therapy with a combination of cefazolin and heparin instilled into Port-A-Cath between doses of ceftriaxone  Michel Bickers, MD [facility] for [place] [phone] pager   [phone] cell 02/26/2014, 2:12 PM

## 2014-02-26 NOTE — Progress Notes (Signed)
[facility] PROGRESS NOTE  [name] WUJ:[id number] DOB: December 06, 1955 DOA: 02/18/2014 PCP: No primary care provider on file.  Assessment/Plan:  Bacteremia - Growing streptococcus agalactiae  - ID consulted and assisting with managment - currently on Rocephin  Leukocytosis - most likely due to bacteremia, trending down  Principal Problem:    BRBPR (bright red blood per rectum) - HGB steady near or at 9 - reassess next am. - monitor on telemetry  Active Problems:   Cerebral embolism with cerebral infarction - stable no new neurological deficits reported. - aspirin on hold due to GI bleed.    Hypokalemia - resolved    Acute GI bleeding - GI on board and patient is s/p flexible sigmoidoscopy with friable areas of the tumor obliterated and cauterized utilizing the argon plasma coagulator - aspirin on hold - reassess hgb levels next am.    Decreased appetite - Currently on Boost and on bland diet.    Weakness generalized - Once hypotension fully resolved will plan on having PT evaluate and work with patient.    Palliative care encounter - Currently on board and will assist with planning  Code Status:  Family Communication:  Disposition Plan:    Consultants:  GI  Palliative  ID on board  Procedures:  As listed above  Antibiotics:  Rocephin  HPI/Subjective: Pt has no new complaints. No acute issues overnight.  Objective: Filed Vitals:   02/26/14 1331  BP: 128/77  Pulse: 88  Temp: 98.9 F (37.2 C)  Resp: 18    Intake/Output Summary (Last 24 hours) at 02/26/14 1652 Last data filed at 02/26/14 1203  Gross per 24 hour  Intake   1190 ml  Output   2450 ml  Net  -1260 ml   Filed Weights   02/24/14 0400 02/25/14 0437 02/26/14 0457  Weight: 73.3 kg (161 lb 9.6 oz) 73.4 kg (161 lb 13.1 oz) 73.029 kg (161 lb)    Exam:   General:  Pt in NAD, alert and awake  Cardiovascular: rrr, no murmurs  Respiratory: cta bl, no wheezes  Abdomen: +  bowel sounds, soft, NT  Musculoskeletal: no cyanosis or clubbing   Data Reviewed: Basic Metabolic Panel:  Recent Labs Lab 02/21/14 0430 02/22/14 0418 02/24/14 0221  NA 133* 131* 133*  K 3.7 4.3 4.8  CL 101 102 96  CO2 26 26 30   GLUCOSE 98 114* 111*  BUN 14 15 16   CREATININE 0.96 0.84 0.83  CALCIUM 7.5* 7.2* 7.0*  MG  --  1.3*  --   PHOS  --  1.8*  --    Liver Function Tests: No results for input(s): AST, ALT, ALKPHOS, BILITOT, PROT, ALBUMIN in the last 168 hours. No results for input(s): LIPASE, AMYLASE in the last 168 hours. No results for input(s): AMMONIA in the last 168 hours. CBC:  Recent Labs Lab 02/21/14 0430  02/22/14 0418  02/23/14 1745 02/24/14 0200 02/24/14 0949 02/25/14 0428 02/26/14 0105  WBC 22.5*  --  16.9*  --   --   --   --  19.4* 16.7*  HGB 8.0*  < > 7.7*  < > 9.0* 8.8* 9.3* 8.6* 9.3*  HCT 23.8*  < > 23.4*  < > 27.1* 27.1* 29.1* 27.3* 29.5*  MCV 87.5  --  89.3  --   --   --   --  92.9 92.8  PLT 116*  --  118*  --   --   --   --  189 238  < > =  values in this interval not displayed. Cardiac Enzymes:  Recent Labs Lab 02/21/14 1218 02/21/14 2020 02/22/14 0418  TROPONINI CRITICAL RESULT CALLED TO, READ BACK BY AND VERIFIED WITH: 0.73* 0.51*   BNP (last 3 results) No results for input(s): PROBNP in the last 8760 hours. CBG: No results for input(s): GLUCAP in the last 168 hours.  Recent Results (from the past 240 hour(s))  MRSA PCR Screening     Status: None   Collection Time: 02/19/14 12:52 AM  Result Value Ref Range Status   MRSA by PCR NEGATIVE NEGATIVE Final    Comment:        The GeneXpert MRSA Assay (FDA approved for NASAL specimens only), is one component of a comprehensive MRSA colonization surveillance program. It is not intended to diagnose MRSA infection nor to guide or monitor treatment for MRSA infections.   Culture, blood (routine x 2)     Status: None   Collection Time: 02/20/14  5:42 PM  Result Value Ref Range  Status   Specimen Description BLOOD RIGHT ARM  Final   Special Requests BOTTLES DRAWN AEROBIC AND ANAEROBIC 10CC EACH  Final   Culture   Final    GROUP B STREP(S.AGALACTIAE)ISOLATED Note: SUSCEPTIBILITIES PERFORMED ON PREVIOUS CULTURE WITHIN THE LAST 5 DAYS. Note: Gram Stain Report Called to,Read Back By and Verified With: SUSAN@12 :50PM ON 02/21/14 BY DANTS Performed at [facility]    Report Status 02/23/2014 FINAL  Final  Culture, Urine     Status: None   Collection Time: 02/20/14  6:04 PM  Result Value Ref Range Status   Specimen Description URINE, CLEAN CATCH  Final   Special Requests NONE  Final   Colony Count NO GROWTH Performed at [facility]   Final   Culture NO GROWTH Performed at [facility]   Final   Report Status 02/21/2014 FINAL  Final  Culture, blood (routine x 2)     Status: None   Collection Time: 02/20/14  6:40 PM  Result Value Ref Range Status   Specimen Description BLOOD RIGHT ARM  Final   Special Requests BOTTLES DRAWN AEROBIC AND ANAEROBIC 10CC EACH  Final   Culture   Final    GROUP B STREP(S.AGALACTIAE)ISOLATED Note: Gram Stain Report Called to,Read Back By and Verified With: SUSAN@12 :50PM ON 02/21/14 BY DANTS Performed at [facility]    Report Status 02/23/2014 FINAL  Final   Organism ID, Bacteria GROUP B STREP(S.AGALACTIAE)ISOLATED  Final      Susceptibility   Group b strep(s.agalactiae)isolated - MIC*    AMPICILLIN <=0.25 SENSITIVE Sensitive     PENICILLIN <=0.06 SENSITIVE Sensitive     CLINDAMYCIN 0.5 INTERMEDIATE Intermediate     ERYTHROMYCIN <=0.12 SENSITIVE Sensitive     VANCOMYCIN 0.5 SENSITIVE Sensitive     LEVOFLOXACIN 1 SENSITIVE Sensitive     * GROUP B STREP(S.AGALACTIAE)ISOLATED  Culture, blood (routine x 2)     Status: None (Preliminary result)   Collection Time: 02/25/14  5:50 PM  Result Value Ref Range Status   Specimen Description BLOOD RIGHT HAND  Final   Special Requests BOTTLES DRAWN AEROBIC ONLY  6CC  Final   Culture   Final           BLOOD CULTURE RECEIVED NO GROWTH TO DATE CULTURE WILL BE HELD FOR 5 DAYS BEFORE ISSUING A FINAL NEGATIVE REPORT Performed at [facility]    Report Status PENDING  Incomplete  Culture, blood (routine x 2)     Status:  None (Preliminary result)   Collection Time: 02/25/14  6:00 PM  Result Value Ref Range Status   Specimen Description BLOOD RIGHT ARM  Final   Special Requests BOTTLES DRAWN AEROBIC ONLY 10CC  Final   Culture   Final           BLOOD CULTURE RECEIVED NO GROWTH TO DATE CULTURE WILL BE HELD FOR 5 DAYS BEFORE ISSUING A FINAL NEGATIVE REPORT Performed at [facility]    Report Status PENDING  Incomplete     Studies: No results found.  Scheduled Meds: . cefazolin 5 mg/ml + heparin 2500 units/ml  2.5 mL Intracatheter Q24H  . cefTRIAXone (ROCEPHIN)  IV  2 g Intravenous Q24H  . dronabinol  5 mg Oral TID AC  . feeding supplement (ENSURE COMPLETE)  237 mL Oral BID BM  . folic acid  1 mg Oral Daily  . pantoprazole  40 mg Oral BID  . pravastatin  20 mg Oral q1800  . saccharomyces boulardii  250 mg Oral BID  . sodium chloride  3 mL Intravenous Q12H  . traZODone  75 mg Oral QHS   Continuous Infusions:     Time spent: > 35 minutes    Velvet Bathe  [facility] Pager [phone] If 7PM-7AM, please contact night-coverage at [email or web address], password [facility] 02/26/2014, 4:52 PM  LOS: 8 days

## 2014-02-26 NOTE — Progress Notes (Signed)
NUTRITION FOLLOW UP  Intervention:   -Continue with Ensure Complete TID -Discontinue Boost Plus -Provided pt with nutrition supplement coupons -Encouraged intake of meal and supplement intake -RD to continue to monitor  Nutrition Dx:   Increased nutrient (protein) needs related to colon cancer with mets as evidenced by 9% weight loss x 1 month; ongoing  Goal:   Pt to meet >/= 90% of their estimated nutrition needs; progressing  Monitor:   Total protein/energy intake, labs, weights  Assessment:   1/12: Pt recently assessed/followed by RDs in previous admission (11/20-12/03). Pt was diagnosed with severe malnutrition.  Per PA note, pt will be advanced to a CL diet. When diet is advanced, pt would like to receive Boost/Ensure supplements. RD will order when appropriate.   Pt reports very poor appetite for a couple of weeks associated with chemo and diarrhea symptoms. States he hasn't been able to eat much.  Per weight history documentation, pt has lost 15 lb since December (9% weight loss x 1 month, significant for time frame).  1/19: -Diet advanced to 1/14 -Pt reported appetite improving, eating 75% of meals. Receiving Marinol 5 mg TID. -Consuming Ensure Complete 2-3 times daily; does not like taste of Boost -Pt denied taste changes/nausea/abd pain post meals or supplements -Provided pt with nutrition supplement coupons to assist in supplement compliance on d/c -Wt increased 15 lbs in one week, likely r/t to fluid fluctuations as well as increased nutrient intake  Height: Ht Readings from Last 1 Encounters:  02/19/14 5\' 10"  (1.778 m)    Weight Status:   Wt Readings from Last 1 Encounters:  02/26/14 161 lb (73.029 kg)    Re-estimated needs:  Kcal: 2000-2200 Protein: 100-110g Fluid: 2L/day  Skin: WDL  Diet Order: DIET SOFT   Intake/Output Summary (Last 24 hours) at 02/26/14 1442 Last data filed at 02/26/14 1203  Gross per 24 hour  Intake   1190 ml  Output    2450 ml  Net  -1260 ml    Last BM: 1/18   Labs:   Recent Labs Lab 02/21/14 0430 02/22/14 0418 02/24/14 0221  NA 133* 131* 133*  K 3.7 4.3 4.8  CL 101 102 96  CO2 26 26 30   BUN 14 15 16   CREATININE 0.96 0.84 0.83  CALCIUM 7.5* 7.2* 7.0*  MG  --  1.3*  --   PHOS  --  1.8*  --   GLUCOSE 98 114* 111*    CBG (last 3)  No results for input(s): GLUCAP in the last 72 hours.  Scheduled Meds: . cefazolin 5 mg/ml + heparin 2500 units/ml  2.5 mL Intracatheter Q24H  . cefTRIAXone (ROCEPHIN)  IV  2 g Intravenous Q24H  . dronabinol  5 mg Oral TID AC  . feeding supplement (ENSURE COMPLETE)  237 mL Oral BID BM  . folic acid  1 mg Oral Daily  . lactose free nutrition  237 mL Oral TID WC  . pantoprazole  40 mg Oral BID  . pravastatin  20 mg Oral q1800  . saccharomyces boulardii  250 mg Oral BID  . sodium chloride  3 mL Intravenous Q12H  . traZODone  75 mg Oral QHS    Continuous Infusions:   Atlee Abide MS RD LDN Clinical Dietitian [phone]

## 2014-02-27 ENCOUNTER — Telehealth: Payer: Self-pay | Admitting: Hematology & Oncology

## 2014-02-27 DIAGNOSIS — Z515 Encounter for palliative care: Secondary | ICD-10-CM

## 2014-02-27 DIAGNOSIS — G479 Sleep disorder, unspecified: Secondary | ICD-10-CM

## 2014-02-27 DIAGNOSIS — R509 Fever, unspecified: Secondary | ICD-10-CM

## 2014-02-27 DIAGNOSIS — R531 Weakness: Secondary | ICD-10-CM

## 2014-02-27 LAB — TYPE AND SCREEN
ABO/RH(D): A POS
Antibody Screen: POSITIVE
DAT, IgG: NEGATIVE
Unit division: 0
Unit division: 0

## 2014-02-27 NOTE — Telephone Encounter (Signed)
Received message from [place] at [facility] pt was in hospital and didn't have any other appointments. I gave message to [facility] she said she would handle it.

## 2014-02-27 NOTE — Progress Notes (Signed)
Physical Therapy Treatment Patient Details Name: Tereso [name] MRN: [id number] DOB: [birth year] Today's Date: 02/27/2014    History of Present Illness [name] is a 59 y.o. male with Past medical history of colonic stent placement and a Port-A-Cath placement for metastatic colon cancer, ongoing with chemotherapy. Admitted with BRBPR.     PT Comments    Gait appeared somewhat less steady this session. Discussed possible need for assistive device-feel pt could benefit from straight cane use for improved stability and safety  Follow Up Recommendations        Equipment Recommendations  Cane    Recommendations for Other Services       Precautions / Restrictions Restrictions Weight Bearing Restrictions: No    Mobility  Bed Mobility Overal bed mobility: Independent                Transfers Overall transfer level: Independent                  Ambulation/Gait Ambulation/Gait assistance: Min guard Ambulation Distance (Feet): 250 Feet Assistive device: None       General Gait Details: close guard for safety. Unsteady at time. HR 119 bpm with activity. Dyspnea 2/4.    Stairs            Wheelchair Mobility    Modified Rankin (Stroke Patients Only)       Balance Overall balance assessment: Needs assistance         Standing balance support: No upper extremity supported;During functional activity Standing balance-Leahy Scale: Fair                      Cognition Arousal/Alertness: Awake/alert Behavior During Therapy: WFL for tasks assessed/performed Overall Cognitive Status: Within Functional Limits for tasks assessed                      Exercises      General Comments        Pertinent Vitals/Pain Pain Assessment: No/denies pain    Home Living                      Prior Function            PT Goals (current goals can now be found in the care plan section) Progress towards PT goals: Progressing toward  goals    Frequency  Min 3X/week    PT Plan Current plan remains appropriate    Co-evaluation             End of Session   Activity Tolerance: Patient tolerated treatment well Patient left: in chair;with call bell/phone within reach     Time: 1352-1405 PT Time Calculation (min) (ACUTE ONLY): 13 min  Charges:  $Gait Training: 8-22 mins                    G Codes:      [name], MPT Pager: [phone]

## 2014-02-27 NOTE — Progress Notes (Signed)
[facility] PROGRESS NOTE  [name] PNT:[id number] DOB: 1956/01/04 DOA: 02/18/2014 PCP: No primary care provider on file.  Assessment/Plan:  Bacteremia - Growing streptococcus agalactiae  - ID consulted and assisting with managment - currently on Rocephin and cefazolin antibiotic lock therapy  Leukocytosis - most likely due to bacteremia, trending down  Principal Problem:    BRBPR (bright red blood per rectum) - HGB steady near or at 9, will reassess next am - monitor on telemetry  Active Problems:   Cerebral embolism with cerebral infarction - stable no new neurological deficits reported. - aspirin on hold due to GI bleed.    Hypokalemia - resolved    Acute GI bleeding - GI on board and patient is s/p flexible sigmoidoscopy with friable areas of the tumor obliterated and cauterized utilizing the argon plasma coagulator - aspirin on hold - reassess hgb levels next am.    Decreased appetite - Currently on Boost and on bland diet.    Weakness generalized - PT on board.    Palliative care encounter - Currently on board and will assist with planning  Code Status:  Family Communication:  Disposition Plan:    Consultants:  GI  Palliative  ID on board  Procedures:  As listed above  Antibiotics:  Rocephin  HPI/Subjective: Pt has no new complaints. No acute issues overnight.  Objective: Filed Vitals:   02/27/14 1318  BP: 103/66  Pulse: 100  Temp: 99.1 F (37.3 C)  Resp: 18    Intake/Output Summary (Last 24 hours) at 02/27/14 1822 Last data filed at 02/27/14 1700  Gross per 24 hour  Intake    840 ml  Output   2600 ml  Net  -1760 ml   Filed Weights   02/25/14 0437 02/26/14 0457 02/27/14 0530  Weight: 73.4 kg (161 lb 13.1 oz) 73.029 kg (161 lb) 71.6 kg (157 lb 13.6 oz)    Exam:   General:  Pt in NAD, alert and awake  Cardiovascular: rrr, no murmurs  Respiratory: cta bl, no wheezes  Abdomen: + bowel sounds, soft,  NT  Musculoskeletal: no cyanosis or clubbing   Data Reviewed: Basic Metabolic Panel:  Recent Labs Lab 02/21/14 0430 02/22/14 0418 02/24/14 0221  NA 133* 131* 133*  K 3.7 4.3 4.8  CL 101 102 96  CO2 26 26 30   GLUCOSE 98 114* 111*  BUN 14 15 16   CREATININE 0.96 0.84 0.83  CALCIUM 7.5* 7.2* 7.0*  MG  --  1.3*  --   PHOS  --  1.8*  --    Liver Function Tests: No results for input(s): AST, ALT, ALKPHOS, BILITOT, PROT, ALBUMIN in the last 168 hours. No results for input(s): LIPASE, AMYLASE in the last 168 hours. No results for input(s): AMMONIA in the last 168 hours. CBC:  Recent Labs Lab 02/21/14 0430  02/22/14 0418  02/23/14 1745 02/24/14 0200 02/24/14 0949 02/25/14 0428 02/26/14 0105  WBC 22.5*  --  16.9*  --   --   --   --  19.4* 16.7*  HGB 8.0*  < > 7.7*  < > 9.0* 8.8* 9.3* 8.6* 9.3*  HCT 23.8*  < > 23.4*  < > 27.1* 27.1* 29.1* 27.3* 29.5*  MCV 87.5  --  89.3  --   --   --   --  92.9 92.8  PLT 116*  --  118*  --   --   --   --  189 238  < > = values in this  interval not displayed. Cardiac Enzymes:  Recent Labs Lab 02/21/14 1218 02/21/14 2020 02/22/14 0418  TROPONINI CRITICAL RESULT CALLED TO, READ BACK BY AND VERIFIED WITH: 0.73* 0.51*   BNP (last 3 results) No results for input(s): PROBNP in the last 8760 hours. CBG: No results for input(s): GLUCAP in the last 168 hours.  Recent Results (from the past 240 hour(s))  MRSA PCR Screening     Status: None   Collection Time: 02/19/14 12:52 AM  Result Value Ref Range Status   MRSA by PCR NEGATIVE NEGATIVE Final    Comment:        The GeneXpert MRSA Assay (FDA approved for NASAL specimens only), is one component of a comprehensive MRSA colonization surveillance program. It is not intended to diagnose MRSA infection nor to guide or monitor treatment for MRSA infections.   Culture, blood (routine x 2)     Status: None   Collection Time: 02/20/14  5:42 PM  Result Value Ref Range Status   Specimen  Description BLOOD RIGHT ARM  Final   Special Requests BOTTLES DRAWN AEROBIC AND ANAEROBIC 10CC EACH  Final   Culture   Final    GROUP B STREP(S.AGALACTIAE)ISOLATED Note: SUSCEPTIBILITIES PERFORMED ON PREVIOUS CULTURE WITHIN THE LAST 5 DAYS. Note: Gram Stain Report Called to,Read Back By and Verified With: SUSAN@12 :50PM ON 02/21/14 BY DANTS Performed at [facility]    Report Status 02/23/2014 FINAL  Final  Culture, Urine     Status: None   Collection Time: 02/20/14  6:04 PM  Result Value Ref Range Status   Specimen Description URINE, CLEAN CATCH  Final   Special Requests NONE  Final   Colony Count NO GROWTH Performed at [facility]   Final   Culture NO GROWTH Performed at [facility]   Final   Report Status 02/21/2014 FINAL  Final  Culture, blood (routine x 2)     Status: None   Collection Time: 02/20/14  6:40 PM  Result Value Ref Range Status   Specimen Description BLOOD RIGHT ARM  Final   Special Requests BOTTLES DRAWN AEROBIC AND ANAEROBIC 10CC EACH  Final   Culture   Final    GROUP B STREP(S.AGALACTIAE)ISOLATED Note: Gram Stain Report Called to,Read Back By and Verified With: SUSAN@12 :50PM ON 02/21/14 BY DANTS Performed at [facility]    Report Status 02/23/2014 FINAL  Final   Organism ID, Bacteria GROUP B STREP(S.AGALACTIAE)ISOLATED  Final      Susceptibility   Group b strep(s.agalactiae)isolated - MIC*    AMPICILLIN <=0.25 SENSITIVE Sensitive     PENICILLIN <=0.06 SENSITIVE Sensitive     CLINDAMYCIN 0.5 INTERMEDIATE Intermediate     ERYTHROMYCIN <=0.12 SENSITIVE Sensitive     VANCOMYCIN 0.5 SENSITIVE Sensitive     LEVOFLOXACIN 1 SENSITIVE Sensitive     * GROUP B STREP(S.AGALACTIAE)ISOLATED  Culture, blood (routine x 2)     Status: None (Preliminary result)   Collection Time: 02/25/14  5:50 PM  Result Value Ref Range Status   Specimen Description BLOOD RIGHT HAND  Final   Special Requests BOTTLES DRAWN AEROBIC ONLY 6CC  Final    Culture   Final           BLOOD CULTURE RECEIVED NO GROWTH TO DATE CULTURE WILL BE HELD FOR 5 DAYS BEFORE ISSUING A FINAL NEGATIVE REPORT Performed at [facility]    Report Status PENDING  Incomplete  Culture, blood (routine x 2)     Status: None (Preliminary result)  Collection Time: 02/25/14  6:00 PM  Result Value Ref Range Status   Specimen Description BLOOD RIGHT ARM  Final   Special Requests BOTTLES DRAWN AEROBIC ONLY 10CC  Final   Culture   Final           BLOOD CULTURE RECEIVED NO GROWTH TO DATE CULTURE WILL BE HELD FOR 5 DAYS BEFORE ISSUING A FINAL NEGATIVE REPORT Performed at [facility]    Report Status PENDING  Incomplete     Studies: No results found.  Scheduled Meds: . cefazolin 5 mg/ml + heparin 2500 units/ml  2.5 mL Intracatheter Q24H  . cefTRIAXone (ROCEPHIN)  IV  2 g Intravenous Q24H  . dronabinol  5 mg Oral TID AC  . feeding supplement (ENSURE COMPLETE)  237 mL Oral BID BM  . folic acid  1 mg Oral Daily  . pantoprazole  40 mg Oral BID  . pravastatin  20 mg Oral q1800  . saccharomyces boulardii  250 mg Oral BID  . sodium chloride  3 mL Intravenous Q12H  . traZODone  75 mg Oral QHS   Continuous Infusions:     Time spent: > 35 minutes    Velvet Bathe  [facility] Pager [phone] If 7PM-7AM, please contact night-coverage at [email or web address], password [facility] 02/27/2014, 6:22 PM  LOS: 9 days

## 2014-02-27 NOTE — Progress Notes (Signed)
Patient ID: [name], male   DOB: 11/26/1955, 59 y.o.   MRN: [id number]         [facility]    Date of Admission:  02/18/2014   Total days of antibiotics 8          Principal Problem:   Streptococcal bacteremia   . cefazolin 5 mg/ml + heparin 2500 units/ml  2.5 mL Intracatheter Q24H  . cefTRIAXone (ROCEPHIN)  IV  2 g Intravenous Q24H  . dronabinol  5 mg Oral TID AC  . feeding supplement (ENSURE COMPLETE)  237 mL Oral BID BM  . folic acid  1 mg Oral Daily  . pantoprazole  40 mg Oral BID  . pravastatin  20 mg Oral q1800  . saccharomyces boulardii  250 mg Oral BID  . sodium chloride  3 mL Intravenous Q12H  . traZODone  75 mg Oral QHS    Subjective: He had some fever and sweats last night. Otherwise he feels well without any new complaints. He denies any new cough, discomfort around his port, abdominal pain, diarrhea and dysuria.  Review of Systems: Pertinent items are noted in HPI.  Past Medical History  Diagnosis Date  . Hypertension   . Cerebral embolism   . Malignant neoplasm of colon 12/30/2013    History  Substance Use Topics  . Smoking status: Never Smoker   . Smokeless tobacco: Never Used  . Alcohol Use: Yes    History reviewed. No pertinent family history. Allergies  Allergen Reactions  . Penicillins     childhood    OBJECTIVE: Blood pressure 103/66, pulse 100, temperature 99.1 F (37.3 C), temperature source Oral, resp. rate 18, height 5\' 10"  (1.778 m), weight 157 lb 13.6 oz (71.6 kg), SpO2 100 %. General: He is alert and in no distress Skin: No rash Lungs: Clear Cor: Regular S1 and S2 no murmurs Port-A-Cath site is normal Abdomen: Soft and nontender  Lab Results Lab Results  Component Value Date   WBC 16.7* 02/26/2014   HGB 9.3* 02/26/2014   HCT 29.5* 02/26/2014   MCV 92.8 02/26/2014   PLT 238 02/26/2014    Lab Results  Component Value Date   CREATININE 0.83 02/24/2014   BUN 16 02/24/2014   NA 133*  02/24/2014   K 4.8 02/24/2014   CL 96 02/24/2014   CO2 30 02/24/2014    Lab Results  Component Value Date   ALT 11 02/19/2014   AST 29 02/19/2014   ALKPHOS 141* 02/19/2014   BILITOT 0.8 02/19/2014     Microbiology: Recent Results (from the past 240 hour(s))  MRSA PCR Screening     Status: None   Collection Time: 02/19/14 12:52 AM  Result Value Ref Range Status   MRSA by PCR NEGATIVE NEGATIVE Final    Comment:        The GeneXpert MRSA Assay (FDA approved for NASAL specimens only), is one component of a comprehensive MRSA colonization surveillance program. It is not intended to diagnose MRSA infection nor to guide or monitor treatment for MRSA infections.   Culture, blood (routine x 2)     Status: None   Collection Time: 02/20/14  5:42 PM  Result Value Ref Range Status   Specimen Description BLOOD RIGHT ARM  Final   Special Requests BOTTLES DRAWN AEROBIC AND ANAEROBIC 10CC EACH  Final   Culture   Final    GROUP B STREP(S.AGALACTIAE)ISOLATED Note: SUSCEPTIBILITIES PERFORMED ON PREVIOUS CULTURE WITHIN THE LAST 5 DAYS. Note:  Gram Stain Report Called to,Read Back By and Verified With: SUSAN@12 :50PM ON 02/21/14 BY DANTS Performed at [facility]    Report Status 02/23/2014 FINAL  Final  Culture, Urine     Status: None   Collection Time: 02/20/14  6:04 PM  Result Value Ref Range Status   Specimen Description URINE, CLEAN CATCH  Final   Special Requests NONE  Final   Colony Count NO GROWTH Performed at [facility]   Final   Culture NO GROWTH Performed at [facility]   Final   Report Status 02/21/2014 FINAL  Final  Culture, blood (routine x 2)     Status: None   Collection Time: 02/20/14  6:40 PM  Result Value Ref Range Status   Specimen Description BLOOD RIGHT ARM  Final   Special Requests BOTTLES DRAWN AEROBIC AND ANAEROBIC 10CC EACH  Final   Culture   Final    GROUP B STREP(S.AGALACTIAE)ISOLATED Note: Gram Stain Report Called to,Read  Back By and Verified With: SUSAN@12 :50PM ON 02/21/14 BY DANTS Performed at [facility]    Report Status 02/23/2014 FINAL  Final   Organism ID, Bacteria GROUP B STREP(S.AGALACTIAE)ISOLATED  Final      Susceptibility   Group b strep(s.agalactiae)isolated - MIC*    AMPICILLIN <=0.25 SENSITIVE Sensitive     PENICILLIN <=0.06 SENSITIVE Sensitive     CLINDAMYCIN 0.5 INTERMEDIATE Intermediate     ERYTHROMYCIN <=0.12 SENSITIVE Sensitive     VANCOMYCIN 0.5 SENSITIVE Sensitive     LEVOFLOXACIN 1 SENSITIVE Sensitive     * GROUP B STREP(S.AGALACTIAE)ISOLATED  Culture, blood (routine x 2)     Status: None (Preliminary result)   Collection Time: 02/25/14  5:50 PM  Result Value Ref Range Status   Specimen Description BLOOD RIGHT HAND  Final   Special Requests BOTTLES DRAWN AEROBIC ONLY 6CC  Final   Culture   Final           BLOOD CULTURE RECEIVED NO GROWTH TO DATE CULTURE WILL BE HELD FOR 5 DAYS BEFORE ISSUING A FINAL NEGATIVE REPORT Performed at [facility]    Report Status PENDING  Incomplete  Culture, blood (routine x 2)     Status: None (Preliminary result)   Collection Time: 02/25/14  6:00 PM  Result Value Ref Range Status   Specimen Description BLOOD RIGHT ARM  Final   Special Requests BOTTLES DRAWN AEROBIC ONLY 10CC  Final   Culture   Final           BLOOD CULTURE RECEIVED NO GROWTH TO DATE CULTURE WILL BE HELD FOR 5 DAYS BEFORE ISSUING A FINAL NEGATIVE REPORT Performed at [facility]    Report Status PENDING  Incomplete    Assessment: He seems to be improving on therapy for group B streptococcal bacteremia but has had recurrent fever. His most recent blood culture 2 days ago is negative to date. He continues to have more fever I will repeat blood cultures and consider TEE and Port-A-Cath removal.  Plan: 1. Continue IV ceftriaxone and cefazolin antibiotic lock therapy  Michel Bickers, MD [facility] [place] [phone] pager   [phone] cell 02/27/2014, 4:52 PM

## 2014-02-27 NOTE — Progress Notes (Signed)
Progress Note from the Palliative Medicine Team at [facility]: I spoke with [name] again today. He is still complaining of not being able to sleep at night. I have had discussion with his and his ex-wife and this living situation is not working for either of them. I have asked for CSW - Claiborne Billings to assist with disposition options. I believe that he will need ALF as he has had weakness, dizziness, fatigue and his ex-wife confirmed that she is scared for him to be alone. He is agreeable to alternative living arrangement such as ALF or otherwise as resources allow. He knows that he needs help but does not have good support other than his ex-wife.    Objective: Allergies  Allergen Reactions  . Penicillins     childhood   Scheduled Meds: . cefazolin 5 mg/ml + heparin 2500 units/ml  2.5 mL Intracatheter Q24H  . cefTRIAXone (ROCEPHIN)  IV  2 g Intravenous Q24H  . dronabinol  5 mg Oral TID AC  . feeding supplement (ENSURE COMPLETE)  237 mL Oral BID BM  . folic acid  1 mg Oral Daily  . pantoprazole  40 mg Oral BID  . pravastatin  20 mg Oral q1800  . saccharomyces boulardii  250 mg Oral BID  . sodium chloride  3 mL Intravenous Q12H  . traZODone  75 mg Oral QHS   Continuous Infusions:  PRN Meds:.acetaminophen **OR** acetaminophen, ondansetron **OR** ondansetron (ZOFRAN) IV, oxyCODONE, sodium chloride, traZODone  BP 103/66 mmHg  Pulse 100  Temp(Src) 99.1 F (37.3 C) (Oral)  Resp 18  Ht 5\' 10"  (1.778 m)  Wt 71.6 kg (157 lb 13.6 oz)  BMI 22.65 kg/m2  SpO2 100%   PPS: 30%   Intake/Output Summary (Last 24 hours) at 02/27/14 1554 Last data filed at 02/27/14 1317  Gross per 24 hour  Intake    480 ml  Output   2600 ml  Net  -2120 ml      LBM: 02/25/13  Physical Exam:  General: NAD, lying in bed HEENT: [place]/AT, no JVD, moist mucous membranes Chest: Shortness of breath appears improved, symmetric CVS: RRR Abdomen: Soft, NT, ND, +BS Ext: MAE, no edema, warm to  touch Neuro: Awake, alert, oriented x 3   Labs: CBC    Component Value Date/Time   WBC 16.7* 02/26/2014 0105   WBC 12.1* 02/13/2014 0916   WBC 10.4* 01/22/2014 0848   RBC 3.18* 02/26/2014 0105   RBC 3.87* 02/13/2014 0916   RBC 4.04* 01/22/2014 0848   HGB 9.3* 02/26/2014 0105   HGB 11.4* 02/13/2014 0916   HGB 11.5* 01/22/2014 0848   HCT 29.5* 02/26/2014 0105   HCT 33.5* 02/13/2014 0916   HCT 34.6* 01/22/2014 0848   PLT 238 02/26/2014 0105   PLT 295 02/13/2014 0916   PLT 350 01/22/2014 0848   MCV 92.8 02/26/2014 0105   MCV 86.6 02/13/2014 0916   MCV 86 01/22/2014 0848   MCH 29.2 02/26/2014 0105   MCH 29.5 02/13/2014 0916   MCH 28.5 01/22/2014 0848   MCHC 31.5 02/26/2014 0105   MCHC 34.0 02/13/2014 0916   MCHC 33.2 01/22/2014 0848   RDW 19.7* 02/26/2014 0105   RDW 19.8* 02/13/2014 0916   RDW 19.8* 01/22/2014 0848   LYMPHSABS 2.7 02/19/2014 1345   LYMPHSABS 1.1 02/13/2014 0916   LYMPHSABS 1.7 01/22/2014 0848   MONOABS 1.0 02/19/2014 1345   MONOABS 3.1* 02/13/2014 0916   EOSABS 0.0 02/19/2014 1345   EOSABS 0.3 02/13/2014  0916   EOSABS 0.3 01/22/2014 0848   BASOSABS 0.0 02/19/2014 1345   BASOSABS 0.0 02/13/2014 0916   BASOSABS 0.0 01/22/2014 0848    BMET    Component Value Date/Time   NA 133* 02/24/2014 0221   NA 136 02/13/2014 0917   NA 137 01/22/2014 0848   K 4.8 02/24/2014 0221   K 3.5 02/13/2014 0917   K 3.6 01/22/2014 0848   CL 96 02/24/2014 0221   CL 96* 01/22/2014 0848   CO2 30 02/24/2014 0221   CO2 26 02/13/2014 0917   CO2 25 01/22/2014 0848   GLUCOSE 111* 02/24/2014 0221   GLUCOSE 90 02/13/2014 0917   GLUCOSE 144* 01/22/2014 0848   BUN 16 02/24/2014 0221   BUN 17.3 02/13/2014 0917   BUN 14 01/22/2014 0848   CREATININE 0.83 02/24/2014 0221   CREATININE 1.0 02/13/2014 0917   CREATININE 1.3* 01/22/2014 0848   CALCIUM 7.0* 02/24/2014 0221   CALCIUM 8.9 02/13/2014 0917   CALCIUM 8.8 01/22/2014 0848   GFRNONAA >90 02/24/2014 0221   GFRAA >90  02/24/2014 0221    CMP     Component Value Date/Time   NA 133* 02/24/2014 0221   NA 136 02/13/2014 0917   NA 137 01/22/2014 0848   K 4.8 02/24/2014 0221   K 3.5 02/13/2014 0917   K 3.6 01/22/2014 0848   CL 96 02/24/2014 0221   CL 96* 01/22/2014 0848   CO2 30 02/24/2014 0221   CO2 26 02/13/2014 0917   CO2 25 01/22/2014 0848   GLUCOSE 111* 02/24/2014 0221   GLUCOSE 90 02/13/2014 0917   GLUCOSE 144* 01/22/2014 0848   BUN 16 02/24/2014 0221   BUN 17.3 02/13/2014 0917   BUN 14 01/22/2014 0848   CREATININE 0.83 02/24/2014 0221   CREATININE 1.0 02/13/2014 0917   CREATININE 1.3* 01/22/2014 0848   CALCIUM 7.0* 02/24/2014 0221   CALCIUM 8.9 02/13/2014 0917   CALCIUM 8.8 01/22/2014 0848   PROT 5.1* 02/19/2014 1345   PROT 7.3 02/13/2014 0917   PROT 7.5 01/22/2014 0848   ALBUMIN 2.2* 02/19/2014 1345   ALBUMIN 2.5* 02/13/2014 0917   AST 29 02/19/2014 1345   AST 37* 02/13/2014 0917   AST 64* 01/22/2014 0848   ALT 11 02/19/2014 1345   ALT 14 02/13/2014 0917   ALT 17 01/22/2014 0848   ALKPHOS 141* 02/19/2014 1345   ALKPHOS 306* 02/13/2014 0917   ALKPHOS 261* 01/22/2014 0848   BILITOT 0.8 02/19/2014 1345   BILITOT 0.24 02/13/2014 0917   BILITOT 0.50 01/22/2014 0848   GFRNONAA >90 02/24/2014 0221   GFRAA >90 02/24/2014 0221    Assessment and Plan: 1. Code Status: FULL 2. Symptom Control: 1. Recommend increase trazodone to 100 mg qhs.  2. Weakness: Continue PT and medical management.  3. Psycho/Social: Emotional support provided to patient.  4. Disposition: CSW to help with disposition.     Time In Time Out Total Time Spent with Patient Total Overall Time  1540 1600 76min 20min    Greater than 50%  of this time was spent counseling and coordinating care related to the above assessment and plan.  Vinie Sill, NP Palliative Medicine Team Pager # [phone] (M-F 8a-5p) Team Phone # [phone] (Nights/Weekends)

## 2014-02-27 NOTE — Progress Notes (Addendum)
ANTIBIOTIC CONSULT NOTE   Pharmacy Consult for cefazolin Indication: Group B strep bacteremia  Allergies  Allergen Reactions  . Penicillins     childhood    Patient Measurements: Height: 5\' 10"  (177.8 cm) Weight: 157 lb 13.6 oz (71.6 kg) IBW/kg (Calculated) : 73   Vital Signs: Temp: 99.1 F (37.3 C) (01/20 1318) Temp Source: Oral (01/20 1318) BP: 103/66 mmHg (01/20 1318) Pulse Rate: 100 (01/20 1318) Intake/Output from previous day: 01/19 0701 - 01/20 0700 In: 240 [P.O.:240] Out: 3350 [Urine:3350] Intake/Output from this shift: Total I/O In: 240 [P.O.:240] Out: 500 [Urine:500]  Labs:  Recent Labs  02/25/14 0428 02/26/14 0105  WBC 19.4* 16.7*  HGB 8.6* 9.3*  PLT 189 238   Estimated Creatinine Clearance: 98.2 mL/min (by C-G formula based on Cr of 0.83).    Medical History: Past Medical History  Diagnosis Date  . Hypertension   . Cerebral embolism   . Malignant neoplasm of colon 12/30/2013    Assessment: 59 y/o M with metastatic colon cancer admitted with GI bleeding, now with Group B Strep bacteremia.  Currently receiving vancomycin but isolate is PCN-susceptible so a first-generation cephalosporin should cover.  Patient has hx of PCN allergy in childhood but did receive cefazolin in hospital x 1 dose in November 2015 for a procedure.  Recommended de-escalation of regimen from vancomycin to cefazolin and received MD orders for same.  1/13 >> Levaquin >> 1/15 1/14 >> vancomycin >>1/17 1/17 >> cefazolin >> 1/18 1/18 >> ceftriaxone >>  Tmax: 101.3 WBCs: elevated, improving Renal: SCr 0.83 stable 1/17, CrCl 100 CG  1/13 blood x2: 2 of 2 sets growing group B strep, S to amp, erythro, levo, PCN, vanco (I to clinda) 1/13 urine: NGF 1/12 MRSA PCR screen: negative 1/18 blood x2: NGTD  Goal of Therapy:  Appropriate antibiotic dosing for indication and renal function; eradication of infection.   Plan:   Continue cefazolin 2 grams IV q8h  No further  adjustment needed at this time  Antibiotic lock per orders.  Doreene Eland, PharmD, BCPS.   Pager: [phone] 02/27/2014  2:10 PM

## 2014-02-28 LAB — CBC
HCT: 30.2 % — ABNORMAL LOW (ref 39.0–52.0)
Hemoglobin: 9.5 g/dL — ABNORMAL LOW (ref 13.0–17.0)
MCH: 29.3 pg (ref 26.0–34.0)
MCHC: 31.5 g/dL (ref 30.0–36.0)
MCV: 93.2 fL (ref 78.0–100.0)
Platelets: 313 10*3/uL (ref 150–400)
RBC: 3.24 MIL/uL — AB (ref 4.22–5.81)
RDW: 19.5 % — AB (ref 11.5–15.5)
WBC: 11.4 10*3/uL — AB (ref 4.0–10.5)

## 2014-02-28 NOTE — Progress Notes (Signed)
Clinical Social Work Department BRIEF PSYCHOSOCIAL ASSESSMENT 02/28/2014  Patient:  [name], [name]     Account Number:  [id number]     Admit date:  02/18/2014  Clinical Social Worker:  Glorious Peach, CLINICAL SOCIAL WORKER  Date/Time:  02/28/2014 11:37 AM  Referred by:  Physician  Date Referred:  02/28/2014 Referred for  ALF Placement   Other Referral:   Interview type:  Patient Other interview type:    PSYCHOSOCIAL DATA Living Status:  FAMILY Admitted from facility:   Level of care:   Primary support name:  Coletta Memos Primary support relationship to patient:  SPOUSE Degree of support available:   Adequate    CURRENT CONCERNS Current Concerns  Post-Acute Placement   Other Concerns:   ALF placement    SOCIAL WORK ASSESSMENT / PLAN CSW recieved consult, patient inquring about ALF placement. CSW to obtain information on ALF and explain to patient.   Assessment/plan status:  Referral to [employer] Other assessment/ plan:   Information/referral to community resources:   CSW to give handout resources of ALFs to patient.    PATIENT'S/FAMILY'S RESPONSE TO PLAN OF CARE: CSW received consult on patient's request for ALF placement. CSW introduced self and explained role to patient. CSW inquired about patient's current living situation. Patient explained to CSW of living with ex-wife who has been good to him but knows that he needs to leave home and get place of his own. Patient told CSW that he has applied for Medicaid and hopes to hear something soon because he cannot afford private pay. CSW gave patient a list of ALFs in [facility] and explained that patient should visit facilities and inquire about costs and other questions he may have. Patient appreciative of resources. CSW encouraged patient to contact us if needed. CSW is signing off.       Glorious Peach BSW Intern

## 2014-02-28 NOTE — Progress Notes (Signed)
Patient ID: [name], male   DOB: 1955/08/15, 59 y.o.   MRN: [id number]         [facility] for Infectious Disease    Date of Admission:  02/18/2014   Total days of antibiotics 9          Principal Problem:   Streptococcal bacteremia   . cefazolin 5 mg/ml + heparin 2500 units/ml  2.5 mL Intracatheter Q24H  . cefTRIAXone (ROCEPHIN)  IV  2 g Intravenous Q24H  . dronabinol  5 mg Oral TID AC  . feeding supplement (ENSURE COMPLETE)  237 mL Oral BID BM  . folic acid  1 mg Oral Daily  . pantoprazole  40 mg Oral BID  . pravastatin  20 mg Oral q1800  . saccharomyces boulardii  250 mg Oral BID  . sodium chloride  3 mL Intravenous Q12H  . traZODone  75 mg Oral QHS    Subjective: No fever recorded last night but he had sweats last night. Otherwise he feels well without any new complaints.   Review of Systems: Pertinent items are noted in HPI.  Past Medical History  Diagnosis Date  . Hypertension   . Cerebral embolism   . Malignant neoplasm of colon 12/30/2013    History  Substance Use Topics  . Smoking status: Never Smoker   . Smokeless tobacco: Never Used  . Alcohol Use: Yes    History reviewed. No pertinent family history. Allergies  Allergen Reactions  . Penicillins     childhood    OBJECTIVE: Blood pressure 98/74, pulse 101, temperature 98.3 F (36.8 C), temperature source Oral, resp. rate 18, height 5\' 10"  (1.778 m), weight 159 lb 9.8 oz (72.4 kg), SpO2 100 %.   General: He is alert and in no distress Skin: No rash Lungs: Clear Cor: Regular S1 and S2 no murmurs Port-A-Cath site is normal Abdomen: Soft and nontender  Lab Results Lab Results  Component Value Date   WBC 11.4* 02/28/2014   HGB 9.5* 02/28/2014   HCT 30.2* 02/28/2014   MCV 93.2 02/28/2014   PLT 313 02/28/2014    Lab Results  Component Value Date   CREATININE 0.83 02/24/2014   BUN 16 02/24/2014   NA 133* 02/24/2014   K 4.8 02/24/2014   CL 96 02/24/2014   CO2 30 02/24/2014      Lab Results  Component Value Date   ALT 11 02/19/2014   AST 29 02/19/2014   ALKPHOS 141* 02/19/2014   BILITOT 0.8 02/19/2014     Microbiology: Recent Results (from the past 240 hour(s))  MRSA PCR Screening     Status: None   Collection Time: 02/19/14 12:52 AM  Result Value Ref Range Status   MRSA by PCR NEGATIVE NEGATIVE Final    Comment:        The GeneXpert MRSA Assay (FDA approved for NASAL specimens only), is one component of a comprehensive MRSA colonization surveillance program. It is not intended to diagnose MRSA infection nor to guide or monitor treatment for MRSA infections.   Culture, blood (routine x 2)     Status: None   Collection Time: 02/20/14  5:42 PM  Result Value Ref Range Status   Specimen Description BLOOD RIGHT ARM  Final   Special Requests BOTTLES DRAWN AEROBIC AND ANAEROBIC 10CC EACH  Final   Culture   Final    GROUP B STREP(S.AGALACTIAE)ISOLATED Note: SUSCEPTIBILITIES PERFORMED ON PREVIOUS CULTURE WITHIN THE LAST 5 DAYS. Note: Gram Stain Report Called to,Read Back By  and Verified With: SUSAN@12 :50PM ON 02/21/14 BY DANTS Performed at [facility]    Report Status 02/23/2014 FINAL  Final  Culture, Urine     Status: None   Collection Time: 02/20/14  6:04 PM  Result Value Ref Range Status   Specimen Description URINE, CLEAN CATCH  Final   Special Requests NONE  Final   Colony Count NO GROWTH Performed at [facility]   Final   Culture NO GROWTH Performed at [facility]   Final   Report Status 02/21/2014 FINAL  Final  Culture, blood (routine x 2)     Status: None   Collection Time: 02/20/14  6:40 PM  Result Value Ref Range Status   Specimen Description BLOOD RIGHT ARM  Final   Special Requests BOTTLES DRAWN AEROBIC AND ANAEROBIC 10CC EACH  Final   Culture   Final    GROUP B STREP(S.AGALACTIAE)ISOLATED Note: Gram Stain Report Called to,Read Back By and Verified With: SUSAN@12 :50PM ON 02/21/14 BY DANTS Performed at  [facility]    Report Status 02/23/2014 FINAL  Final   Organism ID, Bacteria GROUP B STREP(S.AGALACTIAE)ISOLATED  Final      Susceptibility   Group b strep(s.agalactiae)isolated - MIC*    AMPICILLIN <=0.25 SENSITIVE Sensitive     PENICILLIN <=0.06 SENSITIVE Sensitive     CLINDAMYCIN 0.5 INTERMEDIATE Intermediate     ERYTHROMYCIN <=0.12 SENSITIVE Sensitive     VANCOMYCIN 0.5 SENSITIVE Sensitive     LEVOFLOXACIN 1 SENSITIVE Sensitive     * GROUP B STREP(S.AGALACTIAE)ISOLATED  Culture, blood (routine x 2)     Status: None (Preliminary result)   Collection Time: 02/25/14  5:50 PM  Result Value Ref Range Status   Specimen Description BLOOD RIGHT HAND  Final   Special Requests BOTTLES DRAWN AEROBIC ONLY 6CC  Final   Culture   Final           BLOOD CULTURE RECEIVED NO GROWTH TO DATE CULTURE WILL BE HELD FOR 5 DAYS BEFORE ISSUING A FINAL NEGATIVE REPORT Performed at [facility]    Report Status PENDING  Incomplete  Culture, blood (routine x 2)     Status: None (Preliminary result)   Collection Time: 02/25/14  6:00 PM  Result Value Ref Range Status   Specimen Description BLOOD RIGHT ARM  Final   Special Requests BOTTLES DRAWN AEROBIC ONLY 10CC  Final   Culture   Final           BLOOD CULTURE RECEIVED NO GROWTH TO DATE CULTURE WILL BE HELD FOR 5 DAYS BEFORE ISSUING A FINAL NEGATIVE REPORT Performed at [facility]    Report Status PENDING  Incomplete    Assessment: He seems to be improving on therapy for group B streptococcal bacteremia but but he has had recent fever and sweats. His most recent blood culture 3 days ago remains negative and there is no clinical evidence of Port-A-Cath infection. He had no evidence of endocarditis by transthoracic echocardiogram.  Plan: 1. Continue IV ceftriaxone and cefazolin antibiotic lock therapy 2. Await final blood culture results 3. Monitor temperature curve  Michel Bickers, [place] [facility] [place] [phone] pager   [phone] cell 02/28/2014, 3:59 PM

## 2014-02-28 NOTE — Progress Notes (Signed)
[facility] PROGRESS NOTE  Juno Bozard IRC:[id number] DOB: [birth year] DOA: 02/18/2014 PCP: No primary care provider on file.  Assessment/Plan:  Bacteremia - Growing streptococcus agalactiae  - ID consulted and assisting with managment - currently on Rocephin and cefazolin antibiotic lock therapy - has not spiked a fever in the last 24 hours  Leukocytosis - most likely due to bacteremia, trending down  Principal Problem:    BRBPR (bright red blood per rectum) - HGB steady near or at 9, will reassess next am - monitor on telemetry  Active Problems:   Cerebral embolism with cerebral infarction - stable no new neurological deficits reported. - aspirin on hold due to GI bleed.    Hypokalemia - resolved    Acute GI bleeding - GI on board and patient is s/p flexible sigmoidoscopy with friable areas of the tumor obliterated and cauterized utilizing the argon plasma coagulator - aspirin  - reassess hgb levels next am.    Decreased appetite - Currently on Boost and on bland diet.    Weakness generalized - PT on board.    Palliative care encounter - Currently on board and will assist with planning  Code Status: full Family Communication: no family at bedside Disposition Plan: Pending improvement in condition and recommendations from specialist.   Consultants:  GI  Palliative  ID on board  Procedures:  As listed above  Antibiotics:  Rocephin  HPI/Subjective: Pt has no new complaints. No acute issues overnight.  Objective: Filed Vitals:   02/28/14 1338  BP: 98/74  Pulse: 101  Temp: 98.3 F (36.8 C)  Resp: 18    Intake/Output Summary (Last 24 hours) at 02/28/14 1416 Last data filed at 02/28/14 1300  Gross per 24 hour  Intake    960 ml  Output   1675 ml  Net   -715 ml   Filed Weights   02/26/14 0457 02/27/14 0530 02/28/14 0541  Weight: 73.029 kg (161 lb) 71.6 kg (157 lb 13.6 oz) 72.4 kg (159 lb 9.8 oz)    Exam:   General:  Pt in NAD,  alert and awake  Cardiovascular: rrr, no murmurs  Respiratory: cta bl, no wheezes  Abdomen: + bowel sounds, soft, NT  Musculoskeletal: no cyanosis or clubbing   Data Reviewed: Basic Metabolic Panel:  Recent Labs Lab 02/22/14 0418 02/24/14 0221  NA 131* 133*  K 4.3 4.8  CL 102 96  CO2 26 30  GLUCOSE 114* 111*  BUN 15 16  CREATININE 0.84 0.83  CALCIUM 7.2* 7.0*  MG 1.3*  --   PHOS 1.8*  --    Liver Function Tests: No results for input(s): AST, ALT, ALKPHOS, BILITOT, PROT, ALBUMIN in the last 168 hours. No results for input(s): LIPASE, AMYLASE in the last 168 hours. No results for input(s): AMMONIA in the last 168 hours. CBC:  Recent Labs Lab 02/22/14 0418  02/24/14 0200 02/24/14 0949 02/25/14 0428 02/26/14 0105 02/28/14 0835  WBC 16.9*  --   --   --  19.4* 16.7* 11.4*  HGB 7.7*  < > 8.8* 9.3* 8.6* 9.3* 9.5*  HCT 23.4*  < > 27.1* 29.1* 27.3* 29.5* 30.2*  MCV 89.3  --   --   --  92.9 92.8 93.2  PLT 118*  --   --   --  189 238 313  < > = values in this interval not displayed. Cardiac Enzymes:  Recent Labs Lab 02/21/14 2020 02/22/14 0418  TROPONINI 0.73* 0.51*   BNP (last 3 results) No  results for input(s): PROBNP in the last 8760 hours. CBG: No results for input(s): GLUCAP in the last 168 hours.  Recent Results (from the past 240 hour(s))  MRSA PCR Screening     Status: None   Collection Time: 02/19/14 12:52 AM  Result Value Ref Range Status   MRSA by PCR NEGATIVE NEGATIVE Final    Comment:        The GeneXpert MRSA Assay (FDA approved for NASAL specimens only), is one component of a comprehensive MRSA colonization surveillance program. It is not intended to diagnose MRSA infection nor to guide or monitor treatment for MRSA infections.   Culture, blood (routine x 2)     Status: None   Collection Time: 02/20/14  5:42 PM  Result Value Ref Range Status   Specimen Description BLOOD RIGHT ARM  Final   Special Requests BOTTLES DRAWN AEROBIC AND  ANAEROBIC 10CC EACH  Final   Culture   Final    GROUP B STREP(S.AGALACTIAE)ISOLATED Note: SUSCEPTIBILITIES PERFORMED ON PREVIOUS CULTURE WITHIN THE LAST 5 DAYS. Note: Gram Stain Report Called to,Read Back By and Verified With: SUSAN@12 :50PM ON 02/21/14 BY DANTS Performed at [facility]    Report Status 02/23/2014 FINAL  Final  Culture, Urine     Status: None   Collection Time: 02/20/14  6:04 PM  Result Value Ref Range Status   Specimen Description URINE, CLEAN CATCH  Final   Special Requests NONE  Final   Colony Count NO GROWTH Performed at [facility]   Final   Culture NO GROWTH Performed at [facility]   Final   Report Status 02/21/2014 FINAL  Final  Culture, blood (routine x 2)     Status: None   Collection Time: 02/20/14  6:40 PM  Result Value Ref Range Status   Specimen Description BLOOD RIGHT ARM  Final   Special Requests BOTTLES DRAWN AEROBIC AND ANAEROBIC 10CC EACH  Final   Culture   Final    GROUP B STREP(S.AGALACTIAE)ISOLATED Note: Gram Stain Report Called to,Read Back By and Verified With: SUSAN@12 :50PM ON 02/21/14 BY DANTS Performed at [facility]    Report Status 02/23/2014 FINAL  Final   Organism ID, Bacteria GROUP B STREP(S.AGALACTIAE)ISOLATED  Final      Susceptibility   Group b strep(s.agalactiae)isolated - MIC*    AMPICILLIN <=0.25 SENSITIVE Sensitive     PENICILLIN <=0.06 SENSITIVE Sensitive     CLINDAMYCIN 0.5 INTERMEDIATE Intermediate     ERYTHROMYCIN <=0.12 SENSITIVE Sensitive     VANCOMYCIN 0.5 SENSITIVE Sensitive     LEVOFLOXACIN 1 SENSITIVE Sensitive     * GROUP B STREP(S.AGALACTIAE)ISOLATED  Culture, blood (routine x 2)     Status: None (Preliminary result)   Collection Time: 02/25/14  5:50 PM  Result Value Ref Range Status   Specimen Description BLOOD RIGHT HAND  Final   Special Requests BOTTLES DRAWN AEROBIC ONLY 6CC  Final   Culture   Final           BLOOD CULTURE RECEIVED NO GROWTH TO DATE CULTURE WILL BE  HELD FOR 5 DAYS BEFORE ISSUING A FINAL NEGATIVE REPORT Performed at [facility]    Report Status PENDING  Incomplete  Culture, blood (routine x 2)     Status: None (Preliminary result)   Collection Time: 02/25/14  6:00 PM  Result Value Ref Range Status   Specimen Description BLOOD RIGHT ARM  Final   Special Requests BOTTLES DRAWN AEROBIC ONLY 10CC  Final  Culture   Final           BLOOD CULTURE RECEIVED NO GROWTH TO DATE CULTURE WILL BE HELD FOR 5 DAYS BEFORE ISSUING A FINAL NEGATIVE REPORT Performed at [facility]    Report Status PENDING  Incomplete     Studies: No results found.  Scheduled Meds: . cefazolin 5 mg/ml + heparin 2500 units/ml  2.5 mL Intracatheter Q24H  . cefTRIAXone (ROCEPHIN)  IV  2 g Intravenous Q24H  . dronabinol  5 mg Oral TID AC  . feeding supplement (ENSURE COMPLETE)  237 mL Oral BID BM  . folic acid  1 mg Oral Daily  . pantoprazole  40 mg Oral BID  . pravastatin  20 mg Oral q1800  . saccharomyces boulardii  250 mg Oral BID  . sodium chloride  3 mL Intravenous Q12H  . traZODone  75 mg Oral QHS   Continuous Infusions:     Time spent: > 35 minutes    Velvet Bathe  [facility] Pager [phone] If 7PM-7AM, please contact night-coverage at [email or web address], password [facility] 02/28/2014, 2:16 PM  LOS: 10 days

## 2014-03-01 NOTE — Progress Notes (Signed)
[facility] PROGRESS NOTE  [name] GMW:[id number] DOB: [birth year] DOA: 02/18/2014 PCP: No primary care provider on file.  Assessment/Plan:  Bacteremia - Growing streptococcus agalactiae  - ID consulted and assisting with managment - currently on Rocephin and cefazolin antibiotic lock therapy for four more days per ID recs - has not spiked a fever in the last 24 hours  Leukocytosis - most likely due to bacteremia, trending down  Principal Problem:    BRBPR (bright red blood per rectum) - HGB steady near or at 9, will reassess next am - given steady hgb will d/c cardiac monitoring - No more bleeding reported  Active Problems:   Cerebral embolism with cerebral infarction - stable no new neurological deficits reported. - aspirin on hold due to GI bleed.    Hypokalemia - resolved on last check.    Acute GI bleeding - GI on board and patient is s/p flexible sigmoidoscopy with friable areas of the tumor obliterated and cauterized utilizing the argon plasma coagulator - aspirin  - reassess hgb levels next am.    Decreased appetite - Currently on Boost and on bland diet.    Weakness generalized - PT on board.    Palliative care encounter - Currently on board and will assist with planning  Code Status: full Family Communication: no family at bedside Disposition Plan: Pending improvement in condition and recommendations from specialist.   Consultants:  GI  Palliative  ID on board  Procedures:  As listed above  Antibiotics:  Rocephin  HPI/Subjective: Pt has no new complaints. No acute issues overnight.  Objective: Filed Vitals:   03/01/14 1429  BP: 101/80  Pulse: 105  Temp: 98.9 F (37.2 C)  Resp:     Intake/Output Summary (Last 24 hours) at 03/01/14 1555 Last data filed at 03/01/14 1432  Gross per 24 hour  Intake    600 ml  Output   2300 ml  Net  -1700 ml   Filed Weights   02/26/14 0457 02/27/14 0530 02/28/14 0541  Weight: 73.029 kg  (161 lb) 71.6 kg (157 lb 13.6 oz) 72.4 kg (159 lb 9.8 oz)    Exam:   General:  Pt in NAD, alert and awake  Cardiovascular: rrr, no murmurs  Respiratory: cta bl, no wheezes  Abdomen: + bowel sounds, soft, NT  Musculoskeletal: no cyanosis or clubbing   Data Reviewed: Basic Metabolic Panel:  Recent Labs Lab 02/24/14 0221  NA 133*  K 4.8  CL 96  CO2 30  GLUCOSE 111*  BUN 16  CREATININE 0.83  CALCIUM 7.0*   Liver Function Tests: No results for input(s): AST, ALT, ALKPHOS, BILITOT, PROT, ALBUMIN in the last 168 hours. No results for input(s): LIPASE, AMYLASE in the last 168 hours. No results for input(s): AMMONIA in the last 168 hours. CBC:  Recent Labs Lab 02/24/14 0200 02/24/14 0949 02/25/14 0428 02/26/14 0105 02/28/14 0835  WBC  --   --  19.4* 16.7* 11.4*  HGB 8.8* 9.3* 8.6* 9.3* 9.5*  HCT 27.1* 29.1* 27.3* 29.5* 30.2*  MCV  --   --  92.9 92.8 93.2  PLT  --   --  189 238 313   Cardiac Enzymes: No results for input(s): CKTOTAL, CKMB, CKMBINDEX, TROPONINI in the last 168 hours. BNP (last 3 results) No results for input(s): PROBNP in the last 8760 hours. CBG: No results for input(s): GLUCAP in the last 168 hours.  Recent Results (from the past 240 hour(s))  Culture, blood (routine x 2)  Status: None   Collection Time: 02/20/14  5:42 PM  Result Value Ref Range Status   Specimen Description BLOOD RIGHT ARM  Final   Special Requests BOTTLES DRAWN AEROBIC AND ANAEROBIC 10CC EACH  Final   Culture   Final    GROUP B STREP(S.AGALACTIAE)ISOLATED Note: SUSCEPTIBILITIES PERFORMED ON PREVIOUS CULTURE WITHIN THE LAST 5 DAYS. Note: Gram Stain Report Called to,Read Back By and Verified With: SUSAN@12 :50PM ON 02/21/14 BY DANTS Performed at [facility]    Report Status 02/23/2014 FINAL  Final  Culture, Urine     Status: None   Collection Time: 02/20/14  6:04 PM  Result Value Ref Range Status   Specimen Description URINE, CLEAN CATCH  Final   Special  Requests NONE  Final   Colony Count NO GROWTH Performed at [facility]   Final   Culture NO GROWTH Performed at [facility]   Final   Report Status 02/21/2014 FINAL  Final  Culture, blood (routine x 2)     Status: None   Collection Time: 02/20/14  6:40 PM  Result Value Ref Range Status   Specimen Description BLOOD RIGHT ARM  Final   Special Requests BOTTLES DRAWN AEROBIC AND ANAEROBIC 10CC EACH  Final   Culture   Final    GROUP B STREP(S.AGALACTIAE)ISOLATED Note: Gram Stain Report Called to,Read Back By and Verified With: SUSAN@12 :50PM ON 02/21/14 BY DANTS Performed at [facility]    Report Status 02/23/2014 FINAL  Final   Organism ID, Bacteria GROUP B STREP(S.AGALACTIAE)ISOLATED  Final      Susceptibility   Group b strep(s.agalactiae)isolated - MIC*    AMPICILLIN <=0.25 SENSITIVE Sensitive     PENICILLIN <=0.06 SENSITIVE Sensitive     CLINDAMYCIN 0.5 INTERMEDIATE Intermediate     ERYTHROMYCIN <=0.12 SENSITIVE Sensitive     VANCOMYCIN 0.5 SENSITIVE Sensitive     LEVOFLOXACIN 1 SENSITIVE Sensitive     * GROUP B STREP(S.AGALACTIAE)ISOLATED  Culture, blood (routine x 2)     Status: None (Preliminary result)   Collection Time: 02/25/14  5:50 PM  Result Value Ref Range Status   Specimen Description BLOOD RIGHT HAND  Final   Special Requests BOTTLES DRAWN AEROBIC ONLY 6CC  Final   Culture   Final           BLOOD CULTURE RECEIVED NO GROWTH TO DATE CULTURE WILL BE HELD FOR 5 DAYS BEFORE ISSUING A FINAL NEGATIVE REPORT Performed at [facility]    Report Status PENDING  Incomplete  Culture, blood (routine x 2)     Status: None (Preliminary result)   Collection Time: 02/25/14  6:00 PM  Result Value Ref Range Status   Specimen Description BLOOD RIGHT ARM  Final   Special Requests BOTTLES DRAWN AEROBIC ONLY 10CC  Final   Culture   Final           BLOOD CULTURE RECEIVED NO GROWTH TO DATE CULTURE WILL BE HELD FOR 5 DAYS BEFORE ISSUING A FINAL NEGATIVE  REPORT Performed at [facility]    Report Status PENDING  Incomplete     Studies: No results found.  Scheduled Meds: . cefazolin 5 mg/ml + heparin 2500 units/ml  2.5 mL Intracatheter Q24H  . cefTRIAXone (ROCEPHIN)  IV  2 g Intravenous Q24H  . dronabinol  5 mg Oral TID AC  . feeding supplement (ENSURE COMPLETE)  237 mL Oral BID BM  . folic acid  1 mg Oral Daily  . pantoprazole  40 mg  Oral BID  . pravastatin  20 mg Oral q1800  . saccharomyces boulardii  250 mg Oral BID  . sodium chloride  3 mL Intravenous Q12H  . traZODone  75 mg Oral QHS   Continuous Infusions:     Time spent: > 35 minutes    Velvet Bathe  [facility] Pager [phone] If 7PM-7AM, please contact night-coverage at [email or web address], password [facility] 03/01/2014, 3:55 PM  LOS: 11 days

## 2014-03-01 NOTE — Progress Notes (Addendum)
Physical Therapy Treatment Patient Details Name: [name] MRN: [id number] DOB: 1955/12/27 Today's Date: 03/01/2014    History of Present Illness [name] is a 59 y.o. male with Past medical history of colonic stent placement and a Port-A-Cath placement for metastatic colon cancer, ongoing with chemotherapy. Admitted with BRBPR.     PT Comments    Pt still unsteady at times and fatigues fairly easily with activity. Recommend daily ambulation in hallways with nursing assistance.   Follow Up Recommendations  Home Health PT (if possible)     Equipment Recommendations       Recommendations for Other Services       Precautions / Restrictions Precautions Precautions: None Restrictions Weight Bearing Restrictions: No    Mobility  Bed Mobility Overal bed mobility: Independent                Transfers                    Ambulation/Gait Ambulation/Gait assistance: Min assist Ambulation Distance (Feet): 250 Feet Assistive device: None;Cane Gait Pattern/deviations: Step-to pattern;Staggering left;Staggering right;Drifts right/left     General Gait Details: Intermittent assist needed to steady. Tried ambulation with cane-pt unable to get sequence down so did not practice any more with it. HR in 110s with activity. Dyspnea 2/4. Used cane for 1st 50 feet.    Stairs            Wheelchair Mobility    Modified Rankin (Stroke Patients Only)       Balance           Standing balance support: No upper extremity supported;During functional activity Standing balance-Leahy Scale: Fair                      Cognition Arousal/Alertness: Awake/alert Behavior During Therapy: WFL for tasks assessed/performed Overall Cognitive Status: Within Functional Limits for tasks assessed                      Exercises General Exercises - Lower Extremity Hip ABduction/ADduction: AROM;Both;10 reps;Standing Heel Raises: AROM;Both;10  reps;Standing Other Exercises Other Exercises: chair push-ups/sit to stands x 5    General Comments        Pertinent Vitals/Pain Pain Assessment: No/denies pain    Home Living                      Prior Function            PT Goals (current goals can now be found in the care plan section) Progress towards PT goals: Progressing toward goals (slowly)    Frequency  Min 3X/week    PT Plan Current plan remains appropriate    Co-evaluation             End of Session Equipment Utilized During Treatment: Gait belt Activity Tolerance: Patient limited by fatigue Patient left: in chair;with call bell/phone within reach     Time: 0916-0936 PT Time Calculation (min) (ACUTE ONLY): 20 min  Charges:  $Gait Training: 8-22 mins                    G Codes:      Weston Anna, MPT Pager: [phone]

## 2014-03-01 NOTE — Progress Notes (Signed)
CARE MANAGEMENT NOTE 03/01/2014  Patient:  [name], [name]   Account Number:  [id number]  Date Initiated:  02/19/2014  Documentation initiated by:  [facility]  Subjective/Objective Assessment:   adm: Bright red blood per rectum.     Action/Plan:   From home.   Anticipated DC Date:  03/04/2014   Anticipated DC Plan:  [name]  CM consult      Choice offered to / List presented to:  C-1 Patient           Status of service:  In process, will continue to follow Medicare Important Message given?   (If response is "NO", the following Medicare IM given date fields will be blank) Date Medicare IM given:   Medicare IM given by:   Date Additional Medicare IM given:   Additional Medicare IM given by:    Discharge Disposition:    Per UR Regulation:  Reviewed for med. necessity/level of care/duration of stay  If discussed at [employer] of Stay Meetings, dates discussed:   02/26/2014  02/28/2014    Comments:  03/01/14 Lars Mage BSN NCM [place] care.PT-HH, cane.HHPT not covered,but can have HHRN(safety eval), social worker-resources.[facility] chosen [facility] aware & following for [place] orders.  02/27/14 Dessa Phi RN BSN NCM [phone] ID-PAC-strep bacteremia-iv abx.Soft diet. Palliative-full code.d/C plan home.  02/22/14 Dessa Phi RN BSN NCM [phone] Palliative care following.PCCM-acute diastolic dysfunction-iv lasix x1-signed off.Monitor progress.  02/19/14 14:45 CM notes pt has had home health services in past provided by [facility]  Pt

## 2014-03-01 NOTE — Progress Notes (Signed)
Patient ID: [name], male   DOB: 05/23/1955, 59 y.o.   MRN: [id number]         [name] for [facility]ious Disease    Date of Admission:  02/18/2014   Total days of antibiotics 10          Principal Problem:   Streptococcal bacteremia   . cefazolin 5 mg/ml + heparin 2500 units/ml  2.5 mL Intracatheter Q24H  . cefTRIAXone (ROCEPHIN)  IV  2 g Intravenous Q24H  . dronabinol  5 mg Oral TID AC  . feeding supplement (ENSURE COMPLETE)  237 mL Oral BID BM  . folic acid  1 mg Oral Daily  . pantoprazole  40 mg Oral BID  . pravastatin  20 mg Oral q1800  . saccharomyces boulardii  250 mg Oral BID  . sodium chloride  3 mL Intravenous Q12H  . traZODone  75 mg Oral QHS    Subjective: He is feeling well. He has not had any further sweats. He is looking forward to his birthday in 3 days.  Review of Systems: Pertinent items are noted in HPI.  Past Medical History  Diagnosis Date  . Hypertension   . Cerebral embolism   . Malignant neoplasm of colon 12/30/2013    History  Substance Use Topics  . Smoking status: Never Smoker   . Smokeless tobacco: Never Used  . Alcohol Use: Yes    History reviewed. No pertinent family history. Allergies  Allergen Reactions  . Penicillins     childhood    OBJECTIVE: Blood pressure 156/82, pulse 96, temperature 99.2 F (37.3 C), temperature source Oral, resp. rate 18, height 5\' 10"  (1.778 m), weight 159 lb 9.8 oz (72.4 kg), SpO2 98 %.   General: He is alert and smiling watching TV Skin: No rash Lungs: Clear Cor: Regular S1 and S2 no murmurs Port-A-Cath site is normal Abdomen: Soft and nontender  Lab Results Lab Results  Component Value Date   WBC 11.4* 02/28/2014   HGB 9.5* 02/28/2014   HCT 30.2* 02/28/2014   MCV 93.2 02/28/2014   PLT 313 02/28/2014    Lab Results  Component Value Date   CREATININE 0.83 02/24/2014   BUN 16 02/24/2014   NA 133* 02/24/2014   K 4.8 02/24/2014   CL 96 02/24/2014   CO2 30 02/24/2014    Lab  Results  Component Value Date   ALT 11 02/19/2014   AST 29 02/19/2014   ALKPHOS 141* 02/19/2014   BILITOT 0.8 02/19/2014     Microbiology: Recent Results (from the past 240 hour(s))  Culture, blood (routine x 2)     Status: None   Collection Time: 02/20/14  5:42 PM  Result Value Ref Range Status   Specimen Description BLOOD RIGHT ARM  Final   Special Requests BOTTLES DRAWN AEROBIC AND ANAEROBIC 10CC EACH  Final   Culture   Final    GROUP B STREP(S.AGALACTIAE)ISOLATED Note: SUSCEPTIBILITIES PERFORMED ON PREVIOUS CULTURE WITHIN THE LAST 5 DAYS. Note: Gram Stain Report Called to,Read Back By and Verified With: SUSAN@12 :50PM ON 02/21/14 BY DANTS Performed at [facility]    Report Status 02/23/2014 FINAL  Final  Culture, Urine     Status: None   Collection Time: 02/20/14  6:04 PM  Result Value Ref Range Status   Specimen Description URINE, CLEAN CATCH  Final   Special Requests NONE  Final   Colony Count NO GROWTH Performed at [facility]   Final   Culture NO GROWTH  Performed at [facility]   Final   Report Status 02/21/2014 FINAL  Final  Culture, blood (routine x 2)     Status: None   Collection Time: 02/20/14  6:40 PM  Result Value Ref Range Status   Specimen Description BLOOD RIGHT ARM  Final   Special Requests BOTTLES DRAWN AEROBIC AND ANAEROBIC 10CC EACH  Final   Culture   Final    GROUP B STREP(S.AGALACTIAE)ISOLATED Note: Gram Stain Report Called to,Read Back By and Verified With: SUSAN@12 :50PM ON 02/21/14 BY DANTS Performed at [facility]    Report Status 02/23/2014 FINAL  Final   Organism ID, Bacteria GROUP B STREP(S.AGALACTIAE)ISOLATED  Final      Susceptibility   Group b strep(s.agalactiae)isolated - MIC*    AMPICILLIN <=0.25 SENSITIVE Sensitive     PENICILLIN <=0.06 SENSITIVE Sensitive     CLINDAMYCIN 0.5 INTERMEDIATE Intermediate     ERYTHROMYCIN <=0.12 SENSITIVE Sensitive     VANCOMYCIN 0.5 SENSITIVE Sensitive      LEVOFLOXACIN 1 SENSITIVE Sensitive     * GROUP B STREP(S.AGALACTIAE)ISOLATED  Culture, blood (routine x 2)     Status: None (Preliminary result)   Collection Time: 02/25/14  5:50 PM  Result Value Ref Range Status   Specimen Description BLOOD RIGHT HAND  Final   Special Requests BOTTLES DRAWN AEROBIC ONLY 6CC  Final   Culture   Final           BLOOD CULTURE RECEIVED NO GROWTH TO DATE CULTURE WILL BE HELD FOR 5 DAYS BEFORE ISSUING A FINAL NEGATIVE REPORT Performed at [facility]    Report Status PENDING  Incomplete  Culture, blood (routine x 2)     Status: None (Preliminary result)   Collection Time: 02/25/14  6:00 PM  Result Value Ref Range Status   Specimen Description BLOOD RIGHT ARM  Final   Special Requests BOTTLES DRAWN AEROBIC ONLY 10CC  Final   Culture   Final           BLOOD CULTURE RECEIVED NO GROWTH TO DATE CULTURE WILL BE HELD FOR 5 DAYS BEFORE ISSUING A FINAL NEGATIVE REPORT Performed at [facility]    Report Status PENDING  Incomplete    Assessment: He seems to be improving on therapy for group B streptococcal bacteremia.  Plan: 1. Continue IV ceftriaxone and cefazolin antibiotic lock therapy for 4 more days 2. Please call Dr. Lita Mains [phone]) for any infectious disease questions this weekend  Cody Bickers, MD [name] [phone] pager   [phone] cell 03/01/2014, 12:24 PM

## 2014-03-02 LAB — CBC
HCT: 29.5 % — ABNORMAL LOW (ref 39.0–52.0)
Hemoglobin: 9.3 g/dL — ABNORMAL LOW (ref 13.0–17.0)
MCH: 29.2 pg (ref 26.0–34.0)
MCHC: 31.5 g/dL (ref 30.0–36.0)
MCV: 92.8 fL (ref 78.0–100.0)
PLATELETS: 350 10*3/uL (ref 150–400)
RBC: 3.18 MIL/uL — ABNORMAL LOW (ref 4.22–5.81)
RDW: 19 % — AB (ref 11.5–15.5)
WBC: 11.5 10*3/uL — ABNORMAL HIGH (ref 4.0–10.5)

## 2014-03-02 NOTE — Progress Notes (Signed)
[facility] PROGRESS NOTE  [name] DZH:[id number] DOB: [birth year] DOA: 02/18/2014 PCP: No primary care provider on file.  Assessment/Plan:  Bacteremia - Growing streptococcus agalactiae  - ID consulted and assisting with managment - currently on Rocephin and cefazolin antibiotic lock therapy for 3 more days per ID recs - has not spiked a fever in the last 24 hours  Leukocytosis - most likely due to bacteremia, trending down  Principal Problem:    BRBPR (bright red blood per rectum) - HGB steady near or at 9 - given steady hgb will d/c cardiac monitoring - No more bleeding reported  Active Problems:   Cerebral embolism with cerebral infarction - stable no new neurological deficits reported. - aspirin on hold due to GI bleed.    Hypokalemia - resolved on last check.    Acute GI bleeding - GI on board and patient is s/p flexible sigmoidoscopy with friable areas of the tumor obliterated and cauterized utilizing the argon plasma coagulator - aspirin  - reassess hgb levels next am.    Decreased appetite - Currently on Boost and on bland diet.    Weakness generalized - PT on board.    Palliative care encounter - Currently on board and will assist with planning  Code Status: full Family Communication: no family at bedside Disposition Plan: Pending improvement in condition and recommendations from specialist.   Consultants:  GI  Palliative  ID on board  Procedures:  As listed above  Antibiotics:  Rocephin  HPI/Subjective: Pt has no new complaints. No acute issues overnight.  Objective: Filed Vitals:   03/02/14 0443  BP: 112/86  Pulse: 106  Temp: 99.3 F (37.4 C)  Resp: 18    Intake/Output Summary (Last 24 hours) at 03/02/14 1423 Last data filed at 03/02/14 0900  Gross per 24 hour  Intake   1250 ml  Output   4000 ml  Net  -2750 ml   Filed Weights   02/27/14 0530 02/28/14 0541 03/02/14 0443  Weight: 71.6 kg (157 lb 13.6 oz) 72.4 kg  (159 lb 9.8 oz) 73.1 kg (161 lb 2.5 oz)    Exam:   General:  Pt in NAD, alert and awake  Cardiovascular: rrr, no murmurs  Respiratory: cta bl, no wheezes  Abdomen: + bowel sounds, soft, NT  Musculoskeletal: no cyanosis or clubbing   Data Reviewed: Basic Metabolic Panel:  Recent Labs Lab 02/24/14 0221  NA 133*  K 4.8  CL 96  CO2 30  GLUCOSE 111*  BUN 16  CREATININE 0.83  CALCIUM 7.0*   Liver Function Tests: No results for input(s): AST, ALT, ALKPHOS, BILITOT, PROT, ALBUMIN in the last 168 hours. No results for input(s): LIPASE, AMYLASE in the last 168 hours. No results for input(s): AMMONIA in the last 168 hours. CBC:  Recent Labs Lab 02/24/14 0949 02/25/14 0428 02/26/14 0105 02/28/14 0835 03/02/14 0530  WBC  --  19.4* 16.7* 11.4* 11.5*  HGB 9.3* 8.6* 9.3* 9.5* 9.3*  HCT 29.1* 27.3* 29.5* 30.2* 29.5*  MCV  --  92.9 92.8 93.2 92.8  PLT  --  189 238 313 350   Cardiac Enzymes: No results for input(s): CKTOTAL, CKMB, CKMBINDEX, TROPONINI in the last 168 hours. BNP (last 3 results) No results for input(s): PROBNP in the last 8760 hours. CBG: No results for input(s): GLUCAP in the last 168 hours.  Recent Results (from the past 240 hour(s))  Culture, blood (routine x 2)     Status: None   Collection Time: 02/20/14  5:42 PM  Result Value Ref Range Status   Specimen Description BLOOD RIGHT ARM  Final   Special Requests BOTTLES DRAWN AEROBIC AND ANAEROBIC 10CC EACH  Final   Culture   Final    GROUP B STREP(S.AGALACTIAE)ISOLATED Note: SUSCEPTIBILITIES PERFORMED ON PREVIOUS CULTURE WITHIN THE LAST 5 DAYS. Note: Gram Stain Report Called to,Read Back By and Verified With: SUSAN@12 :50PM ON 02/21/14 BY DANTS Performed at [facility]    Report Status 02/23/2014 FINAL  Final  Culture, Urine     Status: None   Collection Time: 02/20/14  6:04 PM  Result Value Ref Range Status   Specimen Description URINE, CLEAN CATCH  Final   Special Requests NONE  Final    Colony Count NO GROWTH Performed at [facility]   Final   Culture NO GROWTH Performed at [facility]   Final   Report Status 02/21/2014 FINAL  Final  Culture, blood (routine x 2)     Status: None   Collection Time: 02/20/14  6:40 PM  Result Value Ref Range Status   Specimen Description BLOOD RIGHT ARM  Final   Special Requests BOTTLES DRAWN AEROBIC AND ANAEROBIC 10CC EACH  Final   Culture   Final    GROUP B STREP(S.AGALACTIAE)ISOLATED Note: Gram Stain Report Called to,Read Back By and Verified With: SUSAN@12 :50PM ON 02/21/14 BY DANTS Performed at [facility]    Report Status 02/23/2014 FINAL  Final   Organism ID, Bacteria GROUP B STREP(S.AGALACTIAE)ISOLATED  Final      Susceptibility   Group b strep(s.agalactiae)isolated - MIC*    AMPICILLIN <=0.25 SENSITIVE Sensitive     PENICILLIN <=0.06 SENSITIVE Sensitive     CLINDAMYCIN 0.5 INTERMEDIATE Intermediate     ERYTHROMYCIN <=0.12 SENSITIVE Sensitive     VANCOMYCIN 0.5 SENSITIVE Sensitive     LEVOFLOXACIN 1 SENSITIVE Sensitive     * GROUP B STREP(S.AGALACTIAE)ISOLATED  Culture, blood (routine x 2)     Status: None (Preliminary result)   Collection Time: 02/25/14  5:50 PM  Result Value Ref Range Status   Specimen Description BLOOD RIGHT HAND  Final   Special Requests BOTTLES DRAWN AEROBIC ONLY 6CC  Final   Culture   Final           BLOOD CULTURE RECEIVED NO GROWTH TO DATE CULTURE WILL BE HELD FOR 5 DAYS BEFORE ISSUING A FINAL NEGATIVE REPORT Performed at [facility]    Report Status PENDING  Incomplete  Culture, blood (routine x 2)     Status: None (Preliminary result)   Collection Time: 02/25/14  6:00 PM  Result Value Ref Range Status   Specimen Description BLOOD RIGHT ARM  Final   Special Requests BOTTLES DRAWN AEROBIC ONLY 10CC  Final   Culture   Final           BLOOD CULTURE RECEIVED NO GROWTH TO DATE CULTURE WILL BE HELD FOR 5 DAYS BEFORE ISSUING A FINAL NEGATIVE REPORT Performed at  [facility]    Report Status PENDING  Incomplete     Studies: No results found.  Scheduled Meds: . cefazolin 5 mg/ml + heparin 2500 units/ml  2.5 mL Intracatheter Q24H  . cefTRIAXone (ROCEPHIN)  IV  2 g Intravenous Q24H  . dronabinol  5 mg Oral TID AC  . feeding supplement (ENSURE COMPLETE)  237 mL Oral BID BM  . folic acid  1 mg Oral Daily  . pantoprazole  40 mg Oral BID  . pravastatin  20 mg  Oral q1800  . saccharomyces boulardii  250 mg Oral BID  . sodium chloride  3 mL Intravenous Q12H  . traZODone  75 mg Oral QHS   Continuous Infusions:     Time spent: > 35 minutes    Velvet Bathe  [facility] Pager [phone] If 7PM-7AM, please contact night-coverage at [email or web address], password [facility] 03/02/2014, 2:23 PM  LOS: 12 days

## 2014-03-03 LAB — CULTURE, BLOOD (ROUTINE X 2)
CULTURE: NO GROWTH
Culture: NO GROWTH

## 2014-03-03 NOTE — Progress Notes (Signed)
[facility] PROGRESS NOTE  [place] DGU:[id number] DOB: 1955-04-07 DOA: 02/18/2014 PCP: No primary care provider on file.  Assessment/Plan:  Bacteremia - Growing streptococcus agalactiae  - ID consulted and assisting with managment - currently on Rocephin and cefazolin antibiotic lock therapy for 2 more days per ID recs - Spiked fever within last 24 hours, consideration will be made to remove port  Leukocytosis - most likely due to bacteremia, trending down  Principal Problem:    BRBPR (bright red blood per rectum) - HGB steady near or at 9 - given steady hgb will d/c cardiac monitoring - No more bleeding reported  Active Problems:   Cerebral embolism with cerebral infarction - stable no new neurological deficits reported. - aspirin on hold due to GI bleed.    Hypokalemia - resolved on last check.    Acute GI bleeding - GI on board and patient is s/p flexible sigmoidoscopy with friable areas of the tumor obliterated and cauterized utilizing the argon plasma coagulator - aspirin  - reassess hgb levels next am.    Decreased appetite - Currently on Boost and on bland diet.    Weakness generalized - PT on board.    Palliative care encounter - Currently on board and will assist with planning  Code Status: full Family Communication: no family at bedside Disposition Plan: Pending improvement in condition and recommendations from specialist.   Consultants:  GI  Palliative  ID on board  Procedures:  As listed above  Antibiotics:  Rocephin  HPI/Subjective: Pt has no new complaints. No acute issues overnight.  Objective: Filed Vitals:   03/03/14 0523  BP: 142/90  Pulse: 88  Temp: 98.6 F (37 C)  Resp: 18    Intake/Output Summary (Last 24 hours) at 03/03/14 1236 Last data filed at 03/03/14 0524  Gross per 24 hour  Intake    837 ml  Output   1300 ml  Net   -463 ml   Filed Weights   02/28/14 0541 03/02/14 0443 03/03/14 0523  Weight:  72.4 kg (159 lb 9.8 oz) 73.1 kg (161 lb 2.5 oz) 71.9 kg (158 lb 8.2 oz)    Exam:   General:  Pt in NAD, alert and awake  Cardiovascular: rrr, no murmurs  Respiratory: cta bl, no wheezes  Abdomen: + bowel sounds, soft, NT  Musculoskeletal: no cyanosis or clubbing   Data Reviewed: Basic Metabolic Panel: No results for input(s): NA, K, CL, CO2, GLUCOSE, BUN, CREATININE, CALCIUM, MG, PHOS in the last 168 hours. Liver Function Tests: No results for input(s): AST, ALT, ALKPHOS, BILITOT, PROT, ALBUMIN in the last 168 hours. No results for input(s): LIPASE, AMYLASE in the last 168 hours. No results for input(s): AMMONIA in the last 168 hours. CBC:  Recent Labs Lab 02/25/14 0428 02/26/14 0105 02/28/14 0835 03/02/14 0530  WBC 19.4* 16.7* 11.4* 11.5*  HGB 8.6* 9.3* 9.5* 9.3*  HCT 27.3* 29.5* 30.2* 29.5*  MCV 92.9 92.8 93.2 92.8  PLT 189 238 313 350   Cardiac Enzymes: No results for input(s): CKTOTAL, CKMB, CKMBINDEX, TROPONINI in the last 168 hours. BNP (last 3 results) No results for input(s): PROBNP in the last 8760 hours. CBG: No results for input(s): GLUCAP in the last 168 hours.  Recent Results (from the past 240 hour(s))  Culture, blood (routine x 2)     Status: None (Preliminary result)   Collection Time: 02/25/14  5:50 PM  Result Value Ref Range Status   Specimen Description BLOOD RIGHT HAND  Final  Special Requests BOTTLES DRAWN AEROBIC ONLY 6CC  Final   Culture   Final           BLOOD CULTURE RECEIVED NO GROWTH TO DATE CULTURE WILL BE HELD FOR 5 DAYS BEFORE ISSUING A FINAL NEGATIVE REPORT Performed at [facility]    Report Status PENDING  Incomplete  Culture, blood (routine x 2)     Status: None (Preliminary result)   Collection Time: 02/25/14  6:00 PM  Result Value Ref Range Status   Specimen Description BLOOD RIGHT ARM  Final   Special Requests BOTTLES DRAWN AEROBIC ONLY 10CC  Final   Culture   Final           BLOOD CULTURE RECEIVED NO GROWTH TO  DATE CULTURE WILL BE HELD FOR 5 DAYS BEFORE ISSUING A FINAL NEGATIVE REPORT Performed at [facility]    Report Status PENDING  Incomplete     Studies: No results found.  Scheduled Meds: . cefazolin 5 mg/ml + heparin 2500 units/ml  2.5 mL Intracatheter Q24H  . cefTRIAXone (ROCEPHIN)  IV  2 g Intravenous Q24H  . dronabinol  5 mg Oral TID AC  . feeding supplement (ENSURE COMPLETE)  237 mL Oral BID BM  . folic acid  1 mg Oral Daily  . pantoprazole  40 mg Oral BID  . pravastatin  20 mg Oral q1800  . saccharomyces boulardii  250 mg Oral BID  . sodium chloride  3 mL Intravenous Q12H  . traZODone  75 mg Oral QHS   Continuous Infusions:     Time spent: > 35 minutes    Velvet Bathe  [facility] Pager [phone] If 7PM-7AM, please contact night-coverage at [email or web address], password [facility] 03/03/2014, 12:36 PM  LOS: 13 days

## 2014-03-04 DIAGNOSIS — Z9689 Presence of other specified functional implants: Secondary | ICD-10-CM

## 2014-03-04 MED ORDER — DIPHENHYDRAMINE HCL 25 MG PO CAPS
25.0000 mg | ORAL_CAPSULE | Freq: Four times a day (QID) | ORAL | Status: DC | PRN
  Administered 2014-03-04 – 2014-03-07 (×2): 25 mg via ORAL
  Filled 2014-03-04 (×2): qty 1

## 2014-03-04 MED ORDER — CAMPHOR-MENTHOL 0.5-0.5 % EX LOTN
TOPICAL_LOTION | CUTANEOUS | Status: DC | PRN
  Administered 2014-03-04 – 2014-03-07 (×2): via TOPICAL
  Filled 2014-03-04: qty 222

## 2014-03-04 NOTE — Progress Notes (Signed)
Patient ID: [name], male   DOB: Jul 30, 1955, 59 y.o.   MRN: [id number]         [facility] for Infectious Disease    Date of Admission:  02/18/2014   Total days of antibiotics 13          Principal Problem:   Streptococcal bacteremia   . cefazolin 5 mg/ml + heparin 2500 units/ml  2.5 mL Intracatheter Q24H  . cefTRIAXone (ROCEPHIN)  IV  2 g Intravenous Q24H  . dronabinol  5 mg Oral TID AC  . feeding supplement (ENSURE COMPLETE)  237 mL Oral BID BM  . folic acid  1 mg Oral Daily  . pantoprazole  40 mg Oral BID  . pravastatin  20 mg Oral q1800  . saccharomyces boulardii  250 mg Oral BID  . sodium chloride  3 mL Intravenous Q12H  . traZODone  75 mg Oral QHS    Subjective: He had another episode of fever to 102.4 2 days ago associated with sweats. He denies any other new problems. He is not having any diarrhea. Today is his birthday.  Review of Systems: Pertinent items are noted in HPI.  Past Medical History  Diagnosis Date  . Hypertension   . Cerebral embolism   . Malignant neoplasm of colon 12/30/2013    History  Substance Use Topics  . Smoking status: Never Smoker   . Smokeless tobacco: Never Used  . Alcohol Use: Yes    History reviewed. No pertinent family history. Allergies  Allergen Reactions  . Penicillins     childhood    OBJECTIVE: Blood pressure 113/64, pulse 93, temperature 99.3 F (37.4 C), temperature source Oral, resp. rate 18, height 5\' 10"  (1.778 m), weight 157 lb 12.8 oz (71.578 kg), SpO2 99 %.   General: He is alert, smiling and in relatively good spirits Skin: No rash Lungs: Clear Cor: Regular S1 and S2 no murmurs Port-A-Cath site is normal Abdomen: Soft and nontender  Lab Results Lab Results  Component Value Date   WBC 11.5* 03/02/2014   HGB 9.3* 03/02/2014   HCT 29.5* 03/02/2014   MCV 92.8 03/02/2014   PLT 350 03/02/2014    Lab Results  Component Value Date   CREATININE 0.83 02/24/2014   BUN 16 02/24/2014   NA 133*  02/24/2014   K 4.8 02/24/2014   CL 96 02/24/2014   CO2 30 02/24/2014    Lab Results  Component Value Date   ALT 11 02/19/2014   AST 29 02/19/2014   ALKPHOS 141* 02/19/2014   BILITOT 0.8 02/19/2014     Microbiology: Recent Results (from the past 240 hour(s))  Culture, blood (routine x 2)     Status: None   Collection Time: 02/25/14  5:50 PM  Result Value Ref Range Status   Specimen Description BLOOD RIGHT HAND  Final   Special Requests BOTTLES DRAWN AEROBIC ONLY [place]  Final   Culture   Final    NO GROWTH 5 DAYS Performed at [facility]    Report Status 03/03/2014 FINAL  Final  Culture, blood (routine x 2)     Status: None   Collection Time: 02/25/14  6:00 PM  Result Value Ref Range Status   Specimen Description BLOOD RIGHT ARM  Final   Special Requests BOTTLES DRAWN AEROBIC ONLY 10CC  Final   Culture   Final    NO GROWTH 5 DAYS Performed at [facility]    Report Status 03/03/2014 FINAL  Final  Assessment: Repeat blood cultures are negative but I'm concerned that his intermittent fevers could be due to ongoing infection of his Port-A-Cath with group B strep. If he continues to have fever he should have repeat blood cultures and Port-A-Cath removal. Dr. Ralene Ok placed the Port-A-Cath last November.  Plan: 1. Continue IV ceftriaxone and cefazolin antibiotic lock therapy  2. Port-A-Cath removal if fevers recur  Michel Bickers, MD [facility] [place] [phone] pager   [phone] cell 03/04/2014, 1:07 PM

## 2014-03-04 NOTE — Progress Notes (Signed)
[facility] PROGRESS NOTE  [name] ZOX:[id number] DOB: 1955-08-20 DOA: 02/18/2014 PCP: No primary care provider on file.  Assessment/Plan:  Bacteremia - Growing streptococcus agalactiae  - ID consulted and assisting with managment - Currently on Rocephin and cefazolin antibiotic lock therapy - If fever recurs will d/c port a cath  Leukocytosis - most likely due to bacteremia, trending down  Principal Problem:    BRBPR (bright red blood per rectum) - HGB steady near or at 9 - given steady hgb will d/c cardiac monitoring - No more bleeding reported  Active Problems:   Cerebral embolism with cerebral infarction - stable no new neurological deficits reported. - aspirin on hold due to GI bleed.    Hypokalemia - resolved on last check.    Acute GI bleeding - GI on board and patient is s/p flexible sigmoidoscopy with friable areas of the tumor obliterated and cauterized utilizing the argon plasma coagulator - aspirin  - reassess hgb levels next am.    Decreased appetite - Currently on Boost and on bland diet.    Weakness generalized - PT on board.    Palliative care encounter - Currently on board and will assist with planning  Code Status: full Family Communication: no family at bedside Disposition Plan: Pending improvement in condition and recommendations from specialist.   Consultants:  GI  Palliative  ID on board  Procedures:  As listed above  Antibiotics:  Rocephin  HPI/Subjective: Pt has no new complaints. No acute issues overnight.  Objective: Filed Vitals:   03/04/14 1448  BP: 102/74  Pulse: 100  Temp: 99.2 F (37.3 C)  Resp: 18    Intake/Output Summary (Last 24 hours) at 03/04/14 1523 Last data filed at 03/04/14 1449  Gross per 24 hour  Intake    360 ml  Output   1200 ml  Net   -840 ml   Filed Weights   03/02/14 0443 03/03/14 0523 03/04/14 0511  Weight: 73.1 kg (161 lb 2.5 oz) 71.9 kg (158 lb 8.2 oz) 71.578 kg (157 lb  12.8 oz)    Exam:   General:  Pt in NAD, alert and awake  Cardiovascular: rrr, no murmurs  Respiratory: cta bl, no wheezes  Abdomen: + bowel sounds, soft, NT  Musculoskeletal: no cyanosis or clubbing   Data Reviewed: Basic Metabolic Panel: No results for input(s): NA, K, CL, CO2, GLUCOSE, BUN, CREATININE, CALCIUM, MG, PHOS in the last 168 hours. Liver Function Tests: No results for input(s): AST, ALT, ALKPHOS, BILITOT, PROT, ALBUMIN in the last 168 hours. No results for input(s): LIPASE, AMYLASE in the last 168 hours. No results for input(s): AMMONIA in the last 168 hours. CBC:  Recent Labs Lab 02/26/14 0105 02/28/14 0835 03/02/14 0530  WBC 16.7* 11.4* 11.5*  HGB 9.3* 9.5* 9.3*  HCT 29.5* 30.2* 29.5*  MCV 92.8 93.2 92.8  PLT 238 313 350   Cardiac Enzymes: No results for input(s): CKTOTAL, CKMB, CKMBINDEX, TROPONINI in the last 168 hours. BNP (last 3 results) No results for input(s): PROBNP in the last 8760 hours. CBG: No results for input(s): GLUCAP in the last 168 hours.  Recent Results (from the past 240 hour(s))  Culture, blood (routine x 2)     Status: None   Collection Time: 02/25/14  5:50 PM  Result Value Ref Range Status   Specimen Description BLOOD RIGHT HAND  Final   Special Requests BOTTLES DRAWN AEROBIC ONLY [place]  Final   Culture   Final    NO GROWTH  5 DAYS Performed at [facility]    Report Status 03/03/2014 FINAL  Final  Culture, blood (routine x 2)     Status: None   Collection Time: 02/25/14  6:00 PM  Result Value Ref Range Status   Specimen Description BLOOD RIGHT ARM  Final   Special Requests BOTTLES DRAWN AEROBIC ONLY [facility]CC  Final   Culture   Final    NO GROWTH 5 DAYS Performed at [facility]    Report Status 03/03/2014 FINAL  Final     Studies: No results found.  Scheduled Meds: . cefazolin 5 mg/ml + heparin 2500 units/ml  2.5 mL Intracatheter Q24H  . cefTRIAXone (ROCEPHIN)  IV  2 g Intravenous Q24H  .  dronabinol  5 mg Oral TID AC  . feeding supplement (ENSURE COMPLETE)  237 mL Oral BID BM  . folic acid  1 mg Oral Daily  . pantoprazole  40 mg Oral BID  . pravastatin  20 mg Oral q1800  . saccharomyces boulardii  250 mg Oral BID  . sodium chloride  3 mL Intravenous Q12H  . traZODone  75 mg Oral QHS   Continuous Infusions:     Time spent: > 35 minutes    Velvet Bathe  [facility] Pager [phone] If 7PM-7AM, please contact night-coverage at [email or web address], password [facility] 03/04/2014, 3:23 PM  LOS: 14 days

## 2014-03-04 NOTE — Progress Notes (Signed)
Physical Therapy Treatment Patient Details Name: [name] MRN: [id number] DOB: [birth year] Today's Date: 03/04/2014    History of Present Illness [name] is a 59 y.o. male with Past medical history of colonic stent placement and a Port-A-Cath placement for metastatic colon cancer, ongoing with chemotherapy. Admitted with BRBPR.     PT Comments    Progressing slowly with mobility. Encouraged pt to mobilize more with nursing supervision.  Follow Up Recommendations  Home health PT (per CM note HHPT not an option)     Equipment Recommendations       Recommendations for Other Services       Precautions / Restrictions Precautions Precautions: Fall Restrictions Weight Bearing Restrictions: No    Mobility  Bed Mobility Overal bed mobility: Modified Independent                Transfers Overall transfer level: Modified independent                  Ambulation/Gait Ambulation/Gait assistance: Min assist Ambulation Distance (Feet): 325 Feet Assistive device: None Gait Pattern/deviations: Step-through pattern;Staggering left;Staggering right;Drifts right/left     General Gait Details: Intermittent assist needed to steady. Dyspnea 2/4. Brief standing rest breaks needed during walk.    Stairs            Wheelchair Mobility    Modified Rankin (Stroke Patients Only)       Balance                                    Cognition Arousal/Alertness: Awake/alert Behavior During Therapy: WFL for tasks assessed/performed Overall Cognitive Status: Within Functional Limits for tasks assessed                      Exercises General Exercises - Lower Extremity Hip ABduction/ADduction: AROM;Both;10 reps;Standing Hip Flexion/Marching: AROM;Both;10 reps;Standing Heel Raises: AROM;Both;10 reps;Standing Other Exercises Other Exercises: sit to stands x 5    General Comments        Pertinent Vitals/Pain Pain Assessment: 0-10 Pain  Score: 6  Pain Location: R hip/gluteal area Pain Descriptors / Indicators: Aching;Sore Pain Intervention(s): Monitored during session;Patient requesting pain meds-RN notified    Home Living                      Prior Function            PT Goals (current goals can now be found in the care plan section) Progress towards PT goals: Progressing toward goals    Frequency  Min 3X/week    PT Plan Current plan remains appropriate    Co-evaluation             End of Session Equipment Utilized During Treatment: Gait belt Activity Tolerance: Patient limited by fatigue Patient left: in chair;with call bell/phone within reach     Time: 1110-1136 PT Time Calculation (min) (ACUTE ONLY): 26 min  Charges:  $Gait Training: 8-22 mins $Therapeutic Exercise: 8-22 mins                    G Codes:      [name], MPT Pager: [phone]

## 2014-03-04 NOTE — Progress Notes (Signed)
[name] is lying in bed - he tells me that today is his birthday. He also says he is happy to be alive. He receives joy from speaking with his family and says he has spoken with his siblings and children already this morning. He always smiles when he talks of his family and his daughters especially. He tells me that he is sleeping better but is having sweats with his fevers. Intake is still poor but he loves Ensure.   Vinie Sill, NP Palliative Medicine Team Pager # [phone] (M-F 8a-5p) Team Phone # [phone] (Nights/Weekends)

## 2014-03-05 DIAGNOSIS — R509 Fever, unspecified: Secondary | ICD-10-CM | POA: Insufficient documentation

## 2014-03-05 MED ORDER — OXYCODONE HCL 5 MG PO TABS
10.0000 mg | ORAL_TABLET | Freq: Once | ORAL | Status: AC
  Administered 2014-03-05: 10 mg via ORAL
  Filled 2014-03-05: qty 2

## 2014-03-05 MED ORDER — MORPHINE SULFATE 2 MG/ML IJ SOLN: 1.0000 mg | Freq: Once | INTRAMUSCULAR | Status: DC

## 2014-03-05 MED ORDER — ENSURE COMPLETE PO LIQD
237.0000 mL | Freq: Three times a day (TID) | ORAL | Status: DC
  Administered 2014-03-05 – 2014-03-09 (×14): 237 mL via ORAL

## 2014-03-05 NOTE — Progress Notes (Signed)
NUTRITION FOLLOW UP  Intervention:   -Continue with Ensure Complete TID -Add Magic cup TID with meals, each supplement provides 290 kcal and 9 grams of protein -Encouraged intake of meal and supplement intake -RD to continue to monitor  Nutrition Dx:   Increased nutrient (protein) needs related to colon cancer with mets as evidenced by 9% weight loss x 1 month; ongoing  Goal:   Pt to meet >/= 90% of their estimated nutrition needs; progressing  Monitor:   Total protein/energy intake, labs, weights  Assessment:   1/12: Pt recently assessed/followed by RDs in previous admission (11/20-12/03). Pt was diagnosed with severe malnutrition.  Per PA note, pt will be advanced to a CL diet. When diet is advanced, pt would like to receive Boost/Ensure supplements. RD will order when appropriate.   Pt reports very poor appetite for a couple of weeks associated with chemo and diarrhea symptoms. States he hasn't been able to eat much.  Per weight history documentation, pt has lost 15 lb since December (9% weight loss x 1 month, significant for time frame).  1/19: -Diet advanced to 1/14 -Pt reported appetite improving, eating 75% of meals. Receiving Marinol 5 mg TID. -Consuming Ensure Complete 2-3 times daily; does not like taste of Boost -Pt denied taste changes/nausea/abd pain post meals or supplements -Provided pt with nutrition supplement coupons to assist in supplement compliance on d/c -Wt increased 15 lbs in one week, likely r/t to fluid fluctuations as well as increased nutrient intake  1/26: - Pt reports that he has not been eating much at meals. He is drinking ensure complete three or more times per day. He says that he only has an appetite for Ensure. Pt says that he does like ice cream. Will order Magic cups on trays.   Labs reviewed   Height: Ht Readings from Last 1 Encounters:  02/19/14 5\' 10"  (1.778 m)    Weight Status:   Wt Readings from Last 1 Encounters:  03/05/14  159 lb 13.3 oz (72.5 kg)    Re-estimated needs:  Kcal: 2000-2200 Protein: 100-110g Fluid: 2L/day  Skin: WDL  Diet Order: DIET SOFT   Intake/Output Summary (Last 24 hours) at 03/05/14 1419 Last data filed at 03/05/14 1259  Gross per 24 hour  Intake    620 ml  Output   3675 ml  Net  -3055 ml    Last BM: 1/24    Labs:  No results for input(s): NA, K, CL, CO2, BUN, CREATININE, CALCIUM, MG, PHOS, GLUCOSE in the last 168 hours.  CBG (last 3)  No results for input(s): GLUCAP in the last 72 hours.  Scheduled Meds: . cefazolin 5 mg/ml + heparin 2500 units/ml  2.5 mL Intracatheter Q24H  . cefTRIAXone (ROCEPHIN)  IV  2 g Intravenous Q24H  . dronabinol  5 mg Oral TID AC  . feeding supplement (ENSURE COMPLETE)  237 mL Oral BID BM  . folic acid  1 mg Oral Daily  . pantoprazole  40 mg Oral BID  . pravastatin  20 mg Oral q1800  . saccharomyces boulardii  250 mg Oral BID  . sodium chloride  3 mL Intravenous Q12H  . traZODone  75 mg Oral QHS    Continuous Infusions:   Laurette Schimke MS, [employer], LDN [phone]

## 2014-03-05 NOTE — Progress Notes (Signed)
Report given to Josph Macho, RN on 3W. All questions answered.   Patient transported via wheelchair by NT to 3W.

## 2014-03-05 NOTE — Progress Notes (Signed)
Patient ID: [name], male   DOB: 27-Jun-1955, 59 y.o.   MRN: [id number]         [facility]    Date of Admission:  02/18/2014   Total days of antibiotics 14          Principal Problem:   Streptococcal bacteremia   . cefazolin 5 mg/ml + heparin 2500 units/ml  2.5 mL Intracatheter Q24H  . cefTRIAXone (ROCEPHIN)  IV  2 g Intravenous Q24H  . dronabinol  5 mg Oral TID AC  . feeding supplement (ENSURE COMPLETE)  237 mL Oral TID BM  . folic acid  1 mg Oral Daily  . pantoprazole  40 mg Oral BID  . pravastatin  20 mg Oral q1800  . saccharomyces boulardii  250 mg Oral BID  . sodium chloride  3 mL Intravenous Q12H  . traZODone  75 mg Oral QHS    Subjective: [name] had another episode of fever to 102.8. [name] has sweats during his febrile episodes but denies other new problems. [name] does not have any diarrhea, rash, itching or abdominal pain.  Review of Systems: Pertinent items are noted in HPI.  Past Medical History  Diagnosis Date  . Hypertension   . Cerebral embolism   . Malignant neoplasm of colon 12/30/2013    History  Substance Use Topics  . Smoking status: Never Smoker   . Smokeless tobacco: Never Used  . Alcohol Use: Yes    History reviewed. No pertinent family history. Allergies  Allergen Reactions  . Penicillins     childhood    OBJECTIVE: Blood pressure 106/78, pulse 100, temperature 99.9 F (37.7 C), temperature source Oral, resp. rate 19, height 5\' 10"  (1.778 m), weight 159 lb (72.122 kg), SpO2 97 %.   General: [name] is alert, smiling and in good spirits Skin: No rash Lungs: Clear Cor: Regular S1 and S2 no murmurs Port-A-Cath site is normal Abdomen: Soft and nontender  Lab Results Lab Results  Component Value Date   WBC 11.5* 03/02/2014   HGB 9.3* 03/02/2014   HCT 29.5* 03/02/2014   MCV 92.8 03/02/2014   PLT 350 03/02/2014    Lab Results  Component Value Date   CREATININE 0.83 02/24/2014   BUN 16 02/24/2014   NA 133*  02/24/2014   K 4.8 02/24/2014   CL 96 02/24/2014   CO2 30 02/24/2014    Lab Results  Component Value Date   ALT 11 02/19/2014   AST 29 02/19/2014   ALKPHOS 141* 02/19/2014   BILITOT 0.8 02/19/2014     Microbiology: Recent Results (from the past 240 hour(s))  Culture, blood (routine x 2)     Status: None   Collection Time: 02/25/14  5:50 PM  Result Value Ref Range Status   Specimen Description BLOOD RIGHT HAND  Final   Special Requests BOTTLES DRAWN AEROBIC ONLY [place]  Final   Culture   Final    NO GROWTH 5 DAYS Performed at [facility]    Report Status 03/03/2014 FINAL  Final  Culture, blood (routine x 2)     Status: None   Collection Time: 02/25/14  6:00 PM  Result Value Ref Range Status   Specimen Description BLOOD RIGHT ARM  Final   Special Requests BOTTLES DRAWN AEROBIC ONLY [facility]  Final   Culture   Final    NO GROWTH 5 DAYS Performed at [facility]    Report Status 03/03/2014 FINAL  Final    Assessment:  [name] has persistent fevers despite 2 weeks of systemic IV antibiotics and antibiotic lock therapy through his Port-A-Cath for group B streptococcal bacteremia. I am most concerned about Port-A-Cath infection and would recommend asking general surgery to remove it. I will also repeat blood cultures now.  Plan: 1. Continue IV ceftriaxone and cefazolin antibiotic lock therapy for now 2. Recommend Port-A-Cath removal repeat blood cultures 3.   Michel Bickers, MD [facility] [phone] pager   [phone] cell 03/05/2014, 4:28 PM

## 2014-03-05 NOTE — Progress Notes (Signed)
[facility] PROGRESS NOTE  [name] MLY:[id number] DOB: [birth year] DOA: 02/18/2014 PCP: No primary care provider on file.   Brief narrative:    59 y.o. 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 [name], also found to have acute and subacute punctate cortical and subcortical stroke and was on aspirin. He presented to [facility] ED 02/18/2014 with bright red blood per rectum. GI was consulted and patient underwent flexible sigmoidoscopy with friable areas of the tumor obliterated and cauterized utilizing the argon plasma coagulator. Hospital stay has been complicated in that patient was found to be bacteremic. ID subsequently consulted.  Assessment/Plan:  Bacteremia - Growing streptococcus agalactiae  - ID consulted and assisting with managment - Currently on Rocephin and cefazolin antibiotic lock therapy - Will d/c port a cath given recurrence of fevers.  Leukocytosis - most likely due to bacteremia, trending down  Principal Problem:    BRBPR (bright red blood per rectum) - HGB steady near or at 9 - given steady hgb will d/c cardiac monitoring - No more bleeding reported  Active Problems:   Cerebral embolism with cerebral infarction - stable no new neurological deficits reported. - aspirin on hold due to GI bleed.    Hypokalemia - resolved on last check.    Acute GI bleeding - GI on board and patient is s/p flexible sigmoidoscopy with friable areas of the tumor obliterated and cauterized utilizing the argon plasma coagulator - aspirin  - reassess hgb levels next am.    Decreased appetite - Currently on Boost and on bland diet.    Weakness generalized - PT on board.    Palliative care encounter - Currently on board and will  assist with planning  Code Status: full Family Communication: no family at bedside Disposition Plan: Pending improvement in condition and recommendations from specialist.   Consultants:  GI  Palliative  ID on board  Procedures:  As listed above  Antibiotics:  Rocephin  HPI/Subjective: Pt has no new complaints. No acute issues overnight.  Objective: Filed Vitals:   03/05/14 1510  BP: 106/78  Pulse: 100  Temp: 99.9 F (37.7 C)  Resp: 19    Intake/Output Summary (Last 24 hours) at 03/05/14 1540 Last data filed at 03/05/14 1259  Gross per 24 hour  Intake    620 ml  Output   2975 ml  Net  -2355 ml   Filed Weights   03/04/14 0511 03/05/14 0235 03/05/14 1510  Weight: 71.578 kg (157 lb 12.8 oz) 72.5 kg (159 lb 13.3 oz) 72.122 kg (159 lb)    Exam:   General:  Pt in NAD, alert and awake  Cardiovascular: rrr, no murmurs  Respiratory: cta bl, no wheezes  Abdomen: + bowel sounds, soft, NT  Musculoskeletal: no cyanosis or clubbing   Data Reviewed: Basic Metabolic Panel: No results for input(s): NA, K, CL, CO2, GLUCOSE, BUN, CREATININE, CALCIUM, MG, PHOS in the last 168 hours. Liver Function Tests: No results for input(s): AST, ALT, ALKPHOS, BILITOT, PROT, ALBUMIN in the last 168 hours. No results for input(s): LIPASE, AMYLASE in the last 168 hours. No results for input(s): AMMONIA in the last 168 hours. CBC:  Recent Labs Lab 02/28/14 0835 03/02/14 0530  WBC 11.4* 11.5*  HGB 9.5* 9.3*  HCT 30.2* 29.5*  MCV 93.2 92.8  PLT 313 350   Cardiac Enzymes: No results for input(s): CKTOTAL, CKMB, CKMBINDEX, TROPONINI in the last 168 hours. BNP (last 3 results) No results for input(s): PROBNP in the last 8760 hours. CBG: No results for input(s): GLUCAP in the last 168 hours.  Recent Results (from the past 240 hour(s))  Culture, blood (routine x 2)     Status: None   Collection Time: 02/25/14  5:50 PM  Result Value Ref Range Status   Specimen  Description BLOOD RIGHT HAND  Final   Special Requests BOTTLES DRAWN AEROBIC ONLY [place]  Final   Culture   Final    NO GROWTH 5 DAYS Performed at [facility]    Report Status 03/03/2014 FINAL  Final  Culture, blood (routine x 2)     Status: None   Collection Time: 02/25/14  6:00 PM  Result Value Ref Range Status   Specimen Description BLOOD RIGHT ARM  Final   Special Requests BOTTLES DRAWN AEROBIC ONLY [facility]CC  Final   Culture   Final    NO GROWTH 5 DAYS Performed at [facility]    Report Status 03/03/2014 FINAL  Final     Studies: No results found.  Scheduled Meds: . cefazolin 5 mg/ml + heparin 2500 units/ml  2.5 mL Intracatheter Q24H  . cefTRIAXone (ROCEPHIN)  IV  2 g Intravenous Q24H  . dronabinol  5 mg Oral TID AC  . feeding supplement (ENSURE COMPLETE)  237 mL Oral TID BM  . folic acid  1 mg Oral Daily  . pantoprazole  40 mg Oral BID  . pravastatin  20 mg Oral q1800  . saccharomyces boulardii  250 mg Oral BID  . sodium chloride  3 mL Intravenous Q12H  . traZODone  75 mg Oral QHS   Continuous Infusions:     Time spent: > 35 minutes    Velvet Bathe  [facility] Pager [phone] If 7PM-7AM, please contact night-coverage at [email or web address], password [facility] 03/05/2014, 3:40 PM  LOS: 15 days

## 2014-03-06 DIAGNOSIS — C189 Malignant neoplasm of colon, unspecified: Secondary | ICD-10-CM

## 2014-03-06 DIAGNOSIS — B955 Unspecified streptococcus as the cause of diseases classified elsewhere: Secondary | ICD-10-CM

## 2014-03-06 DIAGNOSIS — C787 Secondary malignant neoplasm of liver and intrahepatic bile duct: Secondary | ICD-10-CM | POA: Insufficient documentation

## 2014-03-06 DIAGNOSIS — C801 Malignant (primary) neoplasm, unspecified: Secondary | ICD-10-CM

## 2014-03-06 LAB — CBC
HCT: 29.4 % — ABNORMAL LOW (ref 39.0–52.0)
Hemoglobin: 9.2 g/dL — ABNORMAL LOW (ref 13.0–17.0)
MCH: 29.2 pg (ref 26.0–34.0)
MCHC: 31.3 g/dL (ref 30.0–36.0)
MCV: 93.3 fL (ref 78.0–100.0)
PLATELETS: 427 10*3/uL — AB (ref 150–400)
RBC: 3.15 MIL/uL — ABNORMAL LOW (ref 4.22–5.81)
RDW: 19.2 % — ABNORMAL HIGH (ref 11.5–15.5)
WBC: 15.4 10*3/uL — ABNORMAL HIGH (ref 4.0–10.5)

## 2014-03-06 LAB — MRSA PCR SCREENING: MRSA by PCR: NEGATIVE

## 2014-03-06 NOTE — Progress Notes (Signed)
Physical Therapy Treatment Patient Details Name: [name] MRN: [id number] DOB: February 11, 1955 Today's Date: 03/06/2014    History of Present Illness [name] is a 59 y.o. male with Past medical history of colonic stent placement and a Port-A-Cath placement for metastatic colon cancer, ongoing with chemotherapy. Admitted with BRBPR.     PT Comments    Pt in bed feeling "tired", "weak".  Assisted OOB to amb in hallway.  Pt declined rec for use of a walker so hand held assist.  Very unsteady gait.  Max c/o weakness and "soreness from being in bed".  "Feels better to get up and walk".   Follow Up Recommendations  Home health PT (per chart, son from [place] to come up and take care of pt)     Equipment Recommendations  Cane    Recommendations for Other Services       Precautions / Restrictions Precautions Precautions: Fall Restrictions Weight Bearing Restrictions: No    Mobility  Bed Mobility Overal bed mobility: Modified Independent             General bed mobility comments: increased time  Transfers Overall transfer level: Needs assistance Equipment used: None Transfers: Sit to/from Stand Sit to Stand: Min assist         General transfer comment: unsteady and use of hands to increase self rise  Ambulation/Gait Ambulation/Gait assistance: Min assist;Mod assist Ambulation Distance (Feet): 165 Feet Assistive device: None Gait Pattern/deviations: Step-to pattern;Step-through pattern;Staggering left;Staggering right;Narrow base of support Gait velocity: decreased   General Gait Details: Intermittent assist needed to steady. Dyspnea 2/4. Brief standing rest breaks needed during walk. Unsteady/drunken gait but declined rec for a walker.   Stairs            Wheelchair Mobility    Modified Rankin (Stroke Patients Only)       Balance                                    Cognition Arousal/Alertness: Awake/alert Behavior During Therapy:  WFL for tasks assessed/performed Overall Cognitive Status: Within Functional Limits for tasks assessed                      Exercises      General Comments        Pertinent Vitals/Pain Pain Assessment: No/denies pain    Home Living                      Prior Function            PT Goals (current goals can now be found in the care plan section) Progress towards PT goals: Progressing toward goals    Frequency  Min 3X/week    PT Plan      Co-evaluation             End of Session Equipment Utilized During Treatment: Gait belt Activity Tolerance: Patient limited by fatigue;Treatment limited secondary to medical complications (Comment) (weakness/weight loss/decreased mobility) Patient left: in chair;with call bell/phone within reach     Time: 1126-1150 PT Time Calculation (min) (ACUTE ONLY): 24 min  Charges:  $Gait Training: 8-22 mins $Therapeutic Activity: 8-22 mins                    G Codes:      Rica Koyanagi  PTA [facility]  Acute  Rehab Pager  [phone]  

## 2014-03-06 NOTE — Consult Note (Signed)
Reason for Consult:   Bacteremia with request for removal of [place] cath. Referring Physician: Dr. Georgiann Mohs  [name] is an 59 y.o. male.  HPI: 7 ty/o with metastatic colon cancer who had a port a cath place by Dr. Rosendo Gros on 01/02/14.  He was admitted on 02/18/14 with blood per rectum, feeling light headed and weak.  He was Hypotensive in the ED.  He had several episodes of bright red blood from his rectum.  Work up in the ED showed an elevated WBC, H/H 7.7/22.5, platelets at 186K.  He was transfused with 2 units of PRBC'S.  Placed on IV Protonix and Asprin was held.  He has a leukocytosis and blood cultures were obtained.  He has a streptococcal bacteremia.  He is being followed by Dr. Megan Salon of ID.  He has recommended the porta cath be removed. He underwent Sigmoidoscopy with control of bleeding and Sigmoidoscopy with ablation therapy by Dr. Deatra Ina on 02/19/14.  H/H is stable.  We are ask to see for removal of porta cath.  He is currently on a soft diet.   Past Medical History  Diagnosis Date  Stage IV colon cancer with metastasis to the liver   S/p colonic stent and chemo therapy    Hx of CVA 01/08/2014   CAD/Hx of CABG/Mild MR    Hx of Aortic aneurysm repair at [facility]; he cannot remember where   Hypertension   Hx of Marijuana use       12/30/2013    Past Surgical History  Procedure Laterality Date  . Coronary artery bypass graft  He say he thinks it was done 2 years ago in [place]    . Flexible sigmoidoscopy N/A 12/30/2013    Procedure: FLEXIBLE SIGMOIDOSCOPY;  Surgeon: Lafayette Dragon, MD;  Location: [facility] ENDOSCOPY;  Service: Endoscopy;  Laterality: N/A;  . Flexible sigmoidoscopy N/A 01/01/2014    Procedure: FLEXIBLE SIGMOIDOSCOPY;  Surgeon: Inda Castle, MD;  Location: [place];  Service: Endoscopy;  Laterality: N/A;  with stent placement  . Colonic stent placement N/A 01/01/2014    Procedure: COLONIC STENT PLACEMENT;  Surgeon: Inda Castle, MD;  Location: [place];   Service: Endoscopy;  Laterality: N/A;  . Portacath placement N/A 01/02/2014    Procedure: INSERTION PORT-A-CATH;  Surgeon: Ralene Ok, MD;  Location: [place];  Service: General;  Laterality: N/A;  . Flexible sigmoidoscopy N/A 02/19/2014    Procedure: FLEXIBLE SIGMOIDOSCOPY;  Surgeon: Inda Castle, MD;  Location: [facility] ENDOSCOPY;  Service: Endoscopy;  Laterality: N/A;    History reviewed. No pertinent family history.  Social History:  reports that he has never smoked. He has never used smokeless tobacco. He reports that he drinks alcohol. He reports that he uses illicit drugs (Marijuana).  Allergies:  Allergies  Allergen Reactions  . Penicillins     childhood    Medications:  Prior to Admission:  Prescriptions prior to admission  Medication Sig Dispense Refill Last Dose  . amLODipine (NORVASC) 5 MG tablet Take 1 tablet (5 mg total) by mouth daily. 30 tablet 0 02/17/2014  . aspirin EC 325 MG EC tablet Take 1 tablet (325 mg total) by mouth daily. 30 tablet 0 02/17/2014  . bismuth subsalicylate (PEPTO BISMOL) 262 MG/15ML suspension Take 30 mLs by mouth 4 (four) times daily -  before meals and at bedtime. 360 mL 0 02/17/2014  . diphenoxylate-atropine (LOMOTIL) 2.5-0.025 MG per tablet Take 2 tablets by mouth 4 (four) times daily. 30 tablet 0 02/17/2014  .  dronabinol (MARINOL) 5 MG capsule Take 1 capsule (5 mg total) by mouth 3 (three) times daily before meals. 60 capsule 0 02/17/2014  . feeding supplement, RESOURCE BREEZE, (RESOURCE BREEZE) LIQD Take 1 Container by mouth 3 (three) times daily between meals. 237 mL 0 1/61/0960  . folic acid (FOLVITE) 1 MG tablet Take 1 tablet (1 mg total) by mouth daily. 30 tablet 0 02/17/2014  . hydrALAZINE (APRESOLINE) 25 MG tablet Take 1 tablet (25 mg total) by mouth every 8 (eight) hours. 90 tablet 0 02/17/2014  . isosorbide dinitrate (ISORDIL) 40 MG tablet Take 1 tablet (40 mg total) by mouth 2 (two) times daily. 60 tablet 0 02/17/2014  . lidocaine-prilocaine  (EMLA) cream Apply 1 application topically as needed. Apply to [facility] site 1 hour prior to appointment time. 30 g 12 02/15/2014  . metoprolol succinate (TOPROL-XL) 50 MG 24 hr tablet Take 1 tablet (50 mg total) by mouth daily. Take with or immediately following a meal. 60 tablet 0 02/17/2014 at unsure  . oxyCODONE (OXY IR/ROXICODONE) 5 MG immediate release tablet Take 1 tablet (5 mg total) by mouth every 4 (four) hours as needed for severe pain. 30 tablet 0 02/17/2014  . potassium chloride SA (K-DUR,KLOR-CON) 20 MEQ tablet Take 1 tablet (20 mEq total) by mouth 2 (two) times daily. 14 tablet 0 02/17/2014  . pravastatin (PRAVACHOL) 20 MG tablet Take 1 tablet (20 mg total) by mouth daily at 6 PM. 30 tablet 0 02/17/2014  . saccharomyces boulardii (FLORASTOR) 250 MG capsule Take 1 capsule (250 mg total) by mouth 2 (two) times daily. 60 capsule 0 02/17/2014  . traZODone (DESYREL) 50 MG tablet Take 1 tablet (50 mg total) by mouth at bedtime as needed for sleep. 30 tablet 0 02/17/2014   Scheduled: . cefazolin 5 mg/ml + heparin 2500 units/ml  2.5 mL Intracatheter Q24H  . cefTRIAXone (ROCEPHIN)  IV  2 g Intravenous Q24H  . dronabinol  5 mg Oral TID AC  . feeding supplement (ENSURE COMPLETE)  237 mL Oral TID BM  . folic acid  1 mg Oral Daily  . pantoprazole  40 mg Oral BID  . pravastatin  20 mg Oral q1800  . saccharomyces boulardii  250 mg Oral BID  . sodium chloride  3 mL Intravenous Q12H  . traZODone  75 mg Oral QHS   Continuous:  AVW:[id number] **OR** acetaminophen, camphor-menthol, diphenhydrAMINE, ondansetron **OR** ondansetron (ZOFRAN) IV, oxyCODONE, sodium chloride Anti-infectives    Start     Dose/Rate Route Frequency Ordered Stop   02/26/14 1630  cefazolin 5 mg/ml + heparin 2500 units/ml antibiotic lock solution 2.5 mL     2.5 mL Intracatheter Every 24 hours 02/26/14 1407     02/26/14 1600  cefTRIAXone (ROCEPHIN) 2 g in dextrose 5 % 50 mL IVPB - Premix     2 g100 mL/hr over 30 Minutes Intravenous  Every 24 hours 02/26/14 1419     02/25/14 2200  cefTRIAXone (ROCEPHIN) 2 g in dextrose 5 % 50 mL IVPB - Premix  Status:  Discontinued     2 g100 mL/hr over 30 Minutes Intravenous Every 24 hours 02/25/14 1702 02/26/14 1419   02/24/14 1600  ceFAZolin (ANCEF) IVPB 2 g/50 mL premix  Status:  Discontinued     2 g100 mL/hr over 30 Minutes Intravenous Every 8 hours 02/24/14 1426 02/25/14 1702   02/22/14 0600  vancomycin (VANCOCIN) IVPB 1000 mg/200 mL premix  Status:  Discontinued     1,000 mg200 mL/hr over 60 Minutes Intravenous  Every 12 hours 02/21/14 1348 02/24/14 1424   02/21/14 1500  vancomycin (VANCOCIN) 1,500 mg in sodium chloride 0.9 % 500 mL IVPB     1,500 mg250 mL/hr over 120 Minutes Intravenous  Once 02/21/14 1348 02/21/14 1623   02/20/14 1830  levofloxacin (LEVAQUIN) IVPB 750 mg  Status:  Discontinued     750 mg100 mL/hr over 90 Minutes Intravenous Daily-1800 02/20/14 1812 02/22/14 1055      Results for orders placed or performed during the hospital encounter of 02/18/14 (from the past 48 hour(s))  Culture, blood (routine x 2)     Status: None (Preliminary result)   Collection Time: 03/05/14  4:34 PM  Result Value Ref Range   Specimen Description BLOOD RIGHT ARM    Special Requests BOTTLES DRAWN AEROBIC ONLY 3CC    Culture             BLOOD CULTURE RECEIVED NO GROWTH TO DATE CULTURE WILL BE HELD FOR 5 DAYS BEFORE ISSUING A FINAL NEGATIVE REPORT Performed at [facility]    Report Status PENDING   Culture, blood (routine x 2)     Status: None (Preliminary result)   Collection Time: 03/05/14  5:13 PM  Result Value Ref Range   Specimen Description BLOOD LEFT ARM    Special Requests BOTTLES DRAWN AEROBIC AND ANAEROBIC 5CC    Culture             BLOOD CULTURE RECEIVED NO GROWTH TO DATE CULTURE WILL BE HELD FOR 5 DAYS BEFORE ISSUING A FINAL NEGATIVE REPORT Performed at [facility]    Report Status PENDING   CBC     Status: Abnormal   Collection Time: 03/06/14 12:34  AM  Result Value Ref Range   WBC 15.4 (H) 4.0 - 10.5 K/uL   RBC 3.15 (L) 4.22 - 5.81 MIL/uL   Hemoglobin 9.2 (L) 13.0 - 17.0 g/dL   HCT 29.4 (L) 39.0 - 52.0 %   MCV 93.3 78.0 - 100.0 fL   MCH 29.2 26.0 - 34.0 pg   MCHC 31.3 30.0 - 36.0 g/dL   RDW 19.2 (H) 11.5 - 15.5 %   Platelets 427 (H) 150 - 400 K/uL    No results found.  Review of Systems  Constitutional: Positive for weight loss.  Respiratory: Negative.   Cardiovascular: Negative.   Gastrointestinal: Positive for blood in stool.       Chronic abdominal distension  Psychiatric/Behavioral: Positive for depression. The patient is nervous/anxious.   All other systems reviewed and are negative.  Blood pressure 130/93, pulse 107, temperature 98.7 F (37.1 C), temperature source Oral, resp. rate 20, height 5\' 10"  (1.778 m), weight 70.67 kg (155 lb 12.8 oz), SpO2 96 %. Physical Exam  Constitutional: He is oriented to person, place, and time. He appears well-developed and well-nourished.  He feels febrile now.  HENT:  Head: Normocephalic and atraumatic.  Nose: Nose normal.  Eyes: Conjunctivae and EOM are normal. Right eye exhibits no discharge. Left eye exhibits no discharge. No scleral icterus.  Neck: Normal range of motion. Neck supple. No JVD present. No tracheal deviation present. No thyromegaly present.  Cardiovascular: Normal rate, regular rhythm and intact distal pulses.   Murmur (soft systolic mumur.) heard. Mediastinal incision  Left upper chest Port in place, and being used.  Respiratory: Effort normal and breath sounds normal. No respiratory distress. He has no wheezes. He has no rales. He exhibits no tenderness.  GI: Soft. He exhibits distension (He says it stays  distened and he thinks it is his cancer.). He exhibits no mass. There is tenderness (some, not very much). There is no rebound and no guarding.  Musculoskeletal: He exhibits no edema or tenderness.  Lymphadenopathy:    He has no cervical adenopathy.   Neurological: He is alert and oriented to person, place, and time.  Skin: Skin is warm and dry. No rash noted. No erythema.  Psychiatric: He has a normal mood and affect. His behavior is normal. Judgment and thought content normal.    Assessment/Plan: 1.  Streptococcal bacteremia 2.  Stage IV colon cancer with stent and ongoing chemotherapy 3.  Hx of CVA 01/2014 4.  CAD s/p CABG about 2 years ago in [place] 5.  Aortic repair of some kind he cannot remember when or where. 6.  Hypertension  Plan:  Schedule for porta cath removal tomorrow.    Aianna Fahs 03/06/2014, 11:10 AM

## 2014-03-06 NOTE — Progress Notes (Signed)
[facility] PROGRESS NOTE  [name] VZD:[id number] DOB: [birth year] DOA: 02/18/2014 PCP: No primary care provider on file.  Assessment/Plan: 1. Group B strep bacteremia- blood cultures growing Streptococcus agalactie, patient started on Rocephin and cefazolin. Infectious disease following, possible source of infection is Port-A-Cath as per ID. We will consult surgical service to remove the Port-A-Cath. Peripheral IV will be inserted before the removal of Port-A-Cath. 2. Rectal bleeding- improving after patient had flexible sigmoidoscopy with obliteration and cauterization of the tumor. Hemoglobin is 9.2, and has remained stable over the past 5 days. 3. 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 Aspirin is on hold due to GI bleed, will check with GI when it is okay to restart aspirin. 4. DVT prophylaxis- SCDs  Code Status: Full code Family Communication: No family at bedside Disposition Plan: *To be decided   Consultants:  Infectious disease  Surgical service  Palliative care  Gastroenterology  Procedures:  None  Antibiotics:  Rocephin 02/26/2014  Cefazolin 02/26/2014  HPI/Subjective: 59 year old male with a history of hypertension, dyslipidemia, CVA, aortic stenosis and repair of aortic aneurysm at [facility]. Patient was recently hospitalized during she was found to have a sigmoid mass suspicious for colon cancer with liver metastasis. Patient underwent chemotherapy with FOLFOX 01/04/2014 per Dr. [name]. Patient also found to have acute and subacute punctate cortical and subcortical stroke patient was on aspirin. Patient present to the hospital on  02/19/2014 with bright red blood per rectum, GI was consulted and patient underwent flexible sigmoidoscopy with friable areas of tumor obliterated and cauterized utilizing the argon plasma coagulator. Patient had blood cultures drawn on 02/20/2014 which grew group B strep, infectious disease was  consulted. And patient started on IV Rocephin. Patient continues to have intermittent fevers and infectious disease no recommends to remove the Port-A-Cath. Patient denies any complaints this morning. Objective: Filed Vitals:   03/06/14 0548  BP: 130/93  Pulse: 107  Temp: 98.7 F (37.1 C)  Resp: 20    Intake/Output Summary (Last 24 hours) at 03/06/14 1349 Last data filed at 03/06/14 1104  Gross per 24 hour  Intake   2280 ml  Output   2100 ml  Net    180 ml   Filed Weights   03/05/14 0235 03/05/14 1510 03/06/14 0548  Weight: 72.5 kg (159 lb 13.3 oz) 72.122 kg (159 lb) 70.67 kg (155 lb 12.8 oz)    Exam:  Physical Exam: Eyes: No icterus, extraocular muscles intact  Lungs: Normal respiratory effort, bilateral clear to auscultation, no crackles or wheezes.  Heart: Regular rate and rhythm, S1 and S2 normal, no murmurs, rubs auscultated Abdomen: BS normoactive,soft,nondistended,non-tender to palpation,no organomegaly Extremities: No pretibial edema, no erythema, no cyanosis, no clubbing Neuro : Alert and oriented to time, place and person, No focal deficits   Data Reviewed: Basic Metabolic Panel: No results for input(s): NA, K, CL, CO2, GLUCOSE, BUN, CREATININE, CALCIUM, MG, PHOS in the last 168 hours. Liver Function Tests: No results for input(s): AST, ALT, ALKPHOS, BILITOT, PROT, ALBUMIN in the last 168 hours. No results for input(s): LIPASE, AMYLASE in the last 168 hours. No results for input(s): AMMONIA in the last 168 hours. CBC:  Recent Labs Lab 02/28/14 0835 03/02/14 0530 03/06/14 0034  WBC 11.4* 11.5* 15.4*  HGB 9.5* 9.3* 9.2*  HCT 30.2* 29.5* 29.4*  MCV 93.2 92.8 93.3  PLT 313 350 427*   Cardiac Enzymes: No results for input(s): CKTOTAL, CKMB, CKMBINDEX, TROPONINI in the last 168 hours. BNP (last  3 results) No results for input(s): PROBNP in the last 8760 hours. CBG: No results for input(s): GLUCAP in the last 168 hours.  Recent Results (from the past  240 hour(s))  Culture, blood (routine x 2)     Status: None   Collection Time: 02/25/14  5:50 PM  Result Value Ref Range Status   Specimen Description BLOOD RIGHT HAND  Final   Special Requests BOTTLES DRAWN AEROBIC ONLY [place]  Final   Culture   Final    NO GROWTH 5 DAYS Performed at [facility]    Report Status 03/03/2014 FINAL  Final  Culture, blood (routine x 2)     Status: None   Collection Time: 02/25/14  6:00 PM  Result Value Ref Range Status   Specimen Description BLOOD RIGHT ARM  Final   Special Requests BOTTLES DRAWN AEROBIC ONLY 10CC  Final   Culture   Final    NO GROWTH 5 DAYS Performed at [facility]    Report Status 03/03/2014 FINAL  Final  Culture, blood (routine x 2)     Status: None (Preliminary result)   Collection Time: 03/05/14  4:34 PM  Result Value Ref Range Status   Specimen Description BLOOD RIGHT ARM  Final   Special Requests BOTTLES DRAWN AEROBIC ONLY 3CC  Final   Culture   Final           BLOOD CULTURE RECEIVED NO GROWTH TO DATE CULTURE WILL BE HELD FOR 5 DAYS BEFORE ISSUING A FINAL NEGATIVE REPORT Performed at [facility]    Report Status PENDING  Incomplete  Culture, blood (routine x 2)     Status: None (Preliminary result)   Collection Time: 03/05/14  5:13 PM  Result Value Ref Range Status   Specimen Description BLOOD LEFT ARM  Final   Special Requests BOTTLES DRAWN AEROBIC AND ANAEROBIC 5CC  Final   Culture   Final           BLOOD CULTURE RECEIVED NO GROWTH TO DATE CULTURE WILL BE HELD FOR 5 DAYS BEFORE ISSUING A FINAL NEGATIVE REPORT Performed at [facility]    Report Status PENDING  Incomplete     Studies: No results found.  Scheduled Meds: . cefazolin 5 mg/ml + heparin 2500 units/ml  2.5 mL Intracatheter Q24H  . cefTRIAXone (ROCEPHIN)  IV  2 g Intravenous Q24H  . dronabinol  5 mg Oral TID AC  . feeding supplement (ENSURE COMPLETE)  237 mL Oral TID BM  . folic acid  1 mg Oral Daily  . pantoprazole   40 mg Oral BID  . pravastatin  20 mg Oral q1800  . saccharomyces boulardii  250 mg Oral BID  . sodium chloride  3 mL Intravenous Q12H  . traZODone  75 mg Oral QHS   Continuous Infusions:   Principal Problem:   Streptococcal bacteremia Active Problems:   Fever    Time spent: 25 min   [facility] Pager [phone]. If 7PM-7AM, please contact night-coverage at [email or web address], password [facility] 03/06/2014, 1:49 PM  LOS: 16 days

## 2014-03-06 NOTE — Plan of Care (Signed)
Problem: Phase I Progression Outcomes Goal: OOB as tolerated unless otherwise ordered Outcome: Progressing oob with assist      

## 2014-03-06 NOTE — Progress Notes (Signed)
Mr. Lengacher tells me that he is planned for surgery tomorrow and we are hopeful his infection/fever will improve post-op. He tells me today that his son who lives in [place] is planning to come this weekend and staying with a plan to take Mr. Kauer back to [place] to live with him. Mr. Vanover is excited about this plan. I encouraged him to coordinate this plan with Dr. [name] if he does relocate to [place]. Emotional support provided. He is always "still here" and happy to be alive.   Vinie Sill, NP Palliative Medicine Team Pager # [phone] (M-F 8a-5p) Team Phone # [phone] (Nights/Weekends)

## 2014-03-06 NOTE — Progress Notes (Signed)
Patient ID: [name], male   DOB: 03/03/1955, 59 y.o.   MRN: [id number]         [facility]    Date of Admission:  02/18/2014   Total days of antibiotics 15          Principal Problem:   Streptococcal bacteremia Active Problems:   Fever   . cefazolin 5 mg/ml + heparin 2500 units/ml  2.5 mL Intracatheter Q24H  . cefTRIAXone (ROCEPHIN)  IV  2 g Intravenous Q24H  . dronabinol  5 mg Oral TID AC  . feeding supplement (ENSURE COMPLETE)  237 mL Oral TID BM  . folic acid  1 mg Oral Daily  . pantoprazole  40 mg Oral BID  . pravastatin  20 mg Oral q1800  . saccharomyces boulardii  250 mg Oral BID  . sodium chloride  3 mL Intravenous Q12H  . traZODone  75 mg Oral QHS    OBJECTIVE: Blood pressure 130/93, pulse 107, temperature 98.7 F (37.1 C), temperature source Oral, resp. rate 20, height 5\' 10"  (1.778 m), weight 155 lb 12.8 oz (70.67 kg), SpO2 96 %.   General: He is sleeping soundly  Lab Results Lab Results  Component Value Date   WBC 15.4* 03/06/2014   HGB 9.2* 03/06/2014   HCT 29.4* 03/06/2014   MCV 93.3 03/06/2014   PLT 427* 03/06/2014    Lab Results  Component Value Date   CREATININE 0.83 02/24/2014   BUN 16 02/24/2014   NA 133* 02/24/2014   K 4.8 02/24/2014   CL 96 02/24/2014   CO2 30 02/24/2014    Lab Results  Component Value Date   ALT 11 02/19/2014   AST 29 02/19/2014   ALKPHOS 141* 02/19/2014   BILITOT 0.8 02/19/2014     Microbiology: Recent Results (from the past 240 hour(s))  Culture, blood (routine x 2)     Status: None   Collection Time: 02/25/14  5:50 PM  Result Value Ref Range Status   Specimen Description BLOOD RIGHT HAND  Final   Special Requests BOTTLES DRAWN AEROBIC ONLY [place]  Final   Culture   Final    NO GROWTH 5 DAYS Performed at [facility]    Report Status 03/03/2014 FINAL  Final  Culture, blood (routine x 2)     Status: None   Collection Time: 02/25/14  6:00 PM  Result Value Ref Range Status     Specimen Description BLOOD RIGHT ARM  Final   Special Requests BOTTLES DRAWN AEROBIC ONLY 10CC  Final   Culture   Final    NO GROWTH 5 DAYS Performed at [facility]    Report Status 03/03/2014 FINAL  Final  Culture, blood (routine x 2)     Status: None (Preliminary result)   Collection Time: 03/05/14  4:34 PM  Result Value Ref Range Status   Specimen Description BLOOD RIGHT ARM  Final   Special Requests BOTTLES DRAWN AEROBIC ONLY 3CC  Final   Culture   Final           BLOOD CULTURE RECEIVED NO GROWTH TO DATE CULTURE WILL BE HELD FOR 5 DAYS BEFORE ISSUING A FINAL NEGATIVE REPORT Performed at [facility]    Report Status PENDING  Incomplete  Culture, blood (routine x 2)     Status: None (Preliminary result)   Collection Time: 03/05/14  5:13 PM  Result Value Ref Range Status   Specimen Description BLOOD LEFT ARM  Final  Special Requests BOTTLES DRAWN AEROBIC AND ANAEROBIC 5CC  Final   Culture   Final           BLOOD CULTURE RECEIVED NO GROWTH TO DATE CULTURE WILL BE HELD FOR 5 DAYS BEFORE ISSUING A FINAL NEGATIVE REPORT Performed at [facility]    Report Status PENDING  Incomplete    Assessment: He has not had any more documented fevers and past 24 hours but I'm still concerned about Port-A-Cath infection and would recommend removal since he has continued to have intermittent fevers despite greater than 2 weeks of antibiotic therapy for his group B streptococcal bacteremia.  Plan: 1. Continue IV ceftriaxone and cefazolin antibiotic lock therapy for now 2. Recommend Port-A-Cath removal  Michel Bickers, MD [facility] [place] [phone] pager   [phone] cell 03/06/2014, 10:15 AM

## 2014-03-06 NOTE — Plan of Care (Signed)
Problem: Phase II Progression Outcomes Goal: Vital signs remain stable Outcome: Progressing Afebrile last night

## 2014-03-07 ENCOUNTER — Inpatient Hospital Stay (HOSPITAL_COMMUNITY): Payer: Medicaid Other | Admitting: Anesthesiology

## 2014-03-07 ENCOUNTER — Encounter (HOSPITAL_COMMUNITY): Payer: Self-pay | Admitting: Anesthesiology

## 2014-03-07 ENCOUNTER — Encounter (HOSPITAL_COMMUNITY)
Admission: EM | Disposition: A | Discharge status: Home Health | Payer: Self-pay | Source: Home / Self Care | Attending: Family Medicine

## 2014-03-07 HISTORY — PX: PORT-A-CATH REMOVAL: SHX5289

## 2014-03-07 SURGERY — REMOVAL PORT-A-CATH
Anesthesia: General

## 2014-03-07 MED ORDER — FENTANYL CITRATE 0.05 MG/ML IJ SOLN
INTRAMUSCULAR | Status: DC | PRN
  Administered 2014-03-07: 100 ug via INTRAVENOUS
  Administered 2014-03-07 (×2): 50 ug via INTRAVENOUS

## 2014-03-07 MED ORDER — MIDAZOLAM HCL 5 MG/5ML IJ SOLN
INTRAMUSCULAR | Status: DC | PRN
  Administered 2014-03-07: 1 mg via INTRAVENOUS

## 2014-03-07 MED ORDER — CEFTRIAXONE SODIUM 2 G IJ SOLR
INTRAMUSCULAR | Status: AC
  Filled 2014-03-07: qty 2

## 2014-03-07 MED ORDER — PROPOFOL 10 MG/ML IV BOLUS
INTRAVENOUS | Status: DC | PRN
  Administered 2014-03-07: 180 mg via INTRAVENOUS

## 2014-03-07 MED ORDER — LIDOCAINE HCL (CARDIAC) 20 MG/ML IV SOLN
INTRAVENOUS | Status: DC | PRN
  Administered 2014-03-07: 50 mg via INTRAVENOUS

## 2014-03-07 MED ORDER — BUPIVACAINE-EPINEPHRINE (PF) 0.25% -1:200000 IJ SOLN
INTRAMUSCULAR | Status: AC
  Filled 2014-03-07: qty 30

## 2014-03-07 MED ORDER — MIDAZOLAM HCL 2 MG/2ML IJ SOLN
INTRAMUSCULAR | Status: AC
  Filled 2014-03-07: qty 2

## 2014-03-07 MED ORDER — PHENYLEPHRINE 40 MCG/ML (10ML) SYRINGE FOR IV PUSH (FOR BLOOD PRESSURE SUPPORT)
PREFILLED_SYRINGE | INTRAVENOUS | Status: AC
  Filled 2014-03-07: qty 10

## 2014-03-07 MED ORDER — 0.9 % SODIUM CHLORIDE (POUR BTL) OPTIME
TOPICAL | Status: DC | PRN
  Administered 2014-03-07: 1000 mL

## 2014-03-07 MED ORDER — GLYCOPYRROLATE 0.2 MG/ML IJ SOLN
INTRAMUSCULAR | Status: AC
  Filled 2014-03-07: qty 2

## 2014-03-07 MED ORDER — FENTANYL CITRATE 0.05 MG/ML IJ SOLN: 25.0000 ug | INTRAMUSCULAR | Status: DC | PRN

## 2014-03-07 MED ORDER — LIDOCAINE HCL 1 % IJ SOLN
INTRAMUSCULAR | Status: AC
  Filled 2014-03-07: qty 20

## 2014-03-07 MED ORDER — FENTANYL CITRATE 0.05 MG/ML IJ SOLN
INTRAMUSCULAR | Status: AC
  Filled 2014-03-07: qty 5

## 2014-03-07 MED ORDER — PROPOFOL 10 MG/ML IV BOLUS
INTRAVENOUS | Status: AC
  Filled 2014-03-07: qty 20

## 2014-03-07 MED ORDER — BUPIVACAINE-EPINEPHRINE 0.25% -1:200000 IJ SOLN
INTRAMUSCULAR | Status: DC | PRN
  Administered 2014-03-07: 12 mL

## 2014-03-07 MED ORDER — LIDOCAINE HCL (PF) 1 % IJ SOLN
INTRAMUSCULAR | Status: DC | PRN
  Administered 2014-03-07: 20 mL

## 2014-03-07 MED ORDER — LACTATED RINGERS IV SOLN
INTRAVENOUS | Status: DC | PRN
  Administered 2014-03-07: 10:00:00 via INTRAVENOUS

## 2014-03-07 MED ORDER — NEOSTIGMINE METHYLSULFATE 10 MG/10ML IV SOLN
INTRAVENOUS | Status: AC
  Filled 2014-03-07: qty 1

## 2014-03-07 MED ORDER — ONDANSETRON HCL 4 MG/2ML IJ SOLN
INTRAMUSCULAR | Status: AC
  Filled 2014-03-07: qty 2

## 2014-03-07 MED ORDER — PROMETHAZINE HCL 25 MG/ML IJ SOLN
6.2500 mg | INTRAMUSCULAR | Status: DC | PRN
Start: 2014-03-07 — End: 2014-03-10

## 2014-03-07 MED ORDER — ONDANSETRON HCL 4 MG/2ML IJ SOLN
INTRAMUSCULAR | Status: DC | PRN
Start: 2014-03-07 — End: 2014-03-07
  Administered 2014-03-07: 4 mg via INTRAVENOUS

## 2014-03-07 SURGICAL SUPPLY — 35 items
BENZOIN TINCTURE PRP APPL 2/3 (GAUZE/BANDAGES/DRESSINGS) IMPLANT
BLADE SURG 15 STRL LF DISP TIS (BLADE) ×1 IMPLANT
BLADE SURG 15 STRL SS (BLADE) ×1
CHLORAPREP W/TINT 10.5 ML (MISCELLANEOUS) ×2 IMPLANT
DECANTER SPIKE VIAL GLASS SM (MISCELLANEOUS) ×2 IMPLANT
DRAPE LAPAROTOMY TRNSV 102X78 (DRAPE) ×2 IMPLANT
ELECT COATED BLADE 2.86 ST (ELECTRODE) IMPLANT
ELECT REM PT RETURN 9FT ADLT (ELECTROSURGICAL) ×2
ELECTRODE REM PT RTRN 9FT ADLT (ELECTROSURGICAL) ×1 IMPLANT
GAUZE SPONGE 4X4 12PLY STRL (GAUZE/BANDAGES/DRESSINGS) IMPLANT
GAUZE SPONGE 4X4 16PLY XRAY LF (GAUZE/BANDAGES/DRESSINGS) IMPLANT
GLOVE BIO SURGEON STRL SZ 6 (GLOVE) ×2 IMPLANT
GLOVE BIO SURGEON STRL SZ 6.5 (GLOVE) IMPLANT
GLOVE BIOGEL PI IND STRL 6.5 (GLOVE) ×1 IMPLANT
GLOVE BIOGEL PI IND STRL 7.0 (GLOVE) ×1 IMPLANT
GLOVE BIOGEL PI INDICATOR 6.5 (GLOVE) ×1
GLOVE BIOGEL PI INDICATOR 7.0 (GLOVE) ×1
GLOVE INDICATOR 6.5 STRL GRN (GLOVE) ×4 IMPLANT
GOWN SPEC L4 XLG W/TWL (GOWN DISPOSABLE) IMPLANT
GOWN STRL REUS W/ TWL XL LVL3 (GOWN DISPOSABLE) ×2 IMPLANT
GOWN STRL REUS W/TWL 2XL LVL3 (GOWN DISPOSABLE) ×2 IMPLANT
GOWN STRL REUS W/TWL XL LVL3 (GOWN DISPOSABLE) ×2
KIT BASIN OR (CUSTOM PROCEDURE TRAY) ×2 IMPLANT
LIQUID BAND (GAUZE/BANDAGES/DRESSINGS) ×2 IMPLANT
MARKER SKIN DUAL TIP RULER LAB (MISCELLANEOUS) ×2 IMPLANT
NEEDLE HYPO 22GX1.5 SAFETY (NEEDLE) IMPLANT
NEEDLE HYPO 25X1 1.5 SAFETY (NEEDLE) ×2 IMPLANT
PACK BASIC VI WITH GOWN DISP (CUSTOM PROCEDURE TRAY) ×2 IMPLANT
PENCIL BUTTON HOLSTER BLD 10FT (ELECTRODE) ×2 IMPLANT
STRIP CLOSURE SKIN 1/2X4 (GAUZE/BANDAGES/DRESSINGS) IMPLANT
SUT MNCRL AB 4-0 PS2 18 (SUTURE) ×2 IMPLANT
SUT VIC AB 3-0 SH 27 (SUTURE)
SUT VIC AB 3-0 SH 27X BRD (SUTURE) IMPLANT
SYR CONTROL 10ML LL (SYRINGE) ×2 IMPLANT
TOWEL OR 17X26 10 PK STRL BLUE (TOWEL DISPOSABLE) ×2 IMPLANT

## 2014-03-07 NOTE — Anesthesia Postprocedure Evaluation (Signed)
  Anesthesia Post-op Note  Patient: [name]  Procedure(s) Performed: Procedure(s): REMOVAL PORT-A-CATH (N/A)  Patient Location: PACU  Anesthesia Type:General  Level of Consciousness: awake and alert   Airway and Oxygen Therapy: Patient Spontanous Breathing  Post-op Pain: none  Post-op Assessment: Post-op Vital signs reviewed  Post-op Vital Signs: Reviewed  Last Vitals:  Filed Vitals:   03/07/14 1215  BP: 119/90  Pulse: 94  Temp: 36.6 C  Resp: 4    Complications: No apparent anesthesia complications

## 2014-03-07 NOTE — H&P (View-Only) (Signed)
Reason for Consult:   Bacteremia with request for removal of [place] cath. Referring Physician: Dr. Georgiann Mohs  [name] is an 59 y.o. male.  HPI: 71 ty/o with metastatic colon cancer who had a port a cath place by Dr. Rosendo Gros on 01/02/14.  He was admitted on 02/18/14 with blood per rectum, feeling light headed and weak.  He was Hypotensive in the ED.  He had several episodes of bright red blood from his rectum.  Work up in the ED showed an elevated WBC, H/H 7.7/22.5, platelets at 186K.  He was transfused with 2 units of PRBC'S.  Placed on IV Protonix and Asprin was held.  He has a leukocytosis and blood cultures were obtained.  He has a streptococcal bacteremia.  He is being followed by Dr. Megan Salon of ID.  He has recommended the porta cath be removed. He underwent Sigmoidoscopy with control of bleeding and Sigmoidoscopy with ablation therapy by Dr. Deatra Ina on 02/19/14.  H/H is stable.  We are ask to see for removal of porta cath.  He is currently on a soft diet.   Past Medical History  Diagnosis Date  Stage IV colon cancer with metastasis to the liver   S/p colonic stent and chemo therapy    Hx of CVA 01/08/2014   CAD/Hx of CABG/Mild MR    Hx of Aortic aneurysm repair at [facility]; he cannot remember where   Hypertension   Hx of Marijuana use       12/30/2013    Past Surgical History  Procedure Laterality Date  . Coronary artery bypass graft  He say he thinks it was done 2 years ago in [place]    . Flexible sigmoidoscopy N/A 12/30/2013    Procedure: FLEXIBLE SIGMOIDOSCOPY;  Surgeon: Lafayette Dragon, MD;  Location: [facility] ENDOSCOPY;  Service: Endoscopy;  Laterality: N/A;  . Flexible sigmoidoscopy N/A 01/01/2014    Procedure: FLEXIBLE SIGMOIDOSCOPY;  Surgeon: Inda Castle, MD;  Location: [place];  Service: Endoscopy;  Laterality: N/A;  with stent placement  . Colonic stent placement N/A 01/01/2014    Procedure: COLONIC STENT PLACEMENT;  Surgeon: Inda Castle, MD;  Location: [place];   Service: Endoscopy;  Laterality: N/A;  . Portacath placement N/A 01/02/2014    Procedure: INSERTION PORT-A-CATH;  Surgeon: Ralene Ok, MD;  Location: [place];  Service: General;  Laterality: N/A;  . Flexible sigmoidoscopy N/A 02/19/2014    Procedure: FLEXIBLE SIGMOIDOSCOPY;  Surgeon: Inda Castle, MD;  Location: [facility] ENDOSCOPY;  Service: Endoscopy;  Laterality: N/A;    History reviewed. No pertinent family history.  Social History:  reports that he has never smoked. He has never used smokeless tobacco. He reports that he drinks alcohol. He reports that he uses illicit drugs (Marijuana).  Allergies:  Allergies  Allergen Reactions  . Penicillins     childhood    Medications:  Prior to Admission:  Prescriptions prior to admission  Medication Sig Dispense Refill Last Dose  . amLODipine (NORVASC) 5 MG tablet Take 1 tablet (5 mg total) by mouth daily. 30 tablet 0 02/17/2014  . aspirin EC 325 MG EC tablet Take 1 tablet (325 mg total) by mouth daily. 30 tablet 0 02/17/2014  . bismuth subsalicylate (PEPTO BISMOL) 262 MG/15ML suspension Take 30 mLs by mouth 4 (four) times daily -  before meals and at bedtime. 360 mL 0 02/17/2014  . diphenoxylate-atropine (LOMOTIL) 2.5-0.025 MG per tablet Take 2 tablets by mouth 4 (four) times daily. 30 tablet 0 02/17/2014  .  dronabinol (MARINOL) 5 MG capsule Take 1 capsule (5 mg total) by mouth 3 (three) times daily before meals. 60 capsule 0 02/17/2014  . feeding supplement, RESOURCE BREEZE, (RESOURCE BREEZE) LIQD Take 1 Container by mouth 3 (three) times daily between meals. 237 mL 0 8/67/6720  . folic acid (FOLVITE) 1 MG tablet Take 1 tablet (1 mg total) by mouth daily. 30 tablet 0 02/17/2014  . hydrALAZINE (APRESOLINE) 25 MG tablet Take 1 tablet (25 mg total) by mouth every 8 (eight) hours. 90 tablet 0 02/17/2014  . isosorbide dinitrate (ISORDIL) 40 MG tablet Take 1 tablet (40 mg total) by mouth 2 (two) times daily. 60 tablet 0 02/17/2014  . lidocaine-prilocaine  (EMLA) cream Apply 1 application topically as needed. Apply to [facility] site 1 hour prior to appointment time. 30 g 12 02/15/2014  . metoprolol succinate (TOPROL-XL) 50 MG 24 hr tablet Take 1 tablet (50 mg total) by mouth daily. Take with or immediately following a meal. 60 tablet 0 02/17/2014 at unsure  . oxyCODONE (OXY IR/ROXICODONE) 5 MG immediate release tablet Take 1 tablet (5 mg total) by mouth every 4 (four) hours as needed for severe pain. 30 tablet 0 02/17/2014  . potassium chloride SA (K-DUR,KLOR-CON) 20 MEQ tablet Take 1 tablet (20 mEq total) by mouth 2 (two) times daily. 14 tablet 0 02/17/2014  . pravastatin (PRAVACHOL) 20 MG tablet Take 1 tablet (20 mg total) by mouth daily at 6 PM. 30 tablet 0 02/17/2014  . saccharomyces boulardii (FLORASTOR) 250 MG capsule Take 1 capsule (250 mg total) by mouth 2 (two) times daily. 60 capsule 0 02/17/2014  . traZODone (DESYREL) 50 MG tablet Take 1 tablet (50 mg total) by mouth at bedtime as needed for sleep. 30 tablet 0 02/17/2014   Scheduled: . cefazolin 5 mg/ml + heparin 2500 units/ml  2.5 mL Intracatheter Q24H  . cefTRIAXone (ROCEPHIN)  IV  2 g Intravenous Q24H  . dronabinol  5 mg Oral TID AC  . feeding supplement (ENSURE COMPLETE)  237 mL Oral TID BM  . folic acid  1 mg Oral Daily  . pantoprazole  40 mg Oral BID  . pravastatin  20 mg Oral q1800  . saccharomyces boulardii  250 mg Oral BID  . sodium chloride  3 mL Intravenous Q12H  . traZODone  75 mg Oral QHS   Continuous:  NOB:[id number] **OR** acetaminophen, camphor-menthol, diphenhydrAMINE, ondansetron **OR** ondansetron (ZOFRAN) IV, oxyCODONE, sodium chloride Anti-infectives    Start     Dose/Rate Route Frequency Ordered Stop   02/26/14 1630  cefazolin 5 mg/ml + heparin 2500 units/ml antibiotic lock solution 2.5 mL     2.5 mL Intracatheter Every 24 hours 02/26/14 1407     02/26/14 1600  cefTRIAXone (ROCEPHIN) 2 g in dextrose 5 % 50 mL IVPB - Premix     2 g100 mL/hr over 30 Minutes Intravenous  Every 24 hours 02/26/14 1419     02/25/14 2200  cefTRIAXone (ROCEPHIN) 2 g in dextrose 5 % 50 mL IVPB - Premix  Status:  Discontinued     2 g100 mL/hr over 30 Minutes Intravenous Every 24 hours 02/25/14 1702 02/26/14 1419   02/24/14 1600  ceFAZolin (ANCEF) IVPB 2 g/50 mL premix  Status:  Discontinued     2 g100 mL/hr over 30 Minutes Intravenous Every 8 hours 02/24/14 1426 02/25/14 1702   02/22/14 0600  vancomycin (VANCOCIN) IVPB 1000 mg/200 mL premix  Status:  Discontinued     1,000 mg200 mL/hr over 60 Minutes Intravenous  Every 12 hours 02/21/14 1348 02/24/14 1424   02/21/14 1500  vancomycin (VANCOCIN) 1,500 mg in sodium chloride 0.9 % 500 mL IVPB     1,500 mg250 mL/hr over 120 Minutes Intravenous  Once 02/21/14 1348 02/21/14 1623   02/20/14 1830  levofloxacin (LEVAQUIN) IVPB 750 mg  Status:  Discontinued     750 mg100 mL/hr over 90 Minutes Intravenous Daily-1800 02/20/14 1812 02/22/14 1055      Results for orders placed or performed during the hospital encounter of 02/18/14 (from the past 48 hour(s))  Culture, blood (routine x 2)     Status: None (Preliminary result)   Collection Time: 03/05/14  4:34 PM  Result Value Ref Range   Specimen Description BLOOD RIGHT ARM    Special Requests BOTTLES DRAWN AEROBIC ONLY 3CC    Culture             BLOOD CULTURE RECEIVED NO GROWTH TO DATE CULTURE WILL BE HELD FOR 5 DAYS BEFORE ISSUING A FINAL NEGATIVE REPORT Performed at [facility]    Report Status PENDING   Culture, blood (routine x 2)     Status: None (Preliminary result)   Collection Time: 03/05/14  5:13 PM  Result Value Ref Range   Specimen Description BLOOD LEFT ARM    Special Requests BOTTLES DRAWN AEROBIC AND ANAEROBIC 5CC    Culture             BLOOD CULTURE RECEIVED NO GROWTH TO DATE CULTURE WILL BE HELD FOR 5 DAYS BEFORE ISSUING A FINAL NEGATIVE REPORT Performed at [facility]    Report Status PENDING   CBC     Status: Abnormal   Collection Time: 03/06/14 12:34  AM  Result Value Ref Range   WBC 15.4 (H) 4.0 - 10.5 K/uL   RBC 3.15 (L) 4.22 - 5.81 MIL/uL   Hemoglobin 9.2 (L) 13.0 - 17.0 g/dL   HCT 29.4 (L) 39.0 - 52.0 %   MCV 93.3 78.0 - 100.0 fL   MCH 29.2 26.0 - 34.0 pg   MCHC 31.3 30.0 - 36.0 g/dL   RDW 19.2 (H) 11.5 - 15.5 %   Platelets 427 (H) 150 - 400 K/uL    No results found.  Review of Systems  Constitutional: Positive for weight loss.  Respiratory: Negative.   Cardiovascular: Negative.   Gastrointestinal: Positive for blood in stool.       Chronic abdominal distension  Psychiatric/Behavioral: Positive for depression. The patient is nervous/anxious.   All other systems reviewed and are negative.  Blood pressure 130/93, pulse 107, temperature 98.7 F (37.1 C), temperature source Oral, resp. rate 20, height 5\' 10"  (1.778 m), weight 70.67 kg (155 lb 12.8 oz), SpO2 96 %. Physical Exam  Constitutional: He is oriented to person, place, and time. He appears well-developed and well-nourished.  He feels febrile now.  HENT:  Head: Normocephalic and atraumatic.  Nose: Nose normal.  Eyes: Conjunctivae and EOM are normal. Right eye exhibits no discharge. Left eye exhibits no discharge. No scleral icterus.  Neck: Normal range of motion. Neck supple. No JVD present. No tracheal deviation present. No thyromegaly present.  Cardiovascular: Normal rate, regular rhythm and intact distal pulses.   Murmur (soft systolic mumur.) heard. Mediastinal incision  Left upper chest Port in place, and being used.  Respiratory: Effort normal and breath sounds normal. No respiratory distress. He has no wheezes. He has no rales. He exhibits no tenderness.  GI: Soft. He exhibits distension (He says it stays  distened and he thinks it is his cancer.). He exhibits no mass. There is tenderness (some, not very much). There is no rebound and no guarding.  Musculoskeletal: He exhibits no edema or tenderness.  Lymphadenopathy:    He has no cervical adenopathy.   Neurological: He is alert and oriented to person, place, and time.  Skin: Skin is warm and dry. No rash noted. No erythema.  Psychiatric: He has a normal mood and affect. His behavior is normal. Judgment and thought content normal.    Assessment/Plan: 1.  Streptococcal bacteremia 2.  Stage IV colon cancer with stent and ongoing chemotherapy 3.  Hx of CVA 01/2014 4.  CAD s/p CABG about 2 years ago in [place] 5.  Aortic repair of some kind he cannot remember when or where. 6.  Hypertension  Plan:  Schedule for porta cath removal tomorrow.    Kyle Stansell 03/06/2014, 11:10 AM

## 2014-03-07 NOTE — Interval H&P Note (Signed)
History and Physical Interval Note:  03/07/2014 10:04 AM  [name]  has presented today for surgery, with the diagnosis of Streptococcal Bacteremia  The various methods of treatment have been discussed with the patient and family. After consideration of risks, benefits and other options for treatment, the patient has consented to  Procedure(s): REMOVAL PORT-A-CATH (N/A) as a surgical intervention .  The patient's history has been reviewed, patient examined, no change in status, stable for surgery.  I have reviewed the patient's chart and labs.  Questions were answered to the patient's satisfaction.     Gerren Hoffmeier

## 2014-03-07 NOTE — Progress Notes (Signed)
Patient ID: [name], male   DOB: Oct 30, 1955, 59 y.o.   MRN: [id number]         [facility]    Date of Admission:  02/18/2014   Total days of antibiotics 16          Principal Problem:   Streptococcal bacteremia Active Problems:   Palliative care encounter   Fever   Liver metastases   Stage IV carcinoma of colon   . cefTRIAXone (ROCEPHIN)  IV  2 g Intravenous Q24H  . dronabinol  5 mg Oral TID AC  . feeding supplement (ENSURE COMPLETE)  237 mL Oral TID BM  . folic acid  1 mg Oral Daily  . pantoprazole  40 mg Oral BID  . pravastatin  20 mg Oral q1800  . saccharomyces boulardii  250 mg Oral BID  . sodium chloride  3 mL Intravenous Q12H  . traZODone  75 mg Oral QHS    Subjective: He has continued to be bothered by drenching sweats. He has noted intense itching on his legs since he returned from having his Port-A-Cath removed this morning.  Review of Systems: Pertinent items are noted in HPI.  Past Medical History  Diagnosis Date  . Hypertension   . Cerebral embolism   . Malignant neoplasm of colon 12/30/2013    History  Substance Use Topics  . Smoking status: Never Smoker   . Smokeless tobacco: Never Used  . Alcohol Use: Yes    History reviewed. No pertinent family history. Allergies  Allergen Reactions  . Penicillins     childhood    OBJECTIVE: Blood pressure 113/92, pulse 91, temperature 97.2 F (36.2 C), temperature source Oral, resp. rate 16, height 5\' 10"  (1.778 m), weight 152 lb (68.947 kg), SpO2 100 %. General: He is uncomfortable due to his itching but otherwise still in good spirits Skin: Dry skin on lower legs with excoriations Lungs: Clear Cor: No change in murmur Abdomen: Soft and nontender. No diarrhea Left anterior chest Port-A-Cath has been removed  Lab Results Lab Results  Component Value Date   WBC 15.4* 03/06/2014   HGB 9.2* 03/06/2014   HCT 29.4* 03/06/2014   MCV 93.3 03/06/2014   PLT 427* 03/06/2014      Lab Results  Component Value Date   CREATININE 0.83 02/24/2014   BUN 16 02/24/2014   NA 133* 02/24/2014   K 4.8 02/24/2014   CL 96 02/24/2014   CO2 30 02/24/2014    Lab Results  Component Value Date   ALT 11 02/19/2014   AST 29 02/19/2014   ALKPHOS 141* 02/19/2014   BILITOT 0.8 02/19/2014     Microbiology: Recent Results (from the past 240 hour(s))  Culture, blood (routine x 2)     Status: None   Collection Time: 02/25/14  5:50 PM  Result Value Ref Range Status   Specimen Description BLOOD RIGHT HAND  Final   Special Requests BOTTLES DRAWN AEROBIC ONLY [place]  Final   Culture   Final    NO GROWTH 5 DAYS Performed at [facility]    Report Status 03/03/2014 FINAL  Final  Culture, blood (routine x 2)     Status: None   Collection Time: 02/25/14  6:00 PM  Result Value Ref Range Status   Specimen Description BLOOD RIGHT ARM  Final   Special Requests BOTTLES DRAWN AEROBIC ONLY 10CC  Final   Culture   Final    NO GROWTH 5 DAYS Performed at [facility]    Report Status 03/03/2014 FINAL  Final  Culture, blood (routine x 2)     Status: None (Preliminary result)   Collection Time: 03/05/14  4:34 PM  Result Value Ref Range Status   Specimen Description BLOOD RIGHT ARM  Final   Special Requests BOTTLES DRAWN AEROBIC ONLY 3CC  Final   Culture   Final           BLOOD CULTURE RECEIVED NO GROWTH TO DATE CULTURE WILL BE HELD FOR 5 DAYS BEFORE ISSUING A FINAL NEGATIVE REPORT Performed at [facility]    Report Status PENDING  Incomplete  Culture, blood (routine x 2)     Status: None (Preliminary result)   Collection Time: 03/05/14  5:13 PM  Result Value Ref Range Status   Specimen Description BLOOD LEFT ARM  Final   Special Requests BOTTLES DRAWN AEROBIC AND ANAEROBIC 5CC  Final   Culture   Final           BLOOD CULTURE RECEIVED NO GROWTH TO DATE CULTURE WILL BE HELD FOR 5 DAYS BEFORE ISSUING A FINAL NEGATIVE REPORT Performed at [facility]     Report Status PENDING  Incomplete  MRSA PCR Screening     Status: None   Collection Time: 03/06/14  3:51 PM  Result Value Ref Range Status   MRSA by PCR NEGATIVE NEGATIVE Final    Comment:        The GeneXpert MRSA Assay (FDA approved for NASAL specimens only), is one component of a comprehensive MRSA colonization surveillance program. It is not intended to diagnose MRSA infection nor to guide or monitor treatment for MRSA infections.     Assessment: I'm hopeful that his sweats and fevers will resolve now that his Port-A-Cath has been removed. If he does not have any further fever and his most recent blood cultures remain negative I will consider stopping ceftriaxone soon.  Plan: 1. Continue ceftriaxone pending blood and Port-A-Cath cultures  Michel Bickers, MD [facility] [place] [phone] pager   [phone] cell 03/07/2014, 5:05 PM

## 2014-03-07 NOTE — Transfer of Care (Signed)
Immediate Anesthesia Transfer of Care Note  Patient: [name]  Procedure(s) Performed: Procedure(s): REMOVAL PORT-A-CATH (N/A)  Patient Location: PACU  Anesthesia Type:General  Level of Consciousness: awake, alert  and oriented  Airway & Oxygen Therapy: Patient Spontanous Breathing and Patient connected to face mask oxygen  Post-op Assessment: Report given to PACU RN  Post vital signs: Reviewed and stable  Last Vitals:  Filed Vitals:   03/07/14 0715  BP: 119/97  Pulse: 109  Temp: 37 C  Resp: 16    Complications: No apparent anesthesia complications

## 2014-03-07 NOTE — Progress Notes (Signed)
[facility] PROGRESS NOTE  [name] GUY:[id number] DOB: 11/13/1955 DOA: 02/18/2014 PCP: No primary care provider on file.  Assessment/Plan: 1. Group B strep bacteremia- blood cultures growing Streptococcus agalactie, patient started on Rocephin and cefazolin. Infectious disease following, possible source of infection is Port-A-Cath as per ID. We  consulted surgical service to remove the Port-A-Cath. Peripheral IV will be inserted before the removal of Port-A-Cath. Plan is for removal of port a cath today. 2. Rectal bleeding- improving after patient had flexible sigmoidoscopy with obliteration and cauterization of the tumor. Hemoglobin is 9.2, and has remained stable over the past 5 days. 3. 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 Aspirin is on hold due to GI bleed, will check with GI when it is okay to restart aspirin. 4. DVT prophylaxis- SCDs  Code Status: Full code Family Communication: No family at bedside Disposition Plan: *To be decided   Consultants:  Infectious disease  Surgical service  Palliative care  Gastroenterology  Procedures:  None  Antibiotics:  Rocephin 02/26/2014  Cefazolin 02/26/2014  HPI/Subjective: 59 year old male with a history of hypertension, dyslipidemia, CVA, aortic stenosis and repair of aortic aneurysm at [facility] Patient was recently hospitalized during she was found to have a sigmoid mass suspicious for colon cancer with liver metastasis. Patient underwent chemotherapy with FOLFOX 01/04/2014 per Dr. [name]. Patient also found to have acute and subacute punctate cortical and subcortical stroke patient was on aspirin. Patient present to the hospital on  02/19/2014 with bright red blood per rectum, GI was consulted and patient underwent flexible sigmoidoscopy with friable areas of tumor obliterated and cauterized utilizing the argon plasma coagulator. Patient had blood cultures drawn on 02/20/2014 which grew group B  strep, infectious disease was consulted. And patient started on IV Rocephin. Patient continues to have intermittent fevers and infectious disease no recommends to remove the Port-A-Cath. Patient denies any complaints this morning. Plan for removal of port a cath. Objective: Filed Vitals:   03/07/14 0715  BP: 119/97  Pulse: 109  Temp: 98.6 F (37 C)  Resp: 16    Intake/Output Summary (Last 24 hours) at 03/07/14 1104 Last data filed at 03/07/14 0900  Gross per 24 hour  Intake    480 ml  Output   2225 ml  Net  -1745 ml   Filed Weights   03/05/14 1510 03/06/14 0548 03/07/14 0715  Weight: 72.122 kg (159 lb) 70.67 kg (155 lb 12.8 oz) 68.947 kg (152 lb)    Exam:  Physical Exam: Eyes: No icterus, extraocular muscles intact  Lungs: Normal respiratory effort, bilateral clear to auscultation, no crackles or wheezes.  Heart: Regular rate and rhythm, S1 and S2 normal, positive 4/6 systolic murmur in the mitral area  Abdomen: BS normoactive,soft,nondistended,non-tender to palpation,no organomegaly Extremities: No pretibial edema, no erythema, no cyanosis, no clubbing Neuro : Alert and oriented to time, place and person, No focal deficits   Data Reviewed: Basic Metabolic Panel: No results for input(s): NA, K, CL, CO2, GLUCOSE, BUN, CREATININE, CALCIUM, MG, PHOS in the last 168 hours. Liver Function Tests: No results for input(s): AST, ALT, ALKPHOS, BILITOT, PROT, ALBUMIN in the last 168 hours. No results for input(s): LIPASE, AMYLASE in the last 168 hours. No results for input(s): AMMONIA in the last 168 hours. CBC:  Recent Labs Lab 03/02/14 0530 03/06/14 0034  WBC 11.5* 15.4*  HGB 9.3* 9.2*  HCT 29.5* 29.4*  MCV 92.8 93.3  PLT 350 427*   Cardiac Enzymes: No results for input(s):  CKTOTAL, CKMB, CKMBINDEX, TROPONINI in the last 168 hours. BNP (last 3 results) No results for input(s): PROBNP in the last 8760 hours. CBG: No results for input(s): GLUCAP in the last 168  hours.  Recent Results (from the past 240 hour(s))  Culture, blood (routine x 2)     Status: None   Collection Time: 02/25/14  5:50 PM  Result Value Ref Range Status   Specimen Description BLOOD RIGHT HAND  Final   Special Requests BOTTLES DRAWN AEROBIC ONLY [place]  Final   Culture   Final    NO GROWTH 5 DAYS Performed at [facility]    Report Status 03/03/2014 FINAL  Final  Culture, blood (routine x 2)     Status: None   Collection Time: 02/25/14  6:00 PM  Result Value Ref Range Status   Specimen Description BLOOD RIGHT ARM  Final   Special Requests BOTTLES DRAWN AEROBIC ONLY 10CC  Final   Culture   Final    NO GROWTH 5 DAYS Performed at [facility]    Report Status 03/03/2014 FINAL  Final  Culture, blood (routine x 2)     Status: None (Preliminary result)   Collection Time: 03/05/14  4:34 PM  Result Value Ref Range Status   Specimen Description BLOOD RIGHT ARM  Final   Special Requests BOTTLES DRAWN AEROBIC ONLY 3CC  Final   Culture   Final           BLOOD CULTURE RECEIVED NO GROWTH TO DATE CULTURE WILL BE HELD FOR 5 DAYS BEFORE ISSUING A FINAL NEGATIVE REPORT Performed at [facility]    Report Status PENDING  Incomplete  Culture, blood (routine x 2)     Status: None (Preliminary result)   Collection Time: 03/05/14  5:13 PM  Result Value Ref Range Status   Specimen Description BLOOD LEFT ARM  Final   Special Requests BOTTLES DRAWN AEROBIC AND ANAEROBIC 5CC  Final   Culture   Final           BLOOD CULTURE RECEIVED NO GROWTH TO DATE CULTURE WILL BE HELD FOR 5 DAYS BEFORE ISSUING A FINAL NEGATIVE REPORT Performed at [facility]    Report Status PENDING  Incomplete  MRSA PCR Screening     Status: None   Collection Time: 03/06/14  3:51 PM  Result Value Ref Range Status   MRSA by PCR NEGATIVE NEGATIVE Final    Comment:        The GeneXpert MRSA Assay (FDA approved for NASAL specimens only), is one component of a comprehensive MRSA  colonization surveillance program. It is not intended to diagnose MRSA infection nor to guide or monitor treatment for MRSA infections.      Studies: No results found.  Scheduled Meds: . cefazolin 5 mg/ml + heparin 2500 units/ml  2.5 mL Intracatheter Q24H  . cefTRIAXone (ROCEPHIN)  IV  2 g Intravenous Q24H  . dronabinol  5 mg Oral TID AC  . feeding supplement (ENSURE COMPLETE)  237 mL Oral TID BM  . folic acid  1 mg Oral Daily  . pantoprazole  40 mg Oral BID  . pravastatin  20 mg Oral q1800  . saccharomyces boulardii  250 mg Oral BID  . sodium chloride  3 mL Intravenous Q12H  . traZODone  75 mg Oral QHS   Continuous Infusions:   Principal Problem:   Streptococcal bacteremia Active Problems:   Palliative care encounter   Fever   Liver metastases  Stage IV carcinoma of colon    Time spent: 25 min   [facility] Pager [phone]. If 7PM-7AM, please contact night-coverage at [email or web address], password [facility] 03/07/2014, 11:04 AM  LOS: 17 days

## 2014-03-07 NOTE — Anesthesia Preprocedure Evaluation (Signed)
Anesthesia Evaluation  Patient identified by MRN, date of birth, ID band Patient awake    Reviewed: Allergy & Precautions, NPO status , Patient's Chart, lab work & pertinent test results, reviewed documented beta blocker date and time   Airway Mallampati: II  TM Distance: >3 FB Neck ROM: Full    Dental  (+) Teeth Intact, Poor Dentition   Pulmonary shortness of breath,          Cardiovascular hypertension, Pt. on medications + CABG and + Peripheral Vascular Disease     Neuro/Psych CVA    GI/Hepatic   Endo/Other    Renal/GU      Musculoskeletal   Abdominal   Peds  Hematology   Anesthesia Other Findings   Reproductive/Obstetrics                             Anesthesia Physical Anesthesia Plan  ASA: III  Anesthesia Plan: General   Post-op Pain Management:    Induction: Intravenous  Airway Management Planned: LMA and Oral ETT  Additional Equipment:   Intra-op Plan:   Post-operative Plan: Extubation in OR  Informed Consent: I have reviewed the patients History and Physical, chart, labs and discussed the procedure including the risks, benefits and alternatives for the proposed anesthesia with the patient or authorized representative who has indicated his/her understanding and acceptance.   Dental advisory given  Plan Discussed with: CRNA, Anesthesiologist and Surgeon  Anesthesia Plan Comments:         Anesthesia Quick Evaluation

## 2014-03-07 NOTE — Op Note (Signed)
  PRE-OPERATIVE DIAGNOSIS:  Infected port a cath  POST-OPERATIVE DIAGNOSIS:  Same   PROCEDURE:  Procedure(s):  REMOVAL PORT-A-CATH  SURGEON:  Surgeon(s):  Stark Klein, MD  ANESTHESIA:   General + local  EBL:   Minimal  SPECIMEN:  None  Complications : none known  Procedure:   Pt was  identified in the holding area and taken to the operating room where she was placed supine on the operating room table.  General anesthesia was induced via LMA.  The R upper chest was prepped and draped.  The prior incision was anesthetized with local anesthetic.  The incision was opened with a #15 blade.  The subcutaneous tissue was divided with the cautery.  The port was identified and the capsule opened.  Three 2-0 prolene sutures were removed.  The port was then removed and pressure held on the tract.  The catheter appeared intact without evidence of breakage.  The wound was inspected for hemostasis, which was achieved with cautery.  The wound was closed with 3-0 vicryl deep dermal interrupted sutures and 4-0 Monocryl running subcuticular suture.  The wound was cleaned, dried, and dressed with dermabond.  The patient was awakened from anesthesia and taken to the PACU in stable condition.  Needle, sponge, and instrument counts are correct.

## 2014-03-08 ENCOUNTER — Encounter (HOSPITAL_COMMUNITY): Payer: Self-pay | Admitting: General Surgery

## 2014-03-08 DIAGNOSIS — M25511 Pain in right shoulder: Secondary | ICD-10-CM

## 2014-03-08 LAB — CBC
HEMATOCRIT: 29.8 % — AB (ref 39.0–52.0)
HEMOGLOBIN: 9.2 g/dL — AB (ref 13.0–17.0)
MCH: 28.8 pg (ref 26.0–34.0)
MCHC: 30.9 g/dL (ref 30.0–36.0)
MCV: 93.4 fL (ref 78.0–100.0)
Platelets: 400 10*3/uL (ref 150–400)
RBC: 3.19 MIL/uL — AB (ref 4.22–5.81)
RDW: 18.8 % — ABNORMAL HIGH (ref 11.5–15.5)
WBC: 17.6 10*3/uL — AB (ref 4.0–10.5)

## 2014-03-08 LAB — BASIC METABOLIC PANEL
ANION GAP: 10 (ref 5–15)
BUN: 31 mg/dL — AB (ref 6–23)
CALCIUM: 8.6 mg/dL (ref 8.4–10.5)
CO2: 32 mmol/L (ref 19–32)
Chloride: 92 mmol/L — ABNORMAL LOW (ref 96–112)
Creatinine, Ser: 1.22 mg/dL (ref 0.50–1.35)
GFR calc Af Amer: 73 mL/min — ABNORMAL LOW (ref >90)
GFR calc non Af Amer: 63 mL/min — ABNORMAL LOW (ref >90)
Glucose, Bld: 104 mg/dL — ABNORMAL HIGH (ref 70–99)
POTASSIUM: 4.3 mmol/L (ref 3.5–5.1)
Sodium: 134 mmol/L — ABNORMAL LOW (ref 135–145)

## 2014-03-08 MED ORDER — LIDOCAINE 5 % EX PTCH
1.0000 | MEDICATED_PATCH | CUTANEOUS | Status: DC
  Administered 2014-03-08 – 2014-03-09 (×2): 1 via TRANSDERMAL
  Filled 2014-03-08 (×3): qty 1

## 2014-03-08 NOTE — Progress Notes (Signed)
Patient ID: [name], male   DOB: July 16, 1955, 59 y.o.   MRN: [id number]         [facility]    Date of Admission:  02/18/2014   Total days of antibiotics 17          Principal Problem:   Streptococcal bacteremia Active Problems:   Palliative care encounter   Fever   Liver metastases   Stage IV carcinoma of colon   . cefTRIAXone (ROCEPHIN)  IV  2 g Intravenous Q24H  . dronabinol  5 mg Oral TID AC  . feeding supplement (ENSURE COMPLETE)  237 mL Oral TID BM  . folic acid  1 mg Oral Daily  . pantoprazole  40 mg Oral BID  . pravastatin  20 mg Oral q1800  . saccharomyces boulardii  250 mg Oral BID  . sodium chloride  3 mL Intravenous Q12H  . traZODone  75 mg Oral QHS    Subjective: He has continued to be bothered by sweats. His itching has improved.  Review of Systems: Pertinent items are noted in HPI.  Past Medical History  Diagnosis Date  . Hypertension   . Cerebral embolism   . Malignant neoplasm of colon 12/30/2013    History  Substance Use Topics  . Smoking status: Never Smoker   . Smokeless tobacco: Never Used  . Alcohol Use: Yes    History reviewed. No pertinent family history. Allergies  Allergen Reactions  . Penicillins     childhood    OBJECTIVE: Blood pressure 122/98, pulse 97, temperature 98.7 F (37.1 C), temperature source Oral, resp. rate 18, height 5\' 10"  (1.778 m), weight 152 lb (68.947 kg), SpO2 97 %. General: He is uncomfortable and resting quietly Skin: Dry skin but no rash Lungs: Clear Cor: No change in murmur Abdomen: Soft and nontender. No diarrhea Left anterior chest Port-A-Cath has been removed  Lab Results Lab Results  Component Value Date   WBC 17.6* 03/08/2014   HGB 9.2* 03/08/2014   HCT 29.8* 03/08/2014   MCV 93.4 03/08/2014   PLT 400 03/08/2014    Lab Results  Component Value Date   CREATININE 1.22 03/08/2014   BUN 31* 03/08/2014   NA 134* 03/08/2014   K 4.3 03/08/2014   CL 92*  03/08/2014   CO2 32 03/08/2014    Lab Results  Component Value Date   ALT 11 02/19/2014   AST 29 02/19/2014   ALKPHOS 141* 02/19/2014   BILITOT 0.8 02/19/2014     Microbiology: Recent Results (from the past 240 hour(s))  Culture, blood (routine x 2)     Status: None (Preliminary result)   Collection Time: 03/05/14  4:34 PM  Result Value Ref Range Status   Specimen Description BLOOD RIGHT ARM  Final   Special Requests BOTTLES DRAWN AEROBIC ONLY 3CC  Final   Culture   Final           BLOOD CULTURE RECEIVED NO GROWTH TO DATE CULTURE WILL BE HELD FOR 5 DAYS BEFORE ISSUING A FINAL NEGATIVE REPORT Performed at [facility]    Report Status PENDING  Incomplete  Culture, blood (routine x 2)     Status: None (Preliminary result)   Collection Time: 03/05/14  5:13 PM  Result Value Ref Range Status   Specimen Description BLOOD LEFT ARM  Final   Special Requests BOTTLES DRAWN AEROBIC AND ANAEROBIC 5CC  Final   Culture   Final  BLOOD CULTURE RECEIVED NO GROWTH TO DATE CULTURE WILL BE HELD FOR 5 DAYS BEFORE ISSUING A FINAL NEGATIVE REPORT Performed at [facility]    Report Status PENDING  Incomplete  MRSA PCR Screening     Status: None   Collection Time: 03/06/14  3:51 PM  Result Value Ref Range Status   MRSA by PCR NEGATIVE NEGATIVE Final    Comment:        The GeneXpert MRSA Assay (FDA approved for NASAL specimens only), is one component of a comprehensive MRSA colonization surveillance program. It is not intended to diagnose MRSA infection nor to guide or monitor treatment for MRSA infections.   Anaerobic culture     Status: None (Preliminary result)   Collection Time: 03/07/14 11:18 AM  Result Value Ref Range Status   Specimen Description PORTA CATH PORT A CATH TUBING AND PORT  Final   Special Requests NONE  Final   Gram Stain   Final    RARE WBC PRESENT, PREDOMINANTLY PMN NO SQUAMOUS EPITHELIAL CELLS SEEN NO ORGANISMS SEEN Performed at [facility]    Culture   Final    NO ANAEROBES ISOLATED; CULTURE IN PROGRESS FOR 5 DAYS Performed at [facility]    Report Status PENDING  Incomplete  Cath Tip Culture     Status: None (Preliminary result)   Collection Time: 03/07/14 11:18 AM  Result Value Ref Range Status   Specimen Description PORTA CATH PORT A CATH TUBING AND PORT  Final   Special Requests NONE  Final   Culture   Final    NO GROWTH 1 DAY Performed at [facility]    Report Status PENDING  Incomplete    Assessment: He remains afebrile and the last 2 sets of blood cultures have been negative. Now that his Port-A-Cath has been removed I will stop ceftriaxone.  Plan: 1. Discontinue ceftriaxone 2. Please call my partner, Dr. Talbot Grumbling [phone]), for any infectious disease questions this weekend  Michel Bickers, MD [facility] [phone] pager   [phone] cell 03/08/2014, 1:11 PM

## 2014-03-08 NOTE — Progress Notes (Signed)
Progress Note from the Palliative Medicine Team at [facility]: [name] is lying in bed. He is glad to have his surgery over with. He says his eldest son is coming to visit from [place] this weekend and he is excited to see him. He is still complaining of sweats. He is hoping to recover after the surgery and be able to discharge soon. He is hoping he will be ready for discharge soon so he can return with his son to [place]. He is complaining of soreness in his right shoulder - I will order lidoderm patch.     Objective: Allergies  Allergen Reactions  . Penicillins     childhood   Scheduled Meds: . dronabinol  5 mg Oral TID AC  . feeding supplement (ENSURE COMPLETE)  237 mL Oral TID BM  . folic acid  1 mg Oral Daily  . lidocaine  1 patch Transdermal Q24H  . pantoprazole  40 mg Oral BID  . pravastatin  20 mg Oral q1800  . saccharomyces boulardii  250 mg Oral BID  . sodium chloride  3 mL Intravenous Q12H  . traZODone  75 mg Oral QHS   Continuous Infusions:  PRN Meds:.acetaminophen **OR** acetaminophen, camphor-menthol, diphenhydrAMINE, fentaNYL, ondansetron **OR** ondansetron (ZOFRAN) IV, oxyCODONE, promethazine, sodium chloride  BP 122/98 mmHg  Pulse 97  Temp(Src) 98.7 F (37.1 C) (Oral)  Resp 18  Ht 5\' 10"  (1.778 m)  Wt 68.947 kg (152 lb)  BMI 21.81 kg/m2  SpO2 97%   PPS: 40%   Intake/Output Summary (Last 24 hours) at 03/08/14 1358 Last data filed at 03/08/14 1042  Gross per 24 hour  Intake   1440 ml  Output   1150 ml  Net    290 ml      LBM: 40%  Physical Exam:  General: NAD, lying in bed HEENT: [place]/AT, no JVD, moist mucous membranes Chest: Shortness of breath appears improved, symmetric CVS: RRR Abdomen: Soft, NT, ND, +BS Ext: MAE, no edema, warm to touch Neuro: Awake, alert, oriented x 3    Labs: CBC    Component Value Date/Time   WBC 17.6* 03/08/2014 0425   WBC 12.1* 02/13/2014 0916   WBC 10.4* 01/22/2014 0848   RBC 3.19* 03/08/2014  0425   RBC 3.87* 02/13/2014 0916   RBC 4.04* 01/22/2014 0848   HGB 9.2* 03/08/2014 0425   HGB 11.4* 02/13/2014 0916   HGB 11.5* 01/22/2014 0848   HCT 29.8* 03/08/2014 0425   HCT 33.5* 02/13/2014 0916   HCT 34.6* 01/22/2014 0848   PLT 400 03/08/2014 0425   PLT 295 02/13/2014 0916   PLT 350 01/22/2014 0848   MCV 93.4 03/08/2014 0425   MCV 86.6 02/13/2014 0916   MCV 86 01/22/2014 0848   MCH 28.8 03/08/2014 0425   MCH 29.5 02/13/2014 0916   MCH 28.5 01/22/2014 0848   MCHC 30.9 03/08/2014 0425   MCHC 34.0 02/13/2014 0916   MCHC 33.2 01/22/2014 0848   RDW 18.8* 03/08/2014 0425   RDW 19.8* 02/13/2014 0916   RDW 19.8* 01/22/2014 0848   LYMPHSABS 2.7 02/19/2014 1345   LYMPHSABS 1.1 02/13/2014 0916   LYMPHSABS 1.7 01/22/2014 0848   MONOABS 1.0 02/19/2014 1345   MONOABS 3.1* 02/13/2014 0916   EOSABS 0.0 02/19/2014 1345   EOSABS 0.3 02/13/2014 0916   EOSABS 0.3 01/22/2014 0848   BASOSABS 0.0 02/19/2014 1345   BASOSABS 0.0 02/13/2014 0916   BASOSABS 0.0 01/22/2014 0848    BMET    Component  Value Date/Time   NA 134* 03/08/2014 0425   NA 136 02/13/2014 0917   NA 137 01/22/2014 0848   K 4.3 03/08/2014 0425   K 3.5 02/13/2014 0917   K 3.6 01/22/2014 0848   CL 92* 03/08/2014 0425   CL 96* 01/22/2014 0848   CO2 32 03/08/2014 0425   CO2 26 02/13/2014 0917   CO2 25 01/22/2014 0848   GLUCOSE 104* 03/08/2014 0425   GLUCOSE 90 02/13/2014 0917   GLUCOSE 144* 01/22/2014 0848   BUN 31* 03/08/2014 0425   BUN 17.3 02/13/2014 0917   BUN 14 01/22/2014 0848   CREATININE 1.22 03/08/2014 0425   CREATININE 1.0 02/13/2014 0917   CREATININE 1.3* 01/22/2014 0848   CALCIUM 8.6 03/08/2014 0425   CALCIUM 8.9 02/13/2014 0917   CALCIUM 8.8 01/22/2014 0848   GFRNONAA 63* 03/08/2014 0425   GFRAA 73* 03/08/2014 0425    CMP     Component Value Date/Time   NA 134* 03/08/2014 0425   NA 136 02/13/2014 0917   NA 137 01/22/2014 0848   K 4.3 03/08/2014 0425   K 3.5 02/13/2014 0917   K 3.6  01/22/2014 0848   CL 92* 03/08/2014 0425   CL 96* 01/22/2014 0848   CO2 32 03/08/2014 0425   CO2 26 02/13/2014 0917   CO2 25 01/22/2014 0848   GLUCOSE 104* 03/08/2014 0425   GLUCOSE 90 02/13/2014 0917   GLUCOSE 144* 01/22/2014 0848   BUN 31* 03/08/2014 0425   BUN 17.3 02/13/2014 0917   BUN 14 01/22/2014 0848   CREATININE 1.22 03/08/2014 0425   CREATININE 1.0 02/13/2014 0917   CREATININE 1.3* 01/22/2014 0848   CALCIUM 8.6 03/08/2014 0425   CALCIUM 8.9 02/13/2014 0917   CALCIUM 8.8 01/22/2014 0848   PROT 5.1* 02/19/2014 1345   PROT 7.3 02/13/2014 0917   PROT 7.5 01/22/2014 0848   ALBUMIN 2.2* 02/19/2014 1345   ALBUMIN 2.5* 02/13/2014 0917   AST 29 02/19/2014 1345   AST 37* 02/13/2014 0917   AST 64* 01/22/2014 0848   ALT 11 02/19/2014 1345   ALT 14 02/13/2014 0917   ALT 17 01/22/2014 0848   ALKPHOS 141* 02/19/2014 1345   ALKPHOS 306* 02/13/2014 0917   ALKPHOS 261* 01/22/2014 0848   BILITOT 0.8 02/19/2014 1345   BILITOT 0.24 02/13/2014 0917   BILITOT 0.50 01/22/2014 0848   GFRNONAA 63* 03/08/2014 0425   GFRAA 73* 03/08/2014 0425    Assessment and Plan: 1. Code Status: DNR 2. Symptom Control: 1. Right shoulder soreness: Lidoderm patch. 2. Decreased appetite: Continue Ensure supplements.  3. Weakness: Continue medical management. Encourage walking and sitting up in chair.  3. Psycho/Social: Emotional support provided to patient. 4. Disposition: To be determined.     Time In Time Out Total Time Spent with Patient Total Overall Time  1200 1220 68min 66min    Greater than 50%  of this time was spent counseling and coordinating care related to the above assessment and plan.  Vinie Sill, NP Palliative Medicine Team Pager # [phone] (M-F 8a-5p) Team Phone # [phone] (Nights/Weekends)

## 2014-03-08 NOTE — Progress Notes (Signed)
[facility] PROGRESS NOTE  [name] HWE:[id number] DOB: May 23, 1955 DOA: 02/18/2014 PCP: No primary care provider on file.  Assessment/Plan: 1. Group B strep bacteremia- blood cultures growing Streptococcus agalactie, patient started on Rocephin and cefazolin. Infectious disease following, possible source of infection is Port-A-Cath as per ID. We  consulted surgical service to remove the Port-A-Cath, which was removed yesterday. Continue with IV antibiotics. Duration as per ID recommendation. 2. Rectal bleeding- improving after patient had flexible sigmoidoscopy with obliteration and cauterization of the tumor. Hemoglobin is 9.2, and has remained stable over the past 5 days. 3. 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 Aspirin is on hold due to GI bleed, will check with GI when it is okay to restart aspirin. 4. DVT prophylaxis- SCDs  Code Status: Full code Family Communication: No family at bedside Disposition Plan: *To be decided   Consultants:  Infectious disease  Surgical service  Palliative care  Gastroenterology  Procedures:  None  Antibiotics:  Rocephin 02/26/2014  Cefazolin 02/26/2014  HPI/Subjective: 59 year old male with a history of hypertension, dyslipidemia, CVA, aortic stenosis and repair of aortic aneurysm at [facility]. Patient was recently hospitalized during she was found to have a sigmoid mass suspicious for colon cancer with liver metastasis. Patient underwent chemotherapy with FOLFOX 01/04/2014 per Dr. [name]. Patient also found to have acute and subacute punctate cortical and subcortical stroke patient was on aspirin. Patient present to the hospital on  02/19/2014 with bright red blood per rectum, GI was consulted and patient underwent flexible sigmoidoscopy with friable areas of tumor obliterated and cauterized utilizing the argon plasma coagulator. Patient had blood cultures drawn on 02/20/2014 which grew group B strep,  infectious disease was consulted. And patient started on IV Rocephin. Patient continues to have intermittent fevers and infectious disease no recommends to remove the Port-A-Cath. Patient denies any pain this morning, still getting drenching sweats. Port a cath was removed yesterday.  Objective: Filed Vitals:   03/08/14 0515  BP: 122/98  Pulse: 97  Temp: 98.7 F (37.1 C)  Resp: 18    Intake/Output Summary (Last 24 hours) at 03/08/14 1000 Last data filed at 03/08/14 0940  Gross per 24 hour  Intake   1700 ml  Output   1155 ml  Net    545 ml   Filed Weights   03/05/14 1510 03/06/14 0548 03/07/14 0715  Weight: 72.122 kg (159 lb) 70.67 kg (155 lb 12.8 oz) 68.947 kg (152 lb)    Exam:  Physical Exam: Eyes: No icterus, extraocular muscles intact  Lungs: Normal respiratory effort, bilateral clear to auscultation, no crackles or wheezes.  Heart: Regular rate and rhythm, S1 and S2 normal, positive 4/6 systolic murmur in the mitral area  Abdomen: BS normoactive,soft,nondistended,non-tender to palpation,no organomegaly Extremities: No pretibial edema, no erythema, no cyanosis, no clubbing Neuro : Alert and oriented to time, place and person, No focal deficits   Data Reviewed: Basic Metabolic Panel:  Recent Labs Lab 03/08/14 0425  NA 134*  K 4.3  CL 92*  CO2 32  GLUCOSE 104*  BUN 31*  CREATININE 1.22  CALCIUM 8.6   Liver Function Tests: No results for input(s): AST, ALT, ALKPHOS, BILITOT, PROT, ALBUMIN in the last 168 hours. No results for input(s): LIPASE, AMYLASE in the last 168 hours. No results for input(s): AMMONIA in the last 168 hours. CBC:  Recent Labs Lab 03/02/14 0530 03/06/14 0034 03/08/14 0425  WBC 11.5* 15.4* 17.6*  HGB 9.3* 9.2* 9.2*  HCT 29.5* 29.4*  29.8*  MCV 92.8 93.3 93.4  PLT 350 427* 400   Cardiac Enzymes: No results for input(s): CKTOTAL, CKMB, CKMBINDEX, TROPONINI in the last 168 hours. BNP (last 3 results) No results for input(s): PROBNP  in the last 8760 hours. CBG: No results for input(s): GLUCAP in the last 168 hours.  Recent Results (from the past 240 hour(s))  Culture, blood (routine x 2)     Status: None (Preliminary result)   Collection Time: 03/05/14  4:34 PM  Result Value Ref Range Status   Specimen Description BLOOD RIGHT ARM  Final   Special Requests BOTTLES DRAWN AEROBIC ONLY 3CC  Final   Culture   Final           BLOOD CULTURE RECEIVED NO GROWTH TO DATE CULTURE WILL BE HELD FOR 5 DAYS BEFORE ISSUING A FINAL NEGATIVE REPORT Performed at [facility]    Report Status PENDING  Incomplete  Culture, blood (routine x 2)     Status: None (Preliminary result)   Collection Time: 03/05/14  5:13 PM  Result Value Ref Range Status   Specimen Description BLOOD LEFT ARM  Final   Special Requests BOTTLES DRAWN AEROBIC AND ANAEROBIC 5CC  Final   Culture   Final           BLOOD CULTURE RECEIVED NO GROWTH TO DATE CULTURE WILL BE HELD FOR 5 DAYS BEFORE ISSUING A FINAL NEGATIVE REPORT Performed at [facility]    Report Status PENDING  Incomplete  MRSA PCR Screening     Status: None   Collection Time: 03/06/14  3:51 PM  Result Value Ref Range Status   MRSA by PCR NEGATIVE NEGATIVE Final    Comment:        The GeneXpert MRSA Assay (FDA approved for NASAL specimens only), is one component of a comprehensive MRSA colonization surveillance program. It is not intended to diagnose MRSA infection nor to guide or monitor treatment for MRSA infections.   Cath Tip Culture     Status: None (Preliminary result)   Collection Time: 03/07/14 11:18 AM  Result Value Ref Range Status   Specimen Description PORTA CATH PORT A CATH TUBING AND PORT  Final   Special Requests NONE  Final   Culture   Final    NO GROWTH 1 DAY Performed at [facility]    Report Status PENDING  Incomplete     Studies: No results found.  Scheduled Meds: . cefTRIAXone (ROCEPHIN)  IV  2 g Intravenous Q24H  . dronabinol  5 mg  Oral TID AC  . feeding supplement (ENSURE COMPLETE)  237 mL Oral TID BM  . folic acid  1 mg Oral Daily  . pantoprazole  40 mg Oral BID  . pravastatin  20 mg Oral q1800  . saccharomyces boulardii  250 mg Oral BID  . sodium chloride  3 mL Intravenous Q12H  . traZODone  75 mg Oral QHS   Continuous Infusions:   Principal Problem:   Streptococcal bacteremia Active Problems:   Palliative care encounter   Fever   Liver metastases   Stage IV carcinoma of colon    Time spent: 25 min   [facility] Pager [phone]. If 7PM-7AM, please contact night-coverage at [email or web address], password [email or web address] 03/08/2014, 10:00 AM  LOS: 18 days

## 2014-03-08 NOTE — Progress Notes (Signed)
1 Day Post-Op  Subjective: Pt with some soreness  Objective: Vital signs in last 24 hours: Temp:  [96.8 F (36 C)-98.7 F (37.1 C)] 98.7 F (37.1 C) (01/29 0515) Pulse Rate:  [91-110] 97 (01/29 0515) Resp:  [4-22] 18 (01/29 0515) BP: (106-124)/(74-98) 122/98 mmHg (01/29 0515) SpO2:  [97 %-100 %] 97 % (01/29 0515) Last BM Date: 03/07/14  Intake/Output from previous day: 01/28 0701 - 01/29 0700 In: 1460 [P.O.:960; I.V.:500] Out: 1930 [Urine:1925; Blood:5] Intake/Output this shift: Total I/O In: 240 [P.O.:240] Out: -   Incision/Wound: PAC wound c/d/i  Lab Results:   Recent Labs  03/06/14 0034 03/08/14 0425  WBC 15.4* 17.6*  HGB 9.2* 9.2*  HCT 29.4* 29.8*  PLT 427* 400   BMET  Recent Labs  03/08/14 0425  NA 134*  K 4.3  CL 92*  CO2 32  GLUCOSE 104*  BUN 31*  CREATININE 1.22  CALCIUM 8.6    Anti-infectives: Anti-infectives    Start     Dose/Rate Route Frequency Ordered Stop   02/26/14 1630  cefazolin 5 mg/ml + heparin 2500 units/ml antibiotic lock solution 2.5 mL  Status:  Discontinued     2.5 mL Intracatheter Every 24 hours 02/26/14 1407 03/07/14 1609   02/26/14 1600  cefTRIAXone (ROCEPHIN) 2 g in dextrose 5 % 50 mL IVPB - Premix     2 g100 mL/hr over 30 Minutes Intravenous Every 24 hours 02/26/14 1419     02/25/14 2200  cefTRIAXone (ROCEPHIN) 2 g in dextrose 5 % 50 mL IVPB - Premix  Status:  Discontinued     2 g100 mL/hr over 30 Minutes Intravenous Every 24 hours 02/25/14 1702 02/26/14 1419   02/24/14 1600  ceFAZolin (ANCEF) IVPB 2 g/50 mL premix  Status:  Discontinued     2 g100 mL/hr over 30 Minutes Intravenous Every 8 hours 02/24/14 1426 02/25/14 1702   02/22/14 0600  vancomycin (VANCOCIN) IVPB 1000 mg/200 mL premix  Status:  Discontinued     1,000 mg200 mL/hr over 60 Minutes Intravenous Every 12 hours 02/21/14 1348 02/24/14 1424   02/21/14 1500  vancomycin (VANCOCIN) 1,500 mg in sodium chloride 0.9 % 500 mL IVPB     1,500 mg250 mL/hr over 120  Minutes Intravenous  Once 02/21/14 1348 02/21/14 1623   02/20/14 1830  levofloxacin (LEVAQUIN) IVPB 750 mg  Status:  Discontinued     750 mg100 mL/hr over 90 Minutes Intravenous Daily-1800 02/20/14 1812 02/22/14 1055      Assessment/Plan: s/p Procedure(s): REMOVAL PORT-A-CATH (N/A) rec to mobilze as tol  PAC wound site looks great Call back with any questions  LOS: 18 days    Rosario Jacks., Anne Hahn 03/08/2014

## 2014-03-09 LAB — CATH TIP CULTURE: CULTURE: NO GROWTH

## 2014-03-09 LAB — CEA: CEA: 1.3 ng/mL (ref 0.0–4.7)

## 2014-03-09 MED ORDER — ENSURE COMPLETE PO LIQD
237.0000 mL | ORAL | Status: DC | PRN
  Administered 2014-03-09: 237 mL via ORAL
  Filled 2014-03-09: qty 237

## 2014-03-09 MED ORDER — ASPIRIN EC 81 MG PO TBEC
81.0000 mg | DELAYED_RELEASE_TABLET | Freq: Every day | ORAL | Status: DC
  Administered 2014-03-09 – 2014-03-10 (×2): 81 mg via ORAL
  Filled 2014-03-09 (×2): qty 1

## 2014-03-09 NOTE — Progress Notes (Addendum)
[facility] PROGRESS NOTE  [name] IOE:[id number] DOB: 02/23/1955 DOA: 02/18/2014 PCP: No primary care provider on file.  Assessment/Plan: 1. Group B strep bacteremia- blood cultures growing Streptococcus agalactie, patient started on Rocephin and cefazolin. Infectious disease following, possible source of infection is Port-A-Cath as per ID. We  consulted surgical service to remove the Port-A-Cath, which was removed yesterday. Continue with IV antibiotics. Duration as per ID recommendation. 2. Rectal bleeding- improving after patient had flexible sigmoidoscopy with obliteration and cauterization of the tumor. Hemoglobin is 9.2, and has remained stable over the past 5 days. 3. 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 Aspirin was on hold due to GI bleed, called and discussed with [name], and his PA [name]. As per GI it is ok to put back on aspirin, but he is at risk of GI bleed again. Will start back on baby aspirin. 4. DVT prophylaxis- SCDs  Code Status: Full code Family Communication: No family at bedside Disposition Plan: *To be decided   Consultants:  Infectious disease  Surgical service  Palliative care  Gastroenterology  Procedures:  None  Antibiotics:  Rocephin 02/26/2014  Cefazolin 02/26/2014  HPI/Subjective: 59 year old male with a history of hypertension, dyslipidemia, CVA, aortic stenosis and repair of aortic aneurysm at [facility]. Patient was recently hospitalized during she was found to have a sigmoid mass suspicious for colon cancer with liver metastasis. Patient underwent chemotherapy with FOLFOX 01/04/2014 per Cody. [name]. Patient also found to have acute and subacute punctate cortical and subcortical stroke patient was on aspirin. Patient present to the hospital on  02/19/2014 with bright red blood per rectum, GI was consulted and patient underwent flexible sigmoidoscopy with friable areas of tumor obliterated and cauterized  utilizing the argon plasma coagulator. Patient had blood cultures drawn on 02/20/2014 which grew group B strep, infectious disease was consulted. And patient started on IV Rocephin. Patient continues to have intermittent fevers and infectious disease no recommends to remove the Port-A-Cath. Patient denies any pain this morning, still getting drenching sweats. Port a cath was removed yesterday.  Objective: Filed Vitals:   03/09/14 0517  BP: 125/88  Pulse: 93  Temp: 98.4 F (36.9 C)  Resp: 18    Intake/Output Summary (Last 24 hours) at 03/09/14 0954 Last data filed at 03/09/14 0426  Gross per 24 hour  Intake    480 ml  Output   1150 ml  Net   -670 ml   Filed Weights   03/06/14 0548 03/07/14 0715 03/09/14 0500  Weight: 70.67 kg (155 lb 12.8 oz) 68.947 kg (152 lb) 69.763 kg (153 lb 12.8 oz)    Exam:  Physical Exam: Eyes: No icterus, extraocular muscles intact  Lungs: Normal respiratory effort, bilateral clear to auscultation, no crackles or wheezes.  Heart: Regular rate and rhythm, S1 and S2 normal, positive 4/6 systolic murmur in the mitral area  Abdomen: BS normoactive,soft,nondistended,non-tender to palpation,no organomegaly Extremities: No pretibial edema, no erythema, no cyanosis, no clubbing Neuro : Alert and oriented to time, place and person, No focal deficits   Data Reviewed: Basic Metabolic Panel:  Recent Labs Lab 03/08/14 0425  NA 134*  K 4.3  CL 92*  CO2 32  GLUCOSE 104*  BUN 31*  CREATININE 1.22  CALCIUM 8.6   Liver Function Tests: No results for input(s): AST, ALT, ALKPHOS, BILITOT, PROT, ALBUMIN in the last 168 hours. No results for input(s): LIPASE, AMYLASE in the last 168 hours. No results for input(s): AMMONIA in the last  168 hours. CBC:  Recent Labs Lab 03/06/14 0034 03/08/14 0425  WBC 15.4* 17.6*  HGB 9.2* 9.2*  HCT 29.4* 29.8*  MCV 93.3 93.4  PLT 427* 400   Cardiac Enzymes: No results for input(s): CKTOTAL, CKMB, CKMBINDEX,  TROPONINI in the last 168 hours. BNP (last 3 results) No results for input(s): PROBNP in the last 8760 hours. CBG: No results for input(s): GLUCAP in the last 168 hours.  Recent Results (from the past 240 hour(s))  Culture, blood (routine x 2)     Status: None (Preliminary result)   Collection Time: 03/05/14  4:34 PM  Result Value Ref Range Status   Specimen Description BLOOD RIGHT ARM  Final   Special Requests BOTTLES DRAWN AEROBIC ONLY 3CC  Final   Culture   Final           BLOOD CULTURE RECEIVED NO GROWTH TO DATE CULTURE WILL BE HELD FOR 5 DAYS BEFORE ISSUING A FINAL NEGATIVE REPORT Performed at [facility]    Report Status PENDING  Incomplete  Culture, blood (routine x 2)     Status: None (Preliminary result)   Collection Time: 03/05/14  5:13 PM  Result Value Ref Range Status   Specimen Description BLOOD LEFT ARM  Final   Special Requests BOTTLES DRAWN AEROBIC AND ANAEROBIC 5CC  Final   Culture   Final           BLOOD CULTURE RECEIVED NO GROWTH TO DATE CULTURE WILL BE HELD FOR 5 DAYS BEFORE ISSUING A FINAL NEGATIVE REPORT Performed at [facility]    Report Status PENDING  Incomplete  MRSA PCR Screening     Status: None   Collection Time: 03/06/14  3:51 PM  Result Value Ref Range Status   MRSA by PCR NEGATIVE NEGATIVE Final    Comment:        The GeneXpert MRSA Assay (FDA approved for NASAL specimens only), is one component of a comprehensive MRSA colonization surveillance program. It is not intended to diagnose MRSA infection nor to guide or monitor treatment for MRSA infections.   Anaerobic culture     Status: None (Preliminary result)   Collection Time: 03/07/14 11:18 AM  Result Value Ref Range Status   Specimen Description PORTA CATH PORT A CATH TUBING AND PORT  Final   Special Requests NONE  Final   Gram Stain   Final    RARE WBC PRESENT, PREDOMINANTLY PMN NO SQUAMOUS EPITHELIAL CELLS SEEN NO ORGANISMS SEEN Performed at [facility]    Culture   Final    NO ANAEROBES ISOLATED; CULTURE IN PROGRESS FOR 5 DAYS Performed at [facility]    Report Status PENDING  Incomplete  Cath Tip Culture     Status: None   Collection Time: 03/07/14 11:18 AM  Result Value Ref Range Status   Specimen Description PORTA CATH PORT A CATH TUBING AND PORT  Final   Special Requests NONE  Final   Culture   Final    NO GROWTH 2 DAYS Performed at [facility]    Report Status 03/09/2014 FINAL  Final     Studies: No results found.  Scheduled Meds: . dronabinol  5 mg Oral TID AC  . feeding supplement (ENSURE COMPLETE)  237 mL Oral TID BM  . folic acid  1 mg Oral Daily  . lidocaine  1 patch Transdermal Q24H  . pantoprazole  40 mg Oral BID  . pravastatin  20 mg Oral q1800  .  saccharomyces boulardii  250 mg Oral BID  . sodium chloride  3 mL Intravenous Q12H  . traZODone  75 mg Oral QHS   Continuous Infusions:   Principal Problem:   Streptococcal bacteremia Active Problems:   Palliative care encounter   Fever   Liver metastases   Stage IV carcinoma of colon    Time spent: 25 min   [facility] Pager [phone]. If 7PM-7AM, please contact night-coverage at [email or web address], password [facility] 03/09/2014, 9:54 AM  LOS: 19 days

## 2014-03-10 MED ORDER — PANTOPRAZOLE SODIUM 40 MG PO TBEC: 40.0000 mg | DELAYED_RELEASE_TABLET | Freq: Every day | ORAL | Status: DC

## 2014-03-10 MED ORDER — METOPROLOL SUCCINATE ER 50 MG PO TB24: 50.0000 mg | ORAL_TABLET | Freq: Every day | ORAL | Status: DC

## 2014-03-10 MED ORDER — ASPIRIN 81 MG PO TBEC: 81.0000 mg | DELAYED_RELEASE_TABLET | Freq: Every day | ORAL | Status: DC

## 2014-03-10 MED ORDER — ISOSORBIDE DINITRATE 40 MG PO TABS: 40.0000 mg | ORAL_TABLET | Freq: Every day | ORAL | Status: DC

## 2014-03-10 MED ORDER — LIDOCAINE 5 % EX PTCH
1.0000 | MEDICATED_PATCH | CUTANEOUS | Status: DC
Start: 2014-03-10 — End: 2014-06-01

## 2014-03-10 MED ORDER — HYDROCODONE-ACETAMINOPHEN 5-500 MG PO TABS: 1.0000 | ORAL_TABLET | Freq: Four times a day (QID) | ORAL | Status: DC | PRN

## 2014-03-10 NOTE — Progress Notes (Signed)
Patient discharged home, all discharge medications and instructions reviewed and questions answered.  Patient assisted to vehicle by wheelchair. 

## 2014-03-10 NOTE — Care Management Note (Signed)
CARE MANAGEMENT NOTE 03/10/2014  Patient:  [name], [name]   Account Number:  [id number]  Date Initiated:  02/19/2014  Documentation initiated by:  [facility]  Subjective/Objective Assessment:   adm: Bright red blood per rectum.     Action/Plan:   From home.   Anticipated DC Date:  03/04/2014   Anticipated DC Plan:  [place]  CM consult      Choice offered to / List presented to:  C-1 Patient           Status of service:  Completed, signed off Medicare Important Message given?   (If response is "NO", the following Medicare IM given date fields will be blank) Date Medicare IM given:   Medicare IM given by:   Date Additional Medicare IM given:   Additional Medicare IM given by:    Discharge Disposition:  HOME/SELF CARE  Per UR Regulation:  Reviewed for med. necessity/level of care/duration of stay  If discussed at [place] of Stay Meetings, dates discussed:   02/26/2014  02/28/2014    Comments:  03/10/14 1415 - CM called patient (after discharge) after discussing his request for a walker or cane with his nurse. At the time of the call from his nurse he was ready to leave the hospital. Informed a walker can be delivered to his home but it will take a couple of days per [facility]. Provided the option of going to the [place] to purchase the walker. Patient states he would prefer a cane because the walker will be too cumbersome for him. He has a ride now and asked where he could get a cane. Discussed the availability of a cane at a drug store. Informed they are about the price of a walker if he were to go to [facility]. He states he will go to the drug store. Patient reported he walks without DME when CM visited him at the beside prior to discharge. No report of DME needs at that time. Venita Sheffield RN BSN CCM [phone]  03/10/14 1230 CM spoke with patient and his son. He will be leaving to go to [place], [place] within 1 - 2 days. Plans to  see a physician there and attempt to start chemotherapy. Discussed speaking with the physician there about home health PT. Informed if HHPT is set up for [facility] now, he would be billed by the vendor. He declines. Per the physician he can be discharged now with his son. Family to f/u on care when he arrives in [facility]. Notified Stephanie at [facility] of patient's plan to leave for [facility]. Venita Sheffield RN BSN CCM [phone]  03/01/14 Lars Mage BSN NCM [place] care.PT-HH, cane.HHPT not covered,but can have HHRN(safety eval), social worker-resources.[facility] chosen [facility] aware & following for [place] orders.  02/27/14 Dessa Phi RN BSN NCM [phone] ID-PAC-strep bacteremia-iv abx.Soft diet. Palliative-full code.d/C plan home.  02/22/14 Dessa Phi RN BSN NCM [phone] Palliative care following.PCCM-acute diastolic dysfunction-iv lasix x1-signed off.Monitor progress.  02/19/14 14:45 CM notes pt has had home health services in past provided by [facility].  Pt

## 2014-03-10 NOTE — Discharge Summary (Signed)
Physician Discharge Summary  [name] YSA:[id number] DOB: 27-Mar-1955 DOA: 02/18/2014  PCP: No primary care provider on file.  Admit date: 02/18/2014 Discharge date: 03/10/2014  Time spent: 50* minutes  Recommendations for Outpatient Follow-up:  Follow up PCP in 2 weeks  Discharge Diagnoses:  Principal Problem:   Streptococcal bacteremia Active Problems:   Palliative care encounter   Fever   Liver metastases   Stage IV carcinoma of colon   Discharge Condition: Stable  Diet recommendation: Regular diet  Filed Weights   03/06/14 0548 03/07/14 0715 03/09/14 0500  Weight: 70.67 kg (155 lb 12.8 oz) 68.947 kg (152 lb) 69.763 kg (153 lb 12.8 oz)    History of present illness:  59 year old male with a history of hypertension, dyslipidemia, CVA, aortic stenosis and repair of aortic aneurysm at [facility]. Patient was recently hospitalized during she was found to have a sigmoid mass suspicious for colon cancer with liver metastasis. Patient underwent chemotherapy with FOLFOX 01/04/2014 per Dr. Marin [name]. Patient also found to have acute and subacute punctate cortical and subcortical stroke patient was on aspirin. Patient present to the hospital on 02/19/2014 with bright red blood per rectum, GI was consulted and patient underwent flexible sigmoidoscopy with friable areas of tumor obliterated and cauterized utilizing the argon plasma coagulator. Patient had blood cultures drawn on 02/20/2014 which grew group B strep, infectious disease was consulted IV antibiotics were started . And patient started on IV Rocephin. Patient continues to have intermittent fevers and infectious disease  recommends to remove the Port-A-Cath.    Hospital Course:  *Group B strep bacteremia- blood cultures growing Streptococcus agalactie, patient started on Rocephin and cefazolin. Infectious disease following, possible source of infection is Port-A-Cath as per ID. We consulted surgical service to remove the  Port-A-Cath, which was removed yesterday. After the removal of Port a cath the fever has subsided. ID service has stopped the antibiotics. He received two weeks of antibiotics in the hospital. Rectal bleeding- improving after patient had flexible sigmoidoscopy with obliteration and cauterization of the tumor. Hemoglobin is 9.2, and has remained stable over the past 5 days. 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 Aspirin was on hold due to GI bleed, called and discussed with Dr [name], and his PA [name]. As per GI it is ok to put back on aspirin, but he is at risk of GI bleed again. Will start back on baby aspirin. Metastatic colon cancer- Patient wants to discuss further options in [place], as he is going to move with his son, who is here to take him to [place].   Procedures:  None  Consultations:  Infectious disease  Gastroenterology    Discharge Exam: Filed Vitals:   03/10/14 0512  BP: 126/92  Pulse: 96  Temp: 97.9 F (36.6 C)  Resp: 22      Discharge Instructions   Discharge Instructions    Diet - low sodium heart healthy    Complete by:  As directed      Increase activity slowly    Complete by:  As directed           Current Discharge Medication List    START taking these medications   Details  HYDROcodone-acetaminophen (VICODIN) 5-500 MG per tablet Take 1 tablet by mouth every 6 (six) hours as needed for pain. Qty: 30 tablet, Refills: 0    pantoprazole (PROTONIX) 40 MG tablet Take 1 tablet (40 mg total) by mouth daily. Qty: 30 tablet, Refills: 2  CONTINUE these medications which have CHANGED   Details  aspirin EC 81 MG EC tablet Take 1 tablet (81 mg total) by mouth daily. Qty: 30 tablet, Refills: 2    isosorbide dinitrate (ISORDIL) 40 MG tablet Take 1 tablet (40 mg total) by mouth daily. Qty: 60 tablet, Refills: 0   Associated Diagnoses: Lactic acidosis    metoprolol succinate (TOPROL-XL) 50 MG 24 hr tablet Take 1 tablet (50 mg  total) by mouth daily. Take with or immediately following a meal. Qty: 60 tablet, Refills: 0      CONTINUE these medications which have NOT CHANGED   Details  amLODipine (NORVASC) 5 MG tablet Take 1 tablet (5 mg total) by mouth daily. Qty: 30 tablet, Refills: 0    bismuth subsalicylate (PEPTO BISMOL) 262 MG/15ML suspension Take 30 mLs by mouth 4 (four) times daily -  before meals and at bedtime. Qty: 360 mL, Refills: 0   Associated Diagnoses: Hepatomegalia    diphenoxylate-atropine (LOMOTIL) 2.5-0.025 MG per tablet Take 2 tablets by mouth 4 (four) times daily. Qty: 30 tablet, Refills: 0    dronabinol (MARINOL) 5 MG capsule Take 1 capsule (5 mg total) by mouth 3 (three) times daily before meals. Qty: 60 capsule, Refills: 0   Associated Diagnoses: Malignant neoplasm of colon    feeding supplement, RESOURCE BREEZE, (RESOURCE BREEZE) LIQD Take 1 Container by mouth 3 (three) times daily between meals. Qty: 237 mL, Refills: 0   Associated Diagnoses: Hepatomegalia    folic acid (FOLVITE) 1 MG tablet Take 1 tablet (1 mg total) by mouth daily. Qty: 30 tablet, Refills: 0   Associated Diagnoses: Hepatomegalia; Malignant neoplasm of colon    lidocaine-prilocaine (EMLA) cream Apply 1 application topically as needed. Apply to [facility] site 1 hour prior to appointment time. Qty: 30 g, Refills: 12    oxyCODONE (OXY IR/ROXICODONE) 5 MG immediate release tablet Take 1 tablet (5 mg total) by mouth every 4 (four) hours as needed for severe pain. Qty: 30 tablet, Refills: 0    pravastatin (PRAVACHOL) 20 MG tablet Take 1 tablet (20 mg total) by mouth daily at 6 PM. Qty: 30 tablet, Refills: 0    saccharomyces boulardii (FLORASTOR) 250 MG capsule Take 1 capsule (250 mg total) by mouth 2 (two) times daily. Qty: 60 capsule, Refills: 0   Associated Diagnoses: Hepatomegalia    traZODone (DESYREL) 50 MG tablet Take 1 tablet (50 mg total) by mouth at bedtime as needed for sleep. Qty: 30 tablet, Refills: 0       STOP taking these medications     hydrALAZINE (APRESOLINE) 25 MG tablet      potassium chloride SA (K-DUR,KLOR-CON) 20 MEQ tablet        Allergies  Allergen Reactions  . Penicillins     childhood      The results of significant diagnostics from this hospitalization (including imaging, microbiology, ancillary and laboratory) are listed below for reference.    Significant Diagnostic Studies: Dg Chest 1 View  02/21/2014   CLINICAL DATA:  Central line placement.  EXAM: CHEST - 1 VIEW  COMPARISON:  02/21/2014; 02/20/2014; chest CTA - 02/21/2014  FINDINGS: Grossly unchanged enlarged cardiac silhouette and mediastinal contours post median sternotomy and CABG. Similar findings of a right-sided aortic arch and the sequela of prior endovascular stent graft repair of the thoracic aorta. Interval placement of a new left jugular approach central venous catheter whose tip projects over the thoracic aortic stent graft. No pneumothorax. No new focal airspace opacities. No pleural  effusion. No definite evidence of edema. Unchanged bones.  IMPRESSION: 1. Interval placement of a new left side approach central catheter. The tip of the new central venous catheter overlies the right-sided aortic arch stent graft and while courses along the patient's left anterior chest wall Port a catheter tubing (which was noted to be appropriately positioned on recent performed chest CTA), the exact location of the tip of the new left sided central line indeterminate on this examination and correlation with blood gas and/or waveforms is recommended. No pneumothorax. 2. Cardiomegaly without acute cardiopulmonary disease. These results will be called to the ordering clinician or representative by the Radiologist Assistant, and communication documented in the PACS or zVision Dashboard.   Electronically Signed   By: Sandi Mariscal M.D.   On: 02/21/2014 19:45   Ct Angio Chest Pe W/cm &/or Wo Cm  02/21/2014   CLINICAL DATA:  Dyspnea  and respiratory distress.  EXAM: CT ANGIOGRAPHY CHEST WITH CONTRAST  TECHNIQUE: Multidetector CT imaging of the chest was performed using the standard protocol during bolus administration of intravenous contrast. Multiplanar CT image reconstructions and MIPs were obtained to evaluate the vascular anatomy.  CONTRAST:  117mL OMNIPAQUE IOHEXOL 350 MG/ML SOLN  COMPARISON:  One-view chest x-ray 02/21/2014.  CT chest 12/31/2013.  FINDINGS: Pulmonary arterial opacification is satisfactory. The study is mildly degraded by breathing motion in the inferior lower lobes. No focal filling defect is present to suggest a pulmonary embolus.  A study graft is present within the right sided aortic arch.  The  The heart is mildly enlarged. Coronary artery calcifications are present. The patient is status post CABG.  Multiple hepatic lesions are again noted.  Cirrhosis is evident.  The lung windows demonstrate stable appearance of a 4 mm nodule within the lingula. A high-density granuloma is present posteriorly in the right lower lobe. Dependent atelectasis has progressed bilaterally. No new nodule, mass, or airspace disease is present otherwise.  The bone windows demonstrate no focal lytic or blastic lesions.  Review of the MIP images confirms the above findings.  IMPRESSION: 1. No evidence for pulmonary embolus. The study is mildly degraded by patient breathing motion at the lung bases bilaterally. 2. Right-sided aortic arch. 3. An aortic stent graft is in place. 4. Stable 4 mm nodule within the lingula. 5. Mild increase in dependent atelectasis.   Electronically Signed   By: Lawrence Santiago M.D.   On: 02/21/2014 14:31   Dg Chest Port 1 View  02/21/2014   CLINICAL DATA:  Edema.  EXAM: PORTABLE CHEST - 1 VIEW  COMPARISON:  02/20/2014.  FINDINGS: Power port catheter in stable position. Mediastinum and hilar structures are stable. Right-sided aorta with aortic stent graft, stable in appearance. Stable cardiomegaly with stable mild  pulmonary vascular and interstitial prominence. Mild CHF cannot be excluded. No pleural effusion or pneumothorax.  IMPRESSION: 1. Prior pleural catheter stable position. 2. Right-sided aorta with stent graft, stable in appearance. 3. Prior CABG. Stable cardiomegaly with stable mild pulmonary venous congestion and mild interstitial prominence suggesting mild interstitial edema.   Electronically Signed   By: Marcello Moores  Register   On: 02/21/2014 07:27   Dg Chest Port 1 View  02/20/2014   CLINICAL DATA:  Acute onset of shortness of breath. Subsequent encounter.  EXAM: PORTABLE CHEST - 1 VIEW  COMPARISON:  Chest radiograph performed earlier today at 6:31 p.m.  FINDINGS: The lungs are well-aerated. Vascular congestion is again noted, with minimal bilateral atelectasis. There is no evidence of pleural effusion  or pneumothorax.  The cardiomediastinal silhouette is mildly enlarged. The patient is status post median sternotomy. An aortic stent graft is again noted. A left IJ line is noted ending about the mid SVC. No acute osseous abnormalities are seen.  IMPRESSION: Vascular congestion and mild cardiomegaly again noted, with minimal bilateral atelectasis.   Electronically Signed   By: Garald Balding M.D.   On: 02/20/2014 22:42   Dg Chest Port 1 View  02/20/2014   CLINICAL DATA:  Fever, prior heart surgery  EXAM: PORTABLE CHEST - 1 VIEW  COMPARISON:  01/06/2014  FINDINGS: There is mild bilateral interstitial thickening. There is no focal parenchymal opacity, pleural effusion, or pneumothorax. There is stable cardiomegaly. There is a right-sided aortic arch with an aortic stent graft present. There is a left IJ central venous catheter with the tip projecting over the SVC.  The osseous structures are unremarkable.  IMPRESSION: 1. Bilateral mild interstitial thickening likely reflecting mild pulmonary vascular congestion. 2. Cardiomegaly with a right-sided aortic arch with an aortic stent graft present.   Electronically  Signed   By: Kathreen Devoid   On: 02/20/2014 19:03    Microbiology: Recent Results (from the past 240 hour(s))  Culture, blood (routine x 2)     Status: None (Preliminary result)   Collection Time: 03/05/14  4:34 PM  Result Value Ref Range Status   Specimen Description BLOOD RIGHT ARM  Final   Special Requests BOTTLES DRAWN AEROBIC ONLY 3CC  Final   Culture   Final           BLOOD CULTURE RECEIVED NO GROWTH TO DATE CULTURE WILL BE HELD FOR 5 DAYS BEFORE ISSUING A FINAL NEGATIVE REPORT Performed at [facility]    Report Status PENDING  Incomplete  Culture, blood (routine x 2)     Status: None (Preliminary result)   Collection Time: 03/05/14  5:13 PM  Result Value Ref Range Status   Specimen Description BLOOD LEFT ARM  Final   Special Requests BOTTLES DRAWN AEROBIC AND ANAEROBIC 5CC  Final   Culture   Final           BLOOD CULTURE RECEIVED NO GROWTH TO DATE CULTURE WILL BE HELD FOR 5 DAYS BEFORE ISSUING A FINAL NEGATIVE REPORT Performed at [facility]    Report Status PENDING  Incomplete  MRSA PCR Screening     Status: None   Collection Time: 03/06/14  3:51 PM  Result Value Ref Range Status   MRSA by PCR NEGATIVE NEGATIVE Final    Comment:        The GeneXpert MRSA Assay (FDA approved for NASAL specimens only), is one component of a comprehensive MRSA colonization surveillance program. It is not intended to diagnose MRSA infection nor to guide or monitor treatment for MRSA infections.   Anaerobic culture     Status: None (Preliminary result)   Collection Time: 03/07/14 11:18 AM  Result Value Ref Range Status   Specimen Description PORTA CATH PORT A CATH TUBING AND PORT  Final   Special Requests NONE  Final   Gram Stain   Final    RARE WBC PRESENT, PREDOMINANTLY PMN NO SQUAMOUS EPITHELIAL CELLS SEEN NO ORGANISMS SEEN Performed at [facility]    Culture   Final    NO ANAEROBES ISOLATED; CULTURE IN PROGRESS FOR 5 DAYS Performed at [facility]    Report Status PENDING  Incomplete  Cath Tip Culture     Status: None  Collection Time: 03/07/14 11:18 AM  Result Value Ref Range Status   Specimen Description PORTA CATH PORT A CATH TUBING AND PORT  Final   Special Requests NONE  Final   Culture   Final    NO GROWTH 2 DAYS Performed at [facility]    Report Status 03/09/2014 FINAL  Final     Labs: Basic Metabolic Panel:  Recent Labs Lab 03/08/14 0425  NA 134*  K 4.3  CL 92*  CO2 32  GLUCOSE 104*  BUN 31*  CREATININE 1.22  CALCIUM 8.6   Liver Function Tests: No results for input(s): AST, ALT, ALKPHOS, BILITOT, PROT, ALBUMIN in the last 168 hours. No results for input(s): LIPASE, AMYLASE in the last 168 hours. No results for input(s): AMMONIA in the last 168 hours. CBC:  Recent Labs Lab 03/06/14 0034 03/08/14 0425  WBC 15.4* 17.6*  HGB 9.2* 9.2*  HCT 29.4* 29.8*  MCV 93.3 93.4  PLT 427* 400   Cardiac Enzymes: No results for input(s): CKTOTAL, CKMB, CKMBINDEX, TROPONINI in the last 168 hours. BNP: BNP (last 3 results) No results for input(s): PROBNP in the last 8760 hours. CBG: No results for input(s): GLUCAP in the last 168 hours.     SignedEleonore Chiquito S  [facility] 03/10/2014, 12:24 PM

## 2014-03-10 NOTE — Care Management Note (Signed)
CARE MANAGEMENT NOTE 03/10/2014  Patient:  [name], [name]   Account Number:  [id number]  Date Initiated:  02/19/2014  Documentation initiated by:  [facility]  Subjective/Objective Assessment:   adm: Bright red blood per rectum.     Action/Plan:   From home.   Anticipated DC Date:  03/04/2014   Anticipated DC Plan:  [place]  CM consult      Choice offered to / List presented to:  C-1 Patient           Status of service:  Completed, signed off Medicare Important Message given?   (If response is "NO", the following Medicare IM given date fields will be blank) Date Medicare IM given:   Medicare IM given by:   Date Additional Medicare IM given:   Additional Medicare IM given by:    Discharge Disposition:  HOME/SELF CARE  Per UR Regulation:  Reviewed for med. necessity/level of care/duration of stay  If discussed at [place] of Stay Meetings, dates discussed:   02/26/2014  02/28/2014    Comments:  03/10/14 1230 CM spoke with patient and his son. He will be leaving to go to [place], [place] within 1 - 2 days. Plans to see a physician there and attempt to start chemotherapy. Discussed speaking with the physician there about home health PT. Informed if HHPT is set up for [facility] now, he would be billed by the vendor. He declines. Per the physician he can be discharged now with his son. Family to f/u on care when he arrives in [facility]. Notified Stephanie at [facility] of patient's plan to leave for [facility]. Venita Sheffield RN BSN CCM [phone]  03/01/14 Lars Mage BSN NCM [place] care.PT-HH, cane.HHPT not covered,but can have HHRN(safety eval), social worker-resources.[facility] chosen [facility] aware & following for [place] orders.  02/27/14 Dessa Phi RN BSN NCM [phone] ID-PAC-strep bacteremia-iv abx.Soft diet. Palliative-full code.d/C plan home.  02/22/14 Dessa Phi RN BSN NCM [phone] Palliative care following.PCCM-acute diastolic dysfunction-iv  lasix x1-signed off.Monitor progress.  02/19/14 14:45 CM notes pt has had home health services in past provided by [facility].  Pt

## 2014-03-10 NOTE — Progress Notes (Signed)
[facility] PROGRESS NOTE  [name] WNI:[id number] DOB: Jul 13, 1955 DOA: 02/18/2014 PCP: No primary care provider on file.  Assessment/Plan: 1. Group B strep bacteremia- blood cultures growing Streptococcus agalactie, patient started on Rocephin and cefazolin. Infectious disease following, possible source of infection is Port-A-Cath as per ID. We  consulted surgical service to remove the Port-A-Cath, which was removed yesterday. Continue with IV antibiotics. Duration as per ID recommendation. 2. Rectal bleeding- improving after patient had flexible sigmoidoscopy with obliteration and cauterization of the tumor. Hemoglobin is 9.2, and has remained stable over the past 5 days. 3. 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 Aspirin was on hold due to GI bleed, called and discussed with [name], and his PA [name]. As per GI it is ok to put back on aspirin, but he is at risk of GI bleed again. Will start back on baby aspirin. 4. DVT prophylaxis- SCDs  Code Status: Full code Family Communication: No family at bedside Disposition Plan: *To be decided   Consultants:  Infectious disease  Surgical service  Palliative care  Gastroenterology  Procedures:  None  Antibiotics:  Rocephin 02/26/2014  Cefazolin 02/26/2014  HPI/Subjective: 59 year old male with a history of hypertension, dyslipidemia, CVA, aortic stenosis and repair of aortic aneurysm at [facility]. Patient was recently hospitalized during she was found to have a sigmoid mass suspicious for colon cancer with liver metastasis. Patient underwent chemotherapy with FOLFOX 01/04/2014 per Dr. [name]. Patient also found to have acute and subacute punctate cortical and subcortical stroke patient was on aspirin. Patient present to the hospital on  02/19/2014 with bright red blood per rectum, GI was consulted and patient underwent flexible sigmoidoscopy with friable areas of tumor obliterated and cauterized  utilizing the argon plasma coagulator. Patient had blood cultures drawn on 02/20/2014 which grew group B strep, infectious disease was consulted. And patient started on IV Rocephin. Patient continues to have intermittent fevers and infectious disease no recommends to remove the Port-A-Cath. Patient denies any pain this morning, still getting drenching sweats. Port a cath was removed, his son is coming from [place] to discuss the d/c planning.  Objective: Filed Vitals:   03/10/14 0512  BP: 126/92  Pulse: 96  Temp: 97.9 F (36.6 C)  Resp: 22    Intake/Output Summary (Last 24 hours) at 03/10/14 1029 Last data filed at 03/10/14 0941  Gross per 24 hour  Intake    243 ml  Output   1400 ml  Net  -1157 ml   Filed Weights   03/06/14 0548 03/07/14 0715 03/09/14 0500  Weight: 70.67 kg (155 lb 12.8 oz) 68.947 kg (152 lb) 69.763 kg (153 lb 12.8 oz)    Exam:  Physical Exam: Eyes: No icterus, extraocular muscles intact  Lungs: Normal respiratory effort, bilateral clear to auscultation, no crackles or wheezes.  Heart: Regular rate and rhythm, S1 and S2 normal, positive 4/6 systolic murmur in the mitral area  Abdomen: BS normoactive,soft,nondistended,non-tender to palpation,no organomegaly Extremities: No pretibial edema, no erythema, no cyanosis, no clubbing Neuro : Alert and oriented to time, place and person, No focal deficits   Data Reviewed: Basic Metabolic Panel:  Recent Labs Lab 03/08/14 0425  NA 134*  K 4.3  CL 92*  CO2 32  GLUCOSE 104*  BUN 31*  CREATININE 1.22  CALCIUM 8.6   Liver Function Tests: No results for input(s): AST, ALT, ALKPHOS, BILITOT, PROT, ALBUMIN in the last 168 hours. No results for input(s): LIPASE, AMYLASE in the last 168  hours. No results for input(s): AMMONIA in the last 168 hours. CBC:  Recent Labs Lab 03/06/14 0034 03/08/14 0425  WBC 15.4* 17.6*  HGB 9.2* 9.2*  HCT 29.4* 29.8*  MCV 93.3 93.4  PLT 427* 400   Cardiac Enzymes: No  results for input(s): CKTOTAL, CKMB, CKMBINDEX, TROPONINI in the last 168 hours. BNP (last 3 results) No results for input(s): PROBNP in the last 8760 hours. CBG: No results for input(s): GLUCAP in the last 168 hours.  Recent Results (from the past 240 hour(s))  Culture, blood (routine x 2)     Status: None (Preliminary result)   Collection Time: 03/05/14  4:34 PM  Result Value Ref Range Status   Specimen Description BLOOD RIGHT ARM  Final   Special Requests BOTTLES DRAWN AEROBIC ONLY 3CC  Final   Culture   Final           BLOOD CULTURE RECEIVED NO GROWTH TO DATE CULTURE WILL BE HELD FOR 5 DAYS BEFORE ISSUING A FINAL NEGATIVE REPORT Performed at [facility]    Report Status PENDING  Incomplete  Culture, blood (routine x 2)     Status: None (Preliminary result)   Collection Time: 03/05/14  5:13 PM  Result Value Ref Range Status   Specimen Description BLOOD LEFT ARM  Final   Special Requests BOTTLES DRAWN AEROBIC AND ANAEROBIC 5CC  Final   Culture   Final           BLOOD CULTURE RECEIVED NO GROWTH TO DATE CULTURE WILL BE HELD FOR 5 DAYS BEFORE ISSUING A FINAL NEGATIVE REPORT Performed at [facility]    Report Status PENDING  Incomplete  MRSA PCR Screening     Status: None   Collection Time: 03/06/14  3:51 PM  Result Value Ref Range Status   MRSA by PCR NEGATIVE NEGATIVE Final    Comment:        The GeneXpert MRSA Assay (FDA approved for NASAL specimens only), is one component of a comprehensive MRSA colonization surveillance program. It is not intended to diagnose MRSA infection nor to guide or monitor treatment for MRSA infections.   Anaerobic culture     Status: None (Preliminary result)   Collection Time: 03/07/14 11:18 AM  Result Value Ref Range Status   Specimen Description PORTA CATH PORT A CATH TUBING AND PORT  Final   Special Requests NONE  Final   Gram Stain   Final    RARE WBC PRESENT, PREDOMINANTLY PMN NO SQUAMOUS EPITHELIAL CELLS SEEN NO  ORGANISMS SEEN Performed at [facility]    Culture   Final    NO ANAEROBES ISOLATED; CULTURE IN PROGRESS FOR 5 DAYS Performed at [facility]    Report Status PENDING  Incomplete  Cath Tip Culture     Status: None   Collection Time: 03/07/14 11:18 AM  Result Value Ref Range Status   Specimen Description PORTA CATH PORT A CATH TUBING AND PORT  Final   Special Requests NONE  Final   Culture   Final    NO GROWTH 2 DAYS Performed at [facility]    Report Status 03/09/2014 FINAL  Final     Studies: No results found.  Scheduled Meds: . aspirin EC  81 mg Oral Daily  . dronabinol  5 mg Oral TID AC  . folic acid  1 mg Oral Daily  . lidocaine  1 patch Transdermal Q24H  . pantoprazole  40 mg Oral BID  . pravastatin  20 mg Oral q1800  . saccharomyces boulardii  250 mg Oral BID  . sodium chloride  3 mL Intravenous Q12H  . traZODone  75 mg Oral QHS   Continuous Infusions:   Principal Problem:   Streptococcal bacteremia Active Problems:   Palliative care encounter   Fever   Liver metastases   Stage IV carcinoma of colon    Time spent: 25 min   [name] Pager [phone]. If 7PM-7AM, please contact night-coverage at [email or web address], password [facility] 03/10/2014, 10:29 AM  LOS: 20 days

## 2014-03-11 LAB — CULTURE, BLOOD (ROUTINE X 2)
Culture: NO GROWTH
Culture: NO GROWTH

## 2014-03-11 NOTE — Addendum Note (Signed)
Addendum  created 03/11/14 1541 by Tiajuana Amass, MD   Modules edited: Anesthesia Attestations

## 2014-03-12 LAB — ANAEROBIC CULTURE

## 2014-05-29 ENCOUNTER — Inpatient Hospital Stay (HOSPITAL_COMMUNITY)
Admission: EM | Admit: 2014-05-29 | Discharge: 2014-06-01 | DRG: 375 | Disposition: A | Discharge status: Hospice | Payer: Medicaid Other | Attending: Family Medicine | Admitting: Family Medicine

## 2014-05-29 ENCOUNTER — Encounter (HOSPITAL_COMMUNITY): Payer: Self-pay | Admitting: Emergency Medicine

## 2014-05-29 DIAGNOSIS — E46 Unspecified protein-calorie malnutrition: Secondary | ICD-10-CM | POA: Diagnosis present

## 2014-05-29 DIAGNOSIS — R109 Unspecified abdominal pain: Secondary | ICD-10-CM

## 2014-05-29 DIAGNOSIS — R630 Anorexia: Secondary | ICD-10-CM | POA: Diagnosis present

## 2014-05-29 DIAGNOSIS — Z66 Do not resuscitate: Secondary | ICD-10-CM | POA: Diagnosis present

## 2014-05-29 DIAGNOSIS — K921 Melena: Secondary | ICD-10-CM

## 2014-05-29 DIAGNOSIS — Z823 Family history of stroke: Secondary | ICD-10-CM

## 2014-05-29 DIAGNOSIS — Z833 Family history of diabetes mellitus: Secondary | ICD-10-CM

## 2014-05-29 DIAGNOSIS — K5900 Constipation, unspecified: Secondary | ICD-10-CM | POA: Diagnosis present

## 2014-05-29 DIAGNOSIS — Z8673 Personal history of transient ischemic attack (TIA), and cerebral infarction without residual deficits: Secondary | ICD-10-CM

## 2014-05-29 DIAGNOSIS — Z8249 Family history of ischemic heart disease and other diseases of the circulatory system: Secondary | ICD-10-CM

## 2014-05-29 DIAGNOSIS — K922 Gastrointestinal hemorrhage, unspecified: Secondary | ICD-10-CM | POA: Diagnosis present

## 2014-05-29 DIAGNOSIS — R7989 Other specified abnormal findings of blood chemistry: Secondary | ICD-10-CM

## 2014-05-29 DIAGNOSIS — Z951 Presence of aortocoronary bypass graft: Secondary | ICD-10-CM

## 2014-05-29 DIAGNOSIS — Z7902 Long term (current) use of antithrombotics/antiplatelets: Secondary | ICD-10-CM

## 2014-05-29 DIAGNOSIS — R778 Other specified abnormalities of plasma proteins: Secondary | ICD-10-CM | POA: Diagnosis present

## 2014-05-29 DIAGNOSIS — R188 Other ascites: Secondary | ICD-10-CM | POA: Diagnosis present

## 2014-05-29 DIAGNOSIS — Z515 Encounter for palliative care: Secondary | ICD-10-CM

## 2014-05-29 DIAGNOSIS — C189 Malignant neoplasm of colon, unspecified: Principal | ICD-10-CM | POA: Diagnosis present

## 2014-05-29 DIAGNOSIS — I10 Essential (primary) hypertension: Secondary | ICD-10-CM | POA: Diagnosis present

## 2014-05-29 DIAGNOSIS — Z9221 Personal history of antineoplastic chemotherapy: Secondary | ICD-10-CM

## 2014-05-29 DIAGNOSIS — C787 Secondary malignant neoplasm of liver and intrahepatic bile duct: Secondary | ICD-10-CM | POA: Diagnosis present

## 2014-05-29 DIAGNOSIS — R160 Hepatomegaly, not elsewhere classified: Secondary | ICD-10-CM

## 2014-05-29 LAB — POC OCCULT BLOOD, ED: Fecal Occult Bld: POSITIVE — AB

## 2014-05-29 MED ORDER — SODIUM CHLORIDE 0.9 % IV BOLUS (SEPSIS)
500.0000 mL | Freq: Once | INTRAVENOUS | Status: AC
  Administered 2014-05-29: 500 mL via INTRAVENOUS

## 2014-05-29 MED ORDER — FENTANYL CITRATE (PF) 100 MCG/2ML IJ SOLN
50.0000 ug | Freq: Once | INTRAMUSCULAR | Status: AC
  Administered 2014-05-29: 50 ug via INTRAVENOUS
  Filled 2014-05-29: qty 2

## 2014-05-29 NOTE — ED Notes (Signed)
Pt states he is unable to give a urine sample at this time. Pt did attempt to give urine sample but was unable to urinate

## 2014-05-29 NOTE — ED Provider Notes (Signed)
CSN: [id number]     Arrival date & time 05/29/14  2010 History   First MD Initiated Contact with Patient 05/29/14 2227     No chief complaint on file.    (Consider location/radiation/quality/duration/timing/severity/associated sxs/prior Treatment) HPI  59 year old male presents with dark black stools for the past 4 days. States he has stools frequently. Denies a nausea or vomiting. States he has generalized abdominal pain that is typical whenever he has these bleeding episodes. Has felt dizzy but states has been for at least the last couple weeks. His mostly with standing. No chest pain or shortness of breath. Denies being on any blood thinners. Patient endorses taking ibuprofen daily since this started.   Past Medical History  Diagnosis Date  . Hypertension   . Cerebral embolism   . Malignant neoplasm of colon 12/30/2013   Past Surgical History  Procedure Laterality Date  . Coronary artery bypass graft    . Flexible sigmoidoscopy N/A 12/30/2013    Procedure: FLEXIBLE SIGMOIDOSCOPY;  Surgeon: Lafayette Dragon, MD;  Location: [facility] ENDOSCOPY;  Service: Endoscopy;  Laterality: N/A;  . Flexible sigmoidoscopy N/A 01/01/2014    Procedure: FLEXIBLE SIGMOIDOSCOPY;  Surgeon: Inda Castle, MD;  Location: [place];  Service: Endoscopy;  Laterality: N/A;  with stent placement  . Colonic stent placement N/A 01/01/2014    Procedure: COLONIC STENT PLACEMENT;  Surgeon: Inda Castle, MD;  Location: [place];  Service: Endoscopy;  Laterality: N/A;  . Portacath placement N/A 01/02/2014    Procedure: INSERTION PORT-A-CATH;  Surgeon: Ralene Ok, MD;  Location: [place];  Service: General;  Laterality: N/A;  . Flexible sigmoidoscopy N/A 02/19/2014    Procedure: FLEXIBLE SIGMOIDOSCOPY;  Surgeon: Inda Castle, MD;  Location: [facility] ENDOSCOPY;  Service: Endoscopy;  Laterality: N/A;  . Port-a-cath removal N/A 03/07/2014    Procedure: REMOVAL PORT-A-CATH;  Surgeon: Stark Klein, MD;  Location: [facility] ORS;   Service: General;  Laterality: N/A;   No family history on file. History  Substance Use Topics  . Smoking status: Never Smoker   . Smokeless tobacco: Never Used  . Alcohol Use: Yes    Review of Systems  Respiratory: Negative for shortness of breath.   Cardiovascular: Negative for chest pain.  Gastrointestinal: Positive for abdominal pain, diarrhea and blood in stool. Negative for nausea, vomiting and rectal pain.  Neurological: Positive for dizziness.  All other systems reviewed and are negative.     Allergies  Penicillins  Home Medications   Prior to Admission medications   Medication Sig Start Date End Date Taking? Authorizing Provider  atorvastatin (LIPITOR) 80 MG tablet Take 80 mg by mouth at bedtime.   Yes Historical Provider, MD  clopidogrel (PLAVIX) 75 MG tablet Take 75 mg by mouth daily.   Yes Historical Provider, MD  lisinopril (PRINIVIL,ZESTRIL) 20 MG tablet Take 20 mg by mouth daily.   Yes Historical Provider, MD  amLODipine (NORVASC) 5 MG tablet Take 1 tablet (5 mg total) by mouth daily. Patient not taking: Reported on 05/29/2014 01/14/14   Robbie Lis, MD  aspirin EC 81 MG EC tablet Take 1 tablet (81 mg total) by mouth daily. Patient not taking: Reported on 05/29/2014 03/10/14   Oswald Hillock, MD  bismuth subsalicylate (PEPTO BISMOL) 262 MG/15ML suspension Take 30 mLs by mouth 4 (four) times daily -  before meals and at bedtime. Patient not taking: Reported on 05/29/2014 01/14/14   Robbie Lis, MD  diphenoxylate-atropine (LOMOTIL) 2.5-0.025 MG per tablet Take 2 tablets by  mouth 4 (four) times daily. Patient not taking: Reported on 05/29/2014 01/14/14   Robbie Lis, MD  dronabinol (MARINOL) 5 MG capsule Take 1 capsule (5 mg total) by mouth 3 (three) times daily before meals. Patient not taking: Reported on 05/29/2014 01/14/14   Robbie Lis, MD  feeding supplement, RESOURCE BREEZE, (RESOURCE BREEZE) LIQD Take 1 Container by mouth 3 (three) times daily between  meals. Patient not taking: Reported on 05/29/2014 01/14/14   Robbie Lis, MD  folic acid (FOLVITE) 1 MG tablet Take 1 tablet (1 mg total) by mouth daily. Patient not taking: Reported on 05/29/2014 01/14/14   Robbie Lis, MD  HYDROcodone-acetaminophen (VICODIN) 5-500 MG per tablet Take 1 tablet by mouth every 6 (six) hours as needed for pain. Patient not taking: Reported on 05/29/2014 03/10/14   Oswald Hillock, MD  isosorbide dinitrate (ISORDIL) 40 MG tablet Take 1 tablet (40 mg total) by mouth daily. Patient not taking: Reported on 05/29/2014 03/10/14   Oswald Hillock, MD  lidocaine (LIDODERM) 5 % Place 1 patch onto the skin daily. Remove & Discard patch within 12 hours or as directed by MD Patient not taking: Reported on 05/29/2014 03/10/14   Oswald Hillock, MD  lidocaine-prilocaine (EMLA) cream Apply 1 application topically as needed. Apply to [facility] site 1 hour prior to appointment time. Patient not taking: Reported on 05/29/2014 01/22/14   Eliezer Bottom, NP  metoprolol succinate (TOPROL-XL) 50 MG 24 hr tablet Take 1 tablet (50 mg total) by mouth daily. Take with or immediately following a meal. Patient not taking: Reported on 05/29/2014 03/10/14   Oswald Hillock, MD  oxyCODONE (OXY IR/ROXICODONE) 5 MG immediate release tablet Take 1 tablet (5 mg total) by mouth every 4 (four) hours as needed for severe pain. Patient not taking: Reported on 05/29/2014 01/14/14   Robbie Lis, MD  pantoprazole (PROTONIX) 40 MG tablet Take 1 tablet (40 mg total) by mouth daily. Patient not taking: Reported on 05/29/2014 03/10/14   Oswald Hillock, MD  pravastatin (PRAVACHOL) 20 MG tablet Take 1 tablet (20 mg total) by mouth daily at 6 PM. Patient not taking: Reported on 05/29/2014 01/14/14   Robbie Lis, MD  saccharomyces boulardii (FLORASTOR) 250 MG capsule Take 1 capsule (250 mg total) by mouth 2 (two) times daily. Patient not taking: Reported on 05/29/2014 01/14/14   Robbie Lis, MD  traZODone (DESYREL) 50 MG tablet Take 1  tablet (50 mg total) by mouth at bedtime as needed for sleep. Patient not taking: Reported on 05/29/2014 01/14/14   Robbie Lis, MD   BP 150/107 mmHg  Pulse 102  Temp(Src) 98.3 F (36.8 C) (Oral)  Resp 23  SpO2 100% Physical Exam  Constitutional: He is oriented to person, place, and time. He appears well-developed and well-nourished.  HENT:  Head: Normocephalic and atraumatic.  Right Ear: External ear normal.  Left Ear: External ear normal.  Nose: Nose normal.  Eyes: Right eye exhibits no discharge. Left eye exhibits no discharge.  Neck: Neck supple.  Cardiovascular: Normal rate, regular rhythm, normal heart sounds and intact distal pulses.   Pulmonary/Chest: Effort normal.  Abdominal: Soft. There is hepatomegaly. There is tenderness in the right upper quadrant.  Genitourinary: Rectal exam shows no external hemorrhoid, no internal hemorrhoid, no mass and no tenderness.  Musculoskeletal: He exhibits no edema.  Neurological: He is alert and oriented to person, place, and time.  Skin: Skin is warm and dry.  Nursing note  and vitals reviewed.   ED Course  Procedures (including critical care time) Labs Review Labs Reviewed  COMPREHENSIVE METABOLIC PANEL - Abnormal; Notable for the following:    Sodium 134 (*)    BUN 31 (*)    Creatinine, Ser 1.38 (*)    Albumin 2.5 (*)    AST 129 (*)    Alkaline Phosphatase 625 (*)    Total Bilirubin 2.1 (*)    GFR calc non Af Amer 54 (*)    GFR calc Af Amer 63 (*)    All other components within normal limits  CBC WITH DIFFERENTIAL/PLATELET - Abnormal; Notable for the following:    WBC 20.7 (*)    RBC 4.05 (*)    Hemoglobin 11.6 (*)    HCT 35.0 (*)    RDW 20.4 (*)    Neutrophils Relative % 78 (*)    Lymphocytes Relative 10 (*)    Neutro Abs 16.1 (*)    Monocytes Absolute 2.5 (*)    All other components within normal limits  TROPONIN I - Abnormal; Notable for the following:    Troponin I 0.08 (*)    All other components within normal  limits  POC OCCULT BLOOD, ED - Abnormal; Notable for the following:    Fecal Occult Bld POSITIVE (*)    All other components within normal limits  PROTIME-INR  AMMONIA  URINALYSIS, ROUTINE W REFLEX MICROSCOPIC    Imaging Review No results found.   EKG Interpretation   Date/Time:  Wednesday May 29 2014 23:01:27 EDT Ventricular Rate:  96 PR Interval:  201 QRS Duration: 118 QT Interval:  414 QTC Calculation: 523 R Axis:   78 Text Interpretation:  Sinus rhythm Borderline prolonged PR interval Left  ventricular hypertrophy Anterior Q waves, possibly due to LVH Nonspecific  T abnormalities, inferior leads Prolonged QT interval T wave changes in  different distribution compared to Jan 2016 Confirmed by Regenia Skeeter  MD,  [place] [phone]) on 05/29/2014 11:24:23 PM      EKG #2  EKG Interpretation  Date/Time:  Thursday May 30 2014 00:34:48 EDT Ventricular Rate:  97 PR Interval:  155 QRS Duration: 122 QT Interval:  508 QTC Calculation: 645 R Axis:   84 Text Interpretation:  Sinus rhythm Left atrial enlargement Left bundle branch block no significant change since earlier in the day Confirmed by Armanie Martine  MD, Masaki Rothbauer ([id number]) on 05/30/2014 1:06:58 AM       MDM   Final diagnoses:  Melena    Patient with melena and mild diffuse abdominal pain. No significant tenderness on my exam. EKG shows new T wave inversions with LBBB compared to January in setting of dizziness. Troponin mildly elevated, no chest pain. Repeat EKG unchanged. Not amenable to aspirin or heparin given GI bleed, and no currently in pain or with chest pain. Will need serial hemoglobins and troponins with close monitoring, will admit to hospitalist.    Sherwood Gambler, MD 05/30/14 [phone]

## 2014-05-29 NOTE — ED Notes (Signed)
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 Ambulates with walker.

## 2014-05-30 ENCOUNTER — Encounter (HOSPITAL_COMMUNITY): Payer: Self-pay | Admitting: Internal Medicine

## 2014-05-30 DIAGNOSIS — R1084 Generalized abdominal pain: Secondary | ICD-10-CM | POA: Diagnosis not present

## 2014-05-30 DIAGNOSIS — Z7902 Long term (current) use of antithrombotics/antiplatelets: Secondary | ICD-10-CM | POA: Diagnosis not present

## 2014-05-30 DIAGNOSIS — Z66 Do not resuscitate: Secondary | ICD-10-CM | POA: Diagnosis present

## 2014-05-30 DIAGNOSIS — Z515 Encounter for palliative care: Secondary | ICD-10-CM | POA: Diagnosis not present

## 2014-05-30 DIAGNOSIS — Z951 Presence of aortocoronary bypass graft: Secondary | ICD-10-CM | POA: Diagnosis not present

## 2014-05-30 DIAGNOSIS — C787 Secondary malignant neoplasm of liver and intrahepatic bile duct: Secondary | ICD-10-CM

## 2014-05-30 DIAGNOSIS — R7989 Other specified abnormal findings of blood chemistry: Secondary | ICD-10-CM | POA: Diagnosis not present

## 2014-05-30 DIAGNOSIS — Z833 Family history of diabetes mellitus: Secondary | ICD-10-CM | POA: Diagnosis not present

## 2014-05-30 DIAGNOSIS — K921 Melena: Secondary | ICD-10-CM | POA: Diagnosis present

## 2014-05-30 DIAGNOSIS — E46 Unspecified protein-calorie malnutrition: Secondary | ICD-10-CM | POA: Diagnosis present

## 2014-05-30 DIAGNOSIS — K922 Gastrointestinal hemorrhage, unspecified: Secondary | ICD-10-CM

## 2014-05-30 DIAGNOSIS — C189 Malignant neoplasm of colon, unspecified: Secondary | ICD-10-CM | POA: Diagnosis present

## 2014-05-30 DIAGNOSIS — K5900 Constipation, unspecified: Secondary | ICD-10-CM | POA: Diagnosis present

## 2014-05-30 DIAGNOSIS — Z9221 Personal history of antineoplastic chemotherapy: Secondary | ICD-10-CM | POA: Diagnosis not present

## 2014-05-30 DIAGNOSIS — R630 Anorexia: Secondary | ICD-10-CM | POA: Diagnosis not present

## 2014-05-30 DIAGNOSIS — Z823 Family history of stroke: Secondary | ICD-10-CM | POA: Diagnosis not present

## 2014-05-30 DIAGNOSIS — R188 Other ascites: Secondary | ICD-10-CM | POA: Diagnosis present

## 2014-05-30 DIAGNOSIS — R778 Other specified abnormalities of plasma proteins: Secondary | ICD-10-CM | POA: Diagnosis present

## 2014-05-30 DIAGNOSIS — Z8673 Personal history of transient ischemic attack (TIA), and cerebral infarction without residual deficits: Secondary | ICD-10-CM | POA: Diagnosis not present

## 2014-05-30 DIAGNOSIS — Z8249 Family history of ischemic heart disease and other diseases of the circulatory system: Secondary | ICD-10-CM | POA: Diagnosis not present

## 2014-05-30 DIAGNOSIS — I10 Essential (primary) hypertension: Secondary | ICD-10-CM | POA: Diagnosis present

## 2014-05-30 LAB — CBC WITH DIFFERENTIAL/PLATELET
Basophils Absolute: 0 10*3/uL (ref 0.0–0.1)
Basophils Relative: 0 % (ref 0–1)
EOS PCT: 0 % (ref 0–5)
Eosinophils Absolute: 0 10*3/uL (ref 0.0–0.7)
HCT: 35 % — ABNORMAL LOW (ref 39.0–52.0)
Hemoglobin: 11.6 g/dL — ABNORMAL LOW (ref 13.0–17.0)
LYMPHS PCT: 10 % — AB (ref 12–46)
Lymphs Abs: 2.1 10*3/uL (ref 0.7–4.0)
MCH: 28.6 pg (ref 26.0–34.0)
MCHC: 33.1 g/dL (ref 30.0–36.0)
MCV: 86.4 fL (ref 78.0–100.0)
MONO ABS: 2.5 10*3/uL — AB (ref 0.1–1.0)
MONOS PCT: 12 % (ref 3–12)
Neutro Abs: 16.1 10*3/uL — ABNORMAL HIGH (ref 1.7–7.7)
Neutrophils Relative %: 78 % — ABNORMAL HIGH (ref 43–77)
PLATELETS: 368 10*3/uL (ref 150–400)
RBC: 4.05 MIL/uL — AB (ref 4.22–5.81)
RDW: 20.4 % — ABNORMAL HIGH (ref 11.5–15.5)
WBC: 20.7 10*3/uL — AB (ref 4.0–10.5)

## 2014-05-30 LAB — COMPREHENSIVE METABOLIC PANEL
ALT: 34 U/L (ref 0–53)
ANION GAP: 14 (ref 5–15)
AST: 129 U/L — ABNORMAL HIGH (ref 0–37)
Albumin: 2.5 g/dL — ABNORMAL LOW (ref 3.5–5.2)
Alkaline Phosphatase: 625 U/L — ABNORMAL HIGH (ref 39–117)
BUN: 31 mg/dL — AB (ref 6–23)
CO2: 23 mmol/L (ref 19–32)
CREATININE: 1.38 mg/dL — AB (ref 0.50–1.35)
Calcium: 9 mg/dL (ref 8.4–10.5)
Chloride: 97 mmol/L (ref 96–112)
GFR calc Af Amer: 63 mL/min — ABNORMAL LOW (ref >90)
GFR calc non Af Amer: 54 mL/min — ABNORMAL LOW (ref >90)
GLUCOSE: 86 mg/dL (ref 70–99)
POTASSIUM: 3.8 mmol/L (ref 3.5–5.1)
Sodium: 134 mmol/L — ABNORMAL LOW (ref 135–145)
TOTAL PROTEIN: 7.3 g/dL (ref 6.0–8.3)
Total Bilirubin: 2.1 mg/dL — ABNORMAL HIGH (ref 0.3–1.2)

## 2014-05-30 LAB — PROTIME-INR
INR: 1.19 (ref 0.00–1.49)
Prothrombin Time: 15.2 seconds (ref 11.6–15.2)

## 2014-05-30 LAB — MAGNESIUM: MAGNESIUM: 2 mg/dL (ref 1.5–2.5)

## 2014-05-30 LAB — CBC
HCT: 34.3 % — ABNORMAL LOW (ref 39.0–52.0)
Hemoglobin: 11.5 g/dL — ABNORMAL LOW (ref 13.0–17.0)
MCH: 29 pg (ref 26.0–34.0)
MCHC: 33.5 g/dL (ref 30.0–36.0)
MCV: 86.4 fL (ref 78.0–100.0)
Platelets: 356 10*3/uL (ref 150–400)
RBC: 3.97 MIL/uL — ABNORMAL LOW (ref 4.22–5.81)
RDW: 20.7 % — AB (ref 11.5–15.5)
WBC: 20.3 10*3/uL — AB (ref 4.0–10.5)

## 2014-05-30 LAB — URINALYSIS, ROUTINE W REFLEX MICROSCOPIC
Glucose, UA: NEGATIVE mg/dL
Hgb urine dipstick: NEGATIVE
Ketones, ur: NEGATIVE mg/dL
LEUKOCYTES UA: NEGATIVE
NITRITE: NEGATIVE
Protein, ur: 30 mg/dL — AB
SPECIFIC GRAVITY, URINE: 1.021 (ref 1.005–1.030)
UROBILINOGEN UA: 1 mg/dL (ref 0.0–1.0)
pH: 5 (ref 5.0–8.0)

## 2014-05-30 LAB — AMMONIA: Ammonia: 30 umol/L (ref 11–32)

## 2014-05-30 LAB — PHOSPHORUS: Phosphorus: 4.7 mg/dL — ABNORMAL HIGH (ref 2.3–4.6)

## 2014-05-30 LAB — CLOSTRIDIUM DIFFICILE BY PCR: Toxigenic C. Difficile by PCR: NEGATIVE

## 2014-05-30 LAB — URINE MICROSCOPIC-ADD ON

## 2014-05-30 LAB — TROPONIN I: TROPONIN I: 0.08 ng/mL — AB (ref <0.031)

## 2014-05-30 LAB — TSH: TSH: 4.049 u[IU]/mL (ref 0.350–4.500)

## 2014-05-30 MED ORDER — SACCHAROMYCES BOULARDII 250 MG PO CAPS
250.0000 mg | ORAL_CAPSULE | Freq: Two times a day (BID) | ORAL | Status: DC
  Administered 2014-05-30 – 2014-06-01 (×5): 250 mg via ORAL
  Filled 2014-05-30 (×6): qty 1

## 2014-05-30 MED ORDER — ONDANSETRON HCL 4 MG/2ML IJ SOLN: 4.0000 mg | Freq: Four times a day (QID) | INTRAMUSCULAR | Status: DC | PRN

## 2014-05-30 MED ORDER — ISOSORBIDE DINITRATE 20 MG PO TABS
40.0000 mg | ORAL_TABLET | Freq: Every day | ORAL | Status: DC
  Administered 2014-05-30 – 2014-06-01 (×3): 40 mg via ORAL
  Filled 2014-05-30 (×3): qty 2

## 2014-05-30 MED ORDER — ONDANSETRON HCL 4 MG PO TABS: 4.0000 mg | ORAL_TABLET | Freq: Four times a day (QID) | ORAL | Status: DC | PRN

## 2014-05-30 MED ORDER — ACETAMINOPHEN 325 MG PO TABS: 650.0000 mg | ORAL_TABLET | Freq: Four times a day (QID) | ORAL | Status: DC | PRN

## 2014-05-30 MED ORDER — CETYLPYRIDINIUM CHLORIDE 0.05 % MT LIQD
7.0000 mL | Freq: Two times a day (BID) | OROMUCOSAL | Status: DC
  Administered 2014-05-30 – 2014-06-01 (×5): 7 mL via OROMUCOSAL

## 2014-05-30 MED ORDER — DRONABINOL 5 MG PO CAPS: 5.0000 mg | ORAL_CAPSULE | Freq: Three times a day (TID) | ORAL | Status: DC

## 2014-05-30 MED ORDER — PRAVASTATIN SODIUM 20 MG PO TABS
20.0000 mg | ORAL_TABLET | Freq: Every day | ORAL | Status: DC
  Administered 2014-05-30: 20 mg via ORAL
  Filled 2014-05-30 (×2): qty 1

## 2014-05-30 MED ORDER — AMLODIPINE BESYLATE 5 MG PO TABS
5.0000 mg | ORAL_TABLET | Freq: Every day | ORAL | Status: DC
Start: 2014-05-30 — End: 2014-06-01
  Administered 2014-05-30 – 2014-06-01 (×3): 5 mg via ORAL
  Filled 2014-05-30 (×3): qty 1

## 2014-05-30 MED ORDER — BOOST / RESOURCE BREEZE PO LIQD
1.0000 | Freq: Three times a day (TID) | ORAL | Status: DC
  Administered 2014-05-30: 1 via ORAL

## 2014-05-30 MED ORDER — OXYCODONE HCL 5 MG PO TABS
5.0000 mg | ORAL_TABLET | ORAL | Status: DC | PRN
  Administered 2014-05-30 – 2014-06-01 (×5): 5 mg via ORAL
  Filled 2014-05-30 (×5): qty 1

## 2014-05-30 MED ORDER — BISMUTH SUBSALICYLATE 262 MG/15ML PO SUSP
30.0000 mL | Freq: Three times a day (TID) | ORAL | Status: DC
  Administered 2014-05-30 (×2): 30 mL via ORAL
  Filled 2014-05-30: qty 118

## 2014-05-30 MED ORDER — PANTOPRAZOLE SODIUM 40 MG PO TBEC
40.0000 mg | DELAYED_RELEASE_TABLET | Freq: Every day | ORAL | Status: DC
  Administered 2014-05-30 – 2014-06-01 (×3): 40 mg via ORAL
  Filled 2014-05-30 (×3): qty 1

## 2014-05-30 MED ORDER — TRAZODONE HCL 50 MG PO TABS
50.0000 mg | ORAL_TABLET | Freq: Every evening | ORAL | Status: DC | PRN
Start: 2014-05-30 — End: 2014-05-30

## 2014-05-30 MED ORDER — SODIUM CHLORIDE 0.9 % IV SOLN
INTRAVENOUS | Status: AC
  Administered 2014-05-30: 04:00:00 via INTRAVENOUS

## 2014-05-30 MED ORDER — ACETAMINOPHEN 650 MG RE SUPP: 650.0000 mg | Freq: Four times a day (QID) | RECTAL | Status: DC | PRN

## 2014-05-30 MED ORDER — METOPROLOL SUCCINATE ER 50 MG PO TB24
50.0000 mg | ORAL_TABLET | Freq: Every day | ORAL | Status: DC
  Administered 2014-05-30 – 2014-06-01 (×3): 50 mg via ORAL
  Filled 2014-05-30 (×3): qty 1

## 2014-05-30 NOTE — ED Notes (Signed)
Assisted patient to restroom and back to stretcher with wheelchair. Pt tolerated it fair. Pt had a medium bowel movement that had visible blood present.

## 2014-05-30 NOTE — H&P (Signed)
PCP: [facility]  Referring physician Dr. Regenia Skeeter   Chief Complaint: Blood in stool   HPI: [name] is a 59 y.o. male   has a past medical history of Hypertension; Cerebral embolism; and Malignant neoplasm of colon (12/30/2013).   Patient has known history of sigmoid mass suspicious for colon cancer metastasis to the liver. Status post chemotherapy in November per Dr. Elnoria Howard. His last admission for GI bleeding was in January 2016. When he undergone code arising procedure this hospital stay was complicated by group B strep sepsis and bacteremia. At the time of discharge his hemoglobin was 9.2. Given prior history of strokes he was restarted on baby aspirin. During admission applied to the care has been consulted at that time CODE STATUS was determined to be DO NOT RESUSCITATE The plan was for patient to move to [place] with his son. Patient states [place] was a disappointment. He states his son has abandoned him and left him on the street. He has no other relatives here in [place]. He was was hospitalized and developed a stroke. He was admitted to the hospital and started on Plavix. Patient states that the hospital bought his ticket and he flew back to [place]. He has arrived 1 week ago and shortly after started to have rectal bleeding. Patient presented to emergency department with intermittent bloody bowel movements decreased appetite and abdominal pain. He also endorses some lightheadedness denied any chest pain or shortness of breath. Patient's hemoglobin was noted to be up to 11.6 he was also noted to have elevated white cell count of 20.7 of note patient chronically has leukocytosis with a baseline about 15- 17. it was also noted to have somewhat elevated troponin at 0.08 patient has had this in the past. EKG showed left bundle branch block. Patient continued to have some bowel movements with some blood present and it during ER stay. Patient endorsing having  abdominal pain which she states happens a lot when he is having bloody bowel movements patient was self treating with ibuprofen. Patient has not been taking any of his medications except for Plavix Lipitor and lisinopril. He states he cannot remember what happened and why all his medications were discontinued.  Patient states he has been having some memory loss since his admission in [place]. Family not at bedside at this point. He states his ex-wife is taking care of him now but he has no family otherwise. Patient this point states he is interested in comfort care only he is not interested in aggressive interventions at this point. Patient reiterates he wishes to be DNR/DNI  Hospitalist was called for admission for GI bleed in the setting of colon cancer  Review of Systems:    Pertinent positives include:  melena, blood in stool,  Constitutional:  No weight loss, night sweats, Fevers, chills, fatigue, weight loss  HEENT:  No headaches, Difficulty swallowing,Tooth/dental problems,Sore throat,  No sneezing, itching, ear ache, nasal congestion, post nasal drip,  Cardio-vascular:  No chest pain, Orthopnea, PND, anasarca, dizziness, palpitations.no Bilateral lower extremity swelling  GI:  No heartburn, indigestion, abdominal pain, nausea, vomiting, diarrhea, change in bowel habits, loss of appetite,  hematemesis Resp:  no shortness of breath at rest. No dyspnea on exertion, No excess mucus, no productive cough, No non-productive cough, No coughing up of blood.No change in color of mucus.No wheezing. Skin:  no rash or lesions. No jaundice GU:  no dysuria, change in color of urine, no urgency or frequency. No straining  to urinate.  No flank pain.  Musculoskeletal:  No joint pain or no joint swelling. No decreased range of motion. No back pain.  Psych:  No change in mood or affect. No depression or anxiety. No memory loss.  Neuro: no localizing neurological complaints, no tingling, no  weakness, no double vision, no gait abnormality, no slurred speech, no confusion  Otherwise ROS are negative except for above, 10 systems were reviewed  Past Medical History: Past Medical History  Diagnosis Date  . Hypertension   . Cerebral embolism   . Malignant neoplasm of colon 12/30/2013   Past Surgical History  Procedure Laterality Date  . Coronary artery bypass graft    . Flexible sigmoidoscopy N/A 12/30/2013    Procedure: FLEXIBLE SIGMOIDOSCOPY;  Surgeon: Lafayette Dragon, MD;  [facility] END[facility];  Service: Endoscopy;  Laterality: N/A;  . Flexible sigmoidoscopy N/A 01/01/2014    Procedure: FLEXIBLE SIGMOIDOSCOPY;  Surgeon: Inda Castle, MD;  Location: [place];  Service: Endoscopy;  Laterality: N/A;  with stent placement  . Colonic stent placement N/A 01/01/2014    Procedure: COLONIC STENT PLACEMENT;  Surgeon: Inda Castle, MD;  Location: [place];  Service: Endoscopy;  Laterality: N/A;  . Portacath placement N/A 01/02/2014    Procedure: INSERTION PORT-A-CATH;  Surgeon: Ralene Ok, MD;  Location: [place];  Service: General;  Laterality: N/A;  . Flexible sigmoidoscopy N/A 02/19/2014    Procedure: FLEXIBLE SIGMOIDOSCOPY;  Surgeon: Inda Castle, MD;  Location: [facility] ENDOSCOPY;  Service: Endoscopy;  Laterality: N/A;  . Port-a-cath removal N/A 03/07/2014    Procedure: REMOVAL PORT-A-CATH;  Surgeon: Stark Klein, MD;  Location: [facility] ORS;  Service: General;  Laterality: N/A;     Medications: Prior to Admission medications   Medication Sig Start Date End Date Taking? Authorizing Provider  atorvastatin (LIPITOR) 80 MG tablet Take 80 mg by mouth at bedtime.   Yes Historical Provider, MD  clopidogrel (PLAVIX) 75 MG tablet Take 75 mg by mouth daily.   Yes Historical Provider, MD  lisinopril (PRINIVIL,ZESTRIL) 20 MG tablet Take 20 mg by mouth daily.   Yes Historical Provider, MD  amLODipine (NORVASC) 5 MG tablet Take 1 tablet (5 mg total) by mouth daily. Patient not  taking: Reported on 05/29/2014 01/14/14   Robbie Lis, MD  aspirin EC 81 MG EC tablet Take 1 tablet (81 mg total) by mouth daily. Patient not taking: Reported on 05/29/2014 03/10/14   Oswald Hillock, MD  bismuth subsalicylate (PEPTO BISMOL) 262 MG/15ML suspension Take 30 mLs by mouth 4 (four) times daily -  before meals and at bedtime. Patient not taking: Reported on 05/29/2014 01/14/14   Robbie Lis, MD  diphenoxylate-atropine (LOMOTIL) 2.5-0.025 MG per tablet Take 2 tablets by mouth 4 (four) times daily. Patient not taking: Reported on 05/29/2014 01/14/14   Robbie Lis, MD  dronabinol (MARINOL) 5 MG capsule Take 1 capsule (5 mg total) by mouth 3 (three) times daily before meals. Patient not taking: Reported on 05/29/2014 01/14/14   Robbie Lis, MD  feeding supplement, RESOURCE BREEZE, (RESOURCE BREEZE) LIQD Take 1 Container by mouth 3 (three) times daily between meals. Patient not taking: Reported on 05/29/2014 01/14/14   Robbie Lis, MD  folic acid (FOLVITE) 1 MG tablet Take 1 tablet (1 mg total) by mouth daily. Patient not taking: Reported on 05/29/2014 01/14/14   Robbie Lis, MD  HYDROcodone-acetaminophen (VICODIN) 5-500 MG per tablet Take 1 tablet by mouth every 6 (six) hours as needed  for pain. Patient not taking: Reported on 05/29/2014 03/10/14   Oswald Hillock, MD  isosorbide dinitrate (ISORDIL) 40 MG tablet Take 1 tablet (40 mg total) by mouth daily. Patient not taking: Reported on 05/29/2014 03/10/14   Oswald Hillock, MD  lidocaine (LIDODERM) 5 % Place 1 patch onto the skin daily. Remove & Discard patch within 12 hours or as directed by MD Patient not taking: Reported on 05/29/2014 03/10/14   Oswald Hillock, MD  lidocaine-prilocaine (EMLA) cream Apply 1 application topically as needed. Apply to [facility] site 1 hour prior to appointment time. Patient not taking: Reported on 05/29/2014 01/22/14   Eliezer Bottom, NP  metoprolol succinate (TOPROL-XL) 50 MG 24 hr tablet Take 1 tablet (50 mg total) by mouth  daily. Take with or immediately following a meal. Patient not taking: Reported on 05/29/2014 03/10/14   Oswald Hillock, MD  oxyCODONE (OXY IR/ROXICODONE) 5 MG immediate release tablet Take 1 tablet (5 mg total) by mouth every 4 (four) hours as needed for severe pain. Patient not taking: Reported on 05/29/2014 01/14/14   Robbie Lis, MD  pantoprazole (PROTONIX) 40 MG tablet Take 1 tablet (40 mg total) by mouth daily. Patient not taking: Reported on 05/29/2014 03/10/14   Oswald Hillock, MD  pravastatin (PRAVACHOL) 20 MG tablet Take 1 tablet (20 mg total) by mouth daily at 6 PM. Patient not taking: Reported on 05/29/2014 01/14/14   Robbie Lis, MD  saccharomyces boulardii (FLORASTOR) 250 MG capsule Take 1 capsule (250 mg total) by mouth 2 (two) times daily. Patient not taking: Reported on 05/29/2014 01/14/14   Robbie Lis, MD  traZODone (DESYREL) 50 MG tablet Take 1 tablet (50 mg total) by mouth at bedtime as needed for sleep. Patient not taking: Reported on 05/29/2014 01/14/14   Robbie Lis, MD    Allergies:   Allergies  Allergen Reactions  . Penicillins     childhood    Social History:  Ambulatory   walker   He is staying with his ex-wife now        reports that he has never smoked. He has never used smokeless tobacco. He reports that he drinks alcohol. He reports that he uses illicit drugs (Marijuana).    Family History: family history is not on file.    Physical Exam: Patient Vitals for the past 24 hrs:  BP Temp Temp src Pulse Resp SpO2  05/30/14 0035 (!) 180/112 mmHg 98.5 F (36.9 C) Oral 98 20 (!) 20 %  05/29/14 2330 (!) 161/113 mmHg - - 95 20 98 %  05/29/14 2202 (!) 150/107 mmHg - - 102 23 100 %  05/29/14 2035 (!) 126/106 mmHg 98.3 F (36.8 C) Oral 110 18 98 %    1. General:  in No Acute distress 2. Psychological: Alert and   Oriented 3. Head/ENT:     Dry Mucous Membranes                          Head Non traumatic, neck supple                          Normal   Dentition 4. SKIN:  decreased Skin turgor,  Skin clean Dry and intact no rash 5. Heart: Regular rate and rhythm no Murmur, Rub or gallop 6. Lungs: Clear to auscultation bilaterally, no wheezes or crackles   7. Abdomen: Mass palpable and epigastric  area mildly tender,   distended 8. Lower extremities: no clubbing, cyanosis, or edema 9. Neurologically Grossly intact, moving all 4 extremities equally 10. MSK: Normal range of motion  body mass index is unknown because there is no weight on file.   Labs on Admission:   Results for orders placed or performed during the hospital encounter of 05/29/14 (from the past 24 hour(s))  Comprehensive metabolic panel     Status: Abnormal   Collection Time: 05/29/14 10:56 PM  Result Value Ref Range   Sodium 134 (L) 135 - 145 mmol/L   Potassium 3.8 3.5 - 5.1 mmol/L   Chloride 97 96 - 112 mmol/L   CO2 23 19 - 32 mmol/L   Glucose, Bld 86 70 - 99 mg/dL   BUN 31 (H) 6 - 23 mg/dL   Creatinine, Ser 1.38 (H) 0.50 - 1.35 mg/dL   Calcium 9.0 8.4 - 10.5 mg/dL   Total Protein 7.3 6.0 - 8.3 g/dL   Albumin 2.5 (L) 3.5 - 5.2 g/dL   AST 129 (H) 0 - 37 U/L   ALT 34 0 - 53 U/L   Alkaline Phosphatase 625 (H) 39 - 117 U/L   Total Bilirubin 2.1 (H) 0.3 - 1.2 mg/dL   GFR calc non Af Amer 54 (L) >90 mL/min   GFR calc Af Amer 63 (L) >90 mL/min   Anion gap 14 5 - 15  CBC with Differential     Status: Abnormal   Collection Time: 05/29/14 10:56 PM  Result Value Ref Range   WBC 20.7 (H) 4.0 - 10.5 K/uL   RBC 4.05 (L) 4.22 - 5.81 MIL/uL   Hemoglobin 11.6 (L) 13.0 - 17.0 g/dL   HCT 35.0 (L) 39.0 - 52.0 %   MCV 86.4 78.0 - 100.0 fL   MCH 28.6 26.0 - 34.0 pg   MCHC 33.1 30.0 - 36.0 g/dL   RDW 20.4 (H) 11.5 - 15.5 %   Platelets 368 150 - 400 K/uL   Neutrophils Relative % 78 (H) 43 - 77 %   Lymphocytes Relative 10 (L) 12 - 46 %   Monocytes Relative 12 3 - 12 %   Eosinophils Relative 0 0 - 5 %   Basophils Relative 0 0 - 1 %   Neutro Abs 16.1 (H) 1.7 - 7.7 K/uL    Lymphs Abs 2.1 0.7 - 4.0 K/uL   Monocytes Absolute 2.5 (H) 0.1 - 1.0 K/uL   Eosinophils Absolute 0.0 0.0 - 0.7 K/uL   Basophils Absolute 0.0 0.0 - 0.1 K/uL   RBC Morphology TARGET CELLS    WBC Morphology VACUOLATED NEUTROPHILS    Smear Review LARGE PLATELETS PRESENT   Protime-INR     Status: None   Collection Time: 05/29/14 10:56 PM  Result Value Ref Range   Prothrombin Time 15.2 11.6 - 15.2 seconds   INR 1.19 0.00 - 1.49  Ammonia     Status: None   Collection Time: 05/29/14 10:56 PM  Result Value Ref Range   Ammonia 30 11 - 32 umol/L  POC occult blood, ED RN will collect     Status: Abnormal   Collection Time: 05/29/14 10:59 PM  Result Value Ref Range   Fecal Occult Bld POSITIVE (A) NEGATIVE  Troponin I     Status: Abnormal   Collection Time: 05/29/14 11:34 PM  Result Value Ref Range   Troponin I 0.08 (H) <0.031 ng/mL    UA unable to obtain  Lab Results  Component Value Date  HGBA1C 5.5 12/28/2013    CrCl cannot be calculated (Unknown ideal weight.).  BNP (last 3 results) No results for input(s): PROBNP in the last 8760 hours.  Other results:  I have pearsonaly reviewed this: ECG REPORT  Rate: 97  Rhythm: Left bundle branch block ST&T Change: n/a   There were no vitals filed for this visit.   Cultures:    Component Value Date/Time   SDES PORTA CATH PORT A CATH TUBING AND PORT 03/07/2014 1118   SDES PORTA CATH PORT A CATH TUBING AND PORT 03/07/2014 1118   [place] NONE 03/07/2014 1118   SPECREQUEST NONE 03/07/2014 1118   CULT  03/07/2014 1118    NO ANAEROBES ISOLATED Performed at [place]  03/07/2014 1118    NO GROWTH 2 DAYS Performed at [place] 03/12/2014 FINAL 03/07/2014 1118   REPTSTATUS 03/09/2014 FINAL 03/07/2014 1118     Radiological Exams on Admission: No results found.  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 Patient apparently was hospitalized in [place] and per self-reported may have had a stroke there he was started on Plavix. And then was discharged to [facility]. Patient at this point wishes to concentrate on comfort measures. He states he is not interested in aggressive interventions  Present on Admission:  . Acute GI bleeding - most likely secondary to colon cancer. We'll discontinue Plavix. At this point is no indication for transfusion. Patient will benefit from palliative care consult  . Colon cancer metastasized to liver- patient this point interested in comfort care. Could discuss with oncology in the morning if there is any role for palliative interventions  . Elevated troponin - patient denies any chest pain is at this point not interested in aggressive interventions.  . Benign essential HTN restart home medications with side effects of uncontrolled hypertension    Prophylaxis: SCD  , Protonix  CODE STATUS:   DNR/DNI as per patient  wishes to concentrate comfort care measures   Other plan as per orders.  I have spent a total of 55 min on this admission  Vin Yonke 05/30/2014, 1:37 AM  [facility]  Pager [phone]   after 2 AM please page floor coverage PA If 7AM-7PM, please contact the day team taking care of the patient  [email or web address]  Password [email or web address]

## 2014-05-30 NOTE — Progress Notes (Signed)
Subjective: Patient admitted this morning, see detailed H&P by Dr. Roel Cluck Patient came with history of blood in the stool. He has a history of colon cancer with metastasis to liver. Last chemotherapy was in November. Patient was discharged in January and he went to [place] with his son, and now says that it was a disappointment and came back to [place].  Filed Vitals:   05/30/14 0319  BP: 145/96  Pulse: 94  Temp: 97.6 F (36.4 C)  Resp: 20    Chest: Clear Bilaterally Heart : S1S2 RRR Abdomen: Soft, nontender Ext : No edema Neuro: Alert, oriented x 3  A/P  Lower GI bleed Hemoglobin is stable, the bleeding is from colon cancer. Plavix has been discontinued.  Colon cancer with metastasis Patient wants comfort measures  Will consult palliative care for goals of care   [facility] Pager- [phone]

## 2014-05-31 ENCOUNTER — Inpatient Hospital Stay (HOSPITAL_COMMUNITY): Payer: Medicaid Other

## 2014-05-31 DIAGNOSIS — R1084 Generalized abdominal pain: Secondary | ICD-10-CM

## 2014-05-31 DIAGNOSIS — Z515 Encounter for palliative care: Secondary | ICD-10-CM

## 2014-05-31 DIAGNOSIS — R109 Unspecified abdominal pain: Secondary | ICD-10-CM

## 2014-05-31 MED ORDER — DOCUSATE SODIUM 100 MG PO CAPS
100.0000 mg | ORAL_CAPSULE | Freq: Every day | ORAL | Status: DC
  Administered 2014-05-31 – 2014-06-01 (×2): 100 mg via ORAL
  Filled 2014-05-31 (×2): qty 1

## 2014-05-31 MED ORDER — POLYETHYLENE GLYCOL 3350 17 G PO PACK: 17.0000 g | PACK | Freq: Every day | ORAL | Status: DC | PRN

## 2014-05-31 MED ORDER — ATORVASTATIN CALCIUM 80 MG PO TABS
80.0000 mg | ORAL_TABLET | Freq: Every day | ORAL | Status: DC
  Administered 2014-05-31: 80 mg via ORAL
  Filled 2014-05-31 (×2): qty 1

## 2014-05-31 MED ORDER — ENSURE ENLIVE PO LIQD
237.0000 mL | Freq: Three times a day (TID) | ORAL | Status: DC
  Administered 2014-05-31 (×3): 237 mL via ORAL

## 2014-05-31 MED ORDER — BOOST / RESOURCE BREEZE PO LIQD: 1.0000 | Freq: Three times a day (TID) | ORAL | Status: DC | PRN

## 2014-05-31 NOTE — Consult Note (Signed)
Consultation Note Date: 05/31/2014   Patient Name: [name]  DOB: 12/30/1955  MRN: [id number]  Age / Sex: 59 y.o., male   PCP: No primary care provider on file. Referring Physician: Oswald Hillock, MD  Reason for Consultation: [place] Assessment and Plan Summary of Established Goals of Care and Medical Treatment Preferences    Palliative Care Discussion Held Today Contacts/Participants in Discussion: Primary Decision Maker: Self - relies on his ex-wife for decisions as well  [name] is frustrated with his situation with continued decline with stage IV colon cancer and having to rely on his ex-wife for care. He tells me that he did go to [place] as planned with his son but says "he was a disappointment" and says he found himself in the hospital and they bought him a plane ticket to return to [place]. We discussed his significant weight loss and abdominal swelling. He tells me he has not had any further treatment since prior to his admission in January 2016 - he says he would want more chemotherapy. I encouraged him to follow up with Dr. [name] but did express my concern with his weight loss, decreased appetite, and weakness if he is strong enough to tolerate further treatment. He does say that he knows his cancer is stage IV and this is incurable but he is trying to enjoy each day he is here. I believe he would greatly benefit from the support of hospice in his difficult situation - I suggested this to him. I attempted to speak with his ex-wife and left a message for her. No official decision on hospice today - this may need to be decided by him and his ex-wife and they may need to meet with Dr. [name] as well before final decision.   Code Status/Advance Care Planning: DNR - I did not discuss today  Symptom Management:   Abd pain/pressure 7/10 generalized through abd likely s/t colon cancer with liver mets: Agree with [place] to assess benefit of palliative paracentesis.  Continue oxycodone 5 mg every 4 hours prn.   Constipation: Continue colace and may add Senokot-S qhs scheduled with increasing opioid needs.   Decreased appetite/malnutrition: Ensure TID.    Psycho-social/Spiritual:   Support System: Extremely poor. He relies on his ex-wife as a necessity.   Desire for further Chaplaincy support: Yes  Prognosis: Poor, likely < 6 months.   Discharge Planning: He says he will return to live with his ex-wife. I encouraged him to consider hospice support.        Chief Complaint/History of Present Illness: 59 yo male Patient has known history of sigmoid mass suspicious for colon cancer metastasis to the liver. Status post chemotherapy in November per Dr. [name]. His last admission for GI bleeding was in January 2016 - hospital stay was complicated by group B strep sepsis and bacteremia. At the time of discharge his hemoglobin was 9.2. Given prior history of strokes he was restarted on baby aspirin. Patient states [place] was a disappointment. He states his son has abandoned him and left him on the street. He has no other relatives here in [place]. He was was hospitalized and developed a stroke. He was admitted to the hospital and started on Plavix. Patient states that the hospital bought his ticket and he flew back to [place]. He has arrived 1 week ago and shortly after started to have rectal bleeding. Patient presented to emergency department with intermittent bloody bowel movements decreased appetite and abdominal pain. He also endorses  some lightheadedness denied any chest pain or shortness of breath. Patient's hemoglobin was noted to be up to 11.6 he was also noted to have elevated white cell count of 20.7 of note patient chronically has leukocytosis with a baseline about 15- 17. it was also noted to have somewhat elevated troponin at 0.08 patient has had this in the past. EKG showed left bundle branch block.Patient endorsing having abdominal pain which she states  happens a lot when he is having bloody bowel movements patient was self treating with ibuprofen.Patient has not been taking any of his medications except for Plavix Lipitor and lisinopril. He states he cannot remember what happened and why all his medications were discontinued. Patient states he has been having some memory loss since his admission in [place]. He states his ex-wife is taking care of him now. Patient at this point states to admitting provider he is interested in comfort care only he is not interested in aggressive interventions at this point. PMH reviewed.    Primary Diagnoses  Present on Admission:  . Acute GI bleeding . Colon cancer metastasized to liver . Elevated troponin . Benign essential HTN . Lower GI bleed  Palliative Review of Systems: + abd pain, + decreased appetite, + weight loss, + poor support system. Denies nausea.   I have reviewed the medical record, interviewed the patient and family, and examined the patient. The following aspects are pertinent.  Past Medical History  Diagnosis Date  . Hypertension   . Cerebral embolism   . Malignant neoplasm of colon 12/30/2013   History   Social History  . Marital Status: Married    Spouse Name: N/A  . Number of Children: N/A  . Years of Education: N/A   Social History Main Topics  . Smoking status: Never Smoker   . Smokeless tobacco: Never Used  . Alcohol Use: Yes  . Drug Use: Yes    Special: Marijuana  . Sexual Activity: Not on file   Other Topics Concern  . None   Social History Narrative   Family History  Problem Relation Age of Onset  . Diabetes type II Father   . Stroke Father   . CAD Father    Scheduled Meds: . amLODipine  5 mg Oral Daily  . antiseptic oral rinse  7 mL Mouth Rinse BID  . feeding supplement (ENSURE ENLIVE)  237 mL Oral TID BM  . isosorbide dinitrate  40 mg Oral Daily  . metoprolol succinate  50 mg Oral Daily  . pantoprazole  40 mg Oral Daily  . pravastatin  20 mg Oral  q1800  . saccharomyces boulardii  250 mg Oral BID   Continuous Infusions:  PRN Meds:.acetaminophen **OR** acetaminophen, feeding supplement (RESOURCE BREEZE), ondansetron **OR** ondansetron (ZOFRAN) IV, oxyCODONE Medications Prior to Admission:  Prior to Admission medications   Medication Sig Start Date End Date Taking? Authorizing Provider  atorvastatin (LIPITOR) 80 MG tablet Take 80 mg by mouth at bedtime.   Yes Historical Provider, MD  clopidogrel (PLAVIX) 75 MG tablet Take 75 mg by mouth daily.   Yes Historical Provider, MD  lisinopril (PRINIVIL,ZESTRIL) 20 MG tablet Take 20 mg by mouth daily.   Yes Historical Provider, MD  amLODipine (NORVASC) 5 MG tablet Take 1 tablet (5 mg total) by mouth daily. Patient not taking: Reported on 05/29/2014 01/14/14   Robbie Lis, MD  aspirin EC 81 MG EC tablet Take 1 tablet (81 mg total) by mouth daily. Patient not taking: Reported on 05/29/2014 03/10/14  Oswald Hillock, MD  bismuth subsalicylate (PEPTO BISMOL) 262 MG/15ML suspension Take 30 mLs by mouth 4 (four) times daily -  before meals and at bedtime. Patient not taking: Reported on 05/29/2014 01/14/14   Robbie Lis, MD  diphenoxylate-atropine (LOMOTIL) 2.5-0.025 MG per tablet Take 2 tablets by mouth 4 (four) times daily. Patient not taking: Reported on 05/29/2014 01/14/14   Robbie Lis, MD  dronabinol (MARINOL) 5 MG capsule Take 1 capsule (5 mg total) by mouth 3 (three) times daily before meals. Patient not taking: Reported on 05/29/2014 01/14/14   Robbie Lis, MD  feeding supplement, RESOURCE BREEZE, (RESOURCE BREEZE) LIQD Take 1 Container by mouth 3 (three) times daily between meals. Patient not taking: Reported on 05/29/2014 01/14/14   Robbie Lis, MD  folic acid (FOLVITE) 1 MG tablet Take 1 tablet (1 mg total) by mouth daily. Patient not taking: Reported on 05/29/2014 01/14/14   Robbie Lis, MD  HYDROcodone-acetaminophen (VICODIN) 5-500 MG per tablet Take 1 tablet by mouth every 6 (six) hours as  needed for pain. Patient not taking: Reported on 05/29/2014 03/10/14   Oswald Hillock, MD  isosorbide dinitrate (ISORDIL) 40 MG tablet Take 1 tablet (40 mg total) by mouth daily. Patient not taking: Reported on 05/29/2014 03/10/14   Oswald Hillock, MD  lidocaine (LIDODERM) 5 % Place 1 patch onto the skin daily. Remove & Discard patch within 12 hours or as directed by MD Patient not taking: Reported on 05/29/2014 03/10/14   Oswald Hillock, MD  lidocaine-prilocaine (EMLA) cream Apply 1 application topically as needed. Apply to [facility] site 1 hour prior to appointment time. Patient not taking: Reported on 05/29/2014 01/22/14   Eliezer Bottom, NP  metoprolol succinate (TOPROL-XL) 50 MG 24 hr tablet Take 1 tablet (50 mg total) by mouth daily. Take with or immediately following a meal. Patient not taking: Reported on 05/29/2014 03/10/14   Oswald Hillock, MD  oxyCODONE (OXY IR/ROXICODONE) 5 MG immediate release tablet Take 1 tablet (5 mg total) by mouth every 4 (four) hours as needed for severe pain. Patient not taking: Reported on 05/29/2014 01/14/14   Robbie Lis, MD  pantoprazole (PROTONIX) 40 MG tablet Take 1 tablet (40 mg total) by mouth daily. Patient not taking: Reported on 05/29/2014 03/10/14   Oswald Hillock, MD  pravastatin (PRAVACHOL) 20 MG tablet Take 1 tablet (20 mg total) by mouth daily at 6 PM. Patient not taking: Reported on 05/29/2014 01/14/14   Robbie Lis, MD  saccharomyces boulardii (FLORASTOR) 250 MG capsule Take 1 capsule (250 mg total) by mouth 2 (two) times daily. Patient not taking: Reported on 05/29/2014 01/14/14   Robbie Lis, MD  traZODone (DESYREL) 50 MG tablet Take 1 tablet (50 mg total) by mouth at bedtime as needed for sleep. Patient not taking: Reported on 05/29/2014 01/14/14   Robbie Lis, MD   Allergies  Allergen Reactions  . Penicillins     childhood   CBC:    Component Value Date/Time   WBC 20.3* 05/30/2014 0847   WBC 12.1* 02/13/2014 0916   WBC 10.4* 01/22/2014 0848   HGB  11.5* 05/30/2014 0847   HGB 11.4* 02/13/2014 0916   HGB 11.5* 01/22/2014 0848   HCT 34.3* 05/30/2014 0847   HCT 33.5* 02/13/2014 0916   HCT 34.6* 01/22/2014 0848   PLT 356 05/30/2014 0847   PLT 295 02/13/2014 0916   PLT 350 01/22/2014 0848   MCV 86.4 05/30/2014 0847  MCV 86.6 02/13/2014 0916   MCV 86 01/22/2014 0848   NEUTROABS 16.1* 05/29/2014 2256   NEUTROABS 7.6* 02/13/2014 0916   NEUTROABS 6.1 01/22/2014 0848   LYMPHSABS 2.1 05/29/2014 2256   LYMPHSABS 1.1 02/13/2014 0916   LYMPHSABS 1.7 01/22/2014 0848   MONOABS 2.5* 05/29/2014 2256   MONOABS 3.1* 02/13/2014 0916   EOSABS 0.0 05/29/2014 2256   EOSABS 0.3 02/13/2014 0916   EOSABS 0.3 01/22/2014 0848   BASOSABS 0.0 05/29/2014 2256   BASOSABS 0.0 02/13/2014 0916   BASOSABS 0.0 01/22/2014 0848   Comprehensive Metabolic Panel:    Component Value Date/Time   NA 134* 05/29/2014 2256   NA 136 02/13/2014 0917   NA 137 01/22/2014 0848   K 3.8 05/29/2014 2256   K 3.5 02/13/2014 0917   K 3.6 01/22/2014 0848   CL 97 05/29/2014 2256   CL 96* 01/22/2014 0848   CO2 23 05/29/2014 2256   CO2 26 02/13/2014 0917   CO2 25 01/22/2014 0848   BUN 31* 05/29/2014 2256   BUN 17.3 02/13/2014 0917   BUN 14 01/22/2014 0848   CREATININE 1.38* 05/29/2014 2256   CREATININE 1.0 02/13/2014 0917   CREATININE 1.3* 01/22/2014 0848   GLUCOSE 86 05/29/2014 2256   GLUCOSE 90 02/13/2014 0917   GLUCOSE 144* 01/22/2014 0848   CALCIUM 9.0 05/29/2014 2256   CALCIUM 8.9 02/13/2014 0917   CALCIUM 8.8 01/22/2014 0848   AST 129* 05/29/2014 2256   AST 37* 02/13/2014 0917   AST 64* 01/22/2014 0848   ALT 34 05/29/2014 2256   ALT 14 02/13/2014 0917   ALT 17 01/22/2014 0848   ALKPHOS 625* 05/29/2014 2256   ALKPHOS 306* 02/13/2014 0917   ALKPHOS 261* 01/22/2014 0848   BILITOT 2.1* 05/29/2014 2256   BILITOT 0.24 02/13/2014 0917   BILITOT 0.50 01/22/2014 0848   PROT 7.3 05/29/2014 2256   PROT 7.3 02/13/2014 0917   PROT 7.5 01/22/2014 0848   ALBUMIN  2.5* 05/29/2014 2256   ALBUMIN 2.5* 02/13/2014 0917    Physical Exam: Vital Signs: BP 148/95 mmHg  Pulse 92  Temp(Src) 98.7 F (37.1 C) (Oral)  Resp 20  Ht 5\' 10"  (1.778 m)  Wt 70.5 kg (155 lb 6.8 oz)  BMI 22.30 kg/m2  SpO2 97% SpO2: SpO2: 97 % O2 Device: O2 Device: Not Delivered O2 Flow Rate:   Intake/output summary:  Intake/Output Summary (Last 24 hours) at 05/31/14 0913 Last data filed at 05/31/14 0700  Gross per 24 hour  Intake   2279 ml  Output    250 ml  Net   2029 ml   LBM:  4/22 Baseline Weight: Weight: 70.5 kg (155 lb 6.8 oz) Most recent weight: Weight: 70.5 kg (155 lb 6.8 oz)  Exam Findings: General: NAD, pleasant, cooperative HEENT: [place]/AT, no JVD, moist mucous membranes CV: RRR, S1 S2 Respiratory: No labored breathing, symmetric Abdomen: Firm, distended, tender to palpation Extremities: MAE, no edema, warm to touch Neuro/Psych: Grossly intact, alert, oriented x 3         Palliative Performance Scale: 50-60%           Additional Data Reviewed: Recent Labs     05/29/14  2256  05/30/14  0847  WBC  20.7*  20.3*  HGB  11.6*  11.5*  PLT  368  356  NA  134*   --   BUN  31*   --   CREATININE  1.38*   --      Time In: 0830  Time Out: 0930 Time Total: 65min  Greater than 50%  of this time was spent counseling and coordinating care related to the above assessment and plan.  Signed by: Pershing Proud, NP  Pershing Proud, NP  7/93/9688, 9:13 AM  Please contact Palliative Medicine Team phone at [phone] for questions and concerns.    Vinie Sill, NP Palliative Medicine Team Pager # [phone] (M-F 8a-5p) Team Phone # [phone] (Nights/Weekends)

## 2014-05-31 NOTE — Procedures (Signed)
Ultrasound-guided therapeutic paracentesis performed yielding 4 L yellow fluid. No immediate complications.

## 2014-05-31 NOTE — Care Management Note (Signed)
CARE MANAGEMENT NOTE 05/31/2014  Patient:  [name], [name]   Account Number:  [id number]  Date Initiated:  05/31/2014  Documentation initiated by:  Marney Doctor  Subjective/Objective Assessment:   59 yo admitted with acute GI bleed. Hx of colon CA with mets to liver     Action/Plan:   From home with ex wife [name]   Anticipated DC Date:  06/01/2014   Anticipated DC Plan:  [place]  CM consult      Choice offered to / List presented to:             Status of service:  In process, will continue to follow Medicare Important Message given?   (If response is "NO", the following Medicare IM given date fields will be blank) Date Medicare IM given:   Medicare IM given by:   Date Additional Medicare IM given:   Additional Medicare IM given by:    Discharge Disposition:    Per UR Regulation:  Reviewed for med. necessity/level of care/duration of stay  If discussed at [place] of Stay Meetings, dates discussed:    Comments:  05/31/14 Marney Doctor RN,BSN,NCM [phone] Spoke with pt at bedside regarding disposition plans. Pt states that he was staying with his ex wife [name] [phone]) prior to admission and he would like to go back with her if "she will have me". Per MD and palliative medicine team pt has expressed that he doesn't think he wants any more agressive treatment, and potentially would like Home Hospice. Pt asked this CM if I would call [name] to talk about DC plans. This CM called [name] who states she is willing to take him back home with her. Per [name] she works during the day but he was doing pretty well caring for himself while she was at work. [name] had many questions about cancer treatment and would like to speak to medical team about options prior to them making a decision for home with hospice vs home with home health. Dr. Darrick Meigs called and spoke with [facility] about prognosis of pt. [name] states that she and pt will discuss  situation and decide about home with hospice services vs home health with outpt oncology follow-up. CM will continue to monitor.

## 2014-05-31 NOTE — Progress Notes (Addendum)
[facility] PROGRESS NOTE  [name] MPN:[id number] DOB: November 11, 1955 DOA: 05/29/2014 PCP: No primary care provider on file.  Assessment/Plan: 1. Colon cancer- patient presented GI bleeding secondary to colon cancer, does not want any aggressive management. Palliative care was consulted for goals of care. 2. Ascites- patient has abdominal distention, will order ultrasound of the abdomen [with possible paracentesis [therapeutic] 3. Hypertension- continue metoprolol, amlodipine 4. Elevated troponin- patient had mild elevation of troponin 0.08, no further troponin was checked as patient did not want any aggressive care. Patient denies any chest pain. Continue metoprolol  Code Status: DO NOT RESUSCITATE Family Communication: *Called and discussed with wife on phone Disposition Plan: Home with hospice in next 24-48 hours   Consultants:  IR-paracentesis  Procedures:  Paracentesis  Antibiotics:  None  HPI/Subjective: 59 y.o. male has a past medical history of Hypertension; Cerebral embolism; and Malignant neoplasm of colon (12/30/2013).  Patient has known history of sigmoid mass suspicious for colon cancer metastasis to the liver. Status post chemotherapy in November per Dr. Elnoria Howard. His last admission for GI bleeding was in January 2016. When he undergone code arising procedure this hospital stay was complicated by group B strep sepsis and bacteremia. At the time of discharge his hemoglobin was 9.2. Given prior history of strokes he was restarted on baby aspirin. During admission applied to the care has been consulted at that time CODE STATUS was determined to be DO NOT RESUSCITATE The plan was for patient to move to [place] with his son. Patient states [place] was a disappointment. He states his son has abandoned him and left him on the street. He has no other relatives here in [place]. He was was hospitalized and developed a stroke. He was admitted to the hospital and started on Plavix.  Patient states that the hospital bought his ticket and he flew back to [place]. He has arrived 1 week ago and shortly after started to have rectal bleeding. Patient presented to emergency department with intermittent bloody bowel movements decreased appetite and abdominal pain. He also endorses some lightheadedness denied any chest pain or shortness of breath. Patient's hemoglobin was noted to be up to 11.6 he was also noted to have elevated white cell count of 20.7 of note patient chronically has leukocytosis with a baseline about 15- 17. it was also noted to have somewhat elevated troponin at 0.08 patient has had this in the past. EKG showed left bundle branch block. Patient continued to have some bowel movements with some blood present and it during ER stay. Patient endorsing having abdominal pain which she states happens a lot when he is having bloody bowel movements patient was self treating with ibuprofen. Patient has not been taking any of his medications except for Plavix Lipitor and lisinopril. He states he cannot remember what happened and why all his medications were discontinued. Patient states he has been having some memory loss since his admission in [place].  Patient complains of abdominal distention.   Objective: Filed Vitals:   05/31/14 1210  BP: 134/95  Pulse:   Temp:   Resp:     Intake/Output Summary (Last 24 hours) at 05/31/14 1317 Last data filed at 05/31/14 0700  Gross per 24 hour  Intake   1919 ml  Output    150 ml  Net   1769 ml   Filed Weights   05/31/14 0616  Weight: 70.5 kg (155 lb 6.8 oz)    Exam:   General:  Appears in no acute distress  Cardiovascular:  S1-S2 is regular  Respiratory: Clear to auscultation bilaterally  Abdomen: Soft, distended, nontender, no organomegaly  Musculoskeletal: *No edema noted in the lower extremities  Data Reviewed: Basic Metabolic Panel:  Recent Labs Lab 05/29/14 2256 05/30/14 0847  NA 134*  --   K 3.8  --    CL 97  --   CO2 23  --   GLUCOSE 86  --   BUN 31*  --   CREATININE 1.38*  --   CALCIUM 9.0  --   MG  --  2.0  PHOS  --  4.7*   Liver Function Tests:  Recent Labs Lab 05/29/14 2256  AST 129*  ALT 34  ALKPHOS 625*  BILITOT 2.1*  PROT 7.3  ALBUMIN 2.5*   No results for input(s): LIPASE, AMYLASE in the last 168 hours.  Recent Labs Lab 05/29/14 2256  AMMONIA 30   CBC:  Recent Labs Lab 05/29/14 2256 05/30/14 0847  WBC 20.7* 20.3*  NEUTROABS 16.1*  --   HGB 11.6* 11.5*  HCT 35.0* 34.3*  MCV 86.4 86.4  PLT 368 356   Cardiac Enzymes:  Recent Labs Lab 05/29/14 2334  TROPONINI 0.08*   BNP (last 3 results)  Recent Labs  02/21/14 1324  BNP 759.3*    ProBNP (last 3 results) No results for input(s): PROBNP in the last 8760 hours.  CBG: No results for input(s): GLUCAP in the last 168 hours.  Recent Results (from the past 240 hour(s))  Clostridium Difficile by PCR     Status: None   Collection Time: 05/30/14  1:01 PM  Result Value Ref Range Status   C difficile by pcr NEGATIVE NEGATIVE Final     Studies: No results found.  Scheduled Meds: . amLODipine  5 mg Oral Daily  . antiseptic oral rinse  7 mL Mouth Rinse BID  . atorvastatin  80 mg Oral q1800  . docusate sodium  100 mg Oral Daily  . feeding supplement (ENSURE ENLIVE)  237 mL Oral TID BM  . isosorbide dinitrate  40 mg Oral Daily  . metoprolol succinate  50 mg Oral Daily  . pantoprazole  40 mg Oral Daily  . saccharomyces boulardii  250 mg Oral BID   Continuous Infusions:   Active Problems:   Colon cancer metastasized to liver   Benign essential HTN   Acute GI bleeding   Elevated troponin   Lower GI bleed    Time spent: 25 min    [facility]  [facility] Pager [phone]*. If 7PM-7AM, please contact night-coverage at [email or web address], password [facility] 05/31/2014, 1:17 PM  LOS: 1 day

## 2014-05-31 NOTE — Progress Notes (Signed)
Nutrition Brief Note  Chart reviewed. Per MD note, pt requesting comfort care. Palliative GOC consult pending.  Continue Ensure Enlive PO at this time is 75% RD to follow up for full nutritional assessment Monday if pt not transitioned to comfort measures.   No further nutrition interventions warranted at this time.  Please re-consult as needed.   Laurette Schimke [place], [place], [place]

## 2014-06-01 DIAGNOSIS — R630 Anorexia: Secondary | ICD-10-CM

## 2014-06-01 MED ORDER — DOCUSATE SODIUM 100 MG PO CAPS: 100.0000 mg | ORAL_CAPSULE | Freq: Every day | ORAL | Status: AC

## 2014-06-01 MED ORDER — METOPROLOL TARTRATE 25 MG PO TABS: 25.0000 mg | ORAL_TABLET | Freq: Two times a day (BID) | ORAL | Status: AC

## 2014-06-01 MED ORDER — ISOSORBIDE MONONITRATE ER 30 MG PO TB24: 30.0000 mg | ORAL_TABLET | Freq: Every day | ORAL | Status: AC

## 2014-06-01 MED ORDER — LISINOPRIL 20 MG PO TABS: 20.0000 mg | ORAL_TABLET | Freq: Every day | ORAL | Status: AC

## 2014-06-01 MED ORDER — FOLIC ACID 1 MG PO TABS: 1.0000 mg | ORAL_TABLET | Freq: Every day | ORAL | Status: AC

## 2014-06-01 MED ORDER — RANITIDINE HCL 150 MG PO TABS: 150.0000 mg | ORAL_TABLET | Freq: Two times a day (BID) | ORAL | Status: AC | PRN

## 2014-06-01 MED ORDER — ASPIRIN 81 MG PO TBEC: 81.0000 mg | DELAYED_RELEASE_TABLET | Freq: Every day | ORAL | Status: AC

## 2014-06-01 MED ORDER — OXYCODONE HCL 5 MG PO TABS: 5.0000 mg | ORAL_TABLET | ORAL | Status: AC | PRN

## 2014-06-01 MED ORDER — LOVASTATIN 20 MG PO TABS: 20.0000 mg | ORAL_TABLET | Freq: Every day | ORAL | Status: AC

## 2014-06-01 NOTE — Clinical Social Work Note (Signed)
CSW received a call from RN stating that pt needed a taxi voucher home  CSW met with pt to confirm address and provided RN with voucher  .Dede Query, LCSW [facility] Clinical Social Worker - Weekend Coverage cell #: [phone]

## 2014-06-01 NOTE — Progress Notes (Signed)
Patient discharged home, all discharge medications and instructions reviewed with Coletta Memos, patients ex-wife with patients permission.  Patient to be assisted to vehicle by wheelchair.

## 2014-06-01 NOTE — Discharge Summary (Signed)
Physician Discharge Summary  [name] PRF:[id number] DOB: [birth year] DOA: 05/29/2014  PCP: No primary care provider on file.  Admit date: 05/29/2014 Discharge date: 06/01/2014  Time spent: *25 minutes  Recommendations for Outpatient Follow-up:  1. Follow up [facility] in one week 2. Hospice will follow up next week  Discharge Diagnoses:  Active Problems:   Colon cancer metastasized to liver   Benign essential HTN   Acute GI bleeding   Decreased appetite   Palliative care encounter   Elevated troponin   Lower GI bleed   Abdominal pain   Discharge Condition: Stable  Diet recommendation: Regular diet  Filed Weights   05/31/14 0616  Weight: 70.5 kg (155 lb 6.8 oz)    History of present illness:  59 y.o. male   has a past medical history of Hypertension; Cerebral embolism; and Malignant neoplasm of colon (12/30/2013).   Patient has known history of sigmoid mass suspicious for colon cancer metastasis to the liver. Status post chemotherapy in November per Dr. Elnoria Howard. His last admission for GI bleeding was in January 2016. When he undergone code arising procedure this hospital stay was complicated by group B strep sepsis and bacteremia. At the time of discharge his hemoglobin was 9.2. Given prior history of strokes he was restarted on baby aspirin. During admission applied to the care has been consulted at that time CODE STATUS was determined to be DO NOT RESUSCITATE The plan was for patient to move to [place] with his son. Patient states [place] was a disappointment. He states his son has abandoned him and left him on the street. He has no other relatives here in [place]. He was was hospitalized and developed a stroke. He was admitted to the hospital and started on Plavix. Patient states that the hospital bought his ticket and he flew back to [place]. He has arrived 1 week ago and shortly after started to have rectal bleeding. Patient presented to emergency  department with intermittent bloody bowel movements decreased appetite and abdominal pain. He also endorses some lightheadedness denied any chest pain or shortness of breath. Patient's hemoglobin was noted to be up to 11.6 he was also noted to have elevated white cell count of 20.7 of note patient chronically has leukocytosis with a baseline about 15- 17. it was also noted to have somewhat elevated troponin at 0.08 patient has had this in the past. EKG showed left bundle branch block. Patient continued to have some bowel movements with some blood present and it during ER stay. Patient endorsing having abdominal pain which she states happens a lot when he is having bloody bowel movements patient was self treating with ibuprofen. Patient has not been taking any of his medications except for Plavix Lipitor and lisinopril. He states he cannot remember what happened and why all his medications were discontinued. Patient states he has been having some memory loss since his admission in [place]. Family not at bedside at this point. He states his ex-wife is taking care of him now but he has no family otherwise. Patient this point states he is interested in comfort care only he is not interested in aggressive interventions at this point. Patient reiterates he wishes to be DNR/DNI   Hospital Course:  1. Colon cancer- patient presented GI bleeding secondary to colon cancer, did not want any aggressive management. Palliative care consult was obtained, also discussed with patient's wife in detail. Patient does not want any chemotherapy, wants to go home with hospice. At this  time patient's ex-wife has agreed to take care of the patient at home. Hospice [facility] has been consulted by the case manager, we will contact the patient on Monday. 2. Ascites- patient had abdominal distention,ultrasound of the abdomen [with possible paracentesis [therapeutic] was ordered and patient underwent paracentesis removing 4 L of fluid.  Patient feels much better after paracentesis 3. Hypertension- continue metoprolol. We'll discontinue amlodipine and start lisinopril 10 mg by mouth daily as patient cannot afford medications as he has no health insurance.  4. Elevated troponin- patient had mild elevation of troponin 0.08, no further troponin was checked as patient did not want any aggressive care. Patient denies any chest pain. Continue metoprolol. Will discontinue isosorbide dinitrate and start Imdur 30 mg by mouth daily which is generic. 5. Leukocytosis- patient had white count of 20,000, has been afebrile, vitals stable. No signs and symptoms of infection. Likely from the cancer. Patient doesn't want any aggressive management. Will not repeat CBC at this time 6. All the medications have been changed to generic. Patient does not want to take Plavix anymore, will continue with aspirin 81 mg by mouth daily for stroke prevention.  Procedures:  Paracentesis  Consultations:  Palliative care  Discharge Exam: Filed Vitals:   06/01/14 0522  BP: 117/87  Pulse: 90  Temp: 98.4 F (36.9 C)  Resp: 20    General: *Appears in no acute distress Cardiovascular:  S1-S2 is regular Respiratory:  Clear to auscultation bilaterally  Discharge Instructions   Discharge Instructions    Diet - low sodium heart healthy    Complete by:  As directed      Discharge instructions    Complete by:  As directed   Hospice will contact you on monday     Increase activity slowly    Complete by:  As directed           Current Discharge Medication List    START taking these medications   Details  docusate sodium (COLACE) 100 MG capsule Take 1 capsule (100 mg total) by mouth daily. Qty: 30 capsule, Refills: 0    lovastatin (MEVACOR) 20 MG tablet Take 1 tablet (20 mg total) by mouth at bedtime. Qty: 30 tablet, Refills: 2    metoprolol tartrate (LOPRESSOR) 25 MG tablet Take 1 tablet (25 mg total) by mouth 2 (two) times daily. Qty: 60  tablet, Refills: 2    ranitidine (ZANTAC) 150 MG tablet Take 1 tablet (150 mg total) by mouth 2 (two) times daily as needed for heartburn. Qty: 30 tablet, Refills: 0      CONTINUE these medications which have CHANGED   Details  aspirin 81 MG EC tablet Take 1 tablet (81 mg total) by mouth daily. Qty: 30 tablet, Refills: 2    folic acid (FOLVITE) 1 MG tablet Take 1 tablet (1 mg total) by mouth daily. Qty: 30 tablet, Refills: 0   Associated Diagnoses: Hepatomegalia; Malignant neoplasm of colon    lisinopril (PRINIVIL,ZESTRIL) 20 MG tablet Take 1 tablet (20 mg total) by mouth daily. Qty: 30 tablet, Refills: 2    oxyCODONE (OXY IR/ROXICODONE) 5 MG immediate release tablet Take 1 tablet (5 mg total) by mouth every 4 (four) hours as needed for severe pain. Qty: 30 tablet, Refills: 0      STOP taking these medications     atorvastatin (LIPITOR) 80 MG tablet      clopidogrel (PLAVIX) 75 MG tablet      amLODipine (NORVASC) 5 MG tablet  bismuth subsalicylate (PEPTO BISMOL) 262 MG/15ML suspension      diphenoxylate-atropine (LOMOTIL) 2.5-0.025 MG per tablet      dronabinol (MARINOL) 5 MG capsule      feeding supplement, RESOURCE BREEZE, (RESOURCE BREEZE) LIQD      HYDROcodone-acetaminophen (VICODIN) 5-500 MG per tablet      isosorbide dinitrate (ISORDIL) 40 MG tablet      lidocaine (LIDODERM) 5 %      lidocaine-prilocaine (EMLA) cream      metoprolol succinate (TOPROL-XL) 50 MG 24 hr tablet      pantoprazole (PROTONIX) 40 MG tablet      pravastatin (PRAVACHOL) 20 MG tablet      saccharomyces boulardii (FLORASTOR) 250 MG capsule      traZODone (DESYREL) 50 MG tablet        Allergies  Allergen Reactions  . Penicillins     childhood   Follow-up Information    Follow up with [facility]    . Schedule an appointment as soon as possible for a visit in 1 week.   Contact information:   [address] [place]  [id number] [phone]       The results of significant diagnostics from this hospitalization (including imaging, microbiology, ancillary and laboratory) are listed below for reference.    Significant Diagnostic Studies: US Paracentesis  06/01/2014   INDICATION: Metastatic colon cancer, ascites. Request is made for therapeutic paracentesis up to 4 liters.  EXAM: ULTRASOUND-GUIDED THERAPEUTIC PARACENTESIS  COMPARISON:  None.  MEDICATIONS: None.  COMPLICATIONS: None immediate  TECHNIQUE: Informed written consent was obtained from the patient after a discussion of the risks, benefits and alternatives to treatment. A timeout was performed prior to the initiation of the procedure.  Initial ultrasound scanning demonstrates a moderate tolarge amount of ascites within the right lower abdominal quadrant. The right lower abdomen was prepped and draped in the usual sterile fashion. 1% lidocaine was used for local anesthesia. Under direct ultrasound guidance, a 19 gauge, 10-cm, Yueh catheter was introduced. An ultrasound image was saved for documentation purposed. The paracentesis was performed. The catheter was removed and a dressing was applied. The patient tolerated the procedure well without immediate post procedural complication.  FINDINGS: A total of approximately 4 liters of yellow fluid was removed.  IMPRESSION: Successful ultrasound-guided therapeutic paracentesis yielding 4 liters of peritoneal fluid.  Read by: Rowe Robert, PA-C   Electronically Signed   By: Marybelle Killings M.D.   On: 05/31/2014 12:59    Microbiology: Recent Results (from the past 240 hour(s))  Clostridium Difficile by PCR     Status: None   Collection Time: 05/30/14  1:01 PM  Result Value Ref Range Status   C difficile by pcr NEGATIVE NEGATIVE Final     Labs: Basic Metabolic Panel:  Recent Labs Lab 05/29/14 2256 05/30/14 0847  NA 134*  --   K 3.8  --   CL 97  --   CO2 23  --   GLUCOSE 86  --   BUN 31*  --   CREATININE  1.38*  --   CALCIUM 9.0  --   MG  --  2.0  PHOS  --  4.7*   Liver Function Tests:  Recent Labs Lab 05/29/14 2256  AST 129*  ALT 34  ALKPHOS 625*  BILITOT 2.1*  PROT 7.3  ALBUMIN 2.5*   No results for input(s): LIPASE, AMYLASE in the last 168 hours.  Recent Labs Lab 05/29/14 2256  AMMONIA 30   CBC:  Recent Labs Lab 05/29/14 2256 05/30/14 0847  WBC 20.7* 20.3*  NEUTROABS 16.1*  --   HGB 11.6* 11.5*  HCT 35.0* 34.3*  MCV 86.4 86.4  PLT 368 356   Cardiac Enzymes:  Recent Labs Lab 05/29/14 2334  TROPONINI 0.08*   BNP: BNP (last 3 results)  Recent Labs  02/21/14 1324  BNP 759.3*    ProBNP (last 3 results) No results for input(s): PROBNP in the last 8760 hours.  CBG: No results for input(s): GLUCAP in the last 168 hours.     SignedEleonore Chiquito S  [facility] 06/01/2014, 1:49 PM

## 2014-06-01 NOTE — Progress Notes (Signed)
CARE MANAGEMENT NOTE 06/01/2014  Patient:  [name], [name]   Account Number:  [id number]  Date Initiated:  05/31/2014  Documentation initiated by:  Marney Doctor  Subjective/Objective Assessment:   59 yo admitted with acute GI bleed. Hx of colon CA with mets to liver     Action/Plan:   From home with ex wife [name]   Anticipated DC Date:  06/01/2014   Anticipated DC Plan:  [place]  CM consult      Choice offered to / List presented to:             Status of service:  Completed, signed off Medicare Important Message given?   (If response is "NO", the following Medicare IM given date fields will be blank) Date Medicare IM given:   Medicare IM given by:   Date Additional Medicare IM given:   Additional Medicare IM given by:    Discharge Disposition:  HOME/SELF CARE  Per UR Regulation:  Reviewed for med. necessity/level of care/duration of stay  If discussed at [place] of Stay Meetings, dates discussed:    Comments:  06/01/2014 1300 NCM spoke to ex-wife, [name]. States wants to follow up with pt's Oncologist to get information on additional treatments for Cancer. She does not want Home Hospice at this time. Will decide once they had appt with Oncologist. [name] requesting transport via taxi to home for pt. States she does not have any transportation at this time. Requested Unit RN call her to go over dc instructions for home. NCM made Unit RN aware. Contacted CSW for taxi voucher to home. NCM contacted [name] and provided info on [facility]. Instructed her to call Monday to arrange follow up appt.  Jonnie Finner RN CCM Case Mgmt phone [phone]  06/01/2014 1130 NCM spoke to pt and gave permission to speak with ex-wife, [name]. States he has RW at home. States he has not decided on Home Hospice. Request NCM speak with [name]. Pt states he has applied for his disability. He currently does not have any insurance coverage. Jonnie Finner RN CCM Case Mgmt phone [phone]  05/31/14 Marney Doctor RN,BSN,NCM [phone] Spoke with pt at bedside regarding disposition plans. Pt states that he was staying with his ex wife [name] [phone]) prior to admission and he would like to go back with her if "she will have me". Per MD and palliative medicine team pt has expressed that he doesn't think he wants any more agressive treatment, and potentially would like Home Hospice. Pt asked this CM if I would call [name] to talk about DC plans. This CM called [name] who states she is willing to take him back home with her. Per [name] she works during the day but he was doing pretty well caring for himself while she was at work. [name] had many questions about cancer treatment and would like to speak to medical team about options prior to them making a decision for home with hospice vs home with home health. Dr. Darrick Meigs called and spoke with [facility] about prognosis of pt. [name] states that she and pt will discuss situation and decide about home with hospice services vs home health with outpt oncology follow-up. CM will continue to monitor.

## 2014-06-05 ENCOUNTER — Inpatient Hospital Stay: Payer: Medicaid Other | Admitting: Family Medicine

## 2014-06-08 ENCOUNTER — Inpatient Hospital Stay (HOSPITAL_COMMUNITY)
Admission: EM | Admit: 2014-06-08 | Discharge: 2014-07-10 | DRG: 374 | Disposition: E | Payer: Medicaid Other | Attending: Internal Medicine | Admitting: Internal Medicine

## 2014-06-08 ENCOUNTER — Encounter (HOSPITAL_COMMUNITY): Payer: Self-pay

## 2014-06-08 ENCOUNTER — Emergency Department (HOSPITAL_COMMUNITY): Payer: Medicaid Other

## 2014-06-08 DIAGNOSIS — G893 Neoplasm related pain (acute) (chronic): Secondary | ICD-10-CM | POA: Diagnosis present

## 2014-06-08 DIAGNOSIS — T7401XA Adult neglect or abandonment, confirmed, initial encounter: Secondary | ICD-10-CM | POA: Diagnosis present

## 2014-06-08 DIAGNOSIS — Z951 Presence of aortocoronary bypass graft: Secondary | ICD-10-CM | POA: Diagnosis not present

## 2014-06-08 DIAGNOSIS — Y9289 Other specified places as the place of occurrence of the external cause: Secondary | ICD-10-CM

## 2014-06-08 DIAGNOSIS — G9341 Metabolic encephalopathy: Secondary | ICD-10-CM | POA: Diagnosis present

## 2014-06-08 DIAGNOSIS — J9601 Acute respiratory failure with hypoxia: Secondary | ICD-10-CM | POA: Diagnosis present

## 2014-06-08 DIAGNOSIS — R627 Adult failure to thrive: Secondary | ICD-10-CM | POA: Diagnosis present

## 2014-06-08 DIAGNOSIS — Y848 Other medical procedures as the cause of abnormal reaction of the patient, or of later complication, without mention of misadventure at the time of the procedure: Secondary | ICD-10-CM | POA: Diagnosis present

## 2014-06-08 DIAGNOSIS — R180 Malignant ascites: Secondary | ICD-10-CM | POA: Diagnosis present

## 2014-06-08 DIAGNOSIS — Z880 Allergy status to penicillin: Secondary | ICD-10-CM

## 2014-06-08 DIAGNOSIS — Z66 Do not resuscitate: Secondary | ICD-10-CM | POA: Diagnosis present

## 2014-06-08 DIAGNOSIS — Z515 Encounter for palliative care: Secondary | ICD-10-CM

## 2014-06-08 DIAGNOSIS — C189 Malignant neoplasm of colon, unspecified: Secondary | ICD-10-CM | POA: Diagnosis not present

## 2014-06-08 DIAGNOSIS — T884XXA Failed or difficult intubation, initial encounter: Secondary | ICD-10-CM | POA: Diagnosis present

## 2014-06-08 DIAGNOSIS — K7290 Hepatic failure, unspecified without coma: Secondary | ICD-10-CM | POA: Diagnosis present

## 2014-06-08 DIAGNOSIS — A419 Sepsis, unspecified organism: Secondary | ICD-10-CM | POA: Diagnosis present

## 2014-06-08 DIAGNOSIS — I10 Essential (primary) hypertension: Secondary | ICD-10-CM | POA: Diagnosis present

## 2014-06-08 DIAGNOSIS — R413 Other amnesia: Secondary | ICD-10-CM | POA: Diagnosis present

## 2014-06-08 DIAGNOSIS — N179 Acute kidney failure, unspecified: Secondary | ICD-10-CM | POA: Diagnosis present

## 2014-06-08 DIAGNOSIS — R040 Epistaxis: Secondary | ICD-10-CM | POA: Diagnosis present

## 2014-06-08 DIAGNOSIS — E872 Acidosis, unspecified: Secondary | ICD-10-CM | POA: Diagnosis present

## 2014-06-08 DIAGNOSIS — Z86718 Personal history of other venous thrombosis and embolism: Secondary | ICD-10-CM

## 2014-06-08 DIAGNOSIS — C787 Secondary malignant neoplasm of liver and intrahepatic bile duct: Secondary | ICD-10-CM | POA: Diagnosis present

## 2014-06-08 DIAGNOSIS — R188 Other ascites: Secondary | ICD-10-CM | POA: Diagnosis present

## 2014-06-08 DIAGNOSIS — R339 Retention of urine, unspecified: Secondary | ICD-10-CM | POA: Diagnosis not present

## 2014-06-08 DIAGNOSIS — Z95828 Presence of other vascular implants and grafts: Secondary | ICD-10-CM

## 2014-06-08 DIAGNOSIS — R4182 Altered mental status, unspecified: Secondary | ICD-10-CM | POA: Diagnosis present

## 2014-06-08 DIAGNOSIS — E860 Dehydration: Secondary | ICD-10-CM | POA: Diagnosis present

## 2014-06-08 DIAGNOSIS — Z9689 Presence of other specified functional implants: Secondary | ICD-10-CM | POA: Diagnosis present

## 2014-06-08 DIAGNOSIS — D72829 Elevated white blood cell count, unspecified: Secondary | ICD-10-CM | POA: Diagnosis present

## 2014-06-08 DIAGNOSIS — Z7982 Long term (current) use of aspirin: Secondary | ICD-10-CM | POA: Diagnosis not present

## 2014-06-08 DIAGNOSIS — R109 Unspecified abdominal pain: Secondary | ICD-10-CM | POA: Diagnosis present

## 2014-06-08 DIAGNOSIS — I447 Left bundle-branch block, unspecified: Secondary | ICD-10-CM | POA: Diagnosis present

## 2014-06-08 LAB — CBC WITH DIFFERENTIAL/PLATELET
BASOS PCT: 0 % (ref 0–1)
Basophils Absolute: 0 10*3/uL (ref 0.0–0.1)
EOS ABS: 0.1 10*3/uL (ref 0.0–0.7)
EOS PCT: 0 % (ref 0–5)
HEMATOCRIT: 39.2 % (ref 39.0–52.0)
HEMOGLOBIN: 13.1 g/dL (ref 13.0–17.0)
LYMPHS PCT: 11 % — AB (ref 12–46)
Lymphs Abs: 2.2 10*3/uL (ref 0.7–4.0)
MCH: 29.1 pg (ref 26.0–34.0)
MCHC: 33.4 g/dL (ref 30.0–36.0)
MCV: 87.1 fL (ref 78.0–100.0)
Monocytes Absolute: 2.7 10*3/uL — ABNORMAL HIGH (ref 0.1–1.0)
Monocytes Relative: 14 % — ABNORMAL HIGH (ref 3–12)
Neutro Abs: 15.3 10*3/uL — ABNORMAL HIGH (ref 1.7–7.7)
Neutrophils Relative %: 75 % (ref 43–77)
Platelets: 362 10*3/uL (ref 150–400)
RBC: 4.5 MIL/uL (ref 4.22–5.81)
RDW: 19 % — ABNORMAL HIGH (ref 11.5–15.5)
WBC: 20.3 10*3/uL — ABNORMAL HIGH (ref 4.0–10.5)

## 2014-06-08 LAB — COMPREHENSIVE METABOLIC PANEL
ALT: 28 U/L (ref 0–53)
AST: 103 U/L — ABNORMAL HIGH (ref 0–37)
Albumin: 2.1 g/dL — ABNORMAL LOW (ref 3.5–5.2)
Alkaline Phosphatase: 467 U/L — ABNORMAL HIGH (ref 39–117)
Anion gap: 18 — ABNORMAL HIGH (ref 5–15)
BUN: 67 mg/dL — AB (ref 6–23)
CHLORIDE: 94 mmol/L — AB (ref 96–112)
CO2: 21 mmol/L (ref 19–32)
CREATININE: 3.21 mg/dL — AB (ref 0.50–1.35)
Calcium: 8.8 mg/dL (ref 8.4–10.5)
GFR calc non Af Amer: 20 mL/min — ABNORMAL LOW (ref >90)
GFR, EST AFRICAN AMERICAN: 23 mL/min — AB (ref >90)
GLUCOSE: 112 mg/dL — AB (ref 70–99)
POTASSIUM: 5.2 mmol/L — AB (ref 3.5–5.1)
Sodium: 133 mmol/L — ABNORMAL LOW (ref 135–145)
Total Bilirubin: 1.9 mg/dL — ABNORMAL HIGH (ref 0.3–1.2)
Total Protein: 8.8 g/dL — ABNORMAL HIGH (ref 6.0–8.3)

## 2014-06-08 LAB — AMMONIA: Ammonia: 46 umol/L — ABNORMAL HIGH (ref 11–32)

## 2014-06-08 LAB — LACTIC ACID, PLASMA: Lactic Acid, Venous: 6 mmol/L (ref 0.5–2.0)

## 2014-06-08 MED ORDER — SODIUM CHLORIDE 0.9 % IV SOLN
INTRAVENOUS | Status: DC
  Administered 2014-06-08: 13:00:00 via INTRAVENOUS

## 2014-06-08 MED ORDER — DOCUSATE SODIUM 100 MG PO CAPS
100.0000 mg | ORAL_CAPSULE | Freq: Every day | ORAL | Status: DC
  Filled 2014-06-08: qty 1

## 2014-06-08 MED ORDER — DEXTROSE 5 % IV SOLN
1.0000 g | INTRAVENOUS | Status: DC
  Administered 2014-06-08 – 2014-06-10 (×3): 1 g via INTRAVENOUS
  Filled 2014-06-08 (×3): qty 10

## 2014-06-08 MED ORDER — LORAZEPAM 2 MG/ML IJ SOLN
0.5000 mg | INTRAMUSCULAR | Status: DC | PRN
Start: 2014-06-08 — End: 2014-06-10
  Administered 2014-06-08 – 2014-06-10 (×4): 0.5 mg via INTRAVENOUS
  Filled 2014-06-08 (×4): qty 1

## 2014-06-08 MED ORDER — MORPHINE SULFATE 2 MG/ML IJ SOLN
1.0000 mg | INTRAMUSCULAR | Status: DC | PRN
Start: 2014-06-08 — End: 2014-06-10
  Administered 2014-06-08 – 2014-06-09 (×4): 2 mg via INTRAVENOUS
  Filled 2014-06-08 (×4): qty 1

## 2014-06-08 MED ORDER — MORPHINE SULFATE 2 MG/ML IJ SOLN
1.0000 mg | INTRAMUSCULAR | Status: DC | PRN
  Administered 2014-06-08 (×2): 1 mg via INTRAVENOUS
  Filled 2014-06-08 (×2): qty 1

## 2014-06-08 MED ORDER — FOLIC ACID 1 MG PO TABS
1.0000 mg | ORAL_TABLET | Freq: Every day | ORAL | Status: DC
  Filled 2014-06-08: qty 1

## 2014-06-08 NOTE — ED Notes (Signed)
Blood cleaned off face and left hand.

## 2014-06-08 NOTE — ED Provider Notes (Signed)
CSN: [id number]     Arrival date & time 05/14/2014  0941 History   First MD Initiated Contact with Patient 06/07/2014 [phone]     Chief Complaint  Patient presents with  . Altered Mental Status      HPI Patient was recently released from the hospital with end-stage liver cancer and pancreatic cancer.  Was brought in by EMS as a decreased level of consciousness.  Was found at a motel in town.  Patient is a DNR/DNI as per hospital discharge summary.  Patient is awake but confused.  He is satting 100% on oxygen.  Plan at this time is to draw labs and have patient readmitted with social work involved. Past Medical History  Diagnosis Date  . Hypertension   . Cerebral embolism   . Malignant neoplasm of colon 12/30/2013   Past Surgical History  Procedure Laterality Date  . Coronary artery bypass graft    . Flexible sigmoidoscopy N/A 12/30/2013    Procedure: FLEXIBLE SIGMOIDOSCOPY;  Surgeon: Lafayette Dragon, MD;  Location: [facility] ENDOSCOPY;  Service: Endoscopy;  Laterality: N/A;  . Flexible sigmoidoscopy N/A 01/01/2014    Procedure: FLEXIBLE SIGMOIDOSCOPY;  Surgeon: Inda Castle, MD;  Location: [place];  Service: Endoscopy;  Laterality: N/A;  with stent placement  . Colonic stent placement N/A 01/01/2014    Procedure: COLONIC STENT PLACEMENT;  Surgeon: Inda Castle, MD;  Location: [place];  Service: Endoscopy;  Laterality: N/A;  . Portacath placement N/A 01/02/2014    Procedure: INSERTION PORT-A-CATH;  Surgeon: Ralene Ok, MD;  Location: [place];  Service: General;  Laterality: N/A;  . Flexible sigmoidoscopy N/A 02/19/2014    Procedure: FLEXIBLE SIGMOIDOSCOPY;  Surgeon: Inda Castle, MD;  Location: [facility] ENDOSCOPY;  Service: Endoscopy;  Laterality: N/A;  . Port-a-cath removal N/A 03/07/2014    Procedure: REMOVAL PORT-A-CATH;  Surgeon: Stark Klein, MD;  Location: [facility] ORS;  Service: General;  Laterality: N/A;   Family History  Problem Relation Age of Onset  . Diabetes type II Father   .  Stroke Father   . CAD Father    History  Substance Use Topics  . Smoking status: Never Smoker   . Smokeless tobacco: Never Used  . Alcohol Use: Yes    Review of Systems  Unable to perform ROS: Mental status change      Allergies  Penicillins  Home Medications   Prior to Admission medications   Medication Sig Start Date End Date Taking? Authorizing Provider  aspirin 81 MG EC tablet Take 1 tablet (81 mg total) by mouth daily. 06/01/14   Oswald Hillock, MD  docusate sodium (COLACE) 100 MG capsule Take 1 capsule (100 mg total) by mouth daily. 06/01/14   Oswald Hillock, MD  folic acid (FOLVITE) 1 MG tablet Take 1 tablet (1 mg total) by mouth daily. 06/01/14   Oswald Hillock, MD  isosorbide mononitrate (IMDUR) 30 MG 24 hr tablet Take 1 tablet (30 mg total) by mouth daily. 06/01/14   Oswald Hillock, MD  lisinopril (PRINIVIL,ZESTRIL) 20 MG tablet Take 1 tablet (20 mg total) by mouth daily. 06/01/14   Oswald Hillock, MD  lovastatin (MEVACOR) 20 MG tablet Take 1 tablet (20 mg total) by mouth at bedtime. 06/01/14   Oswald Hillock, MD  metoprolol tartrate (LOPRESSOR) 25 MG tablet Take 1 tablet (25 mg total) by mouth 2 (two) times daily. 06/01/14   Oswald Hillock, MD  oxyCODONE (OXY IR/ROXICODONE) 5 MG immediate release tablet Take 1  tablet (5 mg total) by mouth every 4 (four) hours as needed for severe pain. 06/01/14   Oswald Hillock, MD  ranitidine (ZANTAC) 150 MG tablet Take 1 tablet (150 mg total) by mouth 2 (two) times daily as needed for heartburn. 06/01/14   Oswald Hillock, MD   BP 101/85 mmHg  Pulse 117  Temp(Src) 98 F (36.7 C) (Oral)  Resp 23  SpO2 96% Physical Exam  Constitutional: He appears well-developed and well-nourished. He appears lethargic. He appears distressed.  HENT:  Head: Normocephalic and atraumatic.  Eyes: Pupils are equal, round, and reactive to light.  Neck: Normal range of motion.  Cardiovascular: Intact distal pulses.  Tachycardia present.   Pulmonary/Chest: No respiratory  distress.  Abdominal: Normal appearance. He exhibits distension, fluid wave, ascites and mass.  Musculoskeletal: Normal range of motion.  Neurological: He appears lethargic. No cranial nerve deficit. GCS eye subscore is 4. GCS verbal subscore is 3. GCS motor subscore is 5.  Skin: Skin is warm and dry. No rash noted.  Psychiatric: He has a normal mood and affect. His behavior is normal.  Nursing note and vitals reviewed.   ED Course  Procedures (including critical care time) CRITICAL CARE Performed by: Leonard Schwartz L Total critical care time: 30 min Critical care time was exclusive of separately billable procedures and treating other patients. Critical care was necessary to treat or prevent imminent or life-threatening deterioration. Critical care was time spent personally by me on the following activities: development of treatment plan with patient and/or surrogate as well as nursing, discussions with consultants, evaluation of patient's response to treatment, examination of patient, obtaining history from patient or surrogate, ordering and performing treatments and interventions, ordering and review of laboratory studies, ordering and review of radiographic studies, pulse oximetry and re-evaluation of patient's condition. 30  Labs Review Labs Reviewed  COMPREHENSIVE METABOLIC PANEL - Abnormal; Notable for the following:    Sodium 133 (*)    Potassium 5.2 (*)    Chloride 94 (*)    Glucose, Bld 112 (*)    BUN 67 (*)    Creatinine, Ser 3.21 (*)    Total Protein 8.8 (*)    Albumin 2.1 (*)    AST 103 (*)    Alkaline Phosphatase 467 (*)    Total Bilirubin 1.9 (*)    GFR calc non Af Amer 20 (*)    GFR calc Af Amer 23 (*)    Anion gap 18 (*)    All other components within normal limits  CBC WITH DIFFERENTIAL/PLATELET - Abnormal; Notable for the following:    WBC 20.3 (*)    RDW 19.0 (*)    Neutro Abs 15.3 (*)    Lymphocytes Relative 11 (*)    Monocytes Relative 14 (*)    Monocytes  Absolute 2.7 (*)    All other components within normal limits  LACTIC ACID, PLASMA - Abnormal; Notable for the following:    Lactic Acid, Venous 6.0 (*)    All other components within normal limits  AMMONIA - Abnormal; Notable for the following:    Ammonia 46 (*)    All other components within normal limits    Imaging Review Dg Chest Portable 1 View  06/02/2014   CLINICAL DATA:  Altered level of consciousness.  EXAM: PORTABLE CHEST - 1 VIEW  COMPARISON:  Chest radiograph and chest CT February 21, 2014  FINDINGS: There is no demonstrable edema or consolidation. 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 There is an aortic stent graft through this region, unchanged from prior study. Heart size is within normal limits. Patient is status post coronary artery bypass grafting. No adenopathy appreciable.  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 No edema or consolidation.   Electronically Signed   By: Lowella Grip III M.D.   On: 06/07/2014 10:40     EKG Interpretation None     I spoke with social services and the hospitalist service.  Will be admitted for hospice placement. MDM   Final diagnoses:  None        Leonard Schwartz, MD 05/28/2014 1223

## 2014-06-08 NOTE — ED Notes (Signed)
Pt and family made aware of bed assignment 

## 2014-06-08 NOTE — Consult Note (Signed)
Patient XE:NMMH Dimond      DOB: 06-Feb-1956      WKG:[id number]     Consult Note from the Palliative Medicine Team at [facility] Requested by: Erin Hearing NP     PCP: No primary care provider on file. Reason for Consultation: [place]     Phone Number:None  Assessment/Recommendations: 59 yo male with metastatic colon CA admitted with AMS, sepsis, AKI, elevated lactate, hypotension.    1.  Code Status: DNR documented  2. GOC: Mr Fetch too encephalopathic to participate in conversation.  I spoke with primary team who had extensive discussion with his ex-wife [name]. As documented in [facility] note from last admission, she is his main support system and seems to have poor relationship with son in [place].  Freddie Breech has requested Oncology input which primary team has requested. She also seems to acknowledge that he is likely transitioning toward active dying process.  I recommended that our focus really be on Mr Percle's comfort, and I think she agrees with this as well. Case management already notified in ED and reportedly working towards residential hospice which I think is appropriate.  We will have to see how he is doing tomorrow. I would suspect a residential hospice would not take antibiotics so we will have to consider what benefit this is really providing tomorrow.   3. Symptom Management:   1. Cancer Related Pain- Was on oxycodone 5mg  PRN. Switched to IV morphine 1mg  here. I will increase to 2mg  which will reflect more his home dose.  Would be reasonable to rotate to dilaudid or fentanyl if needing more frequent dosing given his renal insufficiency.   2. Ascites- likely malignant though I dont see any cytology sent.  Had 4L paracentesis last week and quite distended again.  Para would almost certainly make renal insufficiency worse but may be option to provide comfort based on how he is doing over next 24h.     Brief HPI: 59 yo male with PMHx colon CA with liver  metastasis known to our service from last admission where he saw Vinie Sill from our service. Please see her note for more detailed oncologic history and details about social situation.  He was supposed to discharge home with hospice care. He was reportedly found altered today by his ex-wife (whom is his only family/caregiver locally) and EMS was called.  They tried unsuccessfully to pursue NT intubation.  On arrival here, in hypoxic resp failure, AKI, elevated lactate, hypotension. Abdomen distended, leukocytics. Recently had 4L paracentesis for malignant ascites.  Abdomen very distended again. Discussions between family and providers in ED lead to focus mainly on comfort and possible hospice transition if able to survive hospital stay.  Unable to speak in sentences for me today.  States yes to almost every question.  Took lots of prompting to tell me he is at [facility]. Unable to get reliable history/ROS from patient.      PMH:  Past Medical History  Diagnosis Date  . Hypertension   . Cerebral embolism   . Malignant neoplasm of colon 12/30/2013     PSH: Past Surgical History  Procedure Laterality Date  . Coronary artery bypass graft    . Flexible sigmoidoscopy N/A 12/30/2013    Procedure: FLEXIBLE SIGMOIDOSCOPY;  Surgeon: Lafayette Dragon, MD;  Location: [facility];  Service: Endoscopy;  Laterality: N/A;  . Flexible sigmoidoscopy N/A 01/01/2014    Procedure: FLEXIBLE SIGMOIDOSCOPY;  Surgeon: Inda Castle, MD;  Location:  [facility] ENDOSCOPY;  Service: Endoscopy;  Laterality: N/A;  with stent placement  . Colonic stent placement N/A 01/01/2014    Procedure: COLONIC STENT PLACEMENT;  Surgeon: Inda Castle, MD;  Location: [facility];  Service: Endoscopy;  Laterality: N/A;  . Portacath placement N/A 01/02/2014    Procedure: INSERTION PORT-A-CATH;  Surgeon: Ralene Ok, MD;  Location: [facility];  Service: General;  Laterality: N/A;  . Flexible sigmoidoscopy N/A 02/19/2014    Procedure:  FLEXIBLE SIGMOIDOSCOPY;  Surgeon: Inda Castle, MD;  Location: [facility] ENDOSCOPY;  Service: Endoscopy;  Laterality: N/A;  . Port-a-cath removal N/A 03/07/2014    Procedure: REMOVAL PORT-A-CATH;  Surgeon: Stark Klein, MD;  Location: [facility] ORS;  Service: General;  Laterality: N/A;   I have reviewed the [place] and SH and  If appropriate update it with new information. Allergies  Allergen Reactions  . Penicillins     childhood   Scheduled Meds: . cefTRIAXone (ROCEPHIN)  IV  1 g Intravenous Q24H  . docusate sodium  100 mg Oral Daily  . folic acid  1 mg Oral Daily   Continuous Infusions: . sodium chloride 10 mL/hr at 05/17/2014 1305   PRN Meds:.LORazepam, morphine injection    BP 120/83 mmHg  Pulse 109  Temp(Src) 98 F (36.7 C) (Axillary)  Resp 21  SpO2 100%   PPS: 20  No intake or output data in the 24 hours ending 05/30/2014 1907   Physical Exam:  General: Alert, NAD HEENT:  [place], mmm Neck: supple Chest:   CTAB CVS: Regular rate Abdomen:distended Ext: warm Neuro: confused, frequently repeats yes to questions but can't expand into sentences.   Labs: CBC    Component Value Date/Time   WBC 20.3* 06/04/2014 0956   WBC 12.1* 02/13/2014 0916   WBC 10.4* 01/22/2014 0848   RBC 4.50 05/24/2014 0956   RBC 3.87* 02/13/2014 0916   RBC 4.04* 01/22/2014 0848   HGB 13.1 06/01/2014 0956   HGB 11.4* 02/13/2014 0916   HGB 11.5* 01/22/2014 0848   HCT 39.2 05/19/2014 0956   HCT 33.5* 02/13/2014 0916   HCT 34.6* 01/22/2014 0848   PLT 362 05/31/2014 0956   PLT 295 02/13/2014 0916   PLT 350 01/22/2014 0848   MCV 87.1 05/18/2014 0956   MCV 86.6 02/13/2014 0916   MCV 86 01/22/2014 0848   MCH 29.1 05/22/2014 0956   MCH 29.5 02/13/2014 0916   MCH 28.5 01/22/2014 0848   MCHC 33.4 05/21/2014 0956   MCHC 34.0 02/13/2014 0916   MCHC 33.2 01/22/2014 0848   RDW 19.0* 05/31/2014 0956   RDW 19.8* 02/13/2014 0916   RDW 19.8* 01/22/2014 0848   LYMPHSABS 2.2 05/29/2014 0956   LYMPHSABS 1.1  02/13/2014 0916   LYMPHSABS 1.7 01/22/2014 0848   MONOABS 2.7* 05/18/2014 0956   MONOABS 3.1* 02/13/2014 0916   EOSABS 0.1 05/26/2014 0956   EOSABS 0.3 02/13/2014 0916   EOSABS 0.3 01/22/2014 0848   BASOSABS 0.0 06/06/2014 0956   BASOSABS 0.0 02/13/2014 0916   BASOSABS 0.0 01/22/2014 0848    BMET    Component Value Date/Time   NA 133* 05/14/2014 0956   NA 136 02/13/2014 0917   NA 137 01/22/2014 0848   K 5.2* 05/21/2014 0956   K 3.5 02/13/2014 0917   K 3.6 01/22/2014 0848   CL 94* 05/19/2014 0956   CL 96* 01/22/2014 0848   CO2 21 05/12/2014 0956   CO2 26 02/13/2014 0917   CO2 25 01/22/2014 0848   GLUCOSE 112* 05/18/2014  0956   GLUCOSE 90 02/13/2014 0917   GLUCOSE 144* 01/22/2014 0848   BUN 67* 05/16/2014 0956   BUN 17.3 02/13/2014 0917   BUN 14 01/22/2014 0848   CREATININE 3.21* 05/18/2014 0956   CREATININE 1.0 02/13/2014 0917   CREATININE 1.3* 01/22/2014 0848   CALCIUM 8.8 05/28/2014 0956   CALCIUM 8.9 02/13/2014 0917   CALCIUM 8.8 01/22/2014 0848   GFRNONAA 20* 05/30/2014 0956   GFRAA 23* 05/24/2014 0956    CMP     Component Value Date/Time   NA 133* 05/23/2014 0956   NA 136 02/13/2014 0917   NA 137 01/22/2014 0848   K 5.2* 06/02/2014 0956   K 3.5 02/13/2014 0917   K 3.6 01/22/2014 0848   CL 94* 05/27/2014 0956   CL 96* 01/22/2014 0848   CO2 21 05/12/2014 0956   CO2 26 02/13/2014 0917   CO2 25 01/22/2014 0848   GLUCOSE 112* 05/17/2014 0956   GLUCOSE 90 02/13/2014 0917   GLUCOSE 144* 01/22/2014 0848   BUN 67* 05/10/2014 0956   BUN 17.3 02/13/2014 0917   BUN 14 01/22/2014 0848   CREATININE 3.21* 06/07/2014 0956   CREATININE 1.0 02/13/2014 0917   CREATININE 1.3* 01/22/2014 0848   CALCIUM 8.8 05/25/2014 0956   CALCIUM 8.9 02/13/2014 0917   CALCIUM 8.8 01/22/2014 0848   PROT 8.8* 05/13/2014 0956   PROT 7.3 02/13/2014 0917   PROT 7.5 01/22/2014 0848   ALBUMIN 2.1* 05/10/2014 0956   ALBUMIN 2.5* 02/13/2014 0917   AST 103* 05/28/2014 0956   AST 37*  02/13/2014 0917   AST 64* 01/22/2014 0848   ALT 28 06/07/2014 0956   ALT 14 02/13/2014 0917   ALT 17 01/22/2014 0848   ALKPHOS 467* 05/14/2014 0956   ALKPHOS 306* 02/13/2014 0917   ALKPHOS 261* 01/22/2014 0848   BILITOT 1.9* 05/10/2014 0956   BILITOT 0.24 02/13/2014 0917   BILITOT 0.50 01/22/2014 0848   GFRNONAA 20* 05/21/2014 0956   GFRAA 23* 05/14/2014 0956   4/30 CXR IMPRESSION: 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 No edema or consolidation.   Total Time: 50 minutes Greater than 50%  of this time was spent counseling and coordinating care related to the above assessment and plan.  Doran Clay D.O. Palliative Medicine Team at [facility]  Pager: [phone] Team Phone: [phone]

## 2014-06-08 NOTE — ED Notes (Addendum)
Gauze packed to left nare per Dr. Audie Pinto. Pt states he is cold and given blankets.

## 2014-06-08 NOTE — Care Management (Signed)
ED CM spoke Dr. Audie Pinto concerning patient who was brought in by EMS found at a local motel, he was recently discharge home under ex-wife's care from [facility] with end stage colon and liver cancer. Reviewed patient's record, patient consulted by the palliative team on last admission.  ED evaluation patient has AMS Hospitalist consulted pending admission. Inpatient CM and CSW will be consulted for disposition plan.  Updated Dr. Audie Pinto.

## 2014-06-08 NOTE — ED Notes (Signed)
Pt pulled the NG tube out

## 2014-06-08 NOTE — Progress Notes (Signed)
Pt came to ED being manually ventilated but was pushing mask away. Pt placed on NRB with spo2 100%. Pt switched to 5L [place]. Trumpet removed from left nare. Significant bleeding noted. RN aware. EMT reported attempted nasal intubation. Pt spo2 96% on 5L . RT will continue to monitor.

## 2014-06-08 NOTE — ED Notes (Addendum)
Pt's nose still bleeding. MD made aware EMS had tried to nasally intubate pt. Gauze placed in left nare

## 2014-06-08 NOTE — ED Notes (Signed)
Dr. Beaton at the bedside. 

## 2014-06-08 NOTE — H&P (Signed)
[facility] History and Physical                                                                                    [name], is a 60 y.o. male  MRN: [id number]   DOB - October 28, 1955  Admit Date - 05/22/2014  Outpatient Primary MD for the patient is No primary care provider on file.  Referring MD: Dr. Loletta Specter  With History of -  Past Medical History  Diagnosis Date  . Hypertension   . Cerebral embolism   . Malignant neoplasm of colon 12/30/2013      Past Surgical History  Procedure Laterality Date  . Coronary artery bypass graft    . Flexible sigmoidoscopy N/A 12/30/2013    Procedure: FLEXIBLE SIGMOIDOSCOPY;  Surgeon: Lafayette Dragon, MD;  Location: [facility] ENDOSCOPY;  Service: Endoscopy;  Laterality: N/A;  . Flexible sigmoidoscopy N/A 01/01/2014    Procedure: FLEXIBLE SIGMOIDOSCOPY;  Surgeon: Inda Castle, MD;  Location: [place];  Service: Endoscopy;  Laterality: N/A;  with stent placement  . Colonic stent placement N/A 01/01/2014    Procedure: COLONIC STENT PLACEMENT;  Surgeon: Inda Castle, MD;  Location: [place];  Service: Endoscopy;  Laterality: N/A;  . Portacath placement N/A 01/02/2014    Procedure: INSERTION PORT-A-CATH;  Surgeon: Ralene Ok, MD;  Location: [place];  Service: General;  Laterality: N/A;  . Flexible sigmoidoscopy N/A 02/19/2014    Procedure: FLEXIBLE SIGMOIDOSCOPY;  Surgeon: Inda Castle, MD;  Location: [facility] ENDOSCOPY;  Service: Endoscopy;  Laterality: N/A;  . Port-a-cath removal N/A 03/07/2014    Procedure: REMOVAL PORT-A-CATH;  Surgeon: Stark Klein, MD;  Location: [facility] ORS;  Service: General;  Laterality: N/A;    in for   Chief Complaint  Patient presents with  . Altered Mental Status     HPI This is a 59 year old male patient recently discharged on 4/23 the diagnosis of end-stage colon cancer with liver metastasis. During that hospitalization he was made a DO NOT RESUSCITATE with no further plans to continue chemotherapy and  otherwise was to transition to hospice care. He was discharged home under the care of his ex-wife. Palliative care assisted the patient and family with goals of care and transitioning to hospice while in the hospital. It is noted that the patient has been experiencing memory loss for several months. It is documented in the discharge paperwork that at time of discharge the family was not at the bedside but the patient reported his ex-wife would be taking care of him but he has no family otherwise. He reaffirmed to the discharging physician he is interested in comfort care only and not instructed in aggressive interventions and reiterated desire to be DNR/DNI.  Patient was brought to the ER today by EMS after being called by the patient's wife. The patient was noted with abrupt decreased level of consciousness alone in a hotel room in [place]. He was awake and confused and unable to contribute any significant history either to the ER physician nor to myself. An attempt was made to intubate the patient in the field and he is noted to have fresh bleeding and mild epistaxis from the right  nares. He is continuing to require oxygen. The ER physician has already contacted case management in regards to finding a hospice eligible bed. No family was at the bedside during my initial conversation with the patient. During our conversation with the patient he appears to be a little bit more alert than when he presented but is confused and unable to contribute significantly to the history. He does not know how he ended up in the hotel room and he does not know where his ex-wife is. He does not recall when the last time he ate or drank. Later the patient's wife came to the bedside and multiple questions were answered. She reports that the patient appeared to be doing well other than poor oral intake and was actually ambulatory went to the bathroom this morning. Prior to her calling EMS she noticed he had become weaker and had  slumped over and therefore she called the paramedics.  Evaluation in the ER revealed an afebrile patient was tachycardic with suboptimal blood pressures between 92/72 and 101/85. He is requiring moderate nasal cannula oxygen. CBC is at baseline when compared to discharge noting patient with leukocytosis and 1300. Platelets are normal and coags are normal. Electrolyte panel reveals acute renal failure with a sodium of 133, potassium 5.2, BUN 67 and creatinine 3.21. Alkaline phosphatase is 467, AST 103, ammonia 46 and total bilirubin 1.9. Lactic acid also elevated at 6.0. Glucose was 112. Chest x-ray was unremarkable. EKG reveals sinus tachycardia with a left bundle branch block but also significant sagging of the ST segments in leads 1 to an V4 through V6 with some ST elevation in leads 3 and aVF.   Review of Systems   In addition to the HPI above,  Unable to obtain accurate review of systems given patient's altered mentation  *A full 10 point Review of Systems was done, except as stated above, all other Review of Systems were negative.  Social History History  Substance Use Topics  . Smoking status: Never Smoker   . Smokeless tobacco: Never Used  . Alcohol Use: Yes    Resides at: [facility]  Lives with: Was supposed to be discharged to the care of his ex-wife on 4/23 but was found alone today  Ambulatory status: Ambulatory on date of discharge but now appears to be nonambulatory   Family History Family History  Problem Relation Age of Onset  . Diabetes type II Father   . Stroke Father   . CAD Father      Prior to Admission medications   Medication Sig Start Date End Date Taking? Authorizing Provider  aspirin 81 MG EC tablet Take 1 tablet (81 mg total) by mouth daily. 06/01/14   Oswald Hillock, MD  docusate sodium (COLACE) 100 MG capsule Take 1 capsule (100 mg total) by mouth daily. 06/01/14   Oswald Hillock, MD  folic acid (FOLVITE) 1 MG tablet Take 1 tablet (1 mg total) by mouth  daily. 06/01/14   Oswald Hillock, MD  isosorbide mononitrate (IMDUR) 30 MG 24 hr tablet Take 1 tablet (30 mg total) by mouth daily. 06/01/14   Oswald Hillock, MD  lisinopril (PRINIVIL,ZESTRIL) 20 MG tablet Take 1 tablet (20 mg total) by mouth daily. 06/01/14   Oswald Hillock, MD  lovastatin (MEVACOR) 20 MG tablet Take 1 tablet (20 mg total) by mouth at bedtime. 06/01/14   Oswald Hillock, MD  metoprolol tartrate (LOPRESSOR) 25 MG tablet Take 1 tablet (25 mg total) by mouth  2 (two) times daily. 06/01/14   Oswald Hillock, MD  oxyCODONE (OXY IR/ROXICODONE) 5 MG immediate release tablet Take 1 tablet (5 mg total) by mouth every 4 (four) hours as needed for severe pain. 06/01/14   Oswald Hillock, MD  ranitidine (ZANTAC) 150 MG tablet Take 1 tablet (150 mg total) by mouth 2 (two) times daily as needed for heartburn. 06/01/14   Oswald Hillock, MD    Allergies  Allergen Reactions  . Penicillins     childhood    Physical Exam  Vitals  Blood pressure 101/85, pulse 117, temperature 98 F (36.7 C), temperature source Oral, resp. rate 23, SpO2 96 %.   General:  In moderate distress as evidenced by increasing abdominal pain tachycardia and progressive confusion  Psych: Flat affect, due to current altered mentation unable to obtain accurate psych evaluation  Neuro:   No focal neurological deficits, CN II through XII intact, strength and sensation not tested noted to be spontaneously moving all 4 extremities  ENT:  Ears and Eyes appear Normal, Conjunctivae clear, PER. Dry oral mucosa without erythema or exudates with marked oral debris consistent with poor oral hygiene. Fresh blood per right nare  Neck:  Supple, No lymphadenopathy appreciated  Respiratory:  Symmetrical chest wall movement, decreased air movement throughout especially to the bases and appears to be limited on a mechanical basis related to marked ascites, 6 L nasal cannula oxygen-clear to auscultation anteriorly  Cardiac:  RRR, No Murmurs, no LE edema  noted, no JVD, No carotid bruits, peripheral pulses palpable at 2+, [employer] to the touch  Abdomen:  Diminished bowel sounds, marked diffuse ascites, tight, few sleepy tender, Non distended,  No masses appreciated  Skin:  No Cyanosis, poor Skin Turgor, No Skin Rash or Bruise.  Extremities: Symmetrical without obvious trauma or injury,  no effusions.  Data Review  CBC  Recent Labs Lab 05/13/2014 0956  WBC 20.3*  HGB 13.1  HCT 39.2  PLT 362  MCV 87.1  MCH 29.1  MCHC 33.4  RDW 19.0*  LYMPHSABS 2.2  MONOABS 2.7*  EOSABS 0.1  BASOSABS 0.0    Chemistries   Recent Labs Lab 05/25/2014 0956  NA 133*  K 5.2*  CL 94*  CO2 21  GLUCOSE 112*  BUN 67*  CREATININE 3.21*  CALCIUM 8.8  AST 103*  ALT 28  ALKPHOS 467*  BILITOT 1.9*    estimated creatinine clearance is 24.7 mL/min (by C-G formula based on Cr of 3.21).  No results for input(s): TSH, T4TOTAL, T3FREE, THYROIDAB in the last 72 hours.  Invalid input(s): FREET3  Coagulation profile No results for input(s): INR, PROTIME in the last 168 hours.  No results for input(s): DDIMER in the last 72 hours.  Cardiac Enzymes No results for input(s): CKMB, TROPONINI, MYOGLOBIN in the last 168 hours.  Invalid input(s): CK  Invalid input(s): POCBNP  Urinalysis    Component Value Date/Time   COLORURINE AMBER* 05/30/2014 0027   APPEARANCEUR CLOUDY* 05/30/2014 0027   LABSPEC 1.021 05/30/2014 0027   PHURINE 5.0 05/30/2014 0027   GLUCOSEU NEGATIVE 05/30/2014 0027   HGBUR NEGATIVE 05/30/2014 0027   BILIRUBINUR MODERATE* 05/30/2014 0027   KETONESUR NEGATIVE 05/30/2014 0027   PROTEINUR 30* 05/30/2014 0027   UROBILINOGEN 1.0 05/30/2014 0027   NITRITE NEGATIVE 05/30/2014 0027   LEUKOCYTESUR NEGATIVE 05/30/2014 0027    Imaging results:   US Paracentesis  06/01/2014   INDICATION: Metastatic colon cancer, ascites. Request is made for therapeutic paracentesis up to  4 liters.  EXAM: ULTRASOUND-GUIDED THERAPEUTIC  PARACENTESIS  COMPARISON:  None.  MEDICATIONS: None.  COMPLICATIONS: None immediate  TECHNIQUE: Informed written consent was obtained from the patient after a discussion of the risks, benefits and alternatives to treatment. A timeout was performed prior to the initiation of the procedure.  Initial ultrasound scanning demonstrates a moderate tolarge amount of ascites within the right lower abdominal quadrant. The right lower abdomen was prepped and draped in the usual sterile fashion. 1% lidocaine was used for local anesthesia. Under direct ultrasound guidance, a 19 gauge, 10-cm, Yueh catheter was introduced. An ultrasound image was saved for documentation purposed. The paracentesis was performed. The catheter was removed and a dressing was applied. The patient tolerated the procedure well without immediate post procedural complication.  FINDINGS: A total of approximately 4 liters of yellow fluid was removed.  IMPRESSION: Successful ultrasound-guided therapeutic paracentesis yielding 4 liters of peritoneal fluid.  Read by: Rowe Robert, PA-C   Electronically Signed   By: Marybelle Killings M.D.   On: 05/31/2014 12:59   Dg Chest Portable 1 View  05/28/2014   CLINICAL DATA:  Altered level of consciousness.  EXAM: PORTABLE CHEST - 1 VIEW  COMPARISON:  Chest radiograph and chest CT February 21, 2014  FINDINGS: There is no demonstrable edema or consolidation. 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 There is an aortic stent graft through this region, unchanged from prior study. Heart size is within normal limits. Patient is status post coronary artery bypass grafting. No adenopathy appreciable.  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 No edema or consolidation.   Electronically Signed   By: Lowella Grip III M.D.   On: 05/10/2014 10:40     EKG: (Independently reviewed) is tachycardia with left bundle on an acute  changes that appear to be consistent with ischemia   Assessment & Plan  Active Problems:   Metabolic encephalopathy -Admit to medical floor -Etiology likely multifactorial related to hypoxemia, azotemia from acute renal failure and evolving hepatic encephalopathy -Patient was also discharged home on oxycodone so certainly humiliation of this medication could contribute to encephalopathy -No further workup indicated noting patient previously stated wish to be comfort care with no further aggressive measures -Mentation has improved somewhat with administration of oxygen and will continue this for comfort -Wife confirms failure to thrive symptoms prior to this admission    Acute respiratory failure with hypoxia -Likely related to hypoventilation from evolving dying process and patient with known metastatic cancer now presenting with profound dehydration and acute renal failure -cont oxygen for supportive care now further workup per patient request previous admission    Epistaxis -Traumatic in etiology secondary to attempts to nasally intubate in the field; EMS were not aware patient had previously declared DNR/DNI status and no goldenrod form was in the room patient was discovered and -ER physician plans to place a nasal tampon help control bleeding    Leucocytosis/Abdominal pain/ascites -We'll begin Rocephin to use as comfort measure to control pain suspect related to SBP    Lactic acidosis/Acute kidney injury -Related to volume depletion and likely lack of oral intake and setting of patient receiving antihypertensive medications including ACE inhibitor at discharge -Continue to hold offending medications and given the fact patient is actively dying will not resume these medications -Since patient previously requested no aggressive therapies will not try to reverse suspected dehydration and resulting acute renal failure -Given degree  of ascites and liver involvement from metastatic disease  suspect any attempts to treat acute renal failure with volume resuscitation would only lead to increasing ascites and would not solve the problem at hand    Liver metastases/Stage IV carcinoma of colon -Have asked palliative medicine to recontact patient and family during this admission -Plan is to pursue placement of patient in hospice appropriate facility -Ex-wife requests oncology input regarding end-of-life care-we'll add Dr. [name] to consultant list -We'll continue to pursue hospice bed placement since ex-wife confirms that she is unlikely to be able to manage the patient after discharge given the complexity of his current care    DVT Prophylaxis: None, patient actively dying  Family Communication:  No family initially at [place] initial evaluation ex wife presented to bedside -updated on findings and plan  Code Status:  DO NOT RESUSCITATE  Condition:  Actively dying  Discharge disposition: Plan is to discharge to hospice facility but suspect patient will likely not survive current admission  Time spent in minutes : 60   ELLIS,ALLISON L. ANP on 05/29/2014 at 12:30 PM  Between 7am to 7pm - Pager - [phone]  After 7pm go to [email or web address] - password [email or web address]  And look for the night coverage person covering me after hours  [facility]

## 2014-06-08 NOTE — ED Notes (Signed)
Per [facility], pt from an extended stay hotel where he was with his ex-wife. Pt was released from here on 4/23 as a DNR/DNI. Pt was apneic upon PTAR and EMS arrival. sats 70's on RA and up to 92 on BVM. Abd is distended. RR was shallow. NPA placed to left nare and NG tube placed to right nare. 18g to RAC.

## 2014-06-08 NOTE — ED Notes (Signed)
Pt switched from NRB to 5 liters [place] and NPA removed from left nare by RT. RT suctioned mouth and states there was a lot of blood in the back of his throat. Pt not talking to staff at this time. Eyes open and looking around.

## 2014-06-08 NOTE — ED Notes (Signed)
Admitting NP back at bedside to talk with family

## 2014-06-09 DIAGNOSIS — R64 Cachexia: Secondary | ICD-10-CM

## 2014-06-09 DIAGNOSIS — C801 Malignant (primary) neoplasm, unspecified: Secondary | ICD-10-CM

## 2014-06-09 DIAGNOSIS — R000 Tachycardia, unspecified: Secondary | ICD-10-CM

## 2014-06-09 DIAGNOSIS — M6259 Muscle wasting and atrophy, not elsewhere classified, multiple sites: Secondary | ICD-10-CM

## 2014-06-09 DIAGNOSIS — R4182 Altered mental status, unspecified: Secondary | ICD-10-CM

## 2014-06-09 DIAGNOSIS — C787 Secondary malignant neoplasm of liver and intrahepatic bile duct: Secondary | ICD-10-CM

## 2014-06-09 DIAGNOSIS — R160 Hepatomegaly, not elsewhere classified: Secondary | ICD-10-CM

## 2014-06-09 MED ORDER — MORPHINE SULFATE 2 MG/ML IJ SOLN
2.0000 mg | INTRAMUSCULAR | Status: DC
  Administered 2014-06-09 – 2014-06-10 (×7): 2 mg via INTRAVENOUS
  Filled 2014-06-09 (×7): qty 1

## 2014-06-09 NOTE — Consult Note (Signed)
Referral MD  Reason for Referral: Metastatic colon cancer   Chief Complaint  Patient presents with  . Altered Mental Status  : No history is given  HPI: Mr. Besecker is well known to me. He is a 59 year old [place] male. He presented with metastatic colon cancer last year. He had already received an abbreviated course of chemotherapy. He had problems getting to the office. He was supposed to go to the main [place] but, sure if he did.  He ended up down in [place]. He had a bad time in [place]. He has not cared for down there. He subsequently came back to [place]. He was hospitalized recently at [facility].  It is obvious that his cancer is progressing. His performance status is ECOG 3-4.  He now comes in with change in mental status. He really is not able to talk to me. He, according to the chart, had ascites removed. He has marked abdominal distention which I'm sure is from his malignancy.  He has lost quite a bit of weight.  He has been seen by palliative care.  He has was wanted quality of life. I think he had a tough time with chemotherapy and elected to forego any chemotherapy.  He lives with his ex-wife up here. She really is doing great job in trying to care for him.    Past Medical History  Diagnosis Date  . Hypertension   . Cerebral embolism   . Malignant neoplasm of colon 12/30/2013  :  Past Surgical History  Procedure Laterality Date  . Coronary artery bypass graft    . Flexible sigmoidoscopy N/A 12/30/2013    Procedure: FLEXIBLE SIGMOIDOSCOPY;  Surgeon: Lafayette Dragon, MD;  Location: [facility] ENDOSCOPY;  Service: Endoscopy;  Laterality: N/A;  . Flexible sigmoidoscopy N/A 01/01/2014    Procedure: FLEXIBLE SIGMOIDOSCOPY;  Surgeon: Inda Castle, MD;  Location: [place];  Service: Endoscopy;  Laterality: N/A;  with stent placement  . Colonic stent placement N/A 01/01/2014    Procedure: COLONIC STENT PLACEMENT;  Surgeon: Inda Castle, MD;  Location:  [place];  Service: Endoscopy;  Laterality: N/A;  . Portacath placement N/A 01/02/2014    Procedure: INSERTION PORT-A-CATH;  Surgeon: Ralene Ok, MD;  Location: [facility];  Service: General;  Laterality: N/A;  . Flexible sigmoidoscopy N/A 02/19/2014    Procedure: FLEXIBLE SIGMOIDOSCOPY;  Surgeon: Inda Castle, MD;  Location: [facility];  Service: Endoscopy;  Laterality: N/A;  . Port-a-cath removal N/A 03/07/2014    Procedure: REMOVAL PORT-A-CATH;  Surgeon: Stark Klein, MD;  Location: [facility];  Service: General;  Laterality: N/A;  :   Current facility-administered medications:  .  0.9 %  sodium chloride infusion, , Intravenous, Continuous, Samella Parr, NP, Last Rate: 10 mL/hr at 06/05/2014 1305 .  cefTRIAXone (ROCEPHIN) 1 g in dextrose 5 % 50 mL IVPB, 1 g, Intravenous, Q24H, Samella Parr, NP, Stopped at 05/11/2014 1335 .  docusate sodium (COLACE) capsule 100 mg, 100 mg, Oral, Daily, Samella Parr, NP, 100 mg at 06/07/2014 1547 .  folic acid (FOLVITE) tablet 1 mg, 1 mg, Oral, Daily, Samella Parr, NP, 1 mg at 06/02/2014 1547 .  LORazepam (ATIVAN) injection 0.5 mg, 0.5 mg, Intravenous, Q4H PRN, Samella Parr, NP, 0.5 mg at 05/17/2014 2044 .  morphine 2 MG/ML injection 1-2 mg, 1-2 mg, Intravenous, Q2H PRN, Darrel Reach [place], DO, 2 mg at 06/09/14 0656:  . cefTRIAXone (ROCEPHIN)  IV  1 g Intravenous Q24H  .  docusate sodium  100 mg Oral Daily  . folic acid  1 mg Oral Daily  :  Allergies  Allergen Reactions  . Penicillins     childhood  :  Family History  Problem Relation Age of Onset  . Diabetes type II Father   . Stroke Father   . CAD Father   :  History   Social History  . Marital Status: Married    Spouse Name: N/A  . Number of Children: N/A  . Years of Education: N/A   Occupational History  . Not on file.   Social History Main Topics  . Smoking status: Never Smoker   . Smokeless tobacco: Never Used  . Alcohol Use: Yes  . Drug Use: Yes    Special: Marijuana   . Sexual Activity: Not on file   Other Topics Concern  . Not on file   Social History Narrative  :  Pertinent items are noted in HPI.  Exam: Patient Vitals for the past 24 hrs:  BP Temp Temp src Pulse Resp SpO2  06/07/2014 2107 103/77 mmHg 98.4 F (36.9 C) Axillary (!) 108 (!) 24 100 %  05/29/2014 1448 120/83 mmHg 98 F (36.7 C) Axillary (!) 109 (!) 21 100 %  06/05/2014 1400 93/78 mmHg - - 110 (!) 34 100 %  05/31/2014 1345 91/77 mmHg - - 110 (!) 28 100 %  05/19/2014 1330 96/81 mmHg - - 112 (!) 33 100 %  05/13/2014 1300 92/76 mmHg - - 113 (!) 31 97 %  06/06/2014 1230 96/84 mmHg - - 116 (!) 32 97 %  05/25/2014 1129 - 98 F (36.7 C) Oral - - -  05/26/2014 1115 101/85 mmHg - - 117 23 96 %  05/28/2014 1030 92/72 mmHg - - 119 18 95 %  05/23/2014 1015 100/83 mmHg - - 118 19 95 %  05/15/2014 1000 101/75 mmHg - - (!) 122 19 93 %  06/07/2014 0954 (!) 147/116 mmHg - - (!) 136 (!) 28 100 %  05/29/2014 0943 - - - - - 92 %   Somewhat cachectic African-American male. He cannot give me any response. He has no adenopathy in his neck. He has temporal muscle wasting. He has generalized muscle wasting. His lungs are clear. Cardiac exam tachycardic and regular. Abdomen is distended. He has massive hepatomegaly. It is hard to determine if there is any fluid. Extremities shows most likely an upper and lower extremities. Neurological exam shows no focal deficits.    Recent Labs  05/20/2014 0956  WBC 20.3*  HGB 13.1  HCT 39.2  PLT 362    Recent Labs  05/14/2014 0956  NA 133*  K 5.2*  CL 94*  CO2 21  GLUCOSE 112*  BUN 67*  CREATININE 3.21*  CALCIUM 8.8    Blood smear reviewnon-  Pathology:9ssmen: [name] is a 59 year old [place] male. He has metastatic colon cancer. He is end-stage.  Unfortunately, there is not much that we can do outside a comfort care. He is in no condition all to take treatment. When he was on treatment, it was working. However, he has far exceeded the point in which anything can be done  to try to make life better for him.  I would clearly favor [facility] [facility] for his final days. I cannot imagine him surviving through the month of May.  This is very sad to see him like this. His decline has been obvious and rapid.  He is so nice. He tried as much  as he could. I feel bad that he had a very hard time down in [place].  Again, I would definitely recommend [facility] as the only option for him if he makes it through this hospital stay.  I would be very liberal with pain medication if necessary for comfort.  We will follow along and try to provide any help if possible.  Lum Keas  1 Timothy 4:17-18

## 2014-06-09 NOTE — Progress Notes (Signed)
Patient FT:DDUK Sherrow      DOB: 1955-02-12      GUR:[id number]   Palliative Medicine Team at [facility] Progress Note    Subjective: Less interactive today. Doesn't really verbalize anything to me.  Appears confused but occasionally shakes head yes/no a little.    Filed Vitals:   06/09/14 1038  BP: 136/86  Pulse: 106  Temp: 97.7 F (36.5 C)  Resp: 21   Physical exam: GEN: awake, NAD CV: Tachy ABD: distended  CBC    Component Value Date/Time   WBC 20.3* 05/16/2014 0956   WBC 12.1* 02/13/2014 0916   WBC 10.4* 01/22/2014 0848   RBC 4.50 05/21/2014 0956   RBC 3.87* 02/13/2014 0916   RBC 4.04* 01/22/2014 0848   HGB 13.1 06/06/2014 0956   HGB 11.4* 02/13/2014 0916   HGB 11.5* 01/22/2014 0848   HCT 39.2 05/25/2014 0956   HCT 33.5* 02/13/2014 0916   HCT 34.6* 01/22/2014 0848   PLT 362 05/12/2014 0956   PLT 295 02/13/2014 0916   PLT 350 01/22/2014 0848   MCV 87.1 05/22/2014 0956   MCV 86.6 02/13/2014 0916   MCV 86 01/22/2014 0848   MCH 29.1 06/03/2014 0956   MCH 29.5 02/13/2014 0916   MCH 28.5 01/22/2014 0848   MCHC 33.4 05/29/2014 0956   MCHC 34.0 02/13/2014 0916   MCHC 33.2 01/22/2014 0848   RDW 19.0* 05/19/2014 0956   RDW 19.8* 02/13/2014 0916   RDW 19.8* 01/22/2014 0848   LYMPHSABS 2.2 05/31/2014 0956   LYMPHSABS 1.1 02/13/2014 0916   LYMPHSABS 1.7 01/22/2014 0848   MONOABS 2.7* 05/10/2014 0956   MONOABS 3.1* 02/13/2014 0916   EOSABS 0.1 05/23/2014 0956   EOSABS 0.3 02/13/2014 0916   EOSABS 0.3 01/22/2014 0848   BASOSABS 0.0 05/11/2014 0956   BASOSABS 0.0 02/13/2014 0916   BASOSABS 0.0 01/22/2014 0848    CMP     Component Value Date/Time   NA 133* 05/30/2014 0956   NA 136 02/13/2014 0917   NA 137 01/22/2014 0848   K 5.2* 05/24/2014 0956   K 3.5 02/13/2014 0917   K 3.6 01/22/2014 0848   CL 94* 05/22/2014 0956   CL 96* 01/22/2014 0848   CO2 21 05/11/2014 0956   CO2 26 02/13/2014 0917   CO2 25 01/22/2014 0848   GLUCOSE 112* 06/01/2014 0956   GLUCOSE 90 02/13/2014 0917   GLUCOSE 144* 01/22/2014 0848   BUN 67* 05/10/2014 0956   BUN 17.3 02/13/2014 0917   BUN 14 01/22/2014 0848   CREATININE 3.21* 05/29/2014 0956   CREATININE 1.0 02/13/2014 0917   CREATININE 1.3* 01/22/2014 0848   CALCIUM 8.8 06/04/2014 0956   CALCIUM 8.9 02/13/2014 0917   CALCIUM 8.8 01/22/2014 0848   PROT 8.8* 05/19/2014 0956   PROT 7.3 02/13/2014 0917   PROT 7.5 01/22/2014 0848   ALBUMIN 2.1* 05/29/2014 0956   ALBUMIN 2.5* 02/13/2014 0917   AST 103* 05/24/2014 0956   AST 37* 02/13/2014 0917   AST 64* 01/22/2014 0848   ALT 28 06/07/2014 0956   ALT 14 02/13/2014 0917   ALT 17 01/22/2014 0848   ALKPHOS 467* 05/16/2014 0956   ALKPHOS 306* 02/13/2014 0917   ALKPHOS 261* 01/22/2014 0848   BILITOT 1.9* 06/01/2014 0956   BILITOT 0.24 02/13/2014 0917   BILITOT 0.50 01/22/2014 0848   GFRNONAA 20* 05/18/2014 0956   GFRAA 23* 05/15/2014 0956    Assessment and plan: 59 yo male with metastatic colon CA admitted with AMS, sepsis,  AKI, elevated lactate, hypotension.   1. Code Status: DNR documented  2. GOC: Really no significant rebound overnight with abx. In fact looks worse.  Appreciative of Dr [name] seeing patient.  I know family is grateful for this.  Mr Molinelli is more lethargic and less verbal today. Agree he is actively dying. Dr [name] spoke about residential hospice and I think this is appropriate. CM supposed to have explored. I see no notes and will consult SW to make sure this process is in place.  I called [name] who is in agreement with this. Abx will stop at d/c.   3. Symptom Management:  1. Cancer Related Pain- using fairly frequent PRN morphine. I will schedule q4h since it is difficult for him to ask for medication. Would continue to allow PRN. At this point, I do not suspect that paracentecis will add much benefit. Would advocate aggressive pain control. I would stop abx as I don't think it will add to comfort or prognosis at  this point.    Total Time: 15 minutes  Doran Clay D.O. Palliative Medicine Team at [facility]  Pager: [phone] Team Phone: [phone]

## 2014-06-09 NOTE — Progress Notes (Signed)
Clarification of H/P: Incomplete editing of H/P noted upon review and after attending signed my note. The patient and his ex-wife were residing in a local motel with their son. Pt confused and gave inaccurate history regarding social status. Ex wife states they have been living in the motel since pt discharge from this facility; she has been caring for him but his needs have steadily increased and she is now requesting Hospice services. At NO POINT was the patient left alone.  Erin Hearing, ANP

## 2014-06-09 NOTE — Progress Notes (Signed)
PROGRESS NOTE  Rube Sanchez DJM:[id number] DOB: [birth year] DOA: 05/25/2014 PCP: No primary care provider on file.  Brief history 59 year old male with history of metastatic colon cancer to the liver, GI bleed presented with acute mental status change. patient recently discharged on 4/23 the diagnosis of end-stage colon cancer with liver metastasis. During that hospitalization he was made a DO NOT RESUSCITATE with no further plans to continue chemotherapy and otherwise was to transition to hospice care. He was discharged home under the care of his ex-wife. Palliative care assisted the patient and family with goals of care and transitioning to hospice while in the hospital.  The patient was noted with abrupt decreased level of consciousness alone in a hotel room in [place]. He was awake and confused and unable to contribute any significant history.  Evaluation in the ER revealed an afebrile patient was tachycardic with suboptimal blood pressures between 92/72 and 101/85. He is requiring moderate nasal cannula oxygen. CBC is at baseline when compared to discharge noting patient with leukocytosis and 1300. Platelets are normal and coags are normal. Electrolyte panel reveals acute renal failure with a sodium of 133, potassium 5.2, BUN 67 and creatinine 3.21. Alkaline phosphatase is 467, AST 103, ammonia 46 and total bilirubin 1.9. Lactic acid also elevated at 6.0. Glucose was 112. Chest x-ray was unremarkable. EKG reveals sinus tachycardia with a left bundle branch block.  After admission, the patient was seen by medical oncology,Dr. [name], who agrees with residential hospice.  He was also seen by palliative medicine, Dr. Deitra Mayo, who helped with symptom management and facilitate transfer to present to hospice. Assessment/Plan: Metabolic encephalopathy -Admit to medical floor -Etiology likely multifactorial related to hypoxemia, azotemia from acute renal failure and evolving hepatic  encephalopathy -Patient was also discharged home on oxycodone-->could contribute to encephalopathy -However, as the patient's focus of care has been changed to that of a focus of comfort, we will treat the patient's pain with intravenous morphine -No further workup indicated noting patient previously stated wish to be comfort care with no further aggressive measures -administration of oxygen and will continue this for comfort -Ex-Wife confirms failure to thrive symptoms prior to this admission   Acute respiratory failure with hypoxia -Likely related to hypoventilation from evolving dying process and patient with known metastatic cancer now presenting with profound dehydration and acute renal failure -cont oxygen for supportive care now further workup per patient request previous admission -Will not further workup as the patient's progressive care has been changed to that of comfort   Epistaxis -Traumatic in etiology secondary to attempts to nasally intubate in the field; EMS were not aware patient had previously declared DNR/DNI status and no goldenrod form was in the room patient was discovered and -No further bleeding noted   Leucocytosis/Abdominal pain/ascites -begin Rocephin to use as comfort measure to control pain suspect related to SBP -will not continue antibiotics upon discharge to residential hospice   Lactic acidosis/Acute kidney injury -Related to volume depletion and likely lack of oral intake and setting of patient receiving antihypertensive medications including ACE inhibitor at discharge -Continue to hold offending medications and given the fact patient is actively dying will not resume these medications -Since patient previously requested no aggressive therapies will not try to reverse suspected dehydration and resulting acute renal failure -Given degree of ascites and liver involvement from metastatic disease suspect any attempts to treat acute renal failure with volume  resuscitation would only lead to increasing ascites  and would not solve the problem at hand -As patient's focus appears that on comfort, will not aggressively correct    Liver metastases/Stage IV carcinoma of colon -Have asked palliative medicine to recontact patient and family during this admission -Plan is to pursue placement of patient in hospice appropriate facility -Ex-wife requests oncology input regarding end-of-life care-we'll add Dr. [name] to consultant list -continue to pursue residential hospice bed placement since ex-wife confirms that she is unlikely to be able to manage the patient after discharge given the complexity of his current care -appreciate palliative care--Dr. Deitra Mayo     Family Communication:   No family at beside Disposition Plan:   Residential hospice when bed available          Procedures/Studies: US Paracentesis  06/01/2014   INDICATION: Metastatic colon cancer, ascites. Request is made for therapeutic paracentesis up to 4 liters.  EXAM: ULTRASOUND-GUIDED THERAPEUTIC PARACENTESIS  COMPARISON:  None.  MEDICATIONS: None.  COMPLICATIONS: None immediate  TECHNIQUE: Informed written consent was obtained from the patient after a discussion of the risks, benefits and alternatives to treatment. A timeout was performed prior to the initiation of the procedure.  Initial ultrasound scanning demonstrates a moderate tolarge amount of ascites within the right lower abdominal quadrant. The right lower abdomen was prepped and draped in the usual sterile fashion. 1% lidocaine was used for local anesthesia. Under direct ultrasound guidance, a 19 gauge, 10-cm, Yueh catheter was introduced. An ultrasound image was saved for documentation purposed. The paracentesis was performed. The catheter was removed and a dressing was applied. The patient tolerated the procedure well without immediate post procedural complication.  FINDINGS: A total of approximately 4 liters of yellow fluid  was removed.  IMPRESSION: Successful ultrasound-guided therapeutic paracentesis yielding 4 liters of peritoneal fluid.  Read by: Rowe Robert, PA-C   Electronically Signed   By: Marybelle Killings M.D.   On: 05/31/2014 12:59   Dg Chest Portable 1 View  05/24/2014   CLINICAL DATA:  Altered level of consciousness.  EXAM: PORTABLE CHEST - 1 VIEW  COMPARISON:  Chest radiograph and chest CT February 21, 2014  FINDINGS: There is no demonstrable edema or consolidation. 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 There is an aortic stent graft through this region, unchanged from prior study. Heart size is within normal limits. Patient is status post coronary artery bypass grafting. No adenopathy appreciable.  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 No edema or consolidation.   Electronically Signed   By: Lowella Grip III M.D.   On: 05/27/2014 10:40         Subjective: Patient is awake, but encephalopathic and not answer question. No reports of respiratory distress, diarrhea, uncontrolled pain.  Objective: Filed Vitals:   06/01/2014 1400 05/11/2014 1448 06/01/2014 2107 06/09/14 1038  BP: 93/78 120/83 103/77 136/86  Pulse: 110 109 108 106  Temp:  98 F (36.7 C) 98.4 F (36.9 C) 97.7 F (36.5 C)  TempSrc:  Axillary Axillary Axillary  Resp: 34 21 24 21   SpO2: 100% 100% 100% 96%   No intake or output data in the 24 hours ending 06/09/14 1810 Weight change:  Exam:   General:  Pt is awake, does not follow commands appropriately, not in acute distress  HEENT: No icterus, [place]/AT  Cardiovascular: RRR, S1/S2, no rubs, no gallops  Respiratory: Bilateral crackles. No wheeze.  Abdomen: Soft/+BS, non tender, non distended, no guarding  Extremities: No lymphangitis, No petechiae, No rashes, no synovitis  Data Reviewed: Basic Metabolic Panel:  Recent Labs Lab 05/27/2014 0956  NA 133*  K 5.2*  CL 94*   CO2 21  GLUCOSE 112*  BUN 67*  CREATININE 3.21*  CALCIUM 8.8   Liver Function Tests:  Recent Labs Lab 06/02/2014 0956  AST 103*  ALT 28  ALKPHOS 467*  BILITOT 1.9*  PROT 8.8*  ALBUMIN 2.1*   No results for input(s): LIPASE, AMYLASE in the last 168 hours.  Recent Labs Lab 05/11/2014 0956  AMMONIA 46*   CBC:  Recent Labs Lab 06/07/2014 0956  WBC 20.3*  NEUTROABS 15.3*  HGB 13.1  HCT 39.2  MCV 87.1  PLT 362   Cardiac Enzymes: No results for input(s): CKTOTAL, CKMB, CKMBINDEX, TROPONINI in the last 168 hours. BNP: Invalid input(s): POCBNP CBG: No results for input(s): GLUCAP in the last 168 hours.  No results found for this or any previous visit (from the past 240 hour(s)).   Scheduled Meds: . cefTRIAXone (ROCEPHIN)  IV  1 g Intravenous Q24H  . docusate sodium  100 mg Oral Daily  . folic acid  1 mg Oral Daily  .  morphine injection  2 mg Intravenous Q4H   Continuous Infusions: . sodium chloride 10 mL/hr at 05/19/2014 1305     Garret Teale, DO  [facility] Pager [phone]  If 7PM-7AM, please contact night-coverage [email or web address] Password [email or web address] 06/09/2014, 6:10 PM   LOS: 1 day

## 2014-06-09 DEATH — deceased

## 2014-06-10 ENCOUNTER — Inpatient Hospital Stay: Payer: Medicaid Other | Admitting: Family Medicine

## 2014-06-10 DIAGNOSIS — R339 Retention of urine, unspecified: Secondary | ICD-10-CM | POA: Insufficient documentation

## 2014-07-10 NOTE — Discharge Summary (Signed)
Death Summary  [name] YKD:[id number] DOB: 01/18/1956 DOA: Jun 26, 2014  PCP: No primary care provider on file.  Admit date: 2014/06/26 Date of Death: 06/28/14  Final Diagnoses:  Metabolic encephalopathy -Admit to medical floor -Etiology likely multifactorial related to hypoxemia, azotemia from acute renal failure and evolving hepatic encephalopathy -Patient was also discharged home on oxycodone-->could contribute to encephalopathy -However, as the patient's focus of care has been changed to that of a focus of comfort, we will treat the patient's pain with intravenous morphine -No further workup indicated noting patient previously stated wish to be comfort care with no further aggressive measures -administration of oxygen and will continue this for comfort   Acute respiratory failure with hypoxia -Likely related to hypoventilation from evolving dying process and patient with known metastatic cancer now presenting with profound dehydration and acute renal failure -cont oxygen for supportive care now further workup per patient request previous admission -Will not further workup as the patient's progressive care has been changed to that of comfort   Epistaxis -Traumatic in etiology secondary to attempts to nasally intubate in the field; EMS were not aware patient had previously declared DNR/DNI status and no goldenrod form was in the room patient was discovered and -No further bleeding noted   Leucocytosis/Abdominal pain/ascites -begin Rocephin to use as comfort measure to control pain suspect related to SBP -will not continue antibiotics upon discharge to residential hospice   Lactic acidosis/Acute kidney injury -Related to volume depletion and likely lack of oral intake and setting of patient receiving antihypertensive medications including ACE inhibitor at discharge -Continue to hold offending medications and given the fact patient is actively dying will not resume these  medications -Since patient previously requested no aggressive therapies will not try to reverse suspected dehydration and resulting acute renal failure -Given degree of ascites and liver involvement from metastatic disease suspect any attempts to treat acute renal failure with volume resuscitation would only lead to increasing ascites and would not solve the problem at hand -As patient's focus appears that on comfort, will not aggressively correct    Liver metastases/Stage IV carcinoma of colon -Have asked palliative medicine to recontact patient and family during this admission -Plan is to pursue placement of patient in hospice appropriate facility -Ex-wife requests oncology input regarding end-of-life care-we'll add Dr. [name] to consultant list -continue to pursue residential hospice bed placement since ex-wife confirms that she is unlikely to be able to manage the patient after discharge given the complexity of his current care -appreciate palliative care--Dr. Deitra Mayo    History of present illness:  59 year old male with history of metastatic colon cancer to the liver, GI bleed presented with acute mental status change. patient recently discharged on 4/23 the diagnosis of end-stage colon cancer with liver metastasis. During that hospitalization he was made a DO NOT RESUSCITATE with no further plans to continue chemotherapy and otherwise was to transition to hospice care. He was discharged home under the care of his ex-wife. Palliative care assisted the patient and family with goals of care and transitioning to hospice while in the hospital. The patient was noted with abrupt decreased level of consciousness alone in a hotel room in [place]. He was awake and confused and unable to contribute any significant history. Evaluation in the ER revealed an afebrile patient was tachycardic with suboptimal blood pressures between 92/72 and 101/85. He is requiring moderate nasal cannula oxygen. CBC is  at baseline when compared to discharge noting patient with leukocytosis and 1300. Platelets are normal and  coags are normal. Electrolyte panel reveals acute renal failure with a sodium of 133, potassium 5.2, BUN 67 and creatinine 3.21. Alkaline phosphatase is 467, AST 103, ammonia 46 and total bilirubin 1.9. Lactic acid also elevated at 6.0. Glucose was 112. Chest x-ray was unremarkable. EKG reveals sinus tachycardia with a left bundle branch block. After admission, the patient was seen by medical oncology,Dr. [name], who agrees with residential hospice. He was also seen by palliative medicine, Dr. Deitra Mayo, who helped with symptom management and facilitate transfer to present to hospice.     The results of significant diagnostics from this hospitalization (including imaging, microbiology, ancillary and laboratory) are listed below for reference.    Significant Diagnostic Studies: US Paracentesis  06/01/2014   INDICATION: Metastatic colon cancer, ascites. Request is made for therapeutic paracentesis up to 4 liters.  EXAM: ULTRASOUND-GUIDED THERAPEUTIC PARACENTESIS  COMPARISON:  None.  MEDICATIONS: None.  COMPLICATIONS: None immediate  TECHNIQUE: Informed written consent was obtained from the patient after a discussion of the risks, benefits and alternatives to treatment. A timeout was performed prior to the initiation of the procedure.  Initial ultrasound scanning demonstrates a moderate tolarge amount of ascites within the right lower abdominal quadrant. The right lower abdomen was prepped and draped in the usual sterile fashion. 1% lidocaine was used for local anesthesia. Under direct ultrasound guidance, a 19 gauge, 10-cm, Yueh catheter was introduced. An ultrasound image was saved for documentation purposed. The paracentesis was performed. The catheter was removed and a dressing was applied. The patient tolerated the procedure well without immediate post procedural complication.  FINDINGS: A total of  approximately 4 liters of yellow fluid was removed.  IMPRESSION: Successful ultrasound-guided therapeutic paracentesis yielding 4 liters of peritoneal fluid.  Read by: Rowe Robert, PA-C   Electronically Signed   By: Marybelle Killings M.D.   On: 05/31/2014 12:59   Dg Chest Portable 1 View  05/29/2014   CLINICAL DATA:  Altered level of consciousness.  EXAM: PORTABLE CHEST - 1 VIEW  COMPARISON:  Chest radiograph and chest CT February 21, 2014  FINDINGS: There is no demonstrable edema or consolidation. 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 There is an aortic stent graft through this region, unchanged from prior study. Heart size is within normal limits. Patient is status post coronary artery bypass grafting. No adenopathy appreciable.  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 No edema or consolidation.   Electronically Signed   By: Lowella Grip III M.D.   On: 05/12/2014 10:40    Microbiology: No results found for this or any previous visit (from the past 240 hour(s)).   Labs: Basic Metabolic Panel:  Recent Labs Lab 06/01/2014 0956  NA 133*  K 5.2*  CL 94*  CO2 21  GLUCOSE 112*  BUN 67*  CREATININE 3.21*  CALCIUM 8.8   Liver Function Tests:  Recent Labs Lab 06/07/2014 0956  AST 103*  ALT 28  ALKPHOS 467*  BILITOT 1.9*  PROT 8.8*  ALBUMIN 2.1*   No results for input(s): LIPASE, AMYLASE in the last 168 hours.  Recent Labs Lab 06/03/2014 0956  AMMONIA 46*   CBC:  Recent Labs Lab 05/13/2014 0956  WBC 20.3*  NEUTROABS 15.3*  HGB 13.1  HCT 39.2  MCV 87.1  PLT 362   Cardiac Enzymes: No results for input(s): CKTOTAL, CKMB, CKMBINDEX, TROPONINI in the last 168 hours. BNP: Invalid input(s): POCBNP CBG: No  results for input(s): GLUCAP in the last 168 hours.  Time: 1937  Signed: Orson Eva, DO [phone] [facility] 07/09/2014, 7:01 PM

## 2014-07-10 NOTE — Progress Notes (Addendum)
Patient unresponsive, With out respirations or pulse.  Patient expired at 1647.  Dr Tat is on the unit and was notified.  Patient's wife was notified as well.

## 2014-07-10 NOTE — Progress Notes (Signed)
Patient [name]      DOB: 11-23-1955      DBZ:[id number]   Palliative Medicine Team at [facility] Progress Note    Subjective: Far less responsive today but appears comfortable. Nonverbal and no purposeful interaction with me.     Filed Vitals:   06/09/14 2052  BP: 104/75  Pulse: 100  Temp: 98.1 F (36.7 C)  Resp: 18   Physical exam: GEN: more lethargic, NAD CV: tachy ABD: distended   Assessment and plan: 59 yo male with metastatic colon CA admitted with AMS, sepsis, AKI, elevated lactate, hypotension.   1. Code Status: DNR documented  2. GOC: Continued decline. Awaiting residential hospice bed. I think he is still appropriate for transfer at this point.   3. Symptom Management:  Cancer Related Pain- appears to be doing better with scheduled morphine. Would continue current regimen.   Urinary Retention- bladder scan with over 660ml and he was I/O cath'd last night. i will place foley for comfort   Doran Clay D.O. Palliative Medicine Team at [facility]  Pager: [phone] Team Phone: [phone]

## 2014-07-10 NOTE — Progress Notes (Signed)
Pt has had no urinary output this shift. Pt has a condom cath,  Bladder scan revealed 680cc of fluid  M.D notified order to in and out prn. In out cath obtained 150cc of amber colored urine. Abdomen remains grossly distended.

## 2014-07-10 NOTE — Progress Notes (Signed)
UR complete.  Yvett Rossel RN, MSN 

## 2014-07-10 NOTE — Consult Note (Signed)
[facility] Liaison: Received request from [facility] for family interest in [facility]. Chart reviewed. Received report from bed side RN Gabe. No family present. Patient appears comfortable. Did not disturb. Will update CSW in am re availability. Thank you. Erling Conte, LCSW

## 2014-07-10 NOTE — Progress Notes (Signed)
Upon RN assessment, patient does not follow commands and is not communicating. Vital signs are stable; breathing unlabored but shallow. Bilateral radial and pedal pulses are weak. Significant other (Marcell) notified of patient condition. She will come to hospital by 1300. RN gave scheduled 2 mg Morphine and held patients hand for 15 minutes to assess breathing status. Will continue to monitor.

## 2014-07-10 NOTE — Clinical Social Work Note (Signed)
Clinical Social Worker has assessed pt and pt's family. Full psychosocial assessment to follow.   Referral has been placed to Harmon Pier, liaison of [facility].   CSW remains available.   Glendon Axe, MSW, LCSWA [phone] 06/22/14 4:03 PM

## 2014-07-10 DEATH — deceased

## 2015-04-25 ENCOUNTER — Other Ambulatory Visit: Payer: Self-pay | Admitting: Nurse Practitioner

## 2016-09-20 IMAGING — RF DG C-ARM 61-120 MIN
1 series · 10 of 10 positions shown · non-contrast
Comparison: CT scan of December 28, 2013.

CLINICAL DATA: Colonic mass.

EXAM:
DG C-ARM 61-120 MIN; ABDOMEN - 2 VIEW

[Series 1: run · 10 of 10 slices shown]
[im 1/10]
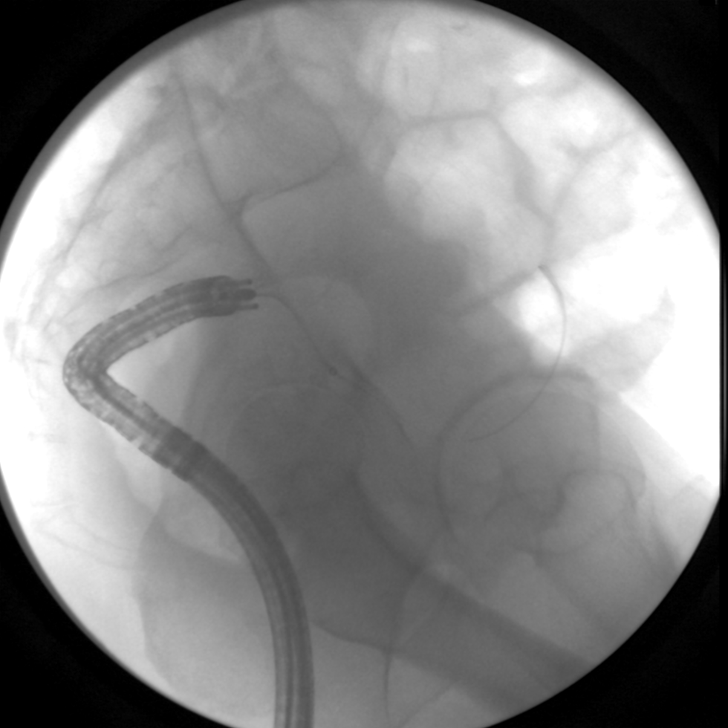
[im 2/10]
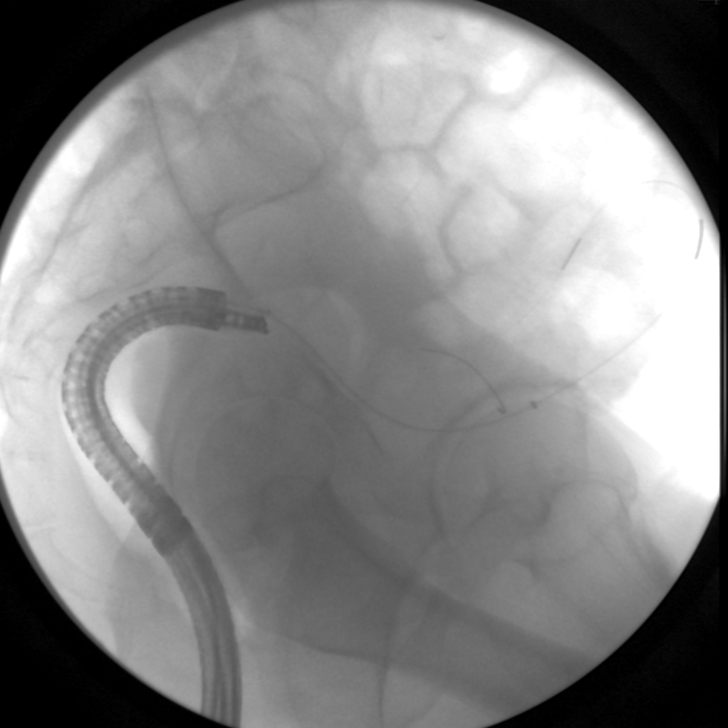
[im 3/10]
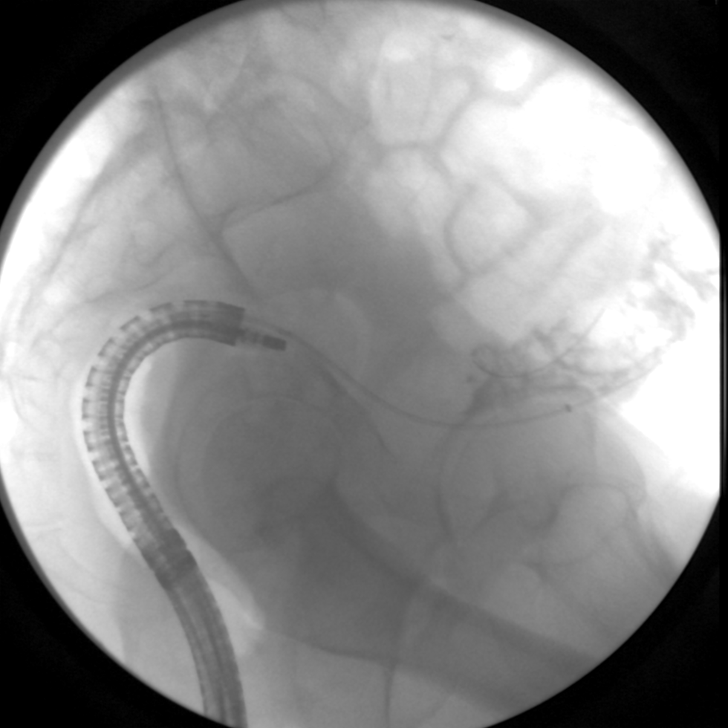
[im 4/10]
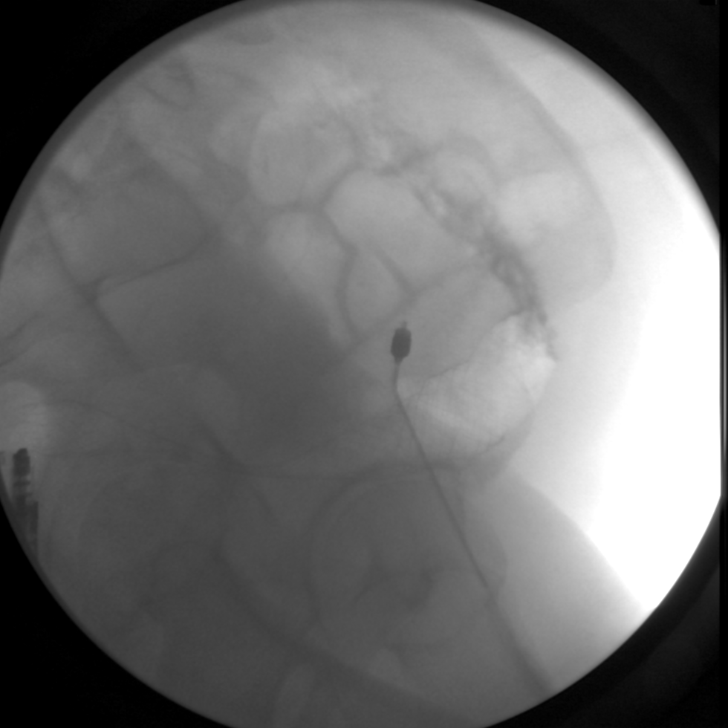
[im 5/10]
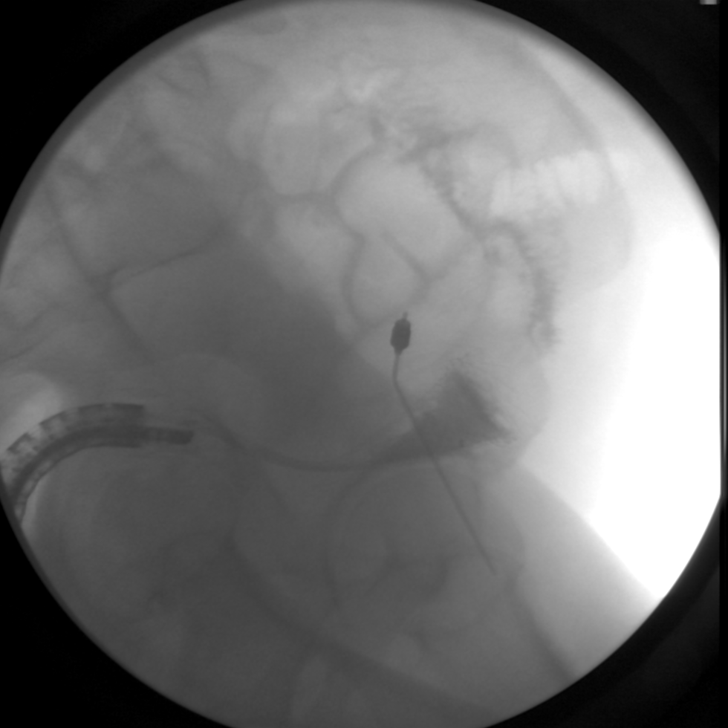
[im 6/10]
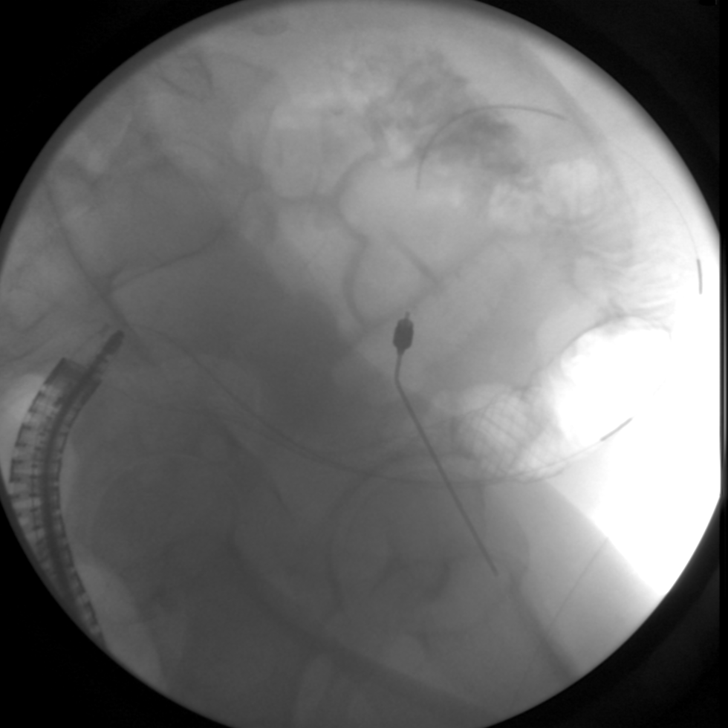
[im 7/10]
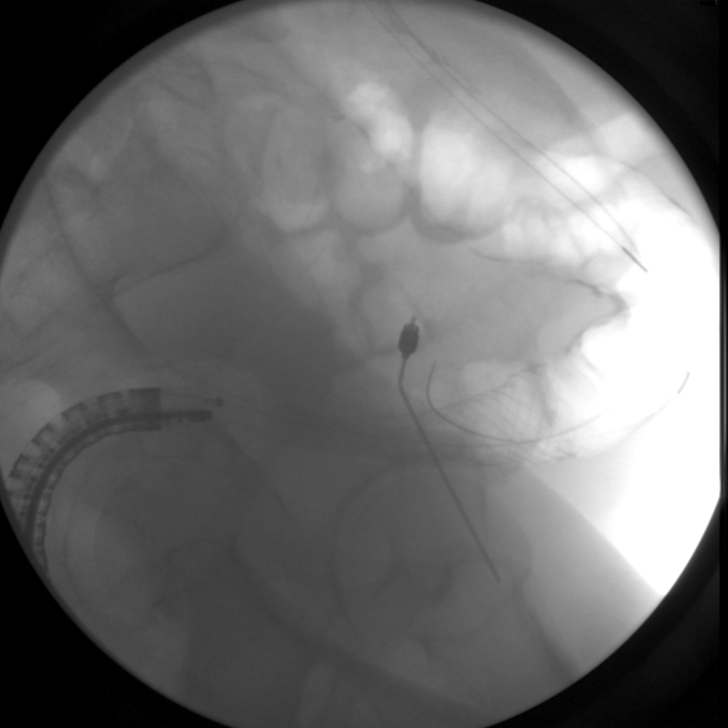
[im 8/10]
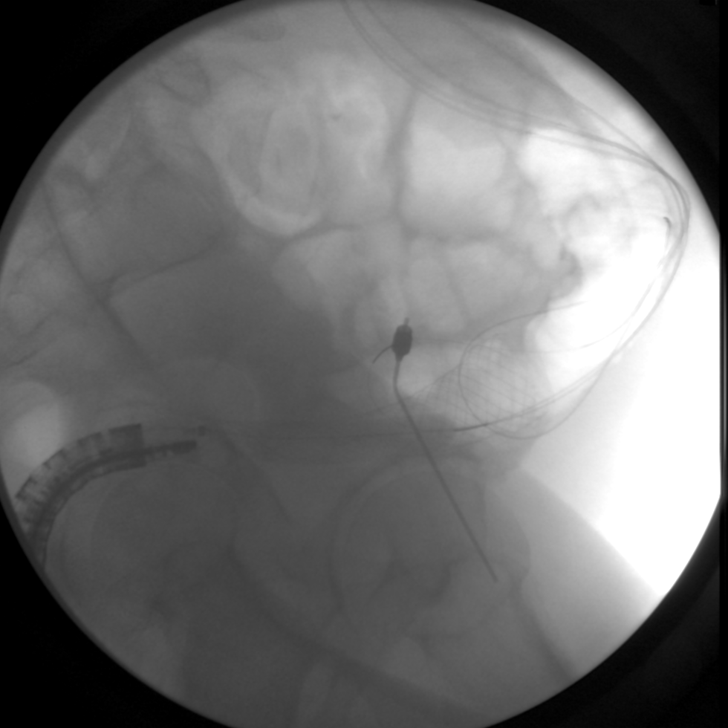
[im 9/10]
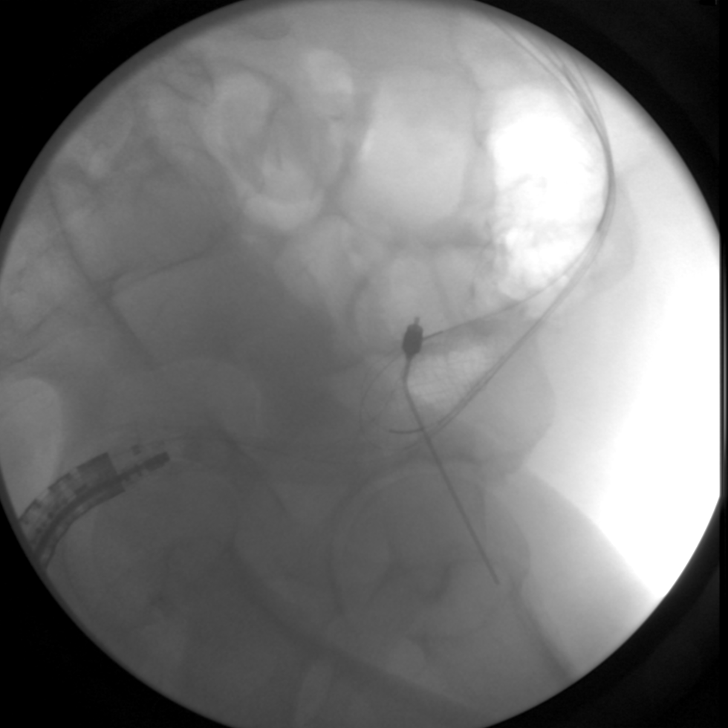
[im 10/10]
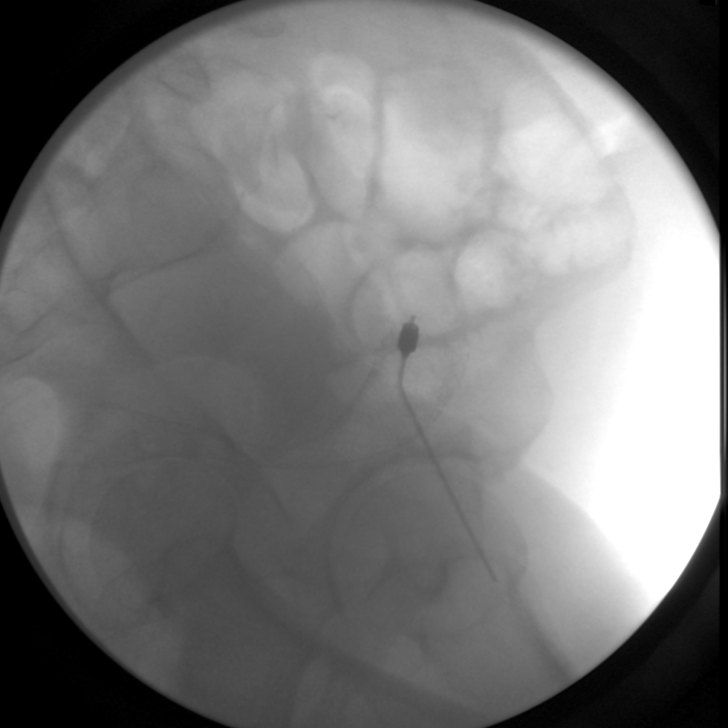

[10 of 10 positions shown; findings below may reference images not displayed]

FINDINGS: Ten fluoroscopic images were obtained during stent placement through
colonic mass via colonoscopy.
IMPRESSION: Stent placement for treatment of colonic mass.

## 2017-02-17 IMAGING — US US PARACENTESIS
1 series · 5 of 5 positions shown · non-contrast
Comparison: None.

MEDICATIONS:
None.

COMPLICATIONS:
None immediate

INDICATION: Metastatic colon cancer, ascites. Request is made for therapeutic
paracentesis up to 4 liters.

EXAM:
ULTRASOUND-GUIDED THERAPEUTIC PARACENTESIS
TECHNIQUE: Informed written consent was obtained from the patient after a
discussion of the risks, benefits and alternatives to treatment. A
timeout was performed prior to the initiation of the procedure.

[Series 1: us paracentesis · 0.27mm/px · 5 of 5 slices shown]
[im 1/5]
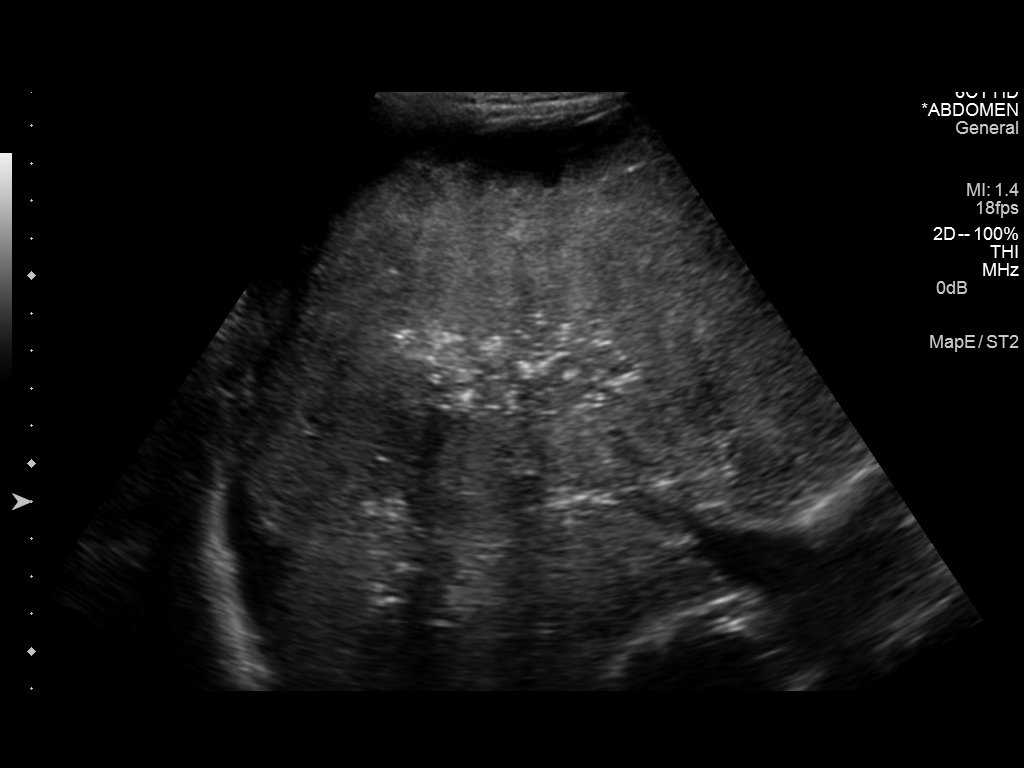
[im 2/5]
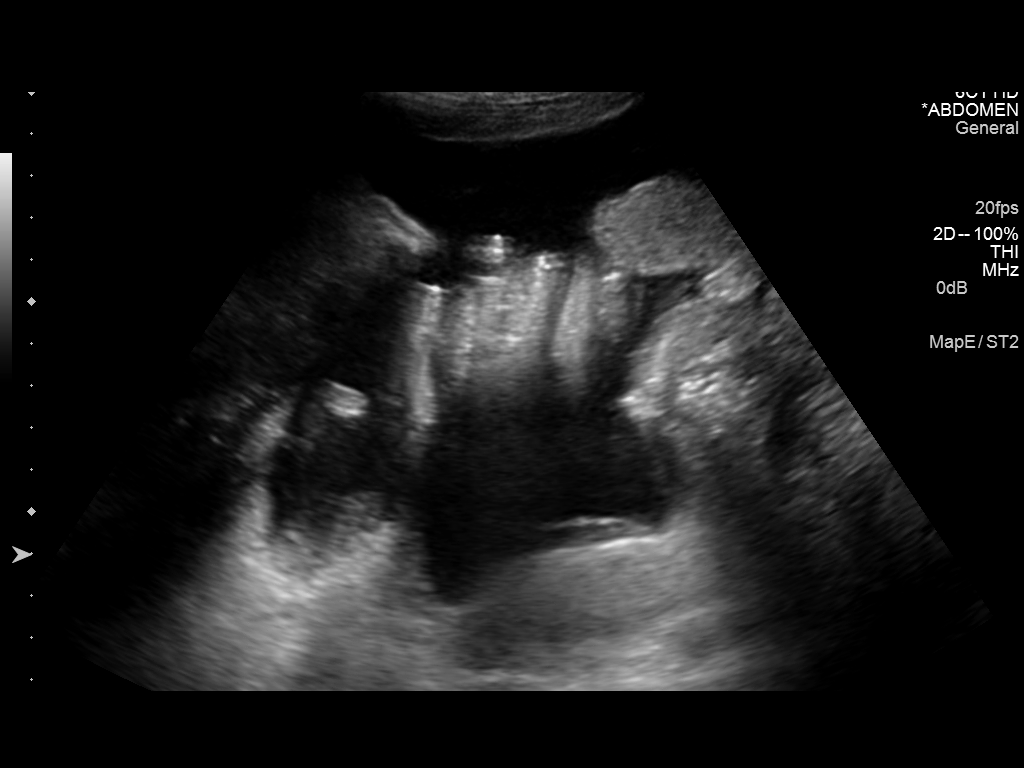
[im 3/5]
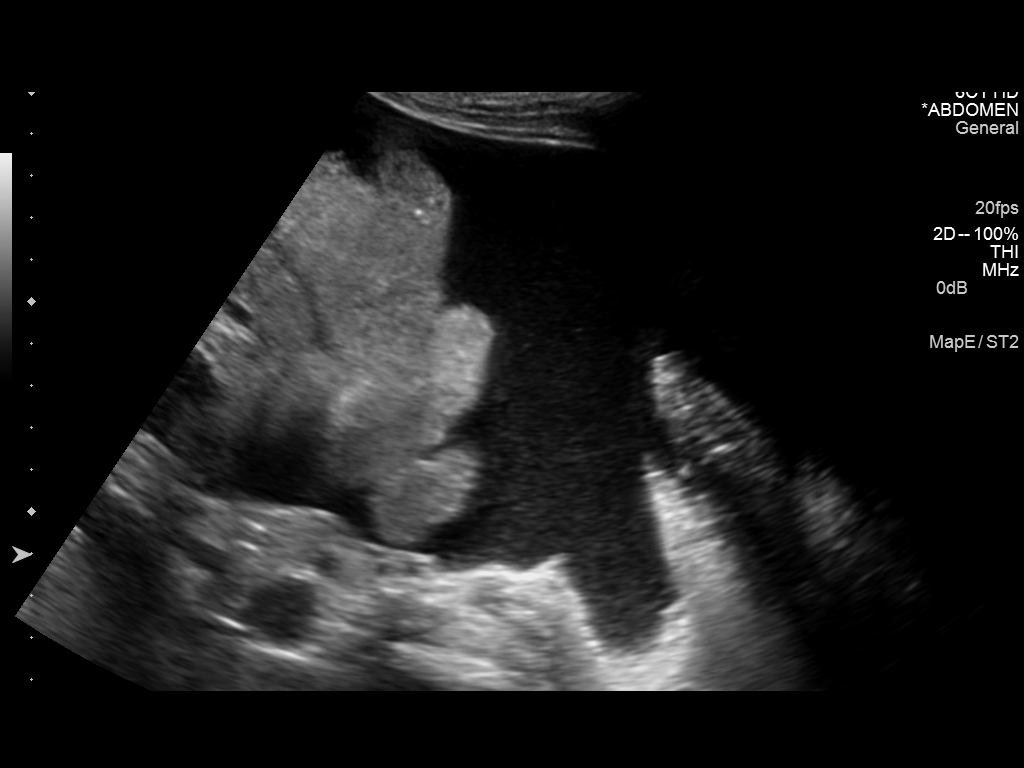
[im 4/5]
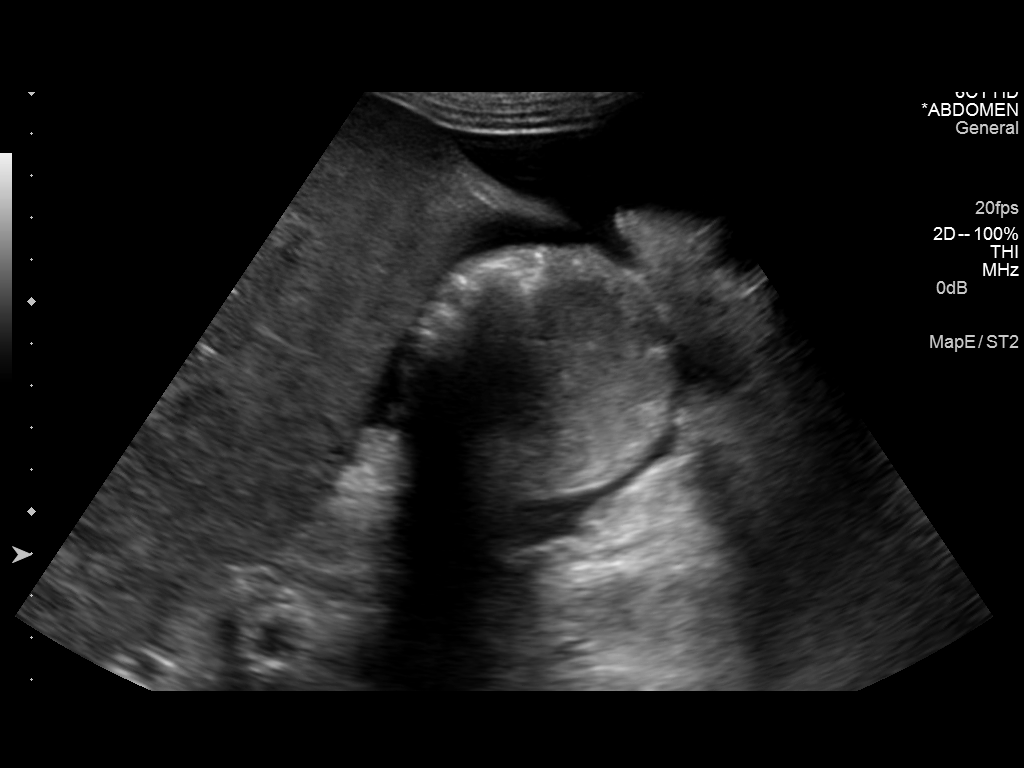
[im 5/5]
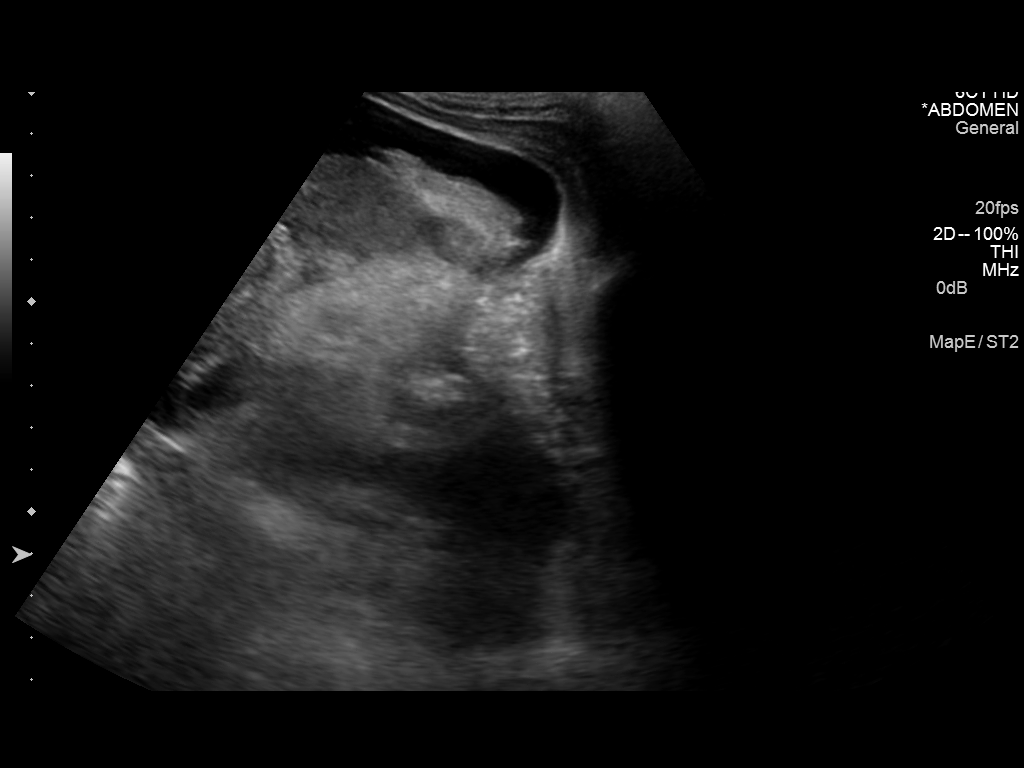

[5 of 5 positions shown; findings below may reference images not displayed]

Initial ultrasound scanning demonstrates a moderate tolarge amount
of ascites within the right lower abdominal quadrant. The right
lower abdomen was prepped and draped in the usual sterile fashion.
1% lidocaine was used for local anesthesia. Under direct ultrasound
guidance, a 19 gauge, 10-cm, Yueh catheter was introduced. An
ultrasound image was saved for documentation purposed. The
paracentesis was performed. The catheter was removed and a dressing
was applied. The patient tolerated the procedure well without
immediate post procedural complication.
FINDINGS: A total of approximately 4 liters of yellow fluid was removed.
IMPRESSION: Successful ultrasound-guided therapeutic paracentesis yielding 4
liters of peritoneal fluid.
